# Patient Record
Sex: Male | Born: 1950
Health system: Southern US, Community
[De-identification: ages and names within clinical notes are randomized; demographics above are authoritative.]

## PROBLEM LIST (undated history)

## (undated) DIAGNOSIS — IMO0002 Reserved for concepts with insufficient information to code with codable children: Secondary | ICD-10-CM

## (undated) DIAGNOSIS — H9191 Unspecified hearing loss, right ear: Secondary | ICD-10-CM

## (undated) DIAGNOSIS — E119 Type 2 diabetes mellitus without complications: Secondary | ICD-10-CM

## (undated) DIAGNOSIS — I10 Essential (primary) hypertension: Secondary | ICD-10-CM

## (undated) DIAGNOSIS — J309 Allergic rhinitis, unspecified: Secondary | ICD-10-CM

## (undated) DIAGNOSIS — M25561 Pain in right knee: Secondary | ICD-10-CM

## (undated) DIAGNOSIS — E041 Nontoxic single thyroid nodule: Secondary | ICD-10-CM

## (undated) DIAGNOSIS — K219 Gastro-esophageal reflux disease without esophagitis: Secondary | ICD-10-CM

## (undated) DIAGNOSIS — Z9289 Personal history of other medical treatment: Secondary | ICD-10-CM

## (undated) DIAGNOSIS — M25562 Pain in left knee: Secondary | ICD-10-CM

## (undated) DIAGNOSIS — M545 Low back pain, unspecified: Secondary | ICD-10-CM

## (undated) DIAGNOSIS — I720 Aneurysm of carotid artery: Secondary | ICD-10-CM

## (undated) DIAGNOSIS — G4733 Obstructive sleep apnea (adult) (pediatric): Secondary | ICD-10-CM

## (undated) HISTORY — DX: Reserved for concepts with insufficient information to code with codable children: IMO0002

## (undated) HISTORY — PX: OTHER SURGICAL HISTORY: SHX169

## (undated) HISTORY — PX: KNEE ARTHROSCOPY: SUR90

## (undated) HISTORY — DX: Type 2 diabetes mellitus without complications: E11.9

## (undated) HISTORY — DX: Nontoxic single thyroid nodule: E04.1

## (undated) HISTORY — DX: Allergic rhinitis, unspecified: J30.9

## (undated) HISTORY — PX: CARDIAC CATHETERIZATION: SHX172

## (undated) HISTORY — DX: Morbid (severe) obesity due to excess calories: E66.01

## (undated) HISTORY — DX: Essential (primary) hypertension: I10

## (undated) HISTORY — DX: Low back pain: M54.5

## (undated) HISTORY — PX: APPENDECTOMY: SHX54

## (undated) HISTORY — PX: NASAL SEPTOPLASTY W/ TURBINOPLASTY: SHX2070

## (undated) HISTORY — DX: Personal history of other medical treatment: Z92.89

## (undated) HISTORY — DX: Pain in right knee: M25.562

## (undated) HISTORY — DX: Pain in right knee: M25.561

## (undated) HISTORY — DX: Low back pain, unspecified: M54.50

## (undated) HISTORY — DX: Obstructive sleep apnea (adult) (pediatric): G47.33

---

## 1998-04-29 ENCOUNTER — Ambulatory Visit (HOSPITAL_BASED_OUTPATIENT_CLINIC_OR_DEPARTMENT_OTHER): Admission: RE | Admit: 1998-04-29 | Discharge: 1998-04-29 | Payer: Self-pay | Admitting: Orthopedic Surgery

## 2000-05-13 ENCOUNTER — Encounter (INDEPENDENT_AMBULATORY_CARE_PROVIDER_SITE_OTHER): Payer: Self-pay | Admitting: Specialist

## 2000-05-13 ENCOUNTER — Other Ambulatory Visit: Admission: RE | Admit: 2000-05-13 | Discharge: 2000-05-13 | Payer: Self-pay | Admitting: Gastroenterology

## 2000-11-21 ENCOUNTER — Ambulatory Visit (HOSPITAL_BASED_OUTPATIENT_CLINIC_OR_DEPARTMENT_OTHER): Admission: RE | Admit: 2000-11-21 | Discharge: 2000-11-21 | Payer: Self-pay | Admitting: Internal Medicine

## 2001-04-06 ENCOUNTER — Emergency Department (HOSPITAL_COMMUNITY): Admission: EM | Admit: 2001-04-06 | Discharge: 2001-04-06 | Payer: Self-pay | Admitting: *Deleted

## 2002-08-21 ENCOUNTER — Ambulatory Visit (HOSPITAL_COMMUNITY): Admission: RE | Admit: 2002-08-21 | Discharge: 2002-08-21 | Payer: Self-pay | Admitting: Cardiology

## 2002-08-21 ENCOUNTER — Encounter: Payer: Self-pay | Admitting: Cardiology

## 2005-03-11 ENCOUNTER — Ambulatory Visit: Payer: Self-pay | Admitting: Internal Medicine

## 2005-08-06 ENCOUNTER — Ambulatory Visit: Payer: Self-pay | Admitting: Orthopedic Surgery

## 2005-12-09 ENCOUNTER — Ambulatory Visit: Payer: Self-pay | Admitting: Orthopedic Surgery

## 2005-12-18 ENCOUNTER — Ambulatory Visit: Payer: Self-pay | Admitting: Orthopedic Surgery

## 2005-12-18 ENCOUNTER — Ambulatory Visit (HOSPITAL_COMMUNITY): Admission: RE | Admit: 2005-12-18 | Discharge: 2005-12-18 | Payer: Self-pay | Admitting: Orthopedic Surgery

## 2005-12-21 ENCOUNTER — Ambulatory Visit: Payer: Self-pay | Admitting: Orthopedic Surgery

## 2005-12-22 ENCOUNTER — Encounter (HOSPITAL_COMMUNITY): Admission: RE | Admit: 2005-12-22 | Discharge: 2006-01-21 | Payer: Self-pay | Admitting: Orthopedic Surgery

## 2006-01-11 ENCOUNTER — Ambulatory Visit: Payer: Self-pay | Admitting: Orthopedic Surgery

## 2006-03-03 ENCOUNTER — Ambulatory Visit: Payer: Self-pay | Admitting: Internal Medicine

## 2006-04-07 ENCOUNTER — Ambulatory Visit: Payer: Self-pay | Admitting: Orthopedic Surgery

## 2006-06-28 ENCOUNTER — Ambulatory Visit: Payer: Self-pay | Admitting: Orthopedic Surgery

## 2007-06-28 ENCOUNTER — Ambulatory Visit: Payer: Self-pay | Admitting: Internal Medicine

## 2007-12-14 ENCOUNTER — Ambulatory Visit (HOSPITAL_COMMUNITY): Admission: RE | Admit: 2007-12-14 | Discharge: 2007-12-14 | Payer: Self-pay | Admitting: Family Medicine

## 2008-04-30 ENCOUNTER — Telehealth (INDEPENDENT_AMBULATORY_CARE_PROVIDER_SITE_OTHER): Payer: Self-pay | Admitting: *Deleted

## 2008-06-25 DIAGNOSIS — G4733 Obstructive sleep apnea (adult) (pediatric): Secondary | ICD-10-CM | POA: Insufficient documentation

## 2008-06-25 DIAGNOSIS — J309 Allergic rhinitis, unspecified: Secondary | ICD-10-CM

## 2008-06-25 DIAGNOSIS — I10 Essential (primary) hypertension: Secondary | ICD-10-CM | POA: Insufficient documentation

## 2008-06-25 DIAGNOSIS — M549 Dorsalgia, unspecified: Secondary | ICD-10-CM | POA: Insufficient documentation

## 2008-06-26 ENCOUNTER — Ambulatory Visit: Payer: Self-pay | Admitting: Internal Medicine

## 2008-06-26 DIAGNOSIS — E119 Type 2 diabetes mellitus without complications: Secondary | ICD-10-CM

## 2008-06-26 HISTORY — DX: Type 2 diabetes mellitus without complications: E11.9

## 2008-08-30 ENCOUNTER — Ambulatory Visit (HOSPITAL_COMMUNITY): Admission: RE | Admit: 2008-08-30 | Discharge: 2008-08-30 | Payer: Self-pay | Admitting: Family Medicine

## 2009-04-09 ENCOUNTER — Ambulatory Visit (HOSPITAL_COMMUNITY): Admission: RE | Admit: 2009-04-09 | Discharge: 2009-04-09 | Payer: Self-pay | Admitting: General Surgery

## 2009-06-25 ENCOUNTER — Ambulatory Visit: Payer: Self-pay | Admitting: Internal Medicine

## 2009-07-24 ENCOUNTER — Encounter: Payer: Self-pay | Admitting: Orthopedic Surgery

## 2009-09-18 ENCOUNTER — Ambulatory Visit (HOSPITAL_COMMUNITY): Admission: RE | Admit: 2009-09-18 | Discharge: 2009-09-18 | Payer: Self-pay | Admitting: Family Medicine

## 2009-10-25 ENCOUNTER — Ambulatory Visit (HOSPITAL_COMMUNITY): Admission: RE | Admit: 2009-10-25 | Discharge: 2009-10-25 | Payer: Self-pay | Admitting: Family Medicine

## 2010-07-15 ENCOUNTER — Telehealth: Payer: Self-pay | Admitting: Internal Medicine

## 2010-07-29 ENCOUNTER — Ambulatory Visit: Payer: Self-pay | Admitting: Internal Medicine

## 2010-10-08 ENCOUNTER — Encounter: Payer: Self-pay | Admitting: Internal Medicine

## 2010-12-07 ENCOUNTER — Encounter: Payer: Self-pay | Admitting: Family Medicine

## 2010-12-18 NOTE — Assessment & Plan Note (Signed)
Summary: yearly/apc   Primary Provider/Referring Provider:  Joeseph Amor / [place]  CC:  yearly follow up visit-OSA "doing good"..  History of Present Illness: History of Present Illness: 06/26/08- 60 year old, morbidly obese man followed for allergic rhinitis, and obstructive sleep apnea.  Physically, limited by chronic back pain.  Allergy problems have been well controlled through this spring and summer so far.  He uses Nasalcrom everyday and misses. it if he runs out. CPAP is comfortable at 15 CW PE, but he asks about increasing the pressure.  Feels tired and somewhat short of breath through the day, but is told that he is not snoring.  06/25/09- Allergic rhinitis, OSA,  CPAP at 16 works well, used all night every night. Denies daytime sleepiness as long as he stays active. Sometimes difficult getting to sleep at night. Wife died of pulmonary fibrosis. He's trying a glass of wine at bedtime. We discussed alcohlol for sleep- he says he sleeps through the night. He lost some weight while she was sick, but has regained it. Colonoscopy this year uncomplicated. Continues nasalcrom nasal spray most days. Denies hives or dry cough pertinent to ACEI use.  July 29, 2010- Allergic rhinitis, OSA, DM Continues CPAP all night every night at 16 and with any naps. Noticed increased tiredness and discovered hose leak- not holding pressure. With that repaired he is back to sleeping well with less daytime tiredness. Has not lost weight. Rhinitis controlled with regular use of Nasalcrom and it has been sufficient over the last 5-7 years.    While here he felt nervous. Nurse checked CBG 54>> given grape juice with relief.   Preventive Screening-Counseling & Management  Alcohol-Tobacco     Smoking Status: never     Passive Smoke Exposure: yes  Current Medications (verified): 1)  Bayer Aspirin 325 Mg  Tabs (Aspirin) .... Take 1 By Mouth Once Daily 2)  Cpap .... Set At 16 (Ahc) 3)  Benazepril Hcl  40 Mg  Tabs (Benazepril Hcl) .... Take 1 By Mouth Once Daily 4)  Hydrochlorothiazide 25 Mg  Tabs (Hydrochlorothiazide) .... Take 1 By Mouth Once Daily 5)  Catapres 0.2 Mg  Tabs (Clonidine Hcl) .... Take 1 By Mouth Three Times A Day 6)  Norvasc 10 Mg  Tabs (Amlodipine Besylate) .... Take 1 By Mouth Once Daily 7)  Toprol Xl 50 Mg Xr24h-Tab (Metoprolol Succinate) .... Take 1 By Mouth Once Daily 8)  Glucotrol 10 Mg  Tabs (Glipizide) .... Take 1 By Mouth Two Times A Day 9)  Metformin Hcl 500 Mg  Tabs (Metformin Hcl) .... Take 2 By Mouth Two Times A Day 10)  Lantus 100 Unit/ml  Soln (Insulin Glargine) .... Inject 62 Units At Bedtime 11)  Xalatan 0.005 %  Soln (Latanoprost) .... Use As Directed 12)  Nasalcrom 5.2 Mg/act  Aers (Cromolyn Sodium) .... Use As Directed 13)  Hydrocodone-Acetaminophen 5-500 Mg  Tabs (Hydrocodone-Acetaminophen) .... Take As Directed As Needed 14)  Flomax 0.4 Mg  Xr24h-Cap (Tamsulosin Hcl) .... Take 1 By Mouth Once Daily  Allergies (verified): 1)  ! Sulfa  Past History:  Past Medical History: Last updated: 06/26/2008 Allergic Rhinitis Diabetes Hypertension Sleep Apnea Degenerative disk disease Morbid obesity  Past Surgical History: Last updated: 06/25/2009 nasal septoplasty Arthroscopy both knees Appendectomy  Family History: Last updated: 06/26/2008 Mother- living age 91;DM Father- deceased age 48; lung disease;DM Sister- living age 7;DM  Social History: Last updated: 06/25/2009 Non-Smoker Positive history of passive tobacco smoke exposure.  No exercise. Caffeine-1 cup day Widower  with 2 children  Risk Factors: Smoking Status: never (07/29/2010) Passive Smoke Exposure: yes (07/29/2010)  Social History: Smoking Status:  never  Review of Systems      See HPI  The patient denies anorexia, fever, weight loss, weight gain, vision loss, decreased hearing, hoarseness, chest pain, syncope, dyspnea on exertion, peripheral edema, prolonged cough,  headaches, hemoptysis, abdominal pain, melena, severe indigestion/heartburn, muscle weakness, suspicious skin lesions, and difficulty walking.    Vital Signs:  Patient profile:   60 year old male Weight:      344.38 pounds O2 Sat:      91 % on Room air Pulse rate:   92 / minute BP sitting:   150 / 88  (right arm) Cuff size:   large  Vitals Entered By: Clayborne Dana CMA (July 29, 2010 4:46 PM)  O2 Flow:  Room air CC: yearly follow up visit-OSA "doing good".   Physical Exam  Additional Exam:  General: A/Ox3; pleasant and cooperative, NAD, obese, sprawling SKIN: no rash, lesions NODES: no lymphadenopathy HEENT: [place]/AT, EOM- WNL, Conjuctivae- clear, PERRLA, TM-WNL, Nose- clear, Throat- clear and wnl, chewing gum. Mallampati IV NECK: Supple w/ fair ROM, JVD- none, normal carotid impulses w/o bruits Thyroid- normal to palpation CHEST: Clear to P&A HEART: RRR, no m/g/r heard ABDOMEN: Morbid obesity  AUQ:JFHL, nl pulses, no edema  NEURO: Grossly intact to observation      Impression & Recommendations:  Problem # 1:  OBSTRUCTIVE SLEEP APNEA (ICD-327.23)  Good compliance and symptom control with CPAP.  Weight  loss would obviously help, but prospects for this aren't promising. We will counsel, and continue CPAP at 16.  Problem # 2:  ALLERGIC RHINITIS (ICD-477.9)  Nasalcrom has been successful and sufficient. No seasonal flare noted so far.  His updated medication list for this problem includes:    Nasalcrom 5.2 Mg/act Aers (Cromolyn sodium) ..... Use as directed  Problem # 3:  DIABETES MELLITUS (ICD-250.00) Acute problem: Incidental hypoglycemia today as noted, CBG 54 with subjective nervousness relieved  by grape juice. He will get something to eat on way home.  His updated medication list for this problem includes:    Bayer Aspirin 325 Mg Tabs (Aspirin) .Marland Kitchen... Take 1 by mouth once daily    Glucotrol 10 Mg Tabs (Glipizide) .Marland Kitchen... Take 1 by mouth two times a day     Metformin Hcl 500 Mg Tabs (Metformin hcl) .Marland Kitchen... Take 2 by mouth two times a day    Lantus 100 Unit/ml Soln (Insulin glargine) ..... Inject 62 units at bedtime  Medications Added to Medication List This Visit: 1)  Toprol Xl 50 Mg Xr24h-tab (Metoprolol succinate) .... Take 1 by mouth once daily 2)  Lantus 100 Unit/ml Soln (Insulin glargine) .... Inject 62 units at bedtime  Other Orders: Est. Patient Level IV (45625)  Patient Instructions: 1)  Please schedule a follow-up appointment in 1 year. 2)  Be sure to get something to eat on your way home and be careful driving.Marland Kitchen 3)  Continue CPAP at 16- you have done well with this. Any weight loss would help your back, your sleep apnea and your diabetes.  4)  Flu vax 5)  CC DR Armandina Gemma

## 2010-12-18 NOTE — Letter (Signed)
Summary: Statement of Medical Necessity / [facility]  Statement of Medical Necessity / [facility]   Imported By: Rise Patience 10/16/2010 15:41:59  _____________________________________________________________________  External Attachment:    Type:   Image     Comment:   External Document

## 2010-12-18 NOTE — Progress Notes (Signed)
Summary: cpap supplies-ATC NA X 1  Phone Note Call from Patient Call back at Home Phone [phone]   Caller: Patient Call For: young Reason for Call: Talk to Nurse Summary of Call: pt needs a new hose for his cpap machine.  [facility] Initial call taken by: Zigmund Gottron,  July 15, 2010 3:39 PM  Follow-up for Phone Call        ATC pt- NA and no option to leave a msg, WCB. Tilden Dome  July 15, 2010 3:43 PM  Spoke with pt, he states that his cpap hose is leaking.  Requests order for a new one sent to [facility].  Has pending ov with CDY on 07/29/10.  Pls advise thanks Follow-up by: Tilden Dome,  July 15, 2010 3:52 PM  Additional Follow-up for Phone Call Additional follow up Details #1::        Called [place] and [facility] can provide cpap hose today, however, pt stated that he wanted to go tomorrow morning. Order faxed to [address] Pt advised to bring cpap in so that RT can check cpap to ensure that the hose is the problem. Pt aware. Rhonda Cobb  July 15, 2010 4:49 PM      Appended Document: Orders Update Will order CPAP hose  Order faxed to [facility] for CPAP Hose. Pt is aware of order. Phillips Grout  July 16, 2010 11:58 AM  Clinical Lists Changes  Orders: Added new Referral order of DME Referral (DME) - Signed

## 2011-02-02 DIAGNOSIS — Z9289 Personal history of other medical treatment: Secondary | ICD-10-CM

## 2011-02-02 HISTORY — DX: Personal history of other medical treatment: Z92.89

## 2011-03-31 NOTE — Assessment & Plan Note (Signed)
Colton Jackson                             PULMONARY OFFICE NOTE   NAME:Colton Jackson, Colton Jackson                       MRN:          [id number]  DATE:06/28/2007                            DOB:          31-Jan-1951    PROBLEM LIST:  1. Obstructive sleep apnea.  2. Morbid obesity.  3. Allergic rhinitis.  4. Diabetes.  5. Chronic back pain.  6. Hypertension.   HISTORY:  Wife comes with him and says he occasionally snores through  his CPAP 15-CWP but overall does pretty well.  He prefers Nasalcrom over  the counter to the nasal steroid sprays.  He is not having any cough  problems using benazepril.  And, he feels stable.   MEDICATIONS:  1. CPAP 15-CWP.  2. Aspirin 325 mg.  3. Benazepril 40 mg.  4. HCTZ 25 mg.  5. Clonidine 0.2 mg t.i.d.  6. Norvasc 10 mg.  7. Toprol XL.  8. Avandia 8 mg.  9. Glipizide ER 10 mg b.i.d.  10.Metformin 500 mg x2 b.i.d.  11.Lantus.  12.Xalatan eye drops.  13.Nasalcrom.  14.Hydrocodone occasional.   DRUG INTOLERANT:  SULFA DRUGS.   OBJECTIVE:  VITAL SIGNS:  Weight 353 pounds, BP 138/72, pulse 72, room  air saturation 95%.  GENERAL:  He is morbidly obese.  CHEST:  Clear.  There is no stridor.  HEART:  Sounds are regular without murmur.   IMPRESSION:  1. Obstructive sleep apnea adequately controlled with CPAP at 15-CWP      but he would do enormously better if he could loose weight.  2. He does have some rhinitis and Nasalcrom seems adequate.   PLAN:  1. CPAP supplies were renewed.  He will continue pressure at 15.  2. Schedule return 1 year, earlier p.r.n.     Clinton D. Annamaria Boots, MD, [facility] Flood, FACP  Electronically Signed    CDY/MedQ  DD: 07/02/2007  DT: 07/03/2007  Job #: [id number]   cc:   Bonne Dolores, M.D.

## 2011-03-31 NOTE — H&P (Signed)
NAME:  Colton Jackson, Colton Jackson                ACCOUNT NO.:  [id number]   MEDICAL RECORD NO.:  [id number]           PATIENT TYPE:  AMB   LOCATION:  DAY                           FACILITY:  [facility]   PHYSICIAN:  Jamesetta So, M.D.  DATE OF BIRTH:  06-18-1951   DATE OF ADMISSION:  DATE OF DISCHARGE:  LH                              HISTORY & PHYSICAL   CHIEF COMPLAINT:  Need for screening colonoscopy.   HISTORY OF PRESENT ILLNESS:  The patient is a 60 year old black male who  is referred for endoscopic evaluation.  He needs colonoscopy for  screening purposes.  No abdominal pain, weight loss, nausea, vomiting,  diarrhea, constipation, melena, or hematochezia have been noted.  He has  never had a colonoscopy.  There is no family history of colon carcinoma.   PAST MEDICAL HISTORY:  Hypertension, insulin-dependent diabetes  mellitus, and sleep apnea.   PAST SURGICAL HISTORY:  Unremarkable.   CURRENT MEDICATIONS:  A blood pressure pill, insulin, and CPAP machine.   ALLERGIES:  No known drug allergies.   REVIEW OF SYSTEMS:  Noncontributory.   PHYSICAL EXAMINATION:  GENERAL:  The patient is a well-developed, well-  nourished black male in no acute distress.  LUNGS:  Clear to auscultation with equal breath sounds bilaterally.  HEART:  Regular rate and rhythm without S3, S4, murmurs.  ABDOMEN:  Soft, nontender and nondistended.  No hepatosplenomegaly or  masses are noted.  RECTAL:  Deferred to the procedure.   IMPRESSION:  Need for screening colonoscopy.   PLAN:  The patient is scheduled for a colonoscopy on Apr 09, 2009.  The  risks and benefits of the procedure including bleeding and perforation  were fully explained to the patient, I gave informed consent.      Jamesetta So, M.D.  Electronically Signed     MAJ/MEDQ  D:  04/04/2009  T:  04/05/2009  Job:  [id number]   cc:   Bonne Dolores, M.D.  Fax: [phone]   Short Stay, [facility]

## 2011-04-03 NOTE — Op Note (Signed)
NAME:  Colton Jackson, Colton Jackson                ACCOUNT NO.:  [id number]   MEDICAL RECORD NO.:  [id number]          PATIENT TYPE:  AMB   LOCATION:  DAY                           FACILITY:  [facility]   PHYSICIAN:  Carole Civil, M.D.DATE OF BIRTH:  1951-11-11   DATE OF PROCEDURE:  12/18/2005  DATE OF DISCHARGE:                                 OPERATIVE REPORT   INDICATIONS:  A 60 year old male with right knee pain, complains of giving  away of the knee with medial compartment pain.  Presents for arthroscopy,  medial meniscectomy.  Understands the risks and benefits of the procedure  including the option of not doing any surgery.   PREOPERATIVE DIAGNOSIS:  Torn medial meniscus, osteoarthritis of the right  knee.   POSTOPERATIVE DIAGNOSIS:  Torn medial meniscus, osteoarthritis of the right  knee.   PROCEDURES:  1.  Arthroscopy, right knee.  2.  Partial medial meniscectomy.   OPERATIVE FINDINGS:  Torn medial meniscus.  Mild degenerative changes  throughout the knee, grade 1.   SURGEON:  Carole Civil, M.D., no assistants.   ANESTHETIC:  Spinal.   No specimens.  No pathology.  No blood loss.  No complications.  Counts  reported as correct per nurse.  The patient went to PACU after the procedure  in good condition.   [name] was identified in preop holding area as Colton Jackson.  His  right knee was marked for surgery, countersigned by the surgeon.  His  physical was updated.  Antibiotics were started.  He is taken to the  operating room for spinal anesthetic.  After successful spinal anesthesia,  his right leg was prepped with sterile technique, after which a time-out was  taken and completed as required.   A two-incision arthroscopy technique was used.  A diagnostic arthroscopy was  performed.  The patellofemoral compartment looked reasonably good, as did  the lateral compartment.  There was some softening and fibrillation of the  cartilage in both compartments, but no loose  bodies were found noted.  No  denuded cartilage was noted.   On the medial compartment there was a tear of the posterior horn of the  medial meniscus.  Mild skin changes, grade 1, of the tibial plateau and  medial femoral condyle.   Using combination of duckbill forceps and motorized shaver and a 50-degree  ArthroCare wand, the meniscus tear was resected and balanced until a stable  rim was noted.   The knee was irrigated.  Its portals were closed with the 3-0 nylon and the  knee was injected with 15 mL of Marcaine.  A sterile dressing and Cryo/Cuff  were applied.  The patient was taken to the recovery room in stable  condition.   The patient will start therapy on Tuesday, follow with me on Monday.  He is  discharged on Lorcet Plus one q4.h., #60, two refills, and he is also put on  Coumadin.  I did not use Lovenox because he had a spinal.  He is on Coumadin  5 mg today, 2.5 milligrams one a day starting Saturday, December 19, 2005,  and continuing for 14 days.      Carole Civil, M.D.  Electronically Signed     SEH/MEDQ  D:  12/18/2005  T:  12/18/2005  Job:  [id number]

## 2011-04-03 NOTE — Cardiovascular Report (Signed)
NAME:  EVERSON, MOTT NO.:  [id number]   MEDICAL RECORD NO.:  [id number]                   PATIENT TYPE:  OIB   LOCATION:  2899                                 FACILITY:  [facility]   PHYSICIAN:  Bryson Dames, M.D.             DATE OF BIRTH:  July 01, 1951   DATE OF PROCEDURE:  08/21/2002  DATE OF DISCHARGE:  08/21/2002                              CARDIAC CATHETERIZATION   PROCEDURES PERFORMED:  1. Selective coronary angiography by Judkins technique.  2. Retrograde left heart catheterization.  3. Left ventricular angiography.  4. Abdominal aortography.  5. Right heart catheterization with thermodilution cardiac output     determinations.   COMPLICATIONS:  None.   ENTRY SITE:  Right femoral.   DYE USED:  Omnipaque.   PATIENT PROFILE:  The patient is a 60 year old African American, patient of  Drs. Bradsher and Cresenzo in [place], [place], who is obese has  diabetes, obstructive sleep apnea and recent symptoms suggestive of coronary  disease.  A Cardiolite stress test performed July 04, 2002, showed a left  ventricular ejection fraction of 51% with sole inferior and lateral wall  ischemia.  Cardiac catheterization was recommended and the patient presented  here today on an outpatient basis elective to [facility] short-stay  unit.   RESULTS:  PRESSURES:  The right atrial mean pressure was 19, right ventricle  pressure was 00/86, end-diastolic pressure 24, pulmonary artery pressure was  60/30, mean of 35, pulmonary capillary wedge mean pressure was 23.  Cardiac  output 9.3, cardiac index 3.6.  Central aorta arterial saturation 94%,  pulmonary artery arterial saturation 73%.  Central aortic pressure was  155/95, mean of 125, left ventricular pressure 761/95, end-diastolic  pressure 35.  No aortic valve gradient by pullback technique.   ANGIOGRAPHIC RESULTS:  The left ventricle showed normal contractility  without wall motion  abnormalities. No evidence of LV thrombosis or mitral  regurgitation.  LV ejection fraction was qualitatively assessed to 55%.   Coronary angiography showed that the coronaries were normal.   The left main was very short and large.   The LAD coursed to the cardiac apex and gave rise to one very large diagonal  branch that was as big as the LAD itself. It arose just after the first  septal perforator branch.  No lesions were seen.   The left circumflex was nondominant.  The circumflex proximally gave rise to  one bifurcating obtuse marginal branch.  Both branches of the OM and  circumflex were normal.   The right coronary artery was large and dominant. No lesions were seen.   ABDOMINAL AORTOGRAPHY:  Abdominal aortography showed normal abdominal aorta,  normal renal arteries and normal common iliacs.   FINAL DIAGNOSES:  1. Angiographically patent coronary arteries and a right dominant system.  2. Normal left ventricular systolic function.  3. No evidence of aortic stenosis or mitral stenosis by  hemodynamic     criteria.  4. Right heart pressures compatible with moderate pulmonary hypertension,     also left ventricular end-diastolic pressure is high.   RECOMMENDATIONS:  Because of the patient's pulmonary artery pressures high,  a wedge pressure and high LV end-diastolic pressure, would recommend that  the patient be maintained on diuretics and a continuation of his vasodilator  agent, Norvasc should be continued.                                                  Bryson Dames, M.D.    WHG/MEDQ  D:  08/21/2002  T:  08/23/2002  Job:  [id number]   cc:   Bonne Dolores, MD  [address] [place] [address]  Fax: [phone]   Roque Lias, M.D.   Cardiac Catheterization Lab

## 2011-04-03 NOTE — Group Therapy Note (Signed)
NAME:  Colton Jackson, Colton Jackson                ACCOUNT NO.:  [id number]   MEDICAL RECORD NO.:  [id number]          PATIENT TYPE:  AMB   LOCATION:  DAY                           FACILITY:  [facility]   PHYSICIAN:  Angus G. Everette Rank, MD   DATE OF BIRTH:  September 19, 1951   DATE OF PROCEDURE:  12/19/2005  DATE OF DISCHARGE:                                   PROGRESS NOTE   SUBJECTIVE:  This patient has history of CVA and left-sided weakness.  His  condition remains stable.  He has had no recent problems.  He does have a  prior history of urinary tract infections, esophageal reflux and CHF with  diabetes mellitus.   OBJECTIVE:  VITAL SIGNS:  Blood pressure 132/66, respirations 20, pulse 79,  temperature 99.  HEART:  Regular rhythm.  LUNGS:  Clear to P&A.  NEUROLOGIC:  Left-sided weakness.   ASSESSMENT:  The patient has history of cerebrovascular accident, non-  insulin dependent diabetes mellitus and congestive heart failure.   PLAN:  Continue current regimen and obtain lab studies with BNP, hemoglobin  A1c and BMET.  Medication list was reviewed.  The patient is to continue  medications as prescribed.      Angus G. Everette Rank, MD  Electronically Signed     AGM/MEDQ  D:  12/19/2005  T:  12/19/2005  Job:  [id number]

## 2011-04-03 NOTE — H&P (Signed)
NAME:  Colton Jackson, Colton Jackson                ACCOUNT NO.:  [id number]   MEDICAL RECORD NO.:  [id number]          PATIENT TYPE:  AMB   LOCATION:  DAY                           FACILITY:  [facility]   PHYSICIAN:  Carole Civil, M.D.DATE OF BIRTH:  1951/08/24   DATE OF ADMISSION:  DATE OF DISCHARGE:  LH                                HISTORY & PHYSICAL   CHIEF COMPLAINT:  Right knee pain.   HISTORY OF PRESENT ILLNESS:  This is a 60 year old male who presented with  right knee pain. He previously had a right knee arthroscopy several years  ago. He had a prolonged rehabilitation afterwards.  At this time he  complaints of severe constant, aching pain in his right knee with occasional  giving way.  He has a lot of medial pain.  It is interfering with his  activities of daily living.   REVIEW OF SYSTEMS:  He is very large.  He has had shortness of breath and  some constipation in the past. He is diabetic.  He has had sone tinnitus,  sinusitis.  He has had sleep apnea, diabetes, hypertension.  He has had an  appendectomy.  He had surgery as stated.   ALLERGIES:  SULFA DRUGS   CURRENT MEDICATIONS:  He takes Glipizide, metformin, Avandia, insulin,  Norvasc, Toprol, Clonidine, hydrochlorothiazide, benazepril, aspirin,  meclizine, Ultracet, Clarinex, Alphagan.   FAMILY HISTORY:  Asthma, arthritis and cancer.   SOCIAL HISTORY:  He is married. He drives a tow truck. He does not smoke or  drink.   PHYSICAL EXAMINATION:  VITAL SIGNS:  His weight is 350. Pulse is 88.  Respiratory rate 20.  HEENT:  Shows a shortened bull-type neck with clear throat, eyes, ears and  nose.  NECK:  Supple but again short and has supple range of motion with no  restriction.  CHEST: Clear, barrel shaped.  HEART:  Rate and rhythm normal.  ABDOMEN:  Soft, protuberant, no masses.  EXTREMITIES:  Right lower extremity has a lot of medial compartment  tenderness with a little bit of a varus knee. He has maintained his  motion.  There is mild swelling.  NEUROVASCULAR:  Examination is intact.   DIAGNOSIS:  Osteoarthritis and torn medial meniscus right knee.   PLAN:  Arthroscopy right knee.      Carole Civil, M.D.  Electronically Signed     SEH/MEDQ  D:  12/17/2005  T:  12/17/2005  Job:  [id number]   cc:   [facility]  Fax: [phone]

## 2011-07-28 ENCOUNTER — Ambulatory Visit: Payer: Self-pay | Admitting: Internal Medicine

## 2011-08-12 ENCOUNTER — Encounter: Payer: Self-pay | Admitting: Internal Medicine

## 2011-08-17 ENCOUNTER — Encounter: Payer: Self-pay | Admitting: Internal Medicine

## 2011-08-17 ENCOUNTER — Ambulatory Visit (INDEPENDENT_AMBULATORY_CARE_PROVIDER_SITE_OTHER): Payer: Medicare Other | Admitting: Internal Medicine

## 2011-08-17 VITALS — BP 132/90 | HR 74 | Ht 69.0 in | Wt 341.8 lb

## 2011-08-17 DIAGNOSIS — G4733 Obstructive sleep apnea (adult) (pediatric): Secondary | ICD-10-CM

## 2011-08-17 DIAGNOSIS — Z23 Encounter for immunization: Secondary | ICD-10-CM

## 2011-08-17 NOTE — Assessment & Plan Note (Signed)
Good compliance and control with CPAP 16 I am concerned about desat with exertion and would like to know more about obesity hypoventilation, O2 during sleep. He got tearful when we discussed O2, saying wife died of pulmonary fibrosis.

## 2011-08-17 NOTE — Progress Notes (Signed)
HPI- 08/17/11-60 year old never smoker followed for Allergic rhinitis, OSA, morbid obesity, obesity/ hypoventilation, complicated by back pain, DM, HBP. His wife died of lung disease(sounds like pulmonary fibrosis) after being on oxygen for 4 years. He felt threatened by my suggestion that we check home oximetry. He had cardiac workup it told him his heart was mildly enlarged. He has been able to be more active after surgery that has helped his back pain. Now he realizes how short of breath he is with exertion and is finding that knee pain is limiting activity. He asks about general anesthesia risk for knee surgery if he should have that. His back surgery and prior knee surgeries were done with local anesthetic blocks. Uses CPAP all night every night and with naps. Pressure is still at 16/[facility]. He gets Programmer, applications.  ROS Constitutional:   No-   weight loss, night sweats, fevers, chills, fatigue, lassitude. HEENT:   No-  headaches, difficulty swallowing, tooth/dental problems, sore throat,       No-  sneezing, itching, ear ache, nasal congestion, post nasal drip,  CV:  No-   chest pain, orthopnea, PND, swelling in lower extremities, anasarca,dizziness, palpitations Resp: + shortness of breath with exertion or at rest.              No-   productive cough,  No non-productive cough,  No-  coughing up of blood.              No-   change in color of mucus.  No- wheezing.   Skin: No-   rash or lesions. GI:  No-   heartburn, indigestion, abdominal pain, nausea, vomiting, diarrhea,                 change in bowel habits, loss of appetite GU: No-   dysuria, change in color of urine, no urgency or frequency.  No- flank pain. MS:  +   joint pain or swelling.  No- decreased range of motion.  + back pain. Neuro-  Psych:  No- change in mood or affect.  +depression or anxiety.  No memory loss.

## 2011-08-17 NOTE — Patient Instructions (Addendum)
[facility]  [facility]- rest, exertion and sleep (on CPAP) all to be done on room air.  Dx OSA     Order- PFT- dx OSA

## 2011-08-18 NOTE — Assessment & Plan Note (Signed)
Body habitus is consistent with obesity hypoventilation syndrome and this certainly may contribute to exertional oxygen desaturation. We're getting home oximetry to evaluate.

## 2011-08-20 ENCOUNTER — Telehealth: Payer: Self-pay | Admitting: Internal Medicine

## 2011-08-20 NOTE — Telephone Encounter (Signed)
This was the number I was given by crystal.Colton Jackson

## 2011-08-20 NOTE — Telephone Encounter (Signed)
Katie, have you seen fax on this?

## 2011-08-20 NOTE — Telephone Encounter (Signed)
[employer] # above.  Line has been disconnected/no longer in service.  Colton Jackson, please advise of a correct # to call this company back.  Thanks.

## 2011-08-20 NOTE — Telephone Encounter (Signed)
Fax given to CY to review and advise.

## 2011-08-26 NOTE — Telephone Encounter (Signed)
Colton Jackson has papers on his cart and is aware of this message.

## 2011-08-26 NOTE — Telephone Encounter (Signed)
Katie, do you know if this has been taken care of yet?  Pls advise.  Thanks!

## 2011-11-16 ENCOUNTER — Encounter: Payer: Self-pay | Admitting: Internal Medicine

## 2011-11-27 DIAGNOSIS — H4011X0 Primary open-angle glaucoma, stage unspecified: Secondary | ICD-10-CM | POA: Diagnosis not present

## 2011-12-18 ENCOUNTER — Encounter: Payer: Self-pay | Admitting: Internal Medicine

## 2011-12-18 ENCOUNTER — Ambulatory Visit (INDEPENDENT_AMBULATORY_CARE_PROVIDER_SITE_OTHER): Payer: Medicare Other | Admitting: Internal Medicine

## 2011-12-18 VITALS — BP 122/80 | HR 71 | Ht 68.0 in | Wt 349.0 lb

## 2011-12-18 DIAGNOSIS — E662 Morbid (severe) obesity with alveolar hypoventilation: Secondary | ICD-10-CM | POA: Diagnosis not present

## 2011-12-18 DIAGNOSIS — IMO0002 Reserved for concepts with insufficient information to code with codable children: Secondary | ICD-10-CM | POA: Diagnosis not present

## 2011-12-18 DIAGNOSIS — G4733 Obstructive sleep apnea (adult) (pediatric): Secondary | ICD-10-CM

## 2011-12-18 NOTE — Progress Notes (Signed)
HPI- 08/17/11-61 year old never smoker followed for Allergic rhinitis, OSA, morbid obesity, obesity/ hypoventilation, complicated by back pain, DM, HBP. His wife died of lung disease(sounds like pulmonary fibrosis) after being on oxygen for 4 years. He felt threatened by my suggestion that we check home oximetry. He had cardiac workup it told him his heart was mildly enlarged. He has been able to be more active after surgery that has helped his back pain. Now he realizes how short of breath he is with exertion and is finding that knee pain is limiting activity. He asks about general anesthesia risk for knee surgery if he should have that. His back surgery and prior knee surgeries were done with local anesthetic blocks. Uses CPAP all night every night and with naps. Pressure is still at 16/[facility]. He gets Programmer, applications.  10/16/12- -61 year old never smoker followed for Allergic rhinitis, OSA, morbid obesity, obesity/ hypoventilation, complicated by back pain, DM, HBP. Had a flulike illness which has resolved. We have not heard any oximetry was at its and I suspect he didn't complete that. She continues fully compliant with CPAP. Activity is limited by obesity and chronic back pain. PFT- 12/18/11-moderate restriction total lung capacity 68% of predicted, Tylenol reduction of flow volume FEV1/FVC 0.80 with slight response to bronchodilator in small airway flows. Diffusion moderately reduced at 62% of predicted.  ROS-see HPI Constitutional:   No-   weight loss, night sweats, fevers, chills, fatigue, lassitude. HEENT:   No-  headaches, difficulty swallowing, tooth/dental problems, sore throat,       No-  sneezing, itching, ear ache, nasal congestion, post nasal drip,  CV:  No-   chest pain, orthopnea, PND, swelling in lower extremities, anasarca,dizziness, palpitations Resp: + shortness of breath with exertion or at rest.              No-   productive cough,  No non-productive cough,  No-   coughing up of blood.              No-   change in color of mucus.  No- wheezing.   Skin: No-   rash or lesions. GI:  No-   heartburn, indigestion, abdominal pain, nausea, vomiting, diarrhea,                 change in bowel habits, loss of appetite GU: No-   dysuria, change in color of urine, no urgency or frequency.  No- flank pain. MS:  +   joint pain or swelling.  + decreased range of motion.  + back pain. Neuro-  Psych:  No- change in mood or affect.  +depression or anxiety.  No memory loss.  OBJ- Physical Exam General- Alert, Oriented, Affect-appropriate, Distress- none acute, morbidly obese Skin- rash-none, lesions- none, excoriation- none Lymphadenopathy- none Head- atraumatic            Eyes- Gross vision intact, PERRLA, conjunctivae and secretions clear            Ears- Hearing, canals-normal            Nose- Clear, no-Septal dev, mucus, polyps, erosion, perforation             Throat- Mallampati IV , mucosa clear , drainage- none, tonsils- atrophic Neck- flexible , trachea midline, no stridor , thyroid nl, carotid no bruit Chest - symmetrical excursion , unlabored           Heart/CV- RRR , no murmur , no gallop  , no rub, nl s1 s2                           -  JVD- none , edema- none, stasis changes- none, varices- none           Lung- clear to P&A, shallow, wheeze- none, cough- none , dullness-none, rub- none           Chest wall-  Abd- Br/ Gen/ Rectal- Not done, not indicated Extrem- cyanosis- none, clubbing, none, atrophy- none, strength- nl Neuro- grossly intact to observation

## 2011-12-18 NOTE — Progress Notes (Signed)
PFT done today. 

## 2011-12-18 NOTE — Patient Instructions (Signed)
Weight loss will make more difference for your health than anything   Order- [facility]- Home oximetry on room air    Sleep wearing CPAP, rest, exertion  Continue CPAP 16

## 2011-12-21 ENCOUNTER — Encounter: Payer: Self-pay | Admitting: Internal Medicine

## 2011-12-21 DIAGNOSIS — E662 Morbid (severe) obesity with alveolar hypoventilation: Secondary | ICD-10-CM | POA: Insufficient documentation

## 2011-12-21 DIAGNOSIS — IMO0002 Reserved for concepts with insufficient information to code with codable children: Secondary | ICD-10-CM | POA: Insufficient documentation

## 2011-12-21 NOTE — Assessment & Plan Note (Signed)
His pulmonary function pattern and body habitus are quite consistent with obesity hypoventilation. At that explain this and if she says weight loss, made difficult by his back pain which limits activity.

## 2011-12-21 NOTE — Assessment & Plan Note (Signed)
Good compliance and control. He prefers an Psychologist, sport and exercise which he gets on line.Marland Kitchen

## 2011-12-31 DIAGNOSIS — R0602 Shortness of breath: Secondary | ICD-10-CM | POA: Diagnosis not present

## 2012-01-11 DIAGNOSIS — I10 Essential (primary) hypertension: Secondary | ICD-10-CM | POA: Diagnosis not present

## 2012-01-11 DIAGNOSIS — E119 Type 2 diabetes mellitus without complications: Secondary | ICD-10-CM | POA: Diagnosis not present

## 2012-01-28 ENCOUNTER — Encounter: Payer: Self-pay | Admitting: Internal Medicine

## 2012-01-28 DIAGNOSIS — E782 Mixed hyperlipidemia: Secondary | ICD-10-CM | POA: Diagnosis not present

## 2012-01-28 DIAGNOSIS — E669 Obesity, unspecified: Secondary | ICD-10-CM | POA: Diagnosis not present

## 2012-01-28 DIAGNOSIS — G4733 Obstructive sleep apnea (adult) (pediatric): Secondary | ICD-10-CM | POA: Diagnosis not present

## 2012-01-28 DIAGNOSIS — I10 Essential (primary) hypertension: Secondary | ICD-10-CM | POA: Diagnosis not present

## 2012-02-02 DIAGNOSIS — I10 Essential (primary) hypertension: Secondary | ICD-10-CM | POA: Diagnosis not present

## 2012-02-02 DIAGNOSIS — E119 Type 2 diabetes mellitus without complications: Secondary | ICD-10-CM | POA: Diagnosis not present

## 2012-02-02 DIAGNOSIS — E785 Hyperlipidemia, unspecified: Secondary | ICD-10-CM | POA: Diagnosis not present

## 2012-02-02 DIAGNOSIS — Z6841 Body Mass Index (BMI) 40.0 and over, adult: Secondary | ICD-10-CM | POA: Diagnosis not present

## 2012-02-18 DIAGNOSIS — E11339 Type 2 diabetes mellitus with moderate nonproliferative diabetic retinopathy without macular edema: Secondary | ICD-10-CM | POA: Diagnosis not present

## 2012-02-18 DIAGNOSIS — H4011X0 Primary open-angle glaucoma, stage unspecified: Secondary | ICD-10-CM | POA: Diagnosis not present

## 2012-02-18 DIAGNOSIS — E109 Type 1 diabetes mellitus without complications: Secondary | ICD-10-CM | POA: Diagnosis not present

## 2012-05-18 DIAGNOSIS — E119 Type 2 diabetes mellitus without complications: Secondary | ICD-10-CM | POA: Diagnosis not present

## 2012-05-18 DIAGNOSIS — E785 Hyperlipidemia, unspecified: Secondary | ICD-10-CM | POA: Diagnosis not present

## 2012-05-18 DIAGNOSIS — E669 Obesity, unspecified: Secondary | ICD-10-CM | POA: Diagnosis not present

## 2012-05-18 DIAGNOSIS — Z6841 Body Mass Index (BMI) 40.0 and over, adult: Secondary | ICD-10-CM | POA: Diagnosis not present

## 2012-06-17 ENCOUNTER — Ambulatory Visit (INDEPENDENT_AMBULATORY_CARE_PROVIDER_SITE_OTHER): Payer: Medicare Other | Admitting: Internal Medicine

## 2012-06-17 ENCOUNTER — Encounter: Payer: Self-pay | Admitting: Internal Medicine

## 2012-06-17 VITALS — BP 138/90 | HR 74 | Ht 68.0 in | Wt 332.2 lb

## 2012-06-17 DIAGNOSIS — G4733 Obstructive sleep apnea (adult) (pediatric): Secondary | ICD-10-CM | POA: Diagnosis not present

## 2012-06-17 DIAGNOSIS — E662 Morbid (severe) obesity with alveolar hypoventilation: Secondary | ICD-10-CM | POA: Diagnosis not present

## 2012-06-17 NOTE — Progress Notes (Signed)
HPI- 08/17/11-61 year old never smoker followed for Allergic rhinitis, OSA, morbid obesity, obesity/ hypoventilation, complicated by back pain, DM, HBP. His wife died of lung disease(sounds like pulmonary fibrosis) after being on oxygen for 4 years. He felt threatened by my suggestion that we check home oximetry. He had cardiac workup it told him his heart was mildly enlarged. He has been able to be more active after surgery that has helped his back pain. Now he realizes how short of breath he is with exertion and is finding that knee pain is limiting activity. He asks about general anesthesia risk for knee surgery if he should have that. His back surgery and prior knee surgeries were done with local anesthetic blocks. Uses CPAP all night every night and with naps. Pressure is still at 16/[facility]. He gets Programmer, applications.  10/16/12- -61 year old never smoker followed for Allergic rhinitis, OSA, morbid obesity, obesity/ hypoventilation, complicated by back pain, DM, HBP. Had a flulike illness which has resolved. We have not heard any oximetry results and I suspect he didn't complete that. He continues fully compliant with CPAP. Activity is limited by obesity and chronic back pain. PFT- 12/18/11-moderate restriction total lung capacity 68% of predicted,  FEV1/FVC 0.80 with slight response to bronchodilator in small airway flows. Diffusion moderately reduced at 62% of predicted.  06/17/12- 58 year old never smoker followed for Allergic rhinitis, OSA, morbid obesity, obesity/ hypoventilation, complicated by back pain, DM, HBP. Wears CPAP 16/ [facility] every night for about 5-6 hours each night and during the day for naps about 2 hours. Pressure working well for patient as well. Gets supplies on line. Needs script for mask. Still limited by back pain and arthritis, but says doing some better. Never lost weight.  Reviewed home oximetry. 2/14/3- desat marginally with sleep on CPAP room air, and with  exertion on room air. He would rather work on weight / reluctant home O2. Little nasal congestion now.    ROS-see HPI Constitutional:   No-   weight loss, night sweats, fevers, chills, fatigue, lassitude. HEENT:   No-  headaches, difficulty swallowing, tooth/dental problems, sore throat,       No-  sneezing, itching, ear ache, nasal congestion, post nasal drip,  CV:  No-   chest pain, orthopnea, PND, swelling in lower extremities, anasarca, dizziness, palpitations Resp: + shortness of breath with exertion or at rest.              No-   productive cough,  Occasional non-productive cough,  No-  coughing up of blood.              No-   change in color of mucus.  Rare wheezing.   Skin: No-   rash or lesions. GI:  No-   heartburn, indigestion, abdominal pain, nausea, vomiting,  GU:  MS:  +   joint pain or swelling.   Neuro-  Psych:  No- change in mood or affect.  +depression or anxiety.  No memory loss.  OBJ- Physical Exam General- Alert, Oriented, Affect-appropriate, Distress- none acute, morbidly obese Skin- rash-none, lesions- none, excoriation- none Lymphadenopathy- none Head- atraumatic            Eyes- Gross vision intact, PERRLA, conjunctivae and secretions clear            Ears- Hearing, canals-normal            Nose- Clear, no-Septal dev, mucus, polyps, erosion, perforation             Throat- Mallampati  IV , mucosa clear , drainage- none, tonsils- atrophic Neck- flexible , trachea midline, no stridor , thyroid nl, carotid no bruit Chest - symmetrical excursion , unlabored           Heart/CV- RRR , no murmur , no gallop  , no rub, nl s1 s2                           - JVD- none , edema- none, stasis changes- none, varices- none           Lung- clear to P&A, shallow, wheeze- none, cough- none , dullness-none, rub- none           Chest wall-  Abd- Br/ Gen/ Rectal- Not done, not indicated Extrem- cyanosis- none, clubbing, none, atrophy- none, strength- nl Neuro- grossly intact to  observation

## 2012-06-17 NOTE — Assessment & Plan Note (Signed)
Borderline for needing supplemental oxygen. Just over 5 minutes desat on room air with CPAP. Some desat with exertion. He wants to wait.

## 2012-06-17 NOTE — Patient Instructions (Addendum)
Your oxygen scores are borderline. Weight loss will help this so we can avoid needing home oxygen.  Continue CPAP 16  Script for replacement CPAP mask of choice and supplies.   See if you can find CPAP.com as a source.

## 2012-06-17 NOTE — Assessment & Plan Note (Signed)
Good compliance and control.  Plan- script for use online to refill CPAP mask and supplies.

## 2012-06-17 NOTE — Assessment & Plan Note (Signed)
He has made no progress with this significant long-term problem.

## 2012-06-23 DIAGNOSIS — E785 Hyperlipidemia, unspecified: Secondary | ICD-10-CM | POA: Diagnosis not present

## 2012-06-30 DIAGNOSIS — H4011X0 Primary open-angle glaucoma, stage unspecified: Secondary | ICD-10-CM | POA: Diagnosis not present

## 2012-06-30 DIAGNOSIS — E1039 Type 1 diabetes mellitus with other diabetic ophthalmic complication: Secondary | ICD-10-CM | POA: Diagnosis not present

## 2012-06-30 DIAGNOSIS — E11339 Type 2 diabetes mellitus with moderate nonproliferative diabetic retinopathy without macular edema: Secondary | ICD-10-CM | POA: Diagnosis not present

## 2012-06-30 DIAGNOSIS — E11349 Type 2 diabetes mellitus with severe nonproliferative diabetic retinopathy without macular edema: Secondary | ICD-10-CM | POA: Diagnosis not present

## 2012-07-27 DIAGNOSIS — H25049 Posterior subcapsular polar age-related cataract, unspecified eye: Secondary | ICD-10-CM | POA: Diagnosis not present

## 2012-07-27 DIAGNOSIS — H2510 Age-related nuclear cataract, unspecified eye: Secondary | ICD-10-CM | POA: Diagnosis not present

## 2012-07-27 DIAGNOSIS — H4011X0 Primary open-angle glaucoma, stage unspecified: Secondary | ICD-10-CM | POA: Diagnosis not present

## 2012-07-27 DIAGNOSIS — H409 Unspecified glaucoma: Secondary | ICD-10-CM | POA: Diagnosis not present

## 2012-08-03 DIAGNOSIS — H2510 Age-related nuclear cataract, unspecified eye: Secondary | ICD-10-CM | POA: Diagnosis not present

## 2012-08-04 DIAGNOSIS — G4733 Obstructive sleep apnea (adult) (pediatric): Secondary | ICD-10-CM | POA: Diagnosis not present

## 2012-08-04 DIAGNOSIS — E669 Obesity, unspecified: Secondary | ICD-10-CM | POA: Diagnosis not present

## 2012-08-04 DIAGNOSIS — I10 Essential (primary) hypertension: Secondary | ICD-10-CM | POA: Diagnosis not present

## 2012-08-22 DIAGNOSIS — H5700 Unspecified anomaly of pupillary function: Secondary | ICD-10-CM | POA: Diagnosis not present

## 2012-08-22 DIAGNOSIS — H52209 Unspecified astigmatism, unspecified eye: Secondary | ICD-10-CM | POA: Diagnosis not present

## 2012-08-22 DIAGNOSIS — H269 Unspecified cataract: Secondary | ICD-10-CM | POA: Diagnosis not present

## 2012-08-22 DIAGNOSIS — H2510 Age-related nuclear cataract, unspecified eye: Secondary | ICD-10-CM | POA: Diagnosis not present

## 2012-08-29 DIAGNOSIS — H25049 Posterior subcapsular polar age-related cataract, unspecified eye: Secondary | ICD-10-CM | POA: Diagnosis not present

## 2012-08-29 DIAGNOSIS — H2510 Age-related nuclear cataract, unspecified eye: Secondary | ICD-10-CM | POA: Diagnosis not present

## 2012-10-10 DIAGNOSIS — Z125 Encounter for screening for malignant neoplasm of prostate: Secondary | ICD-10-CM | POA: Diagnosis not present

## 2012-10-10 DIAGNOSIS — E109 Type 1 diabetes mellitus without complications: Secondary | ICD-10-CM | POA: Diagnosis not present

## 2012-10-10 DIAGNOSIS — Z79899 Other long term (current) drug therapy: Secondary | ICD-10-CM | POA: Diagnosis not present

## 2012-10-10 DIAGNOSIS — Z23 Encounter for immunization: Secondary | ICD-10-CM | POA: Diagnosis not present

## 2012-10-10 DIAGNOSIS — Z6841 Body Mass Index (BMI) 40.0 and over, adult: Secondary | ICD-10-CM | POA: Diagnosis not present

## 2012-10-10 DIAGNOSIS — E785 Hyperlipidemia, unspecified: Secondary | ICD-10-CM | POA: Diagnosis not present

## 2012-10-10 DIAGNOSIS — E669 Obesity, unspecified: Secondary | ICD-10-CM | POA: Diagnosis not present

## 2012-10-10 DIAGNOSIS — I10 Essential (primary) hypertension: Secondary | ICD-10-CM | POA: Diagnosis not present

## 2012-10-24 DIAGNOSIS — E1139 Type 2 diabetes mellitus with other diabetic ophthalmic complication: Secondary | ICD-10-CM | POA: Diagnosis not present

## 2012-10-24 DIAGNOSIS — H4011X0 Primary open-angle glaucoma, stage unspecified: Secondary | ICD-10-CM | POA: Diagnosis not present

## 2012-10-24 DIAGNOSIS — E11339 Type 2 diabetes mellitus with moderate nonproliferative diabetic retinopathy without macular edema: Secondary | ICD-10-CM | POA: Diagnosis not present

## 2012-10-24 DIAGNOSIS — E11349 Type 2 diabetes mellitus with severe nonproliferative diabetic retinopathy without macular edema: Secondary | ICD-10-CM | POA: Diagnosis not present

## 2012-10-24 DIAGNOSIS — H43819 Vitreous degeneration, unspecified eye: Secondary | ICD-10-CM | POA: Diagnosis not present

## 2013-01-09 DIAGNOSIS — E785 Hyperlipidemia, unspecified: Secondary | ICD-10-CM | POA: Diagnosis not present

## 2013-01-16 DIAGNOSIS — E109 Type 1 diabetes mellitus without complications: Secondary | ICD-10-CM | POA: Diagnosis not present

## 2013-01-16 DIAGNOSIS — I10 Essential (primary) hypertension: Secondary | ICD-10-CM | POA: Diagnosis not present

## 2013-02-21 DIAGNOSIS — G4733 Obstructive sleep apnea (adult) (pediatric): Secondary | ICD-10-CM | POA: Diagnosis not present

## 2013-02-21 DIAGNOSIS — E782 Mixed hyperlipidemia: Secondary | ICD-10-CM | POA: Diagnosis not present

## 2013-02-21 DIAGNOSIS — E119 Type 2 diabetes mellitus without complications: Secondary | ICD-10-CM | POA: Diagnosis not present

## 2013-02-21 DIAGNOSIS — E669 Obesity, unspecified: Secondary | ICD-10-CM | POA: Diagnosis not present

## 2013-03-27 DIAGNOSIS — H4011X0 Primary open-angle glaucoma, stage unspecified: Secondary | ICD-10-CM | POA: Diagnosis not present

## 2013-03-27 DIAGNOSIS — E1039 Type 1 diabetes mellitus with other diabetic ophthalmic complication: Secondary | ICD-10-CM | POA: Diagnosis not present

## 2013-03-27 DIAGNOSIS — E11349 Type 2 diabetes mellitus with severe nonproliferative diabetic retinopathy without macular edema: Secondary | ICD-10-CM | POA: Diagnosis not present

## 2013-03-27 DIAGNOSIS — E11339 Type 2 diabetes mellitus with moderate nonproliferative diabetic retinopathy without macular edema: Secondary | ICD-10-CM | POA: Diagnosis not present

## 2013-03-27 DIAGNOSIS — H35049 Retinal micro-aneurysms, unspecified, unspecified eye: Secondary | ICD-10-CM | POA: Diagnosis not present

## 2013-05-08 DIAGNOSIS — E785 Hyperlipidemia, unspecified: Secondary | ICD-10-CM | POA: Diagnosis not present

## 2013-05-08 DIAGNOSIS — E109 Type 1 diabetes mellitus without complications: Secondary | ICD-10-CM | POA: Diagnosis not present

## 2013-05-08 DIAGNOSIS — Z6841 Body Mass Index (BMI) 40.0 and over, adult: Secondary | ICD-10-CM | POA: Diagnosis not present

## 2013-05-08 DIAGNOSIS — I10 Essential (primary) hypertension: Secondary | ICD-10-CM | POA: Diagnosis not present

## 2013-06-19 ENCOUNTER — Ambulatory Visit (INDEPENDENT_AMBULATORY_CARE_PROVIDER_SITE_OTHER): Payer: Medicare Other | Admitting: Internal Medicine

## 2013-06-19 ENCOUNTER — Encounter: Payer: Self-pay | Admitting: Internal Medicine

## 2013-06-19 VITALS — BP 132/60 | HR 89 | Ht 68.0 in | Wt 345.0 lb

## 2013-06-19 DIAGNOSIS — G4733 Obstructive sleep apnea (adult) (pediatric): Secondary | ICD-10-CM

## 2013-06-19 NOTE — Patient Instructions (Addendum)
Order- [facility]- CPAP increase pressure to 18, replacement mask of choice and supplies                          You can ask [facility] when you might be eligible for a new mask.  Keep trying to get your weight down. Take every opportunity to walk, to eat less and to eat healthy.

## 2013-06-19 NOTE — Progress Notes (Signed)
HPI- 08/17/11-62 year old never smoker followed for Allergic rhinitis, OSA, morbid obesity, obesity/ hypoventilation, complicated by back pain, DM, HBP. His wife died of lung disease(sounds like pulmonary fibrosis) after being on oxygen for 4 years. He felt threatened by my suggestion that we check home oximetry. He had cardiac workup it told him his heart was mildly enlarged. He has been able to be more active after surgery that has helped his back pain. Now he realizes how short of breath he is with exertion and is finding that knee pain is limiting activity. He asks about general anesthesia risk for knee surgery if he should have that. His back surgery and prior knee surgeries were done with local anesthetic blocks. Uses CPAP all night every night and with naps. Pressure is still at 16/[facility]. He gets Programmer, applications.  10/16/12- -62 year old never smoker followed for Allergic rhinitis, OSA, morbid obesity, obesity/ hypoventilation, complicated by back pain, DM, HBP. Had a flulike illness which has resolved. We have not heard any oximetry results and I suspect he didn't complete that. He continues fully compliant with CPAP. Activity is limited by obesity and chronic back pain. PFT- 12/18/11-moderate restriction total lung capacity 68% of predicted,  FEV1/FVC 0.80 with slight response to bronchodilator in small airway flows. Diffusion moderately reduced at 62% of predicted.  06/17/12- 72 year old never smoker followed for Allergic rhinitis, OSA, morbid obesity, obesity/ hypoventilation, complicated by back pain, DM, HBP. Wears CPAP 16/ [facility] every night for about 5-6 hours each night and during the day for naps about 2 hours. Pressure working well for patient as well. Gets supplies on line. Needs script for mask. Still limited by back pain and arthritis, but says doing some better. Never lost weight.  Reviewed home oximetry. 2/14/3- desat marginally with sleep on CPAP room air, and with  exertion on room air. He would rather work on weight / reluctant home O2. Little nasal congestion now.   06/19/13- 14 year old never smoker followed for Allergic rhinitis, OSA, morbid obesity, obesity/ hypoventilation, complicated by back pain, DM, HBP FOLLOWS FOR: wears CPAP 16/[facility] every night for about 8 hours; Going back to [facility] per patient. Not able to lose any weight. Activity remains limited by back pain. More daytime sleepiness and he asks about increasing the pressure. We discussed sleep hygiene. Putting his CPAP on sometimes in the daytime because of "no energy". He is reluctant to consider home oxygen because he thinks he hurt his wife died of pulmonary fibrosis  ROS-see HPI Constitutional:   No-   weight loss, night sweats, fevers, chills, +fatigue, lassitude. HEENT:   No-  headaches, difficulty swallowing, tooth/dental problems, sore throat,       No-  sneezing, itching, ear ache, nasal congestion, post nasal drip,  CV:  No-   chest pain, orthopnea, PND, swelling in lower extremities, anasarca, dizziness, palpitations Resp: + shortness of breath with exertion or at rest.              No-   productive cough,  Occasional non-productive cough,  No-  coughing up of blood.              No-   change in color of mucus.  Rare wheezing.   Skin: No-   rash or lesions. GI:  No-   heartburn, indigestion, abdominal pain, nausea, vomiting,  GU:  MS:  +   joint pain or swelling.   Neuro-  Psych:  No- change in mood or affect.  +depression or anxiety.  No  memory loss.  OBJ- Physical Exam General- Alert, Oriented, Affect-appropriate, Distress- none acute, +morbidly obese Skin- rash-none, lesions- none, excoriation- none Lymphadenopathy- none Head- atraumatic            Eyes- Gross vision intact, PERRLA, conjunctivae and secretions clear            Ears- Hearing, canals-normal            Nose- Clear, no-Septal dev, mucus, polyps, erosion, perforation             Throat- Mallampati IV ,  mucosa clear , drainage- none, tonsils- atrophic Neck- flexible , trachea midline, no stridor , thyroid nl, carotid no bruit Chest - symmetrical excursion , unlabored           Heart/CV- RRR , no murmur , no gallop  , no rub, nl s1 s2                           - JVD- none , edema- none, stasis changes- none, varices- none           Lung- clear to P&A, shallow, wheeze- none, cough- none , dullness-none, rub- none           Chest wall-  Abd- Br/ Gen/ Rectal- Not done, not indicated Extrem- cyanosis- none, clubbing, none, atrophy- none, strength- nl Neuro- grossly intact to observation

## 2013-06-27 NOTE — Assessment & Plan Note (Signed)
He has not made any meaningful effort to lose weight. Support services discussed.

## 2013-06-27 NOTE — Assessment & Plan Note (Signed)
He is motivated CPAP. He would like to try higher pressure which we discussed. Plan-increase from 16-18

## 2013-08-15 DIAGNOSIS — E1039 Type 1 diabetes mellitus with other diabetic ophthalmic complication: Secondary | ICD-10-CM | POA: Diagnosis not present

## 2013-08-15 DIAGNOSIS — H4011X0 Primary open-angle glaucoma, stage unspecified: Secondary | ICD-10-CM | POA: Diagnosis not present

## 2013-08-15 DIAGNOSIS — H35049 Retinal micro-aneurysms, unspecified, unspecified eye: Secondary | ICD-10-CM | POA: Diagnosis not present

## 2013-08-15 DIAGNOSIS — E11349 Type 2 diabetes mellitus with severe nonproliferative diabetic retinopathy without macular edema: Secondary | ICD-10-CM | POA: Diagnosis not present

## 2013-09-08 DIAGNOSIS — Z6841 Body Mass Index (BMI) 40.0 and over, adult: Secondary | ICD-10-CM | POA: Diagnosis not present

## 2013-09-08 DIAGNOSIS — E785 Hyperlipidemia, unspecified: Secondary | ICD-10-CM | POA: Diagnosis not present

## 2013-09-08 DIAGNOSIS — E119 Type 2 diabetes mellitus without complications: Secondary | ICD-10-CM | POA: Diagnosis not present

## 2013-09-08 DIAGNOSIS — I10 Essential (primary) hypertension: Secondary | ICD-10-CM | POA: Diagnosis not present

## 2013-10-11 DIAGNOSIS — Z6841 Body Mass Index (BMI) 40.0 and over, adult: Secondary | ICD-10-CM | POA: Diagnosis not present

## 2013-10-11 DIAGNOSIS — G8929 Other chronic pain: Secondary | ICD-10-CM | POA: Diagnosis not present

## 2013-10-11 DIAGNOSIS — Z23 Encounter for immunization: Secondary | ICD-10-CM | POA: Diagnosis not present

## 2013-10-30 DIAGNOSIS — G8929 Other chronic pain: Secondary | ICD-10-CM | POA: Diagnosis not present

## 2013-10-30 DIAGNOSIS — Z6841 Body Mass Index (BMI) 40.0 and over, adult: Secondary | ICD-10-CM | POA: Diagnosis not present

## 2013-10-30 DIAGNOSIS — R350 Frequency of micturition: Secondary | ICD-10-CM | POA: Diagnosis not present

## 2013-12-01 DIAGNOSIS — I10 Essential (primary) hypertension: Secondary | ICD-10-CM | POA: Diagnosis not present

## 2013-12-01 DIAGNOSIS — E785 Hyperlipidemia, unspecified: Secondary | ICD-10-CM | POA: Diagnosis not present

## 2013-12-01 DIAGNOSIS — E1069 Type 1 diabetes mellitus with other specified complication: Secondary | ICD-10-CM | POA: Diagnosis not present

## 2013-12-01 DIAGNOSIS — Z125 Encounter for screening for malignant neoplasm of prostate: Secondary | ICD-10-CM | POA: Diagnosis not present

## 2013-12-01 DIAGNOSIS — Z6841 Body Mass Index (BMI) 40.0 and over, adult: Secondary | ICD-10-CM | POA: Diagnosis not present

## 2013-12-01 DIAGNOSIS — E669 Obesity, unspecified: Secondary | ICD-10-CM | POA: Diagnosis not present

## 2013-12-29 DIAGNOSIS — Z6841 Body Mass Index (BMI) 40.0 and over, adult: Secondary | ICD-10-CM | POA: Diagnosis not present

## 2013-12-29 DIAGNOSIS — G8929 Other chronic pain: Secondary | ICD-10-CM | POA: Diagnosis not present

## 2014-01-31 DIAGNOSIS — Z6841 Body Mass Index (BMI) 40.0 and over, adult: Secondary | ICD-10-CM | POA: Diagnosis not present

## 2014-01-31 DIAGNOSIS — G8929 Other chronic pain: Secondary | ICD-10-CM | POA: Diagnosis not present

## 2014-02-13 DIAGNOSIS — E1039 Type 1 diabetes mellitus with other diabetic ophthalmic complication: Secondary | ICD-10-CM | POA: Diagnosis not present

## 2014-02-13 DIAGNOSIS — E11349 Type 2 diabetes mellitus with severe nonproliferative diabetic retinopathy without macular edema: Secondary | ICD-10-CM | POA: Diagnosis not present

## 2014-02-13 DIAGNOSIS — E11339 Type 2 diabetes mellitus with moderate nonproliferative diabetic retinopathy without macular edema: Secondary | ICD-10-CM | POA: Diagnosis not present

## 2014-05-14 DIAGNOSIS — Z6841 Body Mass Index (BMI) 40.0 and over, adult: Secondary | ICD-10-CM | POA: Diagnosis not present

## 2014-05-14 DIAGNOSIS — E785 Hyperlipidemia, unspecified: Secondary | ICD-10-CM | POA: Diagnosis not present

## 2014-05-14 DIAGNOSIS — E109 Type 1 diabetes mellitus without complications: Secondary | ICD-10-CM | POA: Diagnosis not present

## 2014-05-14 DIAGNOSIS — I10 Essential (primary) hypertension: Secondary | ICD-10-CM | POA: Diagnosis not present

## 2014-06-19 ENCOUNTER — Ambulatory Visit: Payer: Medicare Other | Admitting: Internal Medicine

## 2014-06-25 ENCOUNTER — Encounter: Payer: Self-pay | Admitting: Internal Medicine

## 2014-06-25 ENCOUNTER — Ambulatory Visit (INDEPENDENT_AMBULATORY_CARE_PROVIDER_SITE_OTHER): Payer: Medicare Other | Admitting: Internal Medicine

## 2014-06-25 VITALS — BP 128/66 | HR 77 | Ht 68.0 in | Wt 330.0 lb

## 2014-06-25 DIAGNOSIS — E662 Morbid (severe) obesity with alveolar hypoventilation: Secondary | ICD-10-CM

## 2014-06-25 DIAGNOSIS — G4733 Obstructive sleep apnea (adult) (pediatric): Secondary | ICD-10-CM

## 2014-06-25 NOTE — Progress Notes (Signed)
HPI- 08/17/11-63 year old never smoker followed for Allergic rhinitis, OSA, morbid obesity, obesity/ hypoventilation, complicated by back pain, DM, HBP. His wife died of lung disease(sounds like pulmonary fibrosis) after being on oxygen for 4 years. He felt threatened by my suggestion that we check home oximetry. He had cardiac workup it told him his heart was mildly enlarged. He has been able to be more active after surgery that has helped his back pain. Now he realizes how short of breath he is with exertion and is finding that knee pain is limiting activity. He asks about general anesthesia risk for knee surgery if he should have that. His back surgery and prior knee surgeries were done with local anesthetic blocks. Uses CPAP all night every night and with naps. Pressure is still at 16/[facility]. He gets Programmer, applications.  10/16/12- -63 year old never smoker followed for Allergic rhinitis, OSA, morbid obesity, obesity/ hypoventilation, complicated by back pain, DM, HBP. Had a flulike illness which has resolved. We have not heard any oximetry results and I suspect he didn't complete that. He continues fully compliant with CPAP. Activity is limited by obesity and chronic back pain. PFT- 12/18/11-moderate restriction total lung capacity 68% of predicted,  FEV1/FVC 0.80 with slight response to bronchodilator in small airway flows. Diffusion moderately reduced at 62% of predicted.  06/17/12- 63 year old never smoker followed for Allergic rhinitis, OSA, morbid obesity, obesity/ hypoventilation, complicated by back pain, DM, HBP. Wears CPAP 16/ [facility] every night for about 5-6 hours each night and during the day for naps about 2 hours. Pressure working well for patient as well. Gets supplies on line. Needs script for mask. Still limited by back pain and arthritis, but says doing some better. Never lost weight.  Reviewed home oximetry. 2/14/3- desat marginally with sleep on CPAP room air, and with  exertion on room air. He would rather work on weight / reluctant home O2. Little nasal congestion now.   06/19/13- 41 year old never smoker followed for Allergic rhinitis, OSA, morbid obesity, obesity/ hypoventilation, complicated by back pain, DM, HBP FOLLOWS FOR: wears CPAP 16/[facility] every night for about 8 hours; Going back to [facility] per patient. Not able to lose any weight. Activity remains limited by back pain. More daytime sleepiness and he asks about increasing the pressure. We discussed sleep hygiene. Putting his CPAP on sometimes in the daytime because of "no energy". He is reluctant to consider home oxygen because he thinks it helped his wife die of pulmonary fibrosis  06/25/14-57 year old never smoker followed for Allergic rhinitis, OSA, morbid obesity, obesity/ hypoventilation, complicated by back pain, DM, HBP FOLLOWS FOR: Wears cpap 16/ [facility] about 9-10 hours/day, broken up between day and night.  Pt denies pressure/mask supplies problems.   Pain meds for his back make him sleepy. Rhinitis controlled with interval use of Nasalcrom nasal spray  ROS-see HPI Constitutional:   No-   weight loss, night sweats, fevers, chills, +fatigue, lassitude. HEENT:   No-  headaches, difficulty swallowing, tooth/dental problems, sore throat,       No-  sneezing, itching, ear ache, nasal congestion, post nasal drip,  CV:  No-   chest pain, orthopnea, PND, swelling in lower extremities, anasarca, dizziness, palpitations Resp: + shortness of breath with exertion or at rest.              No-   productive cough,  Occasional non-productive cough,  No-  coughing up of blood.              No-  change in color of mucus.  Rare wheezing.   Skin: No-   rash or lesions. GI:  No-   heartburn, indigestion, abdominal pain, nausea, vomiting,  GU:  MS:  +   joint pain or swelling., + back pain Neuro-  Psych:  No- change in mood or affect.  +depression or anxiety.  No memory loss.  OBJ- Physical Exam General-  Alert, Oriented, Affect-appropriate, Distress- none acute, +morbidly obese Skin- rash-none, lesions- none, excoriation- none Lymphadenopathy- none Head- atraumatic            Eyes- Gross vision intact, PERRLA, conjunctivae and secretions clear            Ears- Hearing, canals-normal            Nose- Clear, no-Septal dev, mucus, polyps, erosion, perforation             Throat- Mallampati IV , mucosa clear , drainage- none, tonsils- atrophic Neck- flexible , trachea midline, no stridor , thyroid nl, carotid no bruit Chest - symmetrical excursion , unlabored           Heart/CV- RRR , no murmur , no gallop  , no rub, nl s1 s2                           - JVD- none , edema- none, stasis changes- none, varices- none           Lung- clear to P&A, shallow consistent with body habitus, wheeze- none, cough- none , dullness-none, rub- none           Chest wall-  Abd- Br/ Gen/ Rectal- Not done, not indicated Extrem- cyanosis- none, clubbing, none, atrophy- none, strength- nl Neuro- grossly intact to observation

## 2014-06-25 NOTE — Patient Instructions (Signed)
Order- [facility]- replacement for old CPAP machine, 16, replacement supplies, mask of choice and supplies   Dx OSA

## 2014-07-24 ENCOUNTER — Telehealth: Payer: Self-pay | Admitting: Internal Medicine

## 2014-07-24 DIAGNOSIS — G4733 Obstructive sleep apnea (adult) (pediatric): Secondary | ICD-10-CM

## 2014-07-24 NOTE — Telephone Encounter (Signed)
Colton Jersey do you have a copy of pt original sleep study too? If not chart will need to be ordered. thanks

## 2014-07-24 NOTE — Telephone Encounter (Signed)
Copy of sleep study not scanned in pt chart.  Colton Jackson had a copy and this has been faxed to [phone]. Nothing further needed

## 2014-07-27 NOTE — Telephone Encounter (Signed)
Spoke with Melissa with [facility].  Reports the results they received on Sep 8 are titration study; there was no diagnostic information attached.  They will need diagnostic information.  Melissa states pt advised [facility] he could not sleep without cpap the night of the study so only titration was done.  [facility] is needing a copy of the original sleep study pt had --  Believes this may have been around 1999.  If original sleep study results cannot be located, a home sleep study will be needed.    lmomtcb for pt - does he know where he had original sleep study done?

## 2014-07-27 NOTE — Telephone Encounter (Signed)
Per another phone msg from 07/24/14:  Inge Rise, CMA at 07/24/2014 12:51 PM     Status: Signed        Copy of sleep study not scanned in pt chart.  katiw had a copy and this has been faxed to [phone]. Nothing further needed   --------  lmomtcb for Melissa.  Was this the original sleep study that was faxed?

## 2014-07-30 NOTE — Telephone Encounter (Signed)
Split night ordered Left detailed message on Colton Jackson's voicemail informing her that [place] for a split night. Called spoke with patient and discussed the above with him.  Pt voiced his understanding and denied any further questions/concerns at this time.  Will sign off.

## 2014-07-30 NOTE — Telephone Encounter (Signed)
Ok to order spit protocol NPSG- dx OSA

## 2014-07-30 NOTE — Telephone Encounter (Signed)
Spoke with patient-states he original sleep study was done before 1999 at [facility]; pt is aware that his original sleep study will be considered to old and will need to have more recent one done for insurance. Pt would like to have sleep study done at lab due to the severity of his sleep apnea and is scared to have home sleep study.   CY, please advise if okay to order split night study for patient. Thanks.

## 2014-08-17 ENCOUNTER — Encounter: Payer: Self-pay | Admitting: Internal Medicine

## 2014-08-23 DIAGNOSIS — E119 Type 2 diabetes mellitus without complications: Secondary | ICD-10-CM | POA: Diagnosis not present

## 2014-08-23 DIAGNOSIS — Z23 Encounter for immunization: Secondary | ICD-10-CM | POA: Diagnosis not present

## 2014-08-23 DIAGNOSIS — Z6841 Body Mass Index (BMI) 40.0 and over, adult: Secondary | ICD-10-CM | POA: Diagnosis not present

## 2014-08-23 DIAGNOSIS — E782 Mixed hyperlipidemia: Secondary | ICD-10-CM | POA: Diagnosis not present

## 2014-08-23 DIAGNOSIS — I10 Essential (primary) hypertension: Secondary | ICD-10-CM | POA: Diagnosis not present

## 2014-08-23 DIAGNOSIS — H4010X0 Unspecified open-angle glaucoma, stage unspecified: Secondary | ICD-10-CM | POA: Diagnosis not present

## 2014-09-18 ENCOUNTER — Ambulatory Visit (HOSPITAL_BASED_OUTPATIENT_CLINIC_OR_DEPARTMENT_OTHER): Payer: Medicare Other | Attending: Internal Medicine | Admitting: Radiology

## 2014-09-18 VITALS — Ht 68.0 in | Wt 332.0 lb

## 2014-09-18 DIAGNOSIS — G4733 Obstructive sleep apnea (adult) (pediatric): Secondary | ICD-10-CM | POA: Diagnosis not present

## 2014-09-18 DIAGNOSIS — G4710 Hypersomnia, unspecified: Secondary | ICD-10-CM | POA: Diagnosis not present

## 2014-09-18 DIAGNOSIS — Z9989 Dependence on other enabling machines and devices: Secondary | ICD-10-CM

## 2014-09-22 DIAGNOSIS — G4733 Obstructive sleep apnea (adult) (pediatric): Secondary | ICD-10-CM | POA: Diagnosis not present

## 2014-09-22 NOTE — Sleep Study (Signed)
   NAME: Colton Jackson DATE OF BIRTH:  1951-08-30 MEDICAL RECORD NUMBER [id number]  LOCATION: [facility]  PHYSICIAN: YOUNG,CLINTON D  DATE OF STUDY: 09/18/2014  SLEEP STUDY TYPE: Nocturnal Polysomnogram               REFERRING PHYSICIAN: Baird Lyons D, MD  INDICATION FOR STUDY: Hypersomnia with sleep apnea  EPWORTH SLEEPINESS SCORE:  13/24 HEIGHT: _0  (172.7 cm)  WEIGHT: (!) 332 lb (150.594 kg)    Body mass index is 50.49 kg/(m^2).  NECK SIZE: 19 in.  MEDICATIONS: Charted for review  SLEEP ARCHITECTURE: Split study protocol. During the diagnostic phase, Total sleep time 123 min, sleep efficiency 80.4%. Stage 1 10.6%, Stage 2 48.8%, Stage 3 absent, REM 40.7% of total sleep time.Sleep latency 7.0 min, REM latency 30.5 min, awake after sleep onset 23 min, arousal index 27.3, Bedtime medication: Lorcet, meclizine, clonidine  RESPIRATORY DATA: Apnea/ Hypopnea Index (AHI) 72.7/ hr. 149 total events scored, including 78 obstructive apneas, 1 mixed apnea, 70 hypopneas. REM AHI 68.4/ hr. All events were while supine. CPAPtitration to 19 cwp, AHI 0/ hr wearing a large full-face mask.  OXYGEN DATA: Moderate snoring before CPAP with O2 desaturation to a nadir of 81% and mean saturation 88.4% on room air. After CPAP control, snoring was prevented and mean O2 saturation was 91.3% on room air.  CARDIAC DATA: NSR w/ PVCs  MOVEMENT/PARASOMNIA: No significant movement disturbance, bathroom x 1  IMPRESSION/ RECOMMENDATION:   1) Severe obstructive sleep apnea/ hypopnea syndrome, AHI 72.7/ hr, with supine events. REM AHI 68.4/ hr. Moderate snoring with O2 desaturation to a nadir of 81% on room air. 2) Successful CPAP titration to 19 cwp, AHI 0/ hr. He wore a large [employer] Simplus full face mask with heated humidifier. Snoring was prevented and mean oxygen saturation was 91.3% on room air.  Deneise Lever Diplomate, American Board of Sleep Medicine  ELECTRONICALLY  SIGNED ON:  09/22/2014, 9:42 AM [place] PH: [phone]   FX: [phone] [place]

## 2014-10-26 ENCOUNTER — Ambulatory Visit (INDEPENDENT_AMBULATORY_CARE_PROVIDER_SITE_OTHER): Payer: Medicare Other | Admitting: Urology

## 2014-10-26 DIAGNOSIS — N3941 Urge incontinence: Secondary | ICD-10-CM | POA: Diagnosis not present

## 2014-10-26 DIAGNOSIS — R32 Unspecified urinary incontinence: Secondary | ICD-10-CM | POA: Diagnosis not present

## 2014-10-26 DIAGNOSIS — N401 Enlarged prostate with lower urinary tract symptoms: Secondary | ICD-10-CM

## 2014-10-26 DIAGNOSIS — N5201 Erectile dysfunction due to arterial insufficiency: Secondary | ICD-10-CM | POA: Diagnosis not present

## 2014-11-25 NOTE — Assessment & Plan Note (Signed)
Presumptive diagnosis highly consistent with his body build and physical examination.

## 2014-11-25 NOTE — Assessment & Plan Note (Signed)
Good compliance and control at 16. We can continue as discussed. I again emphasized the importance of getting his weight under better control because of its impact on sleep apnea as well as his other problems.

## 2014-11-28 DIAGNOSIS — I10 Essential (primary) hypertension: Secondary | ICD-10-CM | POA: Diagnosis not present

## 2014-11-28 DIAGNOSIS — E119 Type 2 diabetes mellitus without complications: Secondary | ICD-10-CM | POA: Diagnosis not present

## 2014-11-28 DIAGNOSIS — Z6841 Body Mass Index (BMI) 40.0 and over, adult: Secondary | ICD-10-CM | POA: Diagnosis not present

## 2014-11-28 DIAGNOSIS — Z125 Encounter for screening for malignant neoplasm of prostate: Secondary | ICD-10-CM | POA: Diagnosis not present

## 2014-11-28 DIAGNOSIS — E782 Mixed hyperlipidemia: Secondary | ICD-10-CM | POA: Diagnosis not present

## 2014-12-04 DIAGNOSIS — E11349 Type 2 diabetes mellitus with severe nonproliferative diabetic retinopathy without macular edema: Secondary | ICD-10-CM | POA: Diagnosis not present

## 2014-12-04 DIAGNOSIS — H4011X3 Primary open-angle glaucoma, severe stage: Secondary | ICD-10-CM | POA: Diagnosis not present

## 2014-12-04 DIAGNOSIS — E1139 Type 2 diabetes mellitus with other diabetic ophthalmic complication: Secondary | ICD-10-CM | POA: Diagnosis not present

## 2014-12-04 DIAGNOSIS — E109 Type 1 diabetes mellitus without complications: Secondary | ICD-10-CM | POA: Diagnosis not present

## 2014-12-05 DIAGNOSIS — R339 Retention of urine, unspecified: Secondary | ICD-10-CM | POA: Diagnosis not present

## 2014-12-05 DIAGNOSIS — N3941 Urge incontinence: Secondary | ICD-10-CM | POA: Diagnosis not present

## 2014-12-14 ENCOUNTER — Ambulatory Visit (INDEPENDENT_AMBULATORY_CARE_PROVIDER_SITE_OTHER): Payer: Medicare Other | Admitting: Urology

## 2014-12-14 DIAGNOSIS — N5201 Erectile dysfunction due to arterial insufficiency: Secondary | ICD-10-CM | POA: Diagnosis not present

## 2014-12-14 DIAGNOSIS — N3941 Urge incontinence: Secondary | ICD-10-CM | POA: Diagnosis not present

## 2014-12-14 DIAGNOSIS — N401 Enlarged prostate with lower urinary tract symptoms: Secondary | ICD-10-CM | POA: Diagnosis not present

## 2015-02-01 ENCOUNTER — Ambulatory Visit (INDEPENDENT_AMBULATORY_CARE_PROVIDER_SITE_OTHER): Payer: Medicare Other | Admitting: Urology

## 2015-02-01 DIAGNOSIS — N3941 Urge incontinence: Secondary | ICD-10-CM | POA: Diagnosis not present

## 2015-02-01 DIAGNOSIS — N5201 Erectile dysfunction due to arterial insufficiency: Secondary | ICD-10-CM

## 2015-02-01 DIAGNOSIS — K5900 Constipation, unspecified: Secondary | ICD-10-CM

## 2015-02-01 DIAGNOSIS — N3281 Overactive bladder: Secondary | ICD-10-CM

## 2015-02-01 DIAGNOSIS — N401 Enlarged prostate with lower urinary tract symptoms: Secondary | ICD-10-CM | POA: Diagnosis not present

## 2015-03-13 DIAGNOSIS — G894 Chronic pain syndrome: Secondary | ICD-10-CM | POA: Diagnosis not present

## 2015-03-13 DIAGNOSIS — Z6841 Body Mass Index (BMI) 40.0 and over, adult: Secondary | ICD-10-CM | POA: Diagnosis not present

## 2015-04-01 DIAGNOSIS — Z6841 Body Mass Index (BMI) 40.0 and over, adult: Secondary | ICD-10-CM | POA: Diagnosis not present

## 2015-04-01 DIAGNOSIS — I10 Essential (primary) hypertension: Secondary | ICD-10-CM | POA: Diagnosis not present

## 2015-04-01 DIAGNOSIS — E119 Type 2 diabetes mellitus without complications: Secondary | ICD-10-CM | POA: Diagnosis not present

## 2015-04-01 DIAGNOSIS — E782 Mixed hyperlipidemia: Secondary | ICD-10-CM | POA: Diagnosis not present

## 2015-04-22 DIAGNOSIS — R312 Other microscopic hematuria: Secondary | ICD-10-CM | POA: Diagnosis not present

## 2015-04-22 DIAGNOSIS — N3941 Urge incontinence: Secondary | ICD-10-CM | POA: Diagnosis not present

## 2015-04-22 DIAGNOSIS — N3281 Overactive bladder: Secondary | ICD-10-CM | POA: Diagnosis not present

## 2015-04-22 DIAGNOSIS — N401 Enlarged prostate with lower urinary tract symptoms: Secondary | ICD-10-CM | POA: Diagnosis not present

## 2015-05-02 DIAGNOSIS — Z0001 Encounter for general adult medical examination with abnormal findings: Secondary | ICD-10-CM | POA: Diagnosis not present

## 2015-05-02 DIAGNOSIS — N200 Calculus of kidney: Secondary | ICD-10-CM | POA: Diagnosis not present

## 2015-05-02 DIAGNOSIS — R312 Other microscopic hematuria: Secondary | ICD-10-CM | POA: Diagnosis not present

## 2015-05-02 DIAGNOSIS — Z6841 Body Mass Index (BMI) 40.0 and over, adult: Secondary | ICD-10-CM | POA: Diagnosis not present

## 2015-05-09 DIAGNOSIS — N138 Other obstructive and reflux uropathy: Secondary | ICD-10-CM | POA: Diagnosis not present

## 2015-05-09 DIAGNOSIS — N3941 Urge incontinence: Secondary | ICD-10-CM | POA: Diagnosis not present

## 2015-05-09 DIAGNOSIS — N401 Enlarged prostate with lower urinary tract symptoms: Secondary | ICD-10-CM | POA: Diagnosis not present

## 2015-05-09 DIAGNOSIS — R312 Other microscopic hematuria: Secondary | ICD-10-CM | POA: Diagnosis not present

## 2015-05-09 DIAGNOSIS — N200 Calculus of kidney: Secondary | ICD-10-CM | POA: Diagnosis not present

## 2015-06-03 ENCOUNTER — Encounter: Payer: Self-pay | Admitting: Internal Medicine

## 2015-06-04 DIAGNOSIS — E11349 Type 2 diabetes mellitus with severe nonproliferative diabetic retinopathy without macular edema: Secondary | ICD-10-CM | POA: Diagnosis not present

## 2015-06-04 DIAGNOSIS — E119 Type 2 diabetes mellitus without complications: Secondary | ICD-10-CM | POA: Diagnosis not present

## 2015-06-04 DIAGNOSIS — E1039 Type 1 diabetes mellitus with other diabetic ophthalmic complication: Secondary | ICD-10-CM | POA: Diagnosis not present

## 2015-06-04 DIAGNOSIS — E11339 Type 2 diabetes mellitus with moderate nonproliferative diabetic retinopathy without macular edema: Secondary | ICD-10-CM | POA: Diagnosis not present

## 2015-06-11 DIAGNOSIS — N3941 Urge incontinence: Secondary | ICD-10-CM | POA: Diagnosis not present

## 2015-06-11 DIAGNOSIS — R3915 Urgency of urination: Secondary | ICD-10-CM | POA: Diagnosis not present

## 2015-06-11 DIAGNOSIS — R350 Frequency of micturition: Secondary | ICD-10-CM | POA: Diagnosis not present

## 2015-06-13 DIAGNOSIS — G894 Chronic pain syndrome: Secondary | ICD-10-CM | POA: Diagnosis not present

## 2015-06-13 DIAGNOSIS — Z6841 Body Mass Index (BMI) 40.0 and over, adult: Secondary | ICD-10-CM | POA: Diagnosis not present

## 2015-06-13 DIAGNOSIS — Z1389 Encounter for screening for other disorder: Secondary | ICD-10-CM | POA: Diagnosis not present

## 2015-06-18 DIAGNOSIS — R3915 Urgency of urination: Secondary | ICD-10-CM | POA: Diagnosis not present

## 2015-06-18 DIAGNOSIS — R350 Frequency of micturition: Secondary | ICD-10-CM | POA: Diagnosis not present

## 2015-06-18 DIAGNOSIS — N3941 Urge incontinence: Secondary | ICD-10-CM | POA: Diagnosis not present

## 2015-06-25 DIAGNOSIS — N3941 Urge incontinence: Secondary | ICD-10-CM | POA: Diagnosis not present

## 2015-06-27 ENCOUNTER — Ambulatory Visit (INDEPENDENT_AMBULATORY_CARE_PROVIDER_SITE_OTHER): Payer: Medicare Other | Admitting: Internal Medicine

## 2015-06-27 ENCOUNTER — Encounter: Payer: Self-pay | Admitting: Internal Medicine

## 2015-06-27 VITALS — BP 136/82 | HR 70 | Ht 68.0 in | Wt 317.4 lb

## 2015-06-27 DIAGNOSIS — G4733 Obstructive sleep apnea (adult) (pediatric): Secondary | ICD-10-CM

## 2015-06-27 NOTE — Progress Notes (Signed)
HPI- 08/17/11-64 year old never smoker followed for Allergic rhinitis, OSA, morbid obesity, obesity/ hypoventilation, complicated by back pain, DM, HBP. His wife died of lung disease(sounds like pulmonary fibrosis) after being on oxygen for 4 years. He felt threatened by my suggestion that we check home oximetry. He had cardiac workup it told him his heart was mildly enlarged. He has been able to be more active after surgery that has helped his back pain. Now he realizes how short of breath he is with exertion and is finding that knee pain is limiting activity. He asks about general anesthesia risk for knee surgery if he should have that. His back surgery and prior knee surgeries were done with local anesthetic blocks. Uses CPAP all night every night and with naps. Pressure is still at 16/Advanced. He gets Programmer, applications.  10/16/12- -64 year old never smoker followed for Allergic rhinitis, OSA, morbid obesity, obesity/ hypoventilation, complicated by back pain, DM, HBP. Had a flulike illness which has resolved. We have not heard any oximetry results and I suspect he didn't complete that. He continues fully compliant with CPAP. Activity is limited by obesity and chronic back pain. PFT- 12/18/11-moderate restriction total lung capacity 68% of predicted,  FEV1/FVC 0.80 with slight response to bronchodilator in small airway flows. Diffusion moderately reduced at 62% of predicted.  06/17/12- 64 year old never smoker followed for Allergic rhinitis, OSA, morbid obesity, obesity/ hypoventilation, complicated by back pain, DM, HBP. Wears CPAP 16/ Advanced every night for about 5-6 hours each night and during the day for naps about 2 hours. Pressure working well for patient as well. Gets supplies on line. Needs script for mask. Still limited by back pain and arthritis, but says doing some better. Never lost weight.  Reviewed home oximetry. 2/14/3- desat marginally with sleep on CPAP room air, and with  exertion on room air. He would rather work on weight / reluctant home O2. Little nasal congestion now.   06/19/13- 64 year old never smoker followed for Allergic rhinitis, OSA, morbid obesity, obesity/ hypoventilation, complicated by back pain, DM, HBP FOLLOWS FOR: wears CPAP 16/Advanced every night for about 8 hours; Going back to [facility] per patient. Not able to lose any weight. Activity remains limited by back pain. More daytime sleepiness and he asks about increasing the pressure. We discussed sleep hygiene. Putting his CPAP on sometimes in the daytime because of "no energy". He is reluctant to consider home oxygen because he thinks it helped his wife die of pulmonary fibrosis  06/25/14-64 year old never smoker followed for Allergic rhinitis, OSA, morbid obesity, obesity/ hypoventilation, complicated by back pain, DM, HBP FOLLOWS FOR: Wears cpap 16/ Advanced about 9-10 hours/day, broken up between day and night.  Pt denies pressure/mask supplies problems.   Pain meds for his back make him sleepy. Rhinitis controlled with interval use of Nasalcrom nasal spray  06/27/15-64 year old never smoker followed for Allergic rhinitis, OSA, morbid obesity, obesity/ hypoventilation, complicated by back pain, DM, HBP FOLLOWS FOR: Last seen 2015-had to get new sleep study in order to get new CPAP machine. DME: [facility] NPSG 09/22/14- AHI 72.7/ hr, CPAP to 19, weight 332 lbs  ROS-see HPI Constitutional:   No-   weight loss, night sweats, fevers, chills, +fatigue, lassitude. HEENT:   No-  headaches, difficulty swallowing, tooth/dental problems, sore throat,       No-  sneezing, itching, ear ache, nasal congestion, post nasal drip,  CV:  No-   chest pain, orthopnea, PND, swelling in lower extremities, anasarca, dizziness, palpitations Resp: + shortness of breath  with exertion or at rest.              No-   productive cough,  Occasional non-productive cough,  No-  coughing up of blood.              No-   change in color  of mucus.  Rare wheezing.   Skin: No-   rash or lesions. GI:  No-   heartburn, indigestion, abdominal pain, nausea, vomiting,  GU:  MS:  +   joint pain or swelling., + back pain Neuro-  Psych:  No- change in mood or affect.  +depression or anxiety.  No memory loss.  OBJ- Physical Exam General- Alert, Oriented, Affect-appropriate, Distress- none acute, +morbidly obese Skin- rash-none, lesions- none, excoriation- none Lymphadenopathy- none Head- atraumatic            Eyes- Gross vision intact, PERRLA, conjunctivae and secretions clear            Ears- Hearing, canals-normal            Nose- Clear, no-Septal dev, mucus, polyps, erosion, perforation             Throat- Mallampati IV , mucosa clear , drainage- none, tonsils- atrophic Neck- flexible , trachea midline, no stridor , thyroid nl, carotid no bruit Chest - symmetrical excursion , unlabored           Heart/CV- RRR , no murmur , no gallop  , no rub, nl s1 s2                           - JVD- none , edema- none, stasis changes- none, varices- none           Lung- clear to P&A, shallow consistent with body habitus, wheeze- none, cough- none , dullness-none, rub- none           Chest wall-  Abd- Br/ Gen/ Rectal- Not done, not indicated Extrem- cyanosis- none, clubbing, none, atrophy- none, strength- nl Neuro- grossly intact to observation

## 2015-06-27 NOTE — Patient Instructions (Signed)
Order- DME Advanced replacement for old CPAP machine, 19 cwp, mask of choice, humidifier, supplies, AirView                       Dx OSA   Updated NPSG was done 09/18/14  Please call as needed

## 2015-06-29 NOTE — Assessment & Plan Note (Signed)
He has not made an effective effort to lose weight despite years of counseling

## 2015-06-29 NOTE — Assessment & Plan Note (Signed)
He qualifies for replacement CPAP machine Plan- replacement CPAP machine, 19/ Advanced

## 2015-07-02 DIAGNOSIS — N3941 Urge incontinence: Secondary | ICD-10-CM | POA: Diagnosis not present

## 2015-07-02 DIAGNOSIS — R350 Frequency of micturition: Secondary | ICD-10-CM | POA: Diagnosis not present

## 2015-07-02 DIAGNOSIS — R3915 Urgency of urination: Secondary | ICD-10-CM | POA: Diagnosis not present

## 2015-07-09 DIAGNOSIS — R350 Frequency of micturition: Secondary | ICD-10-CM | POA: Diagnosis not present

## 2015-07-09 DIAGNOSIS — R3915 Urgency of urination: Secondary | ICD-10-CM | POA: Diagnosis not present

## 2015-07-09 DIAGNOSIS — N3941 Urge incontinence: Secondary | ICD-10-CM | POA: Diagnosis not present

## 2015-07-16 DIAGNOSIS — R350 Frequency of micturition: Secondary | ICD-10-CM | POA: Diagnosis not present

## 2015-07-16 DIAGNOSIS — N3941 Urge incontinence: Secondary | ICD-10-CM | POA: Diagnosis not present

## 2015-07-16 DIAGNOSIS — R3915 Urgency of urination: Secondary | ICD-10-CM | POA: Diagnosis not present

## 2015-07-23 DIAGNOSIS — N3941 Urge incontinence: Secondary | ICD-10-CM | POA: Diagnosis not present

## 2015-07-30 DIAGNOSIS — R350 Frequency of micturition: Secondary | ICD-10-CM | POA: Diagnosis not present

## 2015-07-30 DIAGNOSIS — R3915 Urgency of urination: Secondary | ICD-10-CM | POA: Diagnosis not present

## 2015-07-30 DIAGNOSIS — N3941 Urge incontinence: Secondary | ICD-10-CM | POA: Diagnosis not present

## 2015-08-06 DIAGNOSIS — N3941 Urge incontinence: Secondary | ICD-10-CM | POA: Diagnosis not present

## 2015-08-06 DIAGNOSIS — R3915 Urgency of urination: Secondary | ICD-10-CM | POA: Diagnosis not present

## 2015-08-06 DIAGNOSIS — R350 Frequency of micturition: Secondary | ICD-10-CM | POA: Diagnosis not present

## 2015-08-13 DIAGNOSIS — N3941 Urge incontinence: Secondary | ICD-10-CM | POA: Diagnosis not present

## 2015-08-20 DIAGNOSIS — R3915 Urgency of urination: Secondary | ICD-10-CM | POA: Diagnosis not present

## 2015-08-20 DIAGNOSIS — N3941 Urge incontinence: Secondary | ICD-10-CM | POA: Diagnosis not present

## 2015-08-20 DIAGNOSIS — R350 Frequency of micturition: Secondary | ICD-10-CM | POA: Diagnosis not present

## 2015-08-27 ENCOUNTER — Telehealth: Payer: Self-pay | Admitting: Internal Medicine

## 2015-08-27 NOTE — Telephone Encounter (Signed)
Left message for patient to call back.

## 2015-08-27 NOTE — Telephone Encounter (Signed)
Pt can be seen 09-10-15 at 11:15am slot. Thanks.

## 2015-08-27 NOTE — Telephone Encounter (Signed)
[phone] calling back

## 2015-08-27 NOTE — Telephone Encounter (Signed)
Pt scheduled for below date/time.  Nothing further needed.  Thanks!

## 2015-08-27 NOTE — Telephone Encounter (Signed)
Called and spoke to pt. Pt is requesting an appt with CY by 10/01/2015 due to insurance purposes. There are no current openings at this time on CY's schedule.   Katie please advise where pt can be worked in at as [place] is out for the week. Thanks.

## 2015-09-03 DIAGNOSIS — N3941 Urge incontinence: Secondary | ICD-10-CM | POA: Diagnosis not present

## 2015-09-03 DIAGNOSIS — R350 Frequency of micturition: Secondary | ICD-10-CM | POA: Diagnosis not present

## 2015-09-03 DIAGNOSIS — R3915 Urgency of urination: Secondary | ICD-10-CM | POA: Diagnosis not present

## 2015-09-10 ENCOUNTER — Encounter: Payer: Self-pay | Admitting: Internal Medicine

## 2015-09-10 ENCOUNTER — Ambulatory Visit (INDEPENDENT_AMBULATORY_CARE_PROVIDER_SITE_OTHER): Payer: Medicare Other | Admitting: Internal Medicine

## 2015-09-10 VITALS — BP 138/78 | HR 72 | Ht 68.0 in | Wt 318.8 lb

## 2015-09-10 DIAGNOSIS — G4733 Obstructive sleep apnea (adult) (pediatric): Secondary | ICD-10-CM | POA: Diagnosis not present

## 2015-09-10 DIAGNOSIS — Z23 Encounter for immunization: Secondary | ICD-10-CM

## 2015-09-10 DIAGNOSIS — J209 Acute bronchitis, unspecified: Secondary | ICD-10-CM

## 2015-09-10 NOTE — Progress Notes (Signed)
HPI- 08/17/11-64 year old never smoker followed for Allergic rhinitis, OSA, morbid obesity, obesity/ hypoventilation, complicated by back pain, DM, HBP. His wife died of lung disease(sounds like pulmonary fibrosis) after being on oxygen for 4 years. He felt threatened by my suggestion that we check home oximetry. He had cardiac workup it told him his heart was mildly enlarged. He has been able to be more active after surgery that has helped his back pain. Now he realizes how short of breath he is with exertion and is finding that knee pain is limiting activity. He asks about general anesthesia risk for knee surgery if he should have that. His back surgery and prior knee surgeries were done with local anesthetic blocks. Uses CPAP all night every night and with naps. Pressure is still at 16/Advanced. He gets Programmer, applications.  10/16/12- 64 year old never smoker followed for Allergic rhinitis, OSA, morbid obesity, obesity/ hypoventilation, complicated by back pain, DM, HBP. Had a flulike illness which has resolved. We have not heard any oximetry results and I suspect he didn't complete that. He continues fully compliant with CPAP. Activity is limited by obesity and chronic back pain. PFT- 12/18/11-moderate restriction total lung capacity 68% of predicted,  FEV1/FVC 0.80 with slight response to bronchodilator in small airway flows. Diffusion moderately reduced at 62% of predicted.  06/17/12- 64 year old never smoker followed for Allergic rhinitis, OSA, morbid obesity, obesity/ hypoventilation, complicated by back pain, DM, HBP. Wears CPAP 16/ Advanced every night for about 5-6 hours each night and during the day for naps about 2 hours. Pressure working well for patient as well. Gets supplies on line. Needs script for mask. Still limited by back pain and arthritis, but says doing some better. Never lost weight.  Reviewed home oximetry. 2/14/3- desat marginally with sleep on CPAP room air, and with  exertion on room air. He would rather work on weight / reluctant home O2. Little nasal congestion now.   06/19/13- 64 year old never smoker followed for Allergic rhinitis, OSA, morbid obesity, obesity/ hypoventilation, complicated by back pain, DM, HBP FOLLOWS FOR: wears CPAP 16/Advanced every night for about 8 hours; Going back to [facility] per patient. Not able to lose any weight. Activity remains limited by back pain. More daytime sleepiness and he asks about increasing the pressure. We discussed sleep hygiene. Putting his CPAP on sometimes in the daytime because of "no energy". He is reluctant to consider home oxygen because he thinks it helped his wife die of pulmonary fibrosis  06/25/14-64 year old never smoker followed for Allergic rhinitis, OSA, morbid obesity, obesity/ hypoventilation, complicated by back pain, DM, HBP FOLLOWS FOR: Wears cpap 16/ Advanced about 9-10 hours/day, broken up between day and night.  Pt denies pressure/mask supplies problems.   Pain meds for his back make him sleepy. Rhinitis controlled with interval use of Nasalcrom nasal spray  06/27/15-64 year old never smoker followed for Allergic rhinitis, OSA, morbid obesity, obesity/ hypoventilation, complicated by back pain, DM, HBP FOLLOWS FOR: Last seen 2015-had to get new sleep study in order to get new CPAP machine. DME: [facility] NPSG 09/22/14- AHI 72.7/ hr, CPAP to 19, weight 332 lbs  09/10/15- 64 year old never smoker followed for Allergic rhinitis, OSA, morbid obesity, obesity/ hypoventilation, complicated by back pain, DM, HBP CPAP 19/ Advanced Follows for: Pt states that he is using his CPAP nightly. Pt states that he feels better during the day. Pt c/o of congestion, cough with yellow mucus and hoarseness for several weeks. .  Had a recent cold with cough, throat discomfort, sinus drainage.  He had trouble wearing CPAP until this began to improve. He has been comfortable since we increased pressure to 19.  ROS-see  HPI Constitutional:   No-   weight loss, night sweats, fevers, chills, +fatigue, lassitude. HEENT:   No-  headaches, difficulty swallowing, tooth/dental problems, sore throat,       No-  sneezing, itching, ear ache, nasal congestion, post nasal drip,  CV:  No-   chest pain, orthopnea, PND, swelling in lower extremities, anasarca, dizziness, palpitations Resp: + shortness of breath with exertion or at rest.              No-   productive cough,  Occasional non-productive cough,  No-  coughing up of blood.              No-   change in color of mucus.  Rare wheezing.   Skin: No-   rash or lesions. GI:  No-   heartburn, indigestion, abdominal pain, nausea, vomiting,  GU:  MS:  +   joint pain or swelling., + back pain Neuro-  Psych:  No- change in mood or affect.  +depression or anxiety.  No memory loss.  OBJ- Physical Exam General- Alert, Oriented, Affect-appropriate, Distress- none acute, +morbidly obese Skin- rash-none, lesions- none, excoriation- none Lymphadenopathy- none Head- atraumatic            Eyes- Gross vision intact, PERRLA, conjunctivae and secretions clear            Ears- Hearing, canals-normal            Nose- Clear, no-Septal dev, mucus, polyps, erosion, perforation             Throat- Mallampati IV , mucosa clear , drainage- none, tonsils- atrophic, + mild hoarseness Neck- flexible , trachea midline, no stridor , thyroid nl, carotid no bruit Chest - symmetrical excursion , unlabored           Heart/CV- RRR , no murmur , no gallop  , no rub, nl s1 s2                           - JVD- none , edema- none, stasis changes- none, varices- none           Lung- clear to P&A, shallow consistent with body habitus, wheeze- none, cough- none , dullness-none, rub- none           Chest wall-  Abd- Br/ Gen/ Rectal- Not done, not indicated Extrem- cyanosis- none, clubbing, none, atrophy- none, strength- nl Neuro- grossly intact to observation

## 2015-09-10 NOTE — Patient Instructions (Signed)
Flu vax  Ok to continue CPAP 19/ Advanced   Ok to continue comfort measures with fluids and rest to finish getting over your cold

## 2015-09-16 ENCOUNTER — Encounter: Payer: Self-pay | Admitting: Internal Medicine

## 2015-09-16 DIAGNOSIS — E782 Mixed hyperlipidemia: Secondary | ICD-10-CM | POA: Diagnosis not present

## 2015-09-16 DIAGNOSIS — Z6841 Body Mass Index (BMI) 40.0 and over, adult: Secondary | ICD-10-CM | POA: Diagnosis not present

## 2015-09-16 DIAGNOSIS — Z1389 Encounter for screening for other disorder: Secondary | ICD-10-CM | POA: Diagnosis not present

## 2015-09-16 DIAGNOSIS — E119 Type 2 diabetes mellitus without complications: Secondary | ICD-10-CM | POA: Diagnosis not present

## 2015-09-16 DIAGNOSIS — I10 Essential (primary) hypertension: Secondary | ICD-10-CM | POA: Diagnosis not present

## 2015-10-01 DIAGNOSIS — N3941 Urge incontinence: Secondary | ICD-10-CM | POA: Diagnosis not present

## 2015-10-01 DIAGNOSIS — J209 Acute bronchitis, unspecified: Secondary | ICD-10-CM | POA: Insufficient documentation

## 2015-10-01 NOTE — Assessment & Plan Note (Signed)
Generally better control, confirmed by download, since we increased pressure to 19. Emphasized importance of weight loss.

## 2015-10-01 NOTE — Assessment & Plan Note (Signed)
Resolving acute bronchitis, probably viral. He is past the worst of it and I think while he is here it is appropriate to let him have flu vaccine and he requests.

## 2015-10-01 NOTE — Assessment & Plan Note (Signed)
Long-standing severe obesity complicated by chronic back pain Plan-offered bariatric referral

## 2015-10-16 ENCOUNTER — Encounter: Payer: Self-pay | Admitting: Internal Medicine

## 2015-10-22 DIAGNOSIS — N3941 Urge incontinence: Secondary | ICD-10-CM | POA: Diagnosis not present

## 2015-11-12 DIAGNOSIS — N3941 Urge incontinence: Secondary | ICD-10-CM | POA: Diagnosis not present

## 2015-12-17 DIAGNOSIS — E782 Mixed hyperlipidemia: Secondary | ICD-10-CM | POA: Diagnosis not present

## 2015-12-17 DIAGNOSIS — Z125 Encounter for screening for malignant neoplasm of prostate: Secondary | ICD-10-CM | POA: Diagnosis not present

## 2015-12-17 DIAGNOSIS — E109 Type 1 diabetes mellitus without complications: Secondary | ICD-10-CM | POA: Diagnosis not present

## 2015-12-17 DIAGNOSIS — I10 Essential (primary) hypertension: Secondary | ICD-10-CM | POA: Diagnosis not present

## 2015-12-17 DIAGNOSIS — R350 Frequency of micturition: Secondary | ICD-10-CM | POA: Diagnosis not present

## 2015-12-17 DIAGNOSIS — R3915 Urgency of urination: Secondary | ICD-10-CM | POA: Diagnosis not present

## 2015-12-17 DIAGNOSIS — Z1389 Encounter for screening for other disorder: Secondary | ICD-10-CM | POA: Diagnosis not present

## 2015-12-17 DIAGNOSIS — E118 Type 2 diabetes mellitus with unspecified complications: Secondary | ICD-10-CM | POA: Diagnosis not present

## 2015-12-17 DIAGNOSIS — Z6841 Body Mass Index (BMI) 40.0 and over, adult: Secondary | ICD-10-CM | POA: Diagnosis not present

## 2015-12-17 DIAGNOSIS — N3941 Urge incontinence: Secondary | ICD-10-CM | POA: Diagnosis not present

## 2015-12-18 DIAGNOSIS — H401130 Primary open-angle glaucoma, bilateral, stage unspecified: Secondary | ICD-10-CM | POA: Diagnosis not present

## 2015-12-18 DIAGNOSIS — E113391 Type 2 diabetes mellitus with moderate nonproliferative diabetic retinopathy without macular edema, right eye: Secondary | ICD-10-CM | POA: Diagnosis not present

## 2015-12-18 DIAGNOSIS — E113492 Type 2 diabetes mellitus with severe nonproliferative diabetic retinopathy without macular edema, left eye: Secondary | ICD-10-CM | POA: Diagnosis not present

## 2015-12-18 DIAGNOSIS — H31009 Unspecified chorioretinal scars, unspecified eye: Secondary | ICD-10-CM | POA: Diagnosis not present

## 2016-01-14 DIAGNOSIS — N3941 Urge incontinence: Secondary | ICD-10-CM | POA: Diagnosis not present

## 2016-01-14 DIAGNOSIS — R3915 Urgency of urination: Secondary | ICD-10-CM | POA: Diagnosis not present

## 2016-01-14 DIAGNOSIS — R350 Frequency of micturition: Secondary | ICD-10-CM | POA: Diagnosis not present

## 2016-02-05 ENCOUNTER — Telehealth: Payer: Self-pay | Admitting: Internal Medicine

## 2016-02-05 NOTE — Telephone Encounter (Signed)
Called and spoke with pt. Pt states he received a call from [facility] stating that our office needs to send information to them stating that he has been seen since starting his new CPAP. I explained to him that I would call [facility] and discuss this situation. He voiced understanding and had no further questions.    Attempted to call [facility] but was placed on hold for 6 mins. WCB

## 2016-02-06 NOTE — Telephone Encounter (Signed)
Spoke with Colton Jackson, states that their system is up to date and they do not need anything further from our office. Colton Jackson states that she is not sure why Colton Jackson got this call bc they have all notes needed. [facility] will follow up with this patient today for supplies request.   Spoke with patient, aware that [facility] will be contacting him today. Will call back if anything further needed.  Nothing further needed at this time.

## 2016-02-11 DIAGNOSIS — R3915 Urgency of urination: Secondary | ICD-10-CM | POA: Diagnosis not present

## 2016-02-11 DIAGNOSIS — N3941 Urge incontinence: Secondary | ICD-10-CM | POA: Diagnosis not present

## 2016-02-11 DIAGNOSIS — R350 Frequency of micturition: Secondary | ICD-10-CM | POA: Diagnosis not present

## 2016-03-10 DIAGNOSIS — N3941 Urge incontinence: Secondary | ICD-10-CM | POA: Diagnosis not present

## 2016-03-20 ENCOUNTER — Ambulatory Visit (INDEPENDENT_AMBULATORY_CARE_PROVIDER_SITE_OTHER): Payer: Medicare Other | Admitting: Urology

## 2016-03-20 DIAGNOSIS — N3941 Urge incontinence: Secondary | ICD-10-CM | POA: Diagnosis not present

## 2016-03-20 DIAGNOSIS — N3281 Overactive bladder: Secondary | ICD-10-CM | POA: Diagnosis not present

## 2016-03-23 DIAGNOSIS — Z6841 Body Mass Index (BMI) 40.0 and over, adult: Secondary | ICD-10-CM | POA: Diagnosis not present

## 2016-03-23 DIAGNOSIS — E109 Type 1 diabetes mellitus without complications: Secondary | ICD-10-CM | POA: Diagnosis not present

## 2016-03-23 DIAGNOSIS — Z1389 Encounter for screening for other disorder: Secondary | ICD-10-CM | POA: Diagnosis not present

## 2016-03-23 DIAGNOSIS — I10 Essential (primary) hypertension: Secondary | ICD-10-CM | POA: Diagnosis not present

## 2016-03-23 DIAGNOSIS — G894 Chronic pain syndrome: Secondary | ICD-10-CM | POA: Diagnosis not present

## 2016-04-07 DIAGNOSIS — R350 Frequency of micturition: Secondary | ICD-10-CM | POA: Diagnosis not present

## 2016-04-07 DIAGNOSIS — R3915 Urgency of urination: Secondary | ICD-10-CM | POA: Diagnosis not present

## 2016-04-07 DIAGNOSIS — N3941 Urge incontinence: Secondary | ICD-10-CM | POA: Diagnosis not present

## 2016-05-05 DIAGNOSIS — N3941 Urge incontinence: Secondary | ICD-10-CM | POA: Diagnosis not present

## 2016-05-26 DIAGNOSIS — N3941 Urge incontinence: Secondary | ICD-10-CM | POA: Diagnosis not present

## 2016-06-02 DIAGNOSIS — M545 Low back pain: Secondary | ICD-10-CM | POA: Diagnosis not present

## 2016-06-02 DIAGNOSIS — G894 Chronic pain syndrome: Secondary | ICD-10-CM | POA: Diagnosis not present

## 2016-06-02 DIAGNOSIS — E782 Mixed hyperlipidemia: Secondary | ICD-10-CM | POA: Diagnosis not present

## 2016-06-02 DIAGNOSIS — Z1389 Encounter for screening for other disorder: Secondary | ICD-10-CM | POA: Diagnosis not present

## 2016-06-02 DIAGNOSIS — E1165 Type 2 diabetes mellitus with hyperglycemia: Secondary | ICD-10-CM | POA: Diagnosis not present

## 2016-06-02 DIAGNOSIS — Z6841 Body Mass Index (BMI) 40.0 and over, adult: Secondary | ICD-10-CM | POA: Diagnosis not present

## 2016-06-02 DIAGNOSIS — Z0001 Encounter for general adult medical examination with abnormal findings: Secondary | ICD-10-CM | POA: Diagnosis not present

## 2016-06-02 DIAGNOSIS — I10 Essential (primary) hypertension: Secondary | ICD-10-CM | POA: Diagnosis not present

## 2016-06-16 DIAGNOSIS — N3941 Urge incontinence: Secondary | ICD-10-CM | POA: Diagnosis not present

## 2016-06-17 DIAGNOSIS — H401131 Primary open-angle glaucoma, bilateral, mild stage: Secondary | ICD-10-CM | POA: Diagnosis not present

## 2016-06-17 DIAGNOSIS — E113391 Type 2 diabetes mellitus with moderate nonproliferative diabetic retinopathy without macular edema, right eye: Secondary | ICD-10-CM | POA: Diagnosis not present

## 2016-06-17 DIAGNOSIS — E113392 Type 2 diabetes mellitus with moderate nonproliferative diabetic retinopathy without macular edema, left eye: Secondary | ICD-10-CM | POA: Diagnosis not present

## 2016-06-17 DIAGNOSIS — E119 Type 2 diabetes mellitus without complications: Secondary | ICD-10-CM | POA: Diagnosis not present

## 2016-06-17 DIAGNOSIS — H43811 Vitreous degeneration, right eye: Secondary | ICD-10-CM | POA: Diagnosis not present

## 2016-06-22 DIAGNOSIS — E1165 Type 2 diabetes mellitus with hyperglycemia: Secondary | ICD-10-CM | POA: Diagnosis not present

## 2016-06-22 DIAGNOSIS — Z0001 Encounter for general adult medical examination with abnormal findings: Secondary | ICD-10-CM | POA: Diagnosis not present

## 2016-06-22 DIAGNOSIS — Z1389 Encounter for screening for other disorder: Secondary | ICD-10-CM | POA: Diagnosis not present

## 2016-06-22 DIAGNOSIS — E782 Mixed hyperlipidemia: Secondary | ICD-10-CM | POA: Diagnosis not present

## 2016-06-23 DIAGNOSIS — Z1389 Encounter for screening for other disorder: Secondary | ICD-10-CM | POA: Diagnosis not present

## 2016-06-23 DIAGNOSIS — E109 Type 1 diabetes mellitus without complications: Secondary | ICD-10-CM | POA: Diagnosis not present

## 2016-06-23 DIAGNOSIS — I10 Essential (primary) hypertension: Secondary | ICD-10-CM | POA: Diagnosis not present

## 2016-06-23 DIAGNOSIS — Z6841 Body Mass Index (BMI) 40.0 and over, adult: Secondary | ICD-10-CM | POA: Diagnosis not present

## 2016-06-23 DIAGNOSIS — E119 Type 2 diabetes mellitus without complications: Secondary | ICD-10-CM | POA: Diagnosis not present

## 2016-06-23 DIAGNOSIS — E782 Mixed hyperlipidemia: Secondary | ICD-10-CM | POA: Diagnosis not present

## 2016-06-23 DIAGNOSIS — G894 Chronic pain syndrome: Secondary | ICD-10-CM | POA: Diagnosis not present

## 2016-06-23 DIAGNOSIS — E538 Deficiency of other specified B group vitamins: Secondary | ICD-10-CM | POA: Diagnosis not present

## 2016-06-26 ENCOUNTER — Ambulatory Visit: Payer: Medicare Other | Admitting: Internal Medicine

## 2016-07-07 DIAGNOSIS — N3941 Urge incontinence: Secondary | ICD-10-CM | POA: Diagnosis not present

## 2016-08-04 DIAGNOSIS — N3941 Urge incontinence: Secondary | ICD-10-CM | POA: Diagnosis not present

## 2016-09-01 DIAGNOSIS — N3941 Urge incontinence: Secondary | ICD-10-CM | POA: Diagnosis not present

## 2016-09-08 ENCOUNTER — Encounter: Payer: Self-pay | Admitting: Internal Medicine

## 2016-09-09 ENCOUNTER — Ambulatory Visit (INDEPENDENT_AMBULATORY_CARE_PROVIDER_SITE_OTHER): Payer: Medicare Other | Admitting: Internal Medicine

## 2016-09-09 ENCOUNTER — Encounter: Payer: Self-pay | Admitting: Internal Medicine

## 2016-09-09 ENCOUNTER — Ambulatory Visit: Payer: Medicare Other | Admitting: Internal Medicine

## 2016-09-09 ENCOUNTER — Ambulatory Visit (INDEPENDENT_AMBULATORY_CARE_PROVIDER_SITE_OTHER)
Admission: RE | Admit: 2016-09-09 | Discharge: 2016-09-09 | Disposition: A | Discharge status: Home / Self Care | Payer: Medicare Other | Source: Ambulatory Visit | Attending: Internal Medicine | Admitting: Internal Medicine

## 2016-09-09 VITALS — BP 140/72 | HR 94 | Ht 68.0 in | Wt 318.4 lb

## 2016-09-09 DIAGNOSIS — E662 Morbid (severe) obesity with alveolar hypoventilation: Secondary | ICD-10-CM

## 2016-09-09 DIAGNOSIS — G4733 Obstructive sleep apnea (adult) (pediatric): Secondary | ICD-10-CM | POA: Diagnosis not present

## 2016-09-09 DIAGNOSIS — R0609 Other forms of dyspnea: Secondary | ICD-10-CM

## 2016-09-09 DIAGNOSIS — R0602 Shortness of breath: Secondary | ICD-10-CM | POA: Diagnosis not present

## 2016-09-09 DIAGNOSIS — Z23 Encounter for immunization: Secondary | ICD-10-CM

## 2016-09-09 NOTE — Assessment & Plan Note (Signed)
Known severe obstructive sleep apnea aggravated by body habitus. He is doing well with CPAP at current setting. Plan-continue CPAP 19. Encourage weight loss.

## 2016-09-09 NOTE — Assessment & Plan Note (Signed)
Recommend bariatric  consultation

## 2016-09-09 NOTE — Patient Instructions (Addendum)
Order DME Advanced- needs replacement CPAP heated hose. Continue CPAP 19, mask of choice, humidifier, supplies, AirView   Dx OSA  Flu vax  Order- schedule PFT     Dx dyspnea on exertion  Order CXR- former welder, dyspnea on exertion

## 2016-09-09 NOTE — Assessment & Plan Note (Signed)
Body habitus remains very suggestive of OHS. No successful weight loss and all the years I have known him.

## 2016-09-09 NOTE — Assessment & Plan Note (Signed)
Obesity hypoventilation and deconditioning remain significant. I do not hear wheezing or rales and he does not have neck vein distention. Plan-schedule PFT, CXR

## 2016-09-09 NOTE — Progress Notes (Signed)
HPI- M never smoker followed for Allergic rhinitis, OSA, morbid obesity, obesity/ hypoventilation, complicated by back pain, DM, HBP.   06/27/15-65 year old never smoker followed for Allergic rhinitis, OSA, morbid obesity, obesity/ hypoventilation, complicated by back pain, DM, HBP FOLLOWS FOR: Last seen 2015-had to get new sleep study in order to get new CPAP machine. DME: [facility] NPSG 09/22/14- AHI 72.7/ hr, CPAP to 19, weight 332 lbs  09/10/15- 89 year old never smoker followed for Allergic rhinitis, OSA, morbid obesity, obesity/ hypoventilation, complicated by back pain, DM, HBP CPAP 19/ Advanced Follows for: Pt states that he is using his CPAP nightly. Pt states that he feels better during the day. Pt c/o of congestion, cough with yellow mucus and hoarseness for several weeks. .  Had a recent cold with cough, throat discomfort, sinus drainage. He had trouble wearing CPAP until this began to improve. He has been comfortable since we increased pressure to 19.  09/09/2016-65 year old male never smoker followed for Allergic rhinitis, OSA, obesity/hypoventilation, complicated by morbid obesity, back pain, DM, HBP CPAP 19/Advanced FOLLOWS FOR: DME: [facility]. Pt wears CPAP nightly for at least 8-10 hours; pressure working well for patient. Pt will need order for supplies today. DL attached. Notices gradually increasing dyspnea with exertion over the summer. Worked as a [name] for 20 years. Denies cough or wheeze. Had CT chest by urology one year ago. Report is not in our system. CPAP download shows excellent compliance and good control. He sleeps better with CPAP but is been having some moisture problems and his mask which we discussed.  ROS-see HPI Constitutional:   No-   weight loss, night sweats, fevers, chills, +fatigue, lassitude. HEENT:   No-  headaches, difficulty swallowing, tooth/dental problems, sore throat,       No-  sneezing, itching, ear ache, nasal congestion, post nasal drip,  CV:  No-    chest pain, orthopnea, PND, swelling in lower extremities, anasarca, dizziness, palpitations Resp: + shortness of breath with exertion or at rest.              No-   productive cough,  Occasional non-productive cough,  No-  coughing up of blood.              No-   change in color of mucus.  Rare wheezing.   Skin: No-   rash or lesions. GI:  No-   heartburn, indigestion, abdominal pain, nausea, vomiting,  GU:  MS:  +   joint pain or swelling., + back pain Neuro-  Psych:  No- change in mood or affect.  +depression or anxiety.  No memory loss.  OBJ- Physical Exam General- Alert, Oriented, Affect-appropriate, Distress- none acute, +morbidly obese Skin- rash-none, lesions- none, excoriation- none Lymphadenopathy- none Head- atraumatic            Eyes- Gross vision intact, PERRLA, conjunctivae and secretions clear            Ears- Hearing, canals-normal            Nose- Clear, no-Septal dev, mucus, polyps, erosion, perforation             Throat- Mallampati IV , mucosa clear , drainage- none, tonsils- atrophic, + mild hoarseness Neck- flexible , trachea midline, no stridor , thyroid nl, carotid no bruit Chest - symmetrical excursion , unlabored           Heart/CV- RRR , no murmur , no gallop  , no rub, nl s1 s2                           -  JVD- none , edema- none, stasis changes- none, varices- none           Lung- clear to P&A, + shallow consistent with body habitus, wheeze- none, cough- none , dullness-none, rub- none           Chest wall-  Abd- Br/ Gen/ Rectal- Not done, not indicated Extrem- cyanosis- none, clubbing, none, atrophy- none, strength- nl Neuro- grossly intact to observation

## 2016-09-11 ENCOUNTER — Ambulatory Visit (INDEPENDENT_AMBULATORY_CARE_PROVIDER_SITE_OTHER): Payer: Medicare Other | Admitting: Urology

## 2016-09-11 DIAGNOSIS — N3281 Overactive bladder: Secondary | ICD-10-CM

## 2016-09-11 DIAGNOSIS — N5201 Erectile dysfunction due to arterial insufficiency: Secondary | ICD-10-CM

## 2016-09-11 DIAGNOSIS — Z87442 Personal history of urinary calculi: Secondary | ICD-10-CM

## 2016-09-11 DIAGNOSIS — N3941 Urge incontinence: Secondary | ICD-10-CM

## 2016-09-28 DIAGNOSIS — Z6841 Body Mass Index (BMI) 40.0 and over, adult: Secondary | ICD-10-CM | POA: Diagnosis not present

## 2016-09-28 DIAGNOSIS — E782 Mixed hyperlipidemia: Secondary | ICD-10-CM | POA: Diagnosis not present

## 2016-09-28 DIAGNOSIS — G4733 Obstructive sleep apnea (adult) (pediatric): Secondary | ICD-10-CM | POA: Diagnosis not present

## 2016-09-28 DIAGNOSIS — Z1389 Encounter for screening for other disorder: Secondary | ICD-10-CM | POA: Diagnosis not present

## 2016-09-28 DIAGNOSIS — G894 Chronic pain syndrome: Secondary | ICD-10-CM | POA: Diagnosis not present

## 2016-09-28 DIAGNOSIS — E109 Type 1 diabetes mellitus without complications: Secondary | ICD-10-CM | POA: Diagnosis not present

## 2016-09-29 DIAGNOSIS — N3941 Urge incontinence: Secondary | ICD-10-CM | POA: Diagnosis not present

## 2016-10-02 ENCOUNTER — Ambulatory Visit (INDEPENDENT_AMBULATORY_CARE_PROVIDER_SITE_OTHER): Payer: Medicare Other | Admitting: Cardiology

## 2016-10-02 ENCOUNTER — Encounter: Payer: Self-pay | Admitting: Cardiology

## 2016-10-02 VITALS — BP 150/82 | HR 83 | Ht 68.0 in | Wt 314.0 lb

## 2016-10-02 DIAGNOSIS — I10 Essential (primary) hypertension: Secondary | ICD-10-CM | POA: Diagnosis not present

## 2016-10-02 DIAGNOSIS — R0609 Other forms of dyspnea: Secondary | ICD-10-CM

## 2016-10-02 DIAGNOSIS — R0602 Shortness of breath: Secondary | ICD-10-CM | POA: Diagnosis not present

## 2016-10-02 DIAGNOSIS — G4733 Obstructive sleep apnea (adult) (pediatric): Secondary | ICD-10-CM

## 2016-10-02 NOTE — Patient Instructions (Signed)
Your physician recommends that you schedule a follow-up appointment in:  To be determined    Your physician recommends that you continue on your current medications as directed. Please refer to the Current Medication list given to you today.     Your physician has requested that you have an echocardiogram. Echocardiography is a painless test that uses sound waves to create images of your heart. It provides your doctor with information about the size and shape of your heart and how well your heart's chambers and valves are working. This procedure takes approximately one hour. There are no restrictions for this procedure.      Thank you for choosing [facility] !

## 2016-10-02 NOTE — Progress Notes (Signed)
Cardiology Office Note  Date: 10/02/2016   ID: Victorino Dike, DOB 1951-11-15, MRN [id number]  PCP: Purvis Kilts, MD  Consulting Cardiologist: Rozann Lesches, MD   Chief Complaint  Patient presents with  . Shortness of Breath    History of Present Illness: Colton Jackson is a 65 y.o. male referred for cardiology consultation by Dr. Hilma Favors. He presents with a chronic history of shortness of breath. Does not report any decline recently but did have a chest x-ray done indicating interstitial edema, read as possible congestive heart failure. He does not have any history of cardiomyopathy. Does have sleep apnea and uses CPAP, follows with [name]. He reports compliance with treatments. He gets fatigued later in the afternoon, sometimes takes a nap around 3 or 4 clock. He uses compression stockings and states that this controls his leg edema. Other than hydrochlorothiazide, he is not on any diuretics.  He has been evaluated in the past by [facility], saw Dr. Melvern Banker greater than 10 years ago reportedly with a normal cardiac catheterization and also Dr. Debara Pickett back in 2014. I do not have complete records. Previous cardiac testing including Myoview and echocardiogram from 2012 are reviewed below.  I reviewed his ECG today which shows normal sinus rhythm.  We went over his medications. Recent lipids actually look fairly good as outlined below.   Past Medical History:  Diagnosis Date  . Allergic rhinitis   . Degenerative disk disease   . Essential hypertension   . H/O cardiovascular stress test 02/02/11   EF 59% - Normal stress test  . Knee pain, bilateral   . Lower back pain   . Morbid obesity ([place])   . OSA (obstructive sleep apnea)    On CPAP  . Type 2 diabetes mellitus ([place])     Past Surgical History:  Procedure Laterality Date  . APPENDECTOMY    . Cataract surgery Bilateral   . KNEE ARTHROSCOPY Bilateral   . NASAL SEPTOPLASTY W/ TURBINOPLASTY      Current Outpatient  Prescriptions  Medication Sig Dispense Refill  . amLODipine (NORVASC) 5 MG tablet Take 5 mg by mouth daily.    Marland Kitchen aspirin 325 MG tablet Take 325 mg by mouth daily.      . cloNIDine (CATAPRES) 0.2 MG tablet Take 0.2 mg by mouth 3 (three) times daily.      . cromolyn (NASALCROM) 5.2 MG/ACT nasal spray Place 1 spray into the nose 4 (four) times daily as needed.     Marland Kitchen glipiZIDE (GLUCOTROL) 10 MG tablet Take 10 mg by mouth 2 (two) times daily before a meal.      . hydrochlorothiazide (HYDRODIURIL) 25 MG tablet Take 25 mg by mouth daily.      Marland Kitchen HYDROcodone-acetaminophen (LORCET) 10-650 MG per tablet Take 1 tablet by mouth 2 (two) times daily as needed.    . insulin detemir (LEVEMIR) 100 UNIT/ML injection Inject 58 Units into the skin at bedtime.    Marland Kitchen KRILL OIL PO Take 1 tablet by mouth 2 (two) times daily.    Marland Kitchen latanoprost (XALATAN) 0.005 % ophthalmic solution Place 1 drop into both eyes at bedtime.      Marland Kitchen losartan (COZAAR) 100 MG tablet Take 100 mg by mouth daily.    . meclizine (ANTIVERT) 25 MG tablet Take 25 mg by mouth 3 (three) times daily as needed.    . meloxicam (MOBIC) 7.5 MG tablet Take 7.5 mg by mouth 2 (two) times daily as needed for pain.    Marland Kitchen  metFORMIN (GLUMETZA) 500 MG (MOD) 24 hr tablet Take 1,000 mg by mouth 2 (two) times daily.     . metoprolol (TOPROL-XL) 50 MG 24 hr tablet Take 50 mg by mouth daily.      No current facility-administered medications for this visit.    Allergies:  Sulfa antibiotics and Sulfonamide derivatives   Social History: The patient  reports that he has never smoked. He has never used smokeless tobacco. He reports that he does not drink alcohol or use drugs.   Family History: The patient's family history includes Cancer in his mother; Diabetes in his father, maternal grandfather, mother, paternal grandfather, and sister; Diabetes (age of onset: 87) in his brother; Heart disease in his paternal grandmother; Lung disease in his father; Stroke (age of onset: 71)  in his father; Stroke (age of onset: 72) in his paternal grandfather; Stroke (age of onset: 81) in his mother.   ROS:  Please see the history of present illness. Otherwise, complete review of systems is positive for chronic back pain.  All other systems are reviewed and negative.   Physical Exam: VS:  BP (!) 150/82 (BP Location: Right Arm)   Pulse 83   Ht _0  (1.727 m)   Wt (!) 314 lb (142.4 kg)   SpO2 97%   BMI 47.74 kg/m , BMI Body mass index is 47.74 kg/m.  Wt Readings from Last 3 Encounters:  10/02/16 (!) 314 lb (142.4 kg)  09/09/16 (!) 318 lb 6.4 oz (144.4 kg)  09/10/15 (!) 318 lb 12.8 oz (144.6 kg)    General: Morbidly obese male, appears comfortable at rest. HEENT: Conjunctiva and lids normal, oropharynx clear. Neck: Supple, increased girth without obvious elevated JVP, no thyromegaly. Lungs: Clear to auscultation, nonlabored breathing at rest. Cardiac: Regular rate and rhythm, no S3 or significant systolic murmur, no pericardial rub. Abdomen: Obese, nontender, bowel sounds present, no guarding or rebound. Extremities: Mild leg edema with compression stockings in place, distal pulses 2+. Skin: Warm and dry. Musculoskeletal: No kyphosis. Neuropsychiatric: Alert and oriented x3, affect grossly appropriate.  ECG: I personally reviewed the tracing from 02/21/2013 which showed normal sinus rhythm.  Recent Labwork:  August 2017: Cholesterol 134, HDL 40, LDL 70, triglycerides 119, TSH 2.4, creatinine 0.8, BUN 27  Other Studies Reviewed Today:  Chest x-ray 09/09/2016: FINDINGS: Cardiac silhouette is top-normal in size. No mediastinal or hilar masses. No convincing adenopathy.  There is vascular congestion with mild lower lung zone interstitial prominence. No lung consolidation. No pleural effusion or pneumothorax.  Skeletal structures are intact.  IMPRESSION: 1. Vascular congestion with mild interstitial prominence. Consider a mild degree of congestive heart  failure. 2. No evidence of pneumonia.  Persantine Myoview 02/02/2011 [facility]): Normal perfusion with LVEF 59%.  Echocardiogram 02/02/2011 [facility]): Normal wall thickness with LVEF greater than 55%, mildly dilated right ventricle with normal contraction, mildly dilated right atrium, trace mitral regurgitation, trace tricuspid regurgitation, RVSP 30 mmHg.  Assessment and Plan:  1. Chronic shortness of breath and recent chest x-ray indicating mild interstitial edema. Patient has no history of cardiomyopathy but does likely have some degree of diastolic dysfunction. He has morbid obesity with associated obstructive sleep apnea as well as hypertension and type 2 diabetes mellitus. We will obtain an echocardiogram for further assessment. May need a somewhat more potent diuretic than HCTZ.  2. Morbid obesity. Patient has not been able to lose any significant amount of weight over the years based on chart review.  3. Obstructive sleep apnea, reportedly compliant  with CPAP and follows with [name].  4. Essential hypertension. Current regimen includes Norvasc, clonidine, Toprol-XL, HCTZ, and Cozaar.  Current medicines were reviewed with the patient today.   Orders Placed This Encounter  Procedures  . EKG 12-Lead  . ECHOCARDIOGRAM COMPLETE    Disposition: Call with results.  Signed, Satira Sark, MD, [facility] 10/02/2016 1:19 PM    [place] [facility] [address] [place], [place] [address] Phone: [phone]; Fax: [phone]

## 2016-10-12 ENCOUNTER — Ambulatory Visit (HOSPITAL_COMMUNITY)
Admission: RE | Admit: 2016-10-12 | Discharge: 2016-10-12 | Disposition: A | Discharge status: Home / Self Care | Payer: Medicare Other | Source: Ambulatory Visit | Attending: Cardiology | Admitting: Cardiology

## 2016-10-12 DIAGNOSIS — I340 Nonrheumatic mitral (valve) insufficiency: Secondary | ICD-10-CM | POA: Insufficient documentation

## 2016-10-12 DIAGNOSIS — I358 Other nonrheumatic aortic valve disorders: Secondary | ICD-10-CM | POA: Diagnosis not present

## 2016-10-12 DIAGNOSIS — I119 Hypertensive heart disease without heart failure: Secondary | ICD-10-CM | POA: Diagnosis not present

## 2016-10-12 DIAGNOSIS — R0602 Shortness of breath: Secondary | ICD-10-CM | POA: Diagnosis not present

## 2016-10-12 DIAGNOSIS — I071 Rheumatic tricuspid insufficiency: Secondary | ICD-10-CM | POA: Insufficient documentation

## 2016-10-12 DIAGNOSIS — Z6841 Body Mass Index (BMI) 40.0 and over, adult: Secondary | ICD-10-CM | POA: Insufficient documentation

## 2016-10-12 DIAGNOSIS — E119 Type 2 diabetes mellitus without complications: Secondary | ICD-10-CM | POA: Diagnosis not present

## 2016-10-12 DIAGNOSIS — I77810 Thoracic aortic ectasia: Secondary | ICD-10-CM | POA: Diagnosis not present

## 2016-10-12 DIAGNOSIS — G4733 Obstructive sleep apnea (adult) (pediatric): Secondary | ICD-10-CM | POA: Diagnosis not present

## 2016-10-12 LAB — ECHOCARDIOGRAM COMPLETE
CHL CUP DOP CALC LVOT VTI: 28.2 cm
CHL CUP STROKE VOLUME: 58 mL
E decel time: 261 msec
E/e' ratio: 6.63
FS: 36 % (ref 28–44)
IV/PV OW: 1.16
LA diam end sys: 35 mm
LA vol A4C: 45.2 ml
LADIAMINDEX: 1.3 cm/m2
LASIZE: 35 mm
LAVOL: 58.4 mL
LAVOLIN: 21.7 mL/m2
LV E/e' medial: 6.63
LV SIMPSON'S DISK: 69
LV dias vol: 84 mL (ref 62–150)
LV sys vol: 26 mL (ref 21–61)
LVDIAVOLIN: 31 mL/m2
LVEEAVG: 6.63
LVELAT: 12.2 cm/s
LVOT area: 3.14 cm2
LVOT peak grad rest: 8 mmHg
LVOTD: 20 mm
LVOTPV: 138 cm/s
LVOTSV: 89 mL
LVSYSVOLIN: 10 mL/m2
Lateral S' vel: 16.5 cm/s
MV Dec: 261
MV Peak grad: 3 mmHg
MV pk A vel: 83.6 m/s
MVPKEVEL: 80.9 m/s
PW: 13.1 mm — AB (ref 0.6–1.1)
RV TAPSE: 32.5 mm
RV sys press: 34 mmHg
Reg peak vel: 279 cm/s
TDI e' lateral: 12.2
TDI e' medial: 6.42
TR max vel: 279 cm/s

## 2016-10-12 NOTE — Progress Notes (Signed)
*  PRELIMINARY RESULTS* Echocardiogram 2D Echocardiogram has been performed.  Colton Jackson 10/12/2016, 11:45 AM

## 2016-10-21 ENCOUNTER — Ambulatory Visit (INDEPENDENT_AMBULATORY_CARE_PROVIDER_SITE_OTHER): Payer: Medicare Other | Admitting: Internal Medicine

## 2016-10-21 DIAGNOSIS — R0609 Other forms of dyspnea: Secondary | ICD-10-CM

## 2016-10-21 LAB — PULMONARY FUNCTION TEST
DL/VA % PRED: 134 %
DL/VA: 6.02 ml/min/mmHg/L
DLCO UNC % PRED: 66 %
DLCO cor % pred: 68 %
DLCO cor: 20.3 ml/min/mmHg
DLCO unc: 19.58 ml/min/mmHg
FEF 25-75 PRE: 1.36 L/s
FEF 25-75 Post: 1.21 L/sec
FEF2575-%CHANGE-POST: -11 %
FEF2575-%Pred-Post: 47 %
FEF2575-%Pred-Pre: 53 %
FEV1-%Change-Post: -2 %
FEV1-%PRED-PRE: 57 %
FEV1-%Pred-Post: 55 %
FEV1-PRE: 1.6 L
FEV1-Post: 1.55 L
FEV1FVC-%CHANGE-POST: 11 %
FEV1FVC-%Pred-Pre: 101 %
FEV6-%CHANGE-POST: -10 %
FEV6-%PRED-POST: 51 %
FEV6-%PRED-PRE: 57 %
FEV6-POST: 1.79 L
FEV6-PRE: 2 L
FEV6FVC-%CHANGE-POST: 2 %
FEV6FVC-%PRED-PRE: 101 %
FEV6FVC-%Pred-Post: 104 %
FVC-%Change-Post: -12 %
FVC-%PRED-POST: 49 %
FVC-%Pred-Pre: 56 %
FVC-Post: 1.79 L
FVC-Pre: 2.04 L
POST FEV6/FVC RATIO: 100 %
Post FEV1/FVC ratio: 87 %
Pre FEV1/FVC ratio: 78 %
Pre FEV6/FVC Ratio: 98 %
RV % PRED: -35 %
RV: -0.79 L
TLC % pred: 44 %
TLC: 2.91 L

## 2016-10-27 DIAGNOSIS — N3941 Urge incontinence: Secondary | ICD-10-CM | POA: Diagnosis not present

## 2016-11-24 DIAGNOSIS — N3941 Urge incontinence: Secondary | ICD-10-CM | POA: Diagnosis not present

## 2016-12-22 DIAGNOSIS — N3941 Urge incontinence: Secondary | ICD-10-CM | POA: Diagnosis not present

## 2016-12-30 DIAGNOSIS — I10 Essential (primary) hypertension: Secondary | ICD-10-CM | POA: Diagnosis not present

## 2016-12-30 DIAGNOSIS — E109 Type 1 diabetes mellitus without complications: Secondary | ICD-10-CM | POA: Diagnosis not present

## 2016-12-30 DIAGNOSIS — E782 Mixed hyperlipidemia: Secondary | ICD-10-CM | POA: Diagnosis not present

## 2016-12-30 DIAGNOSIS — G8929 Other chronic pain: Secondary | ICD-10-CM | POA: Diagnosis not present

## 2016-12-30 DIAGNOSIS — Z6841 Body Mass Index (BMI) 40.0 and over, adult: Secondary | ICD-10-CM | POA: Diagnosis not present

## 2016-12-30 DIAGNOSIS — E1165 Type 2 diabetes mellitus with hyperglycemia: Secondary | ICD-10-CM | POA: Diagnosis not present

## 2017-01-12 DIAGNOSIS — N3941 Urge incontinence: Secondary | ICD-10-CM | POA: Diagnosis not present

## 2017-02-09 DIAGNOSIS — N3941 Urge incontinence: Secondary | ICD-10-CM | POA: Diagnosis not present

## 2017-03-18 DIAGNOSIS — N3941 Urge incontinence: Secondary | ICD-10-CM | POA: Diagnosis not present

## 2017-04-01 DIAGNOSIS — J019 Acute sinusitis, unspecified: Secondary | ICD-10-CM | POA: Diagnosis not present

## 2017-04-01 DIAGNOSIS — Z1389 Encounter for screening for other disorder: Secondary | ICD-10-CM | POA: Diagnosis not present

## 2017-04-01 DIAGNOSIS — N3941 Urge incontinence: Secondary | ICD-10-CM | POA: Diagnosis not present

## 2017-04-01 DIAGNOSIS — Z6841 Body Mass Index (BMI) 40.0 and over, adult: Secondary | ICD-10-CM | POA: Diagnosis not present

## 2017-04-01 DIAGNOSIS — J343 Hypertrophy of nasal turbinates: Secondary | ICD-10-CM | POA: Diagnosis not present

## 2017-04-01 DIAGNOSIS — E1165 Type 2 diabetes mellitus with hyperglycemia: Secondary | ICD-10-CM | POA: Diagnosis not present

## 2017-04-01 DIAGNOSIS — R070 Pain in throat: Secondary | ICD-10-CM | POA: Diagnosis not present

## 2017-04-05 DIAGNOSIS — I10 Essential (primary) hypertension: Secondary | ICD-10-CM | POA: Diagnosis not present

## 2017-04-05 DIAGNOSIS — E782 Mixed hyperlipidemia: Secondary | ICD-10-CM | POA: Diagnosis not present

## 2017-04-05 DIAGNOSIS — Z6841 Body Mass Index (BMI) 40.0 and over, adult: Secondary | ICD-10-CM | POA: Diagnosis not present

## 2017-04-05 DIAGNOSIS — G8929 Other chronic pain: Secondary | ICD-10-CM | POA: Diagnosis not present

## 2017-04-05 DIAGNOSIS — E119 Type 2 diabetes mellitus without complications: Secondary | ICD-10-CM | POA: Diagnosis not present

## 2017-04-06 DIAGNOSIS — N3941 Urge incontinence: Secondary | ICD-10-CM | POA: Diagnosis not present

## 2017-04-27 DIAGNOSIS — N3941 Urge incontinence: Secondary | ICD-10-CM | POA: Diagnosis not present

## 2017-05-12 DIAGNOSIS — Z0000 Encounter for general adult medical examination without abnormal findings: Secondary | ICD-10-CM | POA: Diagnosis not present

## 2017-05-12 DIAGNOSIS — Z1389 Encounter for screening for other disorder: Secondary | ICD-10-CM | POA: Diagnosis not present

## 2017-05-12 DIAGNOSIS — Z23 Encounter for immunization: Secondary | ICD-10-CM | POA: Diagnosis not present

## 2017-05-12 DIAGNOSIS — Z6841 Body Mass Index (BMI) 40.0 and over, adult: Secondary | ICD-10-CM | POA: Diagnosis not present

## 2017-05-18 DIAGNOSIS — N3941 Urge incontinence: Secondary | ICD-10-CM | POA: Diagnosis not present

## 2017-06-08 DIAGNOSIS — N3941 Urge incontinence: Secondary | ICD-10-CM | POA: Diagnosis not present

## 2017-06-17 DIAGNOSIS — E113392 Type 2 diabetes mellitus with moderate nonproliferative diabetic retinopathy without macular edema, left eye: Secondary | ICD-10-CM | POA: Diagnosis not present

## 2017-06-17 DIAGNOSIS — H43811 Vitreous degeneration, right eye: Secondary | ICD-10-CM | POA: Diagnosis not present

## 2017-06-17 DIAGNOSIS — E1165 Type 2 diabetes mellitus with hyperglycemia: Secondary | ICD-10-CM | POA: Diagnosis not present

## 2017-06-17 DIAGNOSIS — H401131 Primary open-angle glaucoma, bilateral, mild stage: Secondary | ICD-10-CM | POA: Diagnosis not present

## 2017-06-17 DIAGNOSIS — E113391 Type 2 diabetes mellitus with moderate nonproliferative diabetic retinopathy without macular edema, right eye: Secondary | ICD-10-CM | POA: Diagnosis not present

## 2017-06-29 DIAGNOSIS — N3941 Urge incontinence: Secondary | ICD-10-CM | POA: Diagnosis not present

## 2017-07-08 DIAGNOSIS — I10 Essential (primary) hypertension: Secondary | ICD-10-CM | POA: Diagnosis not present

## 2017-07-08 DIAGNOSIS — Z6841 Body Mass Index (BMI) 40.0 and over, adult: Secondary | ICD-10-CM | POA: Diagnosis not present

## 2017-07-08 DIAGNOSIS — E109 Type 1 diabetes mellitus without complications: Secondary | ICD-10-CM | POA: Diagnosis not present

## 2017-07-08 DIAGNOSIS — E782 Mixed hyperlipidemia: Secondary | ICD-10-CM | POA: Diagnosis not present

## 2017-07-08 DIAGNOSIS — L259 Unspecified contact dermatitis, unspecified cause: Secondary | ICD-10-CM | POA: Diagnosis not present

## 2017-07-08 DIAGNOSIS — G894 Chronic pain syndrome: Secondary | ICD-10-CM | POA: Diagnosis not present

## 2017-07-13 DIAGNOSIS — Z23 Encounter for immunization: Secondary | ICD-10-CM | POA: Diagnosis not present

## 2017-09-09 ENCOUNTER — Encounter: Payer: Self-pay | Admitting: Internal Medicine

## 2017-09-09 ENCOUNTER — Ambulatory Visit (HOSPITAL_BASED_OUTPATIENT_CLINIC_OR_DEPARTMENT_OTHER): Payer: Medicare Other | Attending: Internal Medicine | Admitting: Internal Medicine

## 2017-09-09 ENCOUNTER — Ambulatory Visit (INDEPENDENT_AMBULATORY_CARE_PROVIDER_SITE_OTHER): Payer: Medicare Other | Admitting: Internal Medicine

## 2017-09-09 ENCOUNTER — Ambulatory Visit (INDEPENDENT_AMBULATORY_CARE_PROVIDER_SITE_OTHER)
Admission: RE | Admit: 2017-09-09 | Discharge: 2017-09-09 | Disposition: A | Discharge status: Home / Self Care | Payer: Medicare Other | Source: Ambulatory Visit | Attending: Internal Medicine | Admitting: Internal Medicine

## 2017-09-09 VITALS — BP 158/82 | HR 71 | Ht 68.0 in | Wt 316.6 lb

## 2017-09-09 VITALS — Ht 68.0 in | Wt 316.0 lb

## 2017-09-09 DIAGNOSIS — J9611 Chronic respiratory failure with hypoxia: Secondary | ICD-10-CM | POA: Diagnosis not present

## 2017-09-09 DIAGNOSIS — G4733 Obstructive sleep apnea (adult) (pediatric): Secondary | ICD-10-CM | POA: Diagnosis not present

## 2017-09-09 DIAGNOSIS — Z23 Encounter for immunization: Secondary | ICD-10-CM

## 2017-09-09 DIAGNOSIS — R0609 Other forms of dyspnea: Secondary | ICD-10-CM

## 2017-09-09 DIAGNOSIS — E662 Morbid (severe) obesity with alveolar hypoventilation: Secondary | ICD-10-CM | POA: Diagnosis not present

## 2017-09-09 DIAGNOSIS — R0602 Shortness of breath: Secondary | ICD-10-CM | POA: Diagnosis not present

## 2017-09-09 NOTE — Patient Instructions (Addendum)
Order- CXR   Dx  Dyspnea on exertion  Walk test on room air- O2 qualifying  Order- schedule CPAP titration sleep study     Dx OSA  Flu shot - senior

## 2017-09-09 NOTE — Assessment & Plan Note (Signed)
He is desaturating with trivial exertion, corrected by supplemental oxygen. The primary problem is obesity hypoventilation syndrome although mild left-sided congestive heart failure is not excluded. This is not an acute event. Plan-start home oxygen 24/72 L with portable.

## 2017-09-09 NOTE — Assessment & Plan Note (Signed)
He has never accomplished significant lifestyle change for weight loss.

## 2017-09-09 NOTE — Assessment & Plan Note (Signed)
Download 100% compliance averaging 12 hours per day with AHI 10.7 reflecting a few central apneas as well as his obstructive events. He may eventually need BiPAP. Weight loss is emphasized. He sleeps much better with CPAP and considers it a stress fiesta sleep without it. Plan-schedule CPAP/BiPAP titration study

## 2017-09-09 NOTE — Progress Notes (Signed)
HPI- M never smoker followed for Allergic rhinitis, OSA, morbid obesity, obesity/ hypoventilation, complicated by back pain, DM, HBP. NPSG 09/22/14- AHI 72.7/ hr, CPAP to 19, weight 332 lbs PFT 10/21/16- Echocardiogram 10/12/16- --------------------------------------------------------------- 09/09/2016-66 year old male never smoker followed for Allergic rhinitis, OSA, obesity/hypoventilation, complicated by morbid obesity, back pain, DM, HBP CPAP 19/Advanced FOLLOWS FOR: DME: [facility]. Pt wears CPAP nightly for at least 8-10 hours; pressure working well for patient. Pt will need order for supplies today. DL attached. Notices gradually increasing dyspnea with exertion over the summer. Worked as a [name] for 20 years. Denies cough or wheeze. Had CT chest by urology one year ago. Report is not in our system. CPAP download shows excellent compliance and good control. He sleeps better with CPAP but is been having some moisture problems and his mask which we discussed.  09/09/17- 66 year old male never smoker followed for Allergic rhinitis, OSA, obesity/hypoventilation, complicated by morbid obesity, back pain, DM2, HBP CPAP 19/Advanced PFT in 2017 had shown severe obstruction and restriction with diffusion relatively high for alveolar volume. ---1-year follow up on CPAP.  Pt states that he has been doing okay, breathing has gotten a little worse. States that SOB has gotten worse with minimal  exertion. Denies any cough or CP. No complaints with CPAP machine or mask. DME: [facility]. Oxygen qualification 09/09/17-he desaturated to 82% on room air walking from bathroom. Corrected to 93% on 2 L while ambulating. CXR 09/09/16 IMPRESSION: 1. Vascular congestion with mild interstitial prominence. Consider a mild degree of congestive heart failure. 2. No evidence of pneumonia.  ROS-see HPI + = positive Constitutional:   No-   weight loss, night sweats, fevers, chills, +fatigue, lassitude. HEENT:   No-  headaches,  difficulty swallowing, tooth/dental problems, sore throat,       No-  sneezing, itching, ear ache, nasal congestion, post nasal drip,  CV:  No-   chest pain, orthopnea, PND, swelling in lower extremities, anasarca, dizziness, palpitations Resp: + shortness of breath with exertion or at rest.              No-   productive cough,  Occasional non-productive cough,  No-  coughing up of blood.              No-   change in color of mucus.  Rare wheezing.   Skin: No-   rash or lesions. GI:  No-   heartburn, indigestion, abdominal pain, nausea, vomiting,  GU:  MS:  +   joint pain or swelling., + back pain Neuro-  Psych:  No- change in mood or affect.  +depression or anxiety.  No memory loss.  OBJ- Physical Exam General- Alert, Oriented, Affect-appropriate, Distress- none acute, +morbidly obese Skin- rash-none, lesions- none, excoriation- none Lymphadenopathy- none Head- atraumatic            Eyes- Gross vision intact, PERRLA, conjunctivae and secretions clear            Ears- Hearing, canals-normal            Nose- Clear, no-Septal dev, mucus, polyps, erosion, perforation             Throat- Mallampati IV , mucosa clear , drainage- none, tonsils- atrophic, + mild hoarseness Neck- flexible , trachea midline, no stridor , thyroid nl, carotid no bruit Chest - symmetrical excursion , unlabored           Heart/CV- RRR , no murmur , no gallop  , no rub, nl s1 s2                           -  JVD- none , edema- none, stasis changes- none, varices- none           Lung- clear to P&A, + shallow consistent with body habitus, wheeze- none, cough- none , dullness-none, rub- none           Chest wall-  Abd- Br/ Gen/ Rectal- Not done, not indicated Extrem- cyanosis- none, clubbing, none, atrophy- none, strength- nl Neuro- grossly intact to observation

## 2017-09-09 NOTE — Assessment & Plan Note (Signed)
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 Uncertain component of low-grade chronic CHF. Plan-home oxygen is needed now. CXR. We will look at oxygen status when he has CPAP titration study.

## 2017-09-19 DIAGNOSIS — G4733 Obstructive sleep apnea (adult) (pediatric): Secondary | ICD-10-CM | POA: Diagnosis not present

## 2017-09-19 NOTE — Procedures (Signed)
Patient Name: Colton Jackson, Colton Jackson Date: 09/09/2017 Gender: Male D.O.B: 26-Apr-1951 Age (years): 66 Referring Provider: Baird Lyons MD, ABSM Height (inches): 68 Interpreting Physician: Baird Lyons MD, ABSM Weight (lbs): 316 RPSGT: Baxter Flattery BMI: 48 MRN: [id number] Neck Size: 20.00 CLINICAL INFORMATION The patient is referred for a CPAP titration to treat sleep apnea.  Date of NPSG, Split Night or HST:  09/18/14  AHI 72.7/ hr, desaturation to 81%, body weight 332 lbs  SLEEP STUDY TECHNIQUE As per the AASM Manual for the Scoring of Sleep and Associated Events v2.3 (April 2016) with a hypopnea requiring 4% desaturations.  The channels recorded and monitored were frontal, central and occipital EEG, electrooculogram (EOG), submentalis EMG (chin), nasal and oral airflow, thoracic and abdominal wall motion, anterior tibialis EMG, snore microphone, electrocardiogram, and pulse oximetry. Continuous positive airway pressure (CPAP) was initiated at the beginning of the study and titrated to treat sleep-disordered breathing.  MEDICATIONS Medications self-administered by patient taken the night of the study : CATAPRES, HYDROCODONE, KRILL OIL, MECLIZINE, MELOXICAM  TECHNICIAN COMMENTS Comments added by technician: Patient had difficulty initiating sleep.  Comments added by scorer: N/A  RESPIRATORY PARAMETERS Optimal PAP Pressure (cm): 15 AHI at Optimal Pressure (/hr): 2.7 Overall Minimal O2 (%): 83.00 Supine % at Optimal Pressure (%): 100 Minimal O2 at Optimal Pressure (%): 86.0 on O2 2L through study    SLEEP ARCHITECTURE The study was initiated at 11:11:46 PM and ended at 5:10:06 AM.  Sleep onset time was 2.5 minutes and the sleep efficiency was 73.5%. The total sleep time was 263.5 minutes.  The patient spent 14.61% of the night in stage N1 sleep, 69.83% in stage N2 sleep, 0.00% in stage N3 and 15.56% in REM.Stage REM latency was 215.0 minutes  Wake after sleep onset was 92.4. Alpha  intrusion was absent. Supine sleep was 100.00%.  CARDIAC DATA The 2 lead EKG demonstrated sinus rhythm. The mean heart rate was 68.69 beats per minute. Other EKG findings include: PVCs.  LEG MOVEMENT DATA The total Periodic Limb Movements of Sleep (PLMS) were 0. The PLMS index was 0.00. A PLMS index of <15 is considered normal in adults.  IMPRESSIONS - The optimal PAP pressure was 15 cm of water. - Central sleep apnea was not noted during this titration (CAI = 0.0/h). - Oxygen desaturations were observed during this titration (min O2 = 83.00%, Mean 89.6%), on O2 2L. 13.2 minutes below 88% at final pressure despite O2. - No snoring was audible during this study. - 2-lead EKG demonstrated: PVCs - Clinically significant periodic limb movements were not noted during this study. Arousals associated with PLMs were rare.  DIAGNOSIS - Obstructive Sleep Apnea (327.23 [G47.33 ICD-10]) - Nocturnal Hypoxemia (327.26 [G47.36 ICD-10])  RECOMMENDATIONS - Trial of CPAP therapy on 13 cm H2O with a Large size Fisher&Paykel Full Face Mask Simplus mask and heated humidification. - Supplemental oxygen necessary and should be increased to 3L during sleep. - Avoid alcohol, sedatives and other CNS depressants that may worsen sleep apnea and disrupt normal sleep architecture. - Sleep hygiene should be reviewed to assess factors that may improve sleep quality. - Weight management and regular exercise should be initiated or continued.  [Electronically signed] 09/19/2017 09:08 AM  Baird Lyons MD, [place], American Board of Sleep Medicine   NPI: [id number]

## 2017-09-20 ENCOUNTER — Telehealth: Payer: Self-pay | Admitting: Internal Medicine

## 2017-09-20 NOTE — Telephone Encounter (Signed)
Spoke with Melissa at [facility]. Pt was set up with filling system at home-he is able to fill his smaller E-tanks (which should last up to 4 hours at 2lpm).   [facility] has set up appt for patient to do a  best eval to see if pt is able to tolerate conserving device. Nothing from our office is needed at this time as patient contacted [facility] as well.

## 2017-09-21 ENCOUNTER — Telehealth: Payer: Self-pay | Admitting: Internal Medicine

## 2017-09-21 NOTE — Telephone Encounter (Signed)
Spoke with patient. He wanted to know if the order that was sent over to [facility] stated if he should be on pulsated oxygen or continuous oxygen. Advised patient that the order states for him to be on continuous oxygen. He verbalized understanding. Nothing else needed at time of call.

## 2017-10-09 ENCOUNTER — Encounter (HOSPITAL_COMMUNITY): Payer: Self-pay

## 2017-10-09 ENCOUNTER — Other Ambulatory Visit: Payer: Self-pay

## 2017-10-09 ENCOUNTER — Emergency Department (HOSPITAL_COMMUNITY)
Admission: EM | Admit: 2017-10-09 | Discharge: 2017-10-09 | Disposition: A | Discharge status: Home / Self Care | Payer: Medicare Other | Attending: Emergency Medicine | Admitting: Emergency Medicine

## 2017-10-09 ENCOUNTER — Emergency Department (HOSPITAL_COMMUNITY): Payer: Medicare Other

## 2017-10-09 ENCOUNTER — Telehealth: Payer: Self-pay | Admitting: Critical Care Medicine

## 2017-10-09 DIAGNOSIS — Z79811 Long term (current) use of aromatase inhibitors: Secondary | ICD-10-CM | POA: Diagnosis not present

## 2017-10-09 DIAGNOSIS — Z7982 Long term (current) use of aspirin: Secondary | ICD-10-CM | POA: Insufficient documentation

## 2017-10-09 DIAGNOSIS — E119 Type 2 diabetes mellitus without complications: Secondary | ICD-10-CM | POA: Diagnosis not present

## 2017-10-09 DIAGNOSIS — Z882 Allergy status to sulfonamides status: Secondary | ICD-10-CM | POA: Insufficient documentation

## 2017-10-09 DIAGNOSIS — I10 Essential (primary) hypertension: Secondary | ICD-10-CM | POA: Insufficient documentation

## 2017-10-09 DIAGNOSIS — Z794 Long term (current) use of insulin: Secondary | ICD-10-CM | POA: Diagnosis not present

## 2017-10-09 DIAGNOSIS — Z7984 Long term (current) use of oral hypoglycemic drugs: Secondary | ICD-10-CM | POA: Diagnosis not present

## 2017-10-09 DIAGNOSIS — B370 Candidal stomatitis: Secondary | ICD-10-CM

## 2017-10-09 DIAGNOSIS — R0600 Dyspnea, unspecified: Secondary | ICD-10-CM | POA: Insufficient documentation

## 2017-10-09 DIAGNOSIS — Z79899 Other long term (current) drug therapy: Secondary | ICD-10-CM | POA: Diagnosis not present

## 2017-10-09 DIAGNOSIS — R0602 Shortness of breath: Secondary | ICD-10-CM | POA: Diagnosis not present

## 2017-10-09 LAB — CBC
HCT: 42.6 % (ref 39.0–52.0)
Hemoglobin: 14.3 g/dL (ref 13.0–17.0)
MCH: 30.4 pg (ref 26.0–34.0)
MCHC: 33.6 g/dL (ref 30.0–36.0)
MCV: 90.6 fL (ref 78.0–100.0)
Platelets: 265 10*3/uL (ref 150–400)
RBC: 4.7 MIL/uL (ref 4.22–5.81)
RDW: 13.4 % (ref 11.5–15.5)
WBC: 7.6 10*3/uL (ref 4.0–10.5)

## 2017-10-09 LAB — BASIC METABOLIC PANEL
Anion gap: 9 (ref 5–15)
BUN: 14 mg/dL (ref 6–20)
CO2: 30 mmol/L (ref 22–32)
Calcium: 9.4 mg/dL (ref 8.9–10.3)
Chloride: 98 mmol/L — ABNORMAL LOW (ref 101–111)
Creatinine, Ser: 0.76 mg/dL (ref 0.61–1.24)
GFR calc Af Amer: >60 mL/min (ref >60)
GFR calc non Af Amer: >60 mL/min (ref >60)
Glucose, Bld: 178 mg/dL — ABNORMAL HIGH (ref 65–99)
Potassium: 3.6 mmol/L (ref 3.5–5.1)
Sodium: 137 mmol/L (ref 135–145)

## 2017-10-09 LAB — BRAIN NATRIURETIC PEPTIDE: B Natriuretic Peptide: 20.5 pg/mL (ref 0.0–100.0)

## 2017-10-09 LAB — I-STAT TROPONIN, ED: Troponin i, poc: 0 ng/mL (ref 0.00–0.08)

## 2017-10-09 MED ORDER — NYSTATIN 100000 UNIT/ML MT SUSP: 500000.0000 [IU] | Freq: Four times a day (QID) | OROMUCOSAL | 0 refills | Status: AC

## 2017-10-09 MED ORDER — NYSTATIN 100000 UNIT/ML MT SUSP
5.0000 mL | Freq: Once | OROMUCOSAL | Status: AC
  Administered 2017-10-09: 500000 [IU] via OROMUCOSAL
  Filled 2017-10-09: qty 5

## 2017-10-09 MED ORDER — FLUCONAZOLE 100 MG PO TABS
200.0000 mg | ORAL_TABLET | Freq: Once | ORAL | Status: AC
  Administered 2017-10-09: 200 mg via ORAL
  Filled 2017-10-09: qty 2

## 2017-10-09 MED ORDER — NYSTATIN 100000 UNIT/ML MT SUSP: 500000.0000 [IU] | Freq: Four times a day (QID) | OROMUCOSAL | 0 refills | Status: DC

## 2017-10-09 NOTE — ED Provider Notes (Addendum)
[facility] EMERGENCY DEPARTMENT Provider Note   CSN: [id number] Arrival date & time: 10/09/17  3299     History   Chief Complaint Chief Complaint  Patient presents with  . Shortness of Breath    HPI Colton Chandley. is a 66 y.o. male.  HPI   65 year old male with dyspnea.  He woken from sleep earlier this morning with a sensation that he cannot breathe.  He felt like he had something stuck in his upper chest/lower neck.  He sleeps on CPAP.  He feels like she and was functioning appropriately.  Sensation has since improved.  He currently has no complaints.  He went to sleep in his usual state of health.  He has mild lower extremity edema, but states that this is stable.  No fevers or chills.  No cough.  He has been complained of a sore throat recently.  Past Medical History:  Diagnosis Date  . Allergic rhinitis   . Degenerative disk disease   . Essential hypertension   . H/O cardiovascular stress test 02/02/11   EF 59% - Normal stress test  . Knee pain, bilateral   . Lower back pain   . Morbid obesity ([place])   . OSA (obstructive sleep apnea)    On CPAP  . Type 2 diabetes mellitus [facility])     Patient Active Problem List   Diagnosis Date Noted  . Dyspnea on exertion 09/09/2016  . Acute bronchitis 10/01/2015  . Obesity hypoventilation syndrome ([place]) 12/21/2011  . Degenerative disc disease 12/21/2011  . DIABETES MELLITUS 06/26/2008  . MORBID OBESITY 06/25/2008  . Obstructive sleep apnea 06/25/2008  . HYPERTENSION 06/25/2008  . ALLERGIC RHINITIS 06/25/2008  . BACK PAIN, CHRONIC 06/25/2008    Past Surgical History:  Procedure Laterality Date  . APPENDECTOMY    . Cataract surgery Bilateral   . KNEE ARTHROSCOPY Bilateral   . NASAL SEPTOPLASTY W/ TURBINOPLASTY         Home Medications    Prior to Admission medications   Medication Sig Start Date End Date Taking? Authorizing Provider  amLODipine (NORVASC) 5 MG tablet Take 5 mg by mouth daily.     [provider]  aspirin 325 MG tablet Take 325 mg by mouth daily.      [provider]  cloNIDine (CATAPRES) 0.2 MG tablet Take 0.2 mg by mouth 3 (three) times daily.      [provider]  cromolyn (NASALCROM) 5.2 MG/ACT nasal spray Place 1 spray into the nose 4 (four) times daily as needed.     [provider]  glipiZIDE (GLUCOTROL) 10 MG tablet Take 10 mg by mouth 2 (two) times daily before a meal.      [provider]  hydrochlorothiazide (HYDRODIURIL) 25 MG tablet Take 25 mg by mouth daily.      [provider]  HYDROcodone-acetaminophen (LORCET) 10-650 MG per tablet Take 1 tablet by mouth 2 (two) times daily as needed.    [provider]  insulin detemir (LEVEMIR) 100 UNIT/ML injection Inject 58 Units into the skin at bedtime.    [provider]  KRILL OIL PO Take 1 tablet by mouth 2 (two) times daily.    [provider]  latanoprost (XALATAN) 0.005 % ophthalmic solution Place 1 drop into both eyes at bedtime.      [provider]  losartan (COZAAR) 100 MG tablet Take 100 mg by mouth daily.    [provider]  meclizine (ANTIVERT) 25 MG  tablet Take 25 mg by mouth 3 (three) times daily as needed.    [provider]  meloxicam (MOBIC) 7.5 MG tablet Take 7.5 mg by mouth 2 (two) times daily as needed for pain.    [provider]  metFORMIN (GLUMETZA) 500 MG (MOD) 24 hr tablet Take 1,000 mg by mouth 2 (two) times daily.     [provider]  metoprolol (TOPROL-XL) 50 MG 24 hr tablet Take 50 mg by mouth daily.     [provider]  solifenacin (VESICARE) 5 MG tablet Take 5 mg by mouth daily.    [provider]    Family History Family History  Problem Relation Age of Onset  . Lung disease Father   . Stroke Father 66  . Diabetes Father   . Diabetes Maternal Grandfather   . Heart disease Paternal Grandmother   . Stroke Paternal Grandfather 78  .  Diabetes Paternal Grandfather   . Diabetes Mother   . Cancer Mother   . Stroke Mother 12  . Diabetes Sister   . Diabetes Brother 47    Social History Social History   Tobacco Use  . Smoking status: Never Smoker  . Smokeless tobacco: Never Used  . Tobacco comment: Passive tobacco exposure  Substance Use Topics  . Alcohol use: No  . Drug use: No     Allergies   Sulfa antibiotics and Sulfonamide derivatives   Review of Systems Review of Systems  All systems reviewed and negative, other than as noted in HPI.  Physical Exam Updated Vital Signs BP (!) 159/71   Pulse 64   Temp 98.9 F (37.2 C)   Resp 20   Wt (!) 140.7 kg (310 lb 4 oz)   SpO2 97%   BMI 47.17 kg/m   Physical Exam  Constitutional: He appears well-developed and well-nourished. No distress.  Sitting in bed.  No acute distress.  Obese.  HENT:  Head: Normocephalic and atraumatic.  Thrush noted to base of the tongue and posterior pharynx.  Eyes: Conjunctivae are normal. Right eye exhibits no discharge. Left eye exhibits no discharge.  Neck: Neck supple.  Cardiovascular: Normal rate, regular rhythm and normal heart sounds. Exam reveals no gallop and no friction rub.  No murmur heard. Pulmonary/Chest: Effort normal and breath sounds normal. No respiratory distress.  Abdominal: Soft. He exhibits no distension. There is no tenderness.  Musculoskeletal: He exhibits no edema or tenderness.  Lower extremities symmetric as compared to each other. Mild edema. No calf tenderness. Negative Homan's. No palpable cords.   Neurological: He is alert.  Skin: Skin is warm and dry.  Psychiatric: He has a normal mood and affect. His behavior is normal. Thought content normal.  Nursing note and vitals reviewed.    ED Treatments / Results  Labs (all labs ordered are listed, but only abnormal results are displayed) Labs Reviewed  BASIC METABOLIC PANEL - Abnormal; Notable for the following components:      Result Value     Chloride 98 (*)    Glucose, Bld 178 (*)    All other components within normal limits  CBC  BRAIN NATRIURETIC PEPTIDE  I-STAT TROPONIN, ED    EKG  EKG Interpretation  Date/Time:  Saturday October 09 2017 07:25:31 EST Ventricular Rate:  67 PR Interval:  176 QRS Duration: 86 QT Interval:  386 QTC Calculation: 407 R Axis:   72 Text Interpretation:  Normal sinus rhythm Baseline wander Confirmed by Virgel Manifold [phone]) on 10/09/2017 7:43:21 AM  Radiology Dg Chest 2 View  Result Date: 10/09/2017 CLINICAL DATA:  Shortness of breath. EXAM: CHEST  2 VIEW COMPARISON:  Radiographs of September 09, 2017. FINDINGS: Stable cardiomegaly and central pulmonary vascular congestion is noted. No pneumothorax or pleural effusion is noted. No consolidative process is noted. Bony thorax is unremarkable. IMPRESSION: Stable cardiomegaly and central pulmonary vascular congestion. No other significant abnormality seen. Electronically Signed   By: Marijo Conception, M.D.   On: 10/09/2017 08:35    Procedures Procedures (including critical care time)  Medications Ordered in ED Medications  fluconazole (DIFLUCAN) tablet 200 mg (not administered)  nystatin (MYCOSTATIN) 100000 UNIT/ML suspension 500,000 Units (not administered)     Initial Impression / Assessment and Plan / ED Course  I have reviewed the triage vital signs and the nursing notes.  Pertinent labs & imaging results that were available during my care of the patient were reviewed by me and considered in my medical decision making (see chart for details).     66 year old male with dyspnea, now resolved.  Unsure as to exact etiology.  He describes a sensation as if he had something stuck in his throat.  He does have thrush on exam.  Probably does have some esophageal involvement given his description of sore throat/raw feeling. This is likely contributing.   EKG without acute changes.  Chest x-ray is clear.  He is afebrile.  Oxygen  saturations are normal on 2 L.  I do not appreciate any adventitious breath sounds. BNP is normal.   Final Clinical Impressions(s) / ED Diagnoses   Final diagnoses:  Thrush  Dyspnea, unspecified type    ED Discharge Orders    None       Virgel Manifold, MD 10/09/17 [id number]    Virgel Manifold, MD 10/09/17 [phone]

## 2017-10-09 NOTE — Telephone Encounter (Signed)
Call received through answering service from patient.  Reports increasing SOB since Thursday.  He feels like he has fluid in his lungs and cannot breath.  He normally uses CPAP at night and states he feels he cannot breath through it.  He has been using O2 nasal cannula instead.  He admits to SOB at rest and exertion, some wheezing, but no chest pain other than tightness.  Patient can talk but in short sentences.  He has no overt wheezing.  I have recommended that that patient go to a local ER for evaluation.  He prefers to come to [facility].  Disposition:  To go to ED for evaluation and treatment

## 2017-10-09 NOTE — ED Triage Notes (Signed)
Patient reports that he was awakened from sleep and felt as if he couldn't get a good breath, uses C-pap at night and machine was functioning fine. Reports that he thinks some congestion moved and states breathing now better, experienced some chest tightness with same. Reports has been on home continuous oxygen x 1 week

## 2017-10-11 DIAGNOSIS — Z125 Encounter for screening for malignant neoplasm of prostate: Secondary | ICD-10-CM | POA: Diagnosis not present

## 2017-10-11 DIAGNOSIS — G4733 Obstructive sleep apnea (adult) (pediatric): Secondary | ICD-10-CM | POA: Diagnosis not present

## 2017-10-11 DIAGNOSIS — G894 Chronic pain syndrome: Secondary | ICD-10-CM | POA: Diagnosis not present

## 2017-10-11 DIAGNOSIS — Z6841 Body Mass Index (BMI) 40.0 and over, adult: Secondary | ICD-10-CM | POA: Diagnosis not present

## 2017-10-11 DIAGNOSIS — J449 Chronic obstructive pulmonary disease, unspecified: Secondary | ICD-10-CM | POA: Diagnosis not present

## 2017-10-11 DIAGNOSIS — E119 Type 2 diabetes mellitus without complications: Secondary | ICD-10-CM | POA: Diagnosis not present

## 2017-10-11 DIAGNOSIS — I5030 Unspecified diastolic (congestive) heart failure: Secondary | ICD-10-CM | POA: Diagnosis not present

## 2017-10-11 DIAGNOSIS — Z1389 Encounter for screening for other disorder: Secondary | ICD-10-CM | POA: Diagnosis not present

## 2017-10-15 ENCOUNTER — Ambulatory Visit (INDEPENDENT_AMBULATORY_CARE_PROVIDER_SITE_OTHER): Payer: Medicare Other | Admitting: Urology

## 2017-10-15 DIAGNOSIS — N5201 Erectile dysfunction due to arterial insufficiency: Secondary | ICD-10-CM

## 2017-10-15 DIAGNOSIS — R32 Unspecified urinary incontinence: Secondary | ICD-10-CM | POA: Diagnosis not present

## 2017-10-22 ENCOUNTER — Telehealth: Payer: Self-pay | Admitting: Internal Medicine

## 2017-10-22 MED ORDER — FLUCONAZOLE 150 MG PO TABS: 150.0000 mg | ORAL_TABLET | Freq: Every day | ORAL | 0 refills | Status: DC

## 2017-10-22 NOTE — Telephone Encounter (Signed)
CPAP won't cause thrush.  Ok to offer Diflucan 150 mg, # 7, 1 daily

## 2017-10-22 NOTE — Telephone Encounter (Signed)
Called and spoke with pt and he is aware of CY recs.  Diflucan has been sent to his pharmacy and pt is aware.

## 2017-10-22 NOTE — Telephone Encounter (Signed)
Called and spoke with pt and he stated that he has a bad case of thrush.  He was seen in the ER on 11/24 and DX with thrush and was given nystatin oral solution.  He has been using this but stated that when he gets up in the morning after using the cpap this is worse.  He stated that some nights he has to take the cpap off and use the oxygen since he feels that his throat is closing.  CY please advise. Thanks  Allergies  Allergen Reactions  . Sulfa Antibiotics   . Sulfonamide Derivatives

## 2017-12-09 ENCOUNTER — Telehealth: Payer: Self-pay | Admitting: Internal Medicine

## 2017-12-09 MED ORDER — AMOXICILLIN-POT CLAVULANATE 875-125 MG PO TABS: 1.0000 | ORAL_TABLET | Freq: Two times a day (BID) | ORAL | 0 refills | Status: DC

## 2017-12-09 NOTE — Telephone Encounter (Signed)
Pt c/o increased sob, nasal and sinus congestion, PND Xseveral days.  Denies fever, chest pain, body aches.    Pt has been taking saline nasal spray which isn't helping s/s.  Requesting additional recs.  Pt expressed an interest in a "sinus lavage" and would like CY's recs on this as well.  Pt uses [facility] in [place] please advise.  Thanks.

## 2017-12-09 NOTE — Telephone Encounter (Signed)
Offer augmentin 875 mg, # 14, 1 twice daily  Suggest a nasal rinse squeeze bottle, like [facility].    Ok to try one of the electric nasal lavage devices if he wants- I don't have much experience with those.

## 2017-12-09 NOTE — Telephone Encounter (Signed)
Pt aware of recs.  rx sent to preferred pharmacy.  Nothing further needed.  

## 2017-12-13 ENCOUNTER — Telehealth: Payer: Self-pay | Admitting: Internal Medicine

## 2017-12-13 MED ORDER — AMOXICILLIN-POT CLAVULANATE 875-125 MG PO TABS: 1.0000 | ORAL_TABLET | Freq: Two times a day (BID) | ORAL | 0 refills | Status: DC

## 2017-12-13 NOTE — Telephone Encounter (Signed)
Called pt who states his abx was sent to the wrong pharmacy.  Verified pharmacy with pt that the abx needed to go to.  Resent abx to pt's correct pharmacy.  Nothing further needed at this current time.

## 2018-01-06 ENCOUNTER — Encounter: Payer: Self-pay | Admitting: Internal Medicine

## 2018-01-10 ENCOUNTER — Ambulatory Visit (INDEPENDENT_AMBULATORY_CARE_PROVIDER_SITE_OTHER): Payer: Medicare Other | Admitting: Internal Medicine

## 2018-01-10 ENCOUNTER — Encounter: Payer: Self-pay | Admitting: Internal Medicine

## 2018-01-10 VITALS — BP 138/78 | HR 70 | Ht 68.0 in | Wt 308.8 lb

## 2018-01-10 DIAGNOSIS — R040 Epistaxis: Secondary | ICD-10-CM | POA: Diagnosis not present

## 2018-01-10 DIAGNOSIS — J4540 Moderate persistent asthma, uncomplicated: Secondary | ICD-10-CM | POA: Diagnosis not present

## 2018-01-10 DIAGNOSIS — E662 Morbid (severe) obesity with alveolar hypoventilation: Secondary | ICD-10-CM | POA: Diagnosis not present

## 2018-01-10 DIAGNOSIS — R042 Hemoptysis: Secondary | ICD-10-CM

## 2018-01-10 DIAGNOSIS — G4733 Obstructive sleep apnea (adult) (pediatric): Secondary | ICD-10-CM | POA: Diagnosis not present

## 2018-01-10 MED ORDER — UMECLIDINIUM-VILANTEROL 62.5-25 MCG/INH IN AEPB: 1.0000 | INHALATION_SPRAY | Freq: Every day | RESPIRATORY_TRACT | 0 refills | Status: DC

## 2018-01-10 NOTE — Patient Instructions (Signed)
Sample Anoro   Inhale 1 puff, once daily     See if this helps your breathing  Order- CT chest no contrast      Dx hemoptysis  Order- limited CT max/ fac   Dx epistaxis

## 2018-01-10 NOTE — Progress Notes (Signed)
Patient seen in the office today and instructed on use of Anoro.  Patient expressed understanding and demonstrated technique.-Lauren Reynolds RN    HPI- M never smoker followed for Allergic rhinitis, OSA, morbid obesity, obesity/ hypoventilation, complicated by back pain, DM, HBP, glaucoma NPSG 09/22/14- AHI 72.7/ hr, CPAP to 19, weight 332 lbs PFT 10/21/16- Echocardiogram 10/12/16- -------------------------------------------------------------- 09/09/17- 67 year old male never smoker followed for Allergic rhinitis, OSA, obesity/hypoventilation, complicated by morbid obesity, back pain, DM2, HBP CPAP 19/Advanced PFT in 2017 had shown severe obstruction and restriction with diffusion relatively high for alveolar volume. ---1-year follow up on CPAP.  Pt states that he has been doing okay, breathing has gotten a little worse. States that SOB has gotten worse with minimal  exertion. Denies any cough or CP. No complaints with CPAP machine or mask. DME: [facility]. Oxygen qualification 09/09/17-he desaturated to 82% on room air walking from bathroom. Corrected to 93% on 2 L while ambulating. CXR 09/09/16 IMPRESSION: 1. Vascular congestion with mild interstitial prominence. Consider a mild degree of congestive heart failure. 2. No evidence of pneumonia.  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  Possibly fixed obstructive airways disease from remote asthma??. ----DOE, home O2, OSA, cough slight sputum blood tinged, stuffy nose, been blowing nose has some small clots, no wheezing CPAP download 97% compliance, AHI 14.3/hour-not specified central/obstructive.  High leak probably explains the lack of control. Occasional cough but produces small blood  clot.  Hard to get clear description but it seems mostly this is from his nose.  He is not on warfarin.  Denies chest pain.  Does not want prednisone. BNP 10/09/17- 20.5   WNL CXR 10/09/17 IMPRESSION: Stable cardiomegaly and central pulmonary vascular congestion. No other significant abnormality seen.  ROS-see HPI + = positive Constitutional:   No-   weight loss, night sweats, fevers, chills, +fatigue, lassitude. HEENT:   No-  headaches, difficulty swallowing, tooth/dental problems, sore throat,       No-  sneezing, itching, ear ache, nasal congestion, post nasal drip,  CV:  No-   chest pain, orthopnea, PND, swelling in lower extremities, anasarca, dizziness, palpitations Resp: + shortness of breath with exertion or at rest.              No-   productive cough,  Occasional non-productive cough,  No-  coughing up of blood.              No-   change in color of mucus.  Rare wheezing.   Skin: No-   rash or lesions. GI:  No-   heartburn, indigestion, abdominal pain, nausea, vomiting,  GU:  MS:  +   joint pain or swelling., + back pain Neuro-  Psych:  No- change in mood or affect.  +depression or anxiety.  No memory loss.  OBJ- Physical Exam    O2 2 L General- Alert, Oriented, Affect-appropriate, Distress- none acute, +morbidly obese Skin- rash-none, lesions- none, excoriation- none Lymphadenopathy- none Head- atraumatic            Eyes- Gross vision intact, PERRLA, conjunctivae and secretions clear            Ears- Hearing, canals-normal            Nose-  no-Septal dev, mucus+, No-polyps, erosion, perforation  Throat- Mallampati IV , mucosa clear , drainage- none, tonsils- atrophic, + mild hoarseness Neck- flexible , trachea midline, no stridor , thyroid nl, carotid no bruit Chest - symmetrical excursion , unlabored           Heart/CV- RRR , no murmur , no gallop  , no rub, nl s1 s2                           - JVD- none , edema- none, stasis changes- none, varices- none            Lung- clear to P&A, + shallow consistent with body habitus, wheeze- none, cough- none , dullness-none, rub- none           Chest wall-  Abd- Br/ Gen/ Rectal- Not done, not indicated Extrem- cyanosis- none, clubbing, none, atrophy- none, strength- nl Neuro- grossly intact to observation

## 2018-01-11 DIAGNOSIS — G4733 Obstructive sleep apnea (adult) (pediatric): Secondary | ICD-10-CM | POA: Diagnosis not present

## 2018-01-11 DIAGNOSIS — E109 Type 1 diabetes mellitus without complications: Secondary | ICD-10-CM | POA: Diagnosis not present

## 2018-01-11 DIAGNOSIS — E119 Type 2 diabetes mellitus without complications: Secondary | ICD-10-CM | POA: Diagnosis not present

## 2018-01-11 DIAGNOSIS — G8929 Other chronic pain: Secondary | ICD-10-CM | POA: Diagnosis not present

## 2018-01-11 DIAGNOSIS — Z6841 Body Mass Index (BMI) 40.0 and over, adult: Secondary | ICD-10-CM | POA: Diagnosis not present

## 2018-01-11 DIAGNOSIS — G894 Chronic pain syndrome: Secondary | ICD-10-CM | POA: Diagnosis not present

## 2018-01-11 DIAGNOSIS — I10 Essential (primary) hypertension: Secondary | ICD-10-CM | POA: Diagnosis not present

## 2018-01-11 DIAGNOSIS — Z1389 Encounter for screening for other disorder: Secondary | ICD-10-CM | POA: Diagnosis not present

## 2018-01-11 DIAGNOSIS — E782 Mixed hyperlipidemia: Secondary | ICD-10-CM | POA: Diagnosis not present

## 2018-01-12 DIAGNOSIS — J4540 Moderate persistent asthma, uncomplicated: Secondary | ICD-10-CM | POA: Insufficient documentation

## 2018-01-12 NOTE — Assessment & Plan Note (Signed)
I do not think he would be a candidate for bariatric surgery at this point but counseling would help.

## 2018-01-12 NOTE — Assessment & Plan Note (Signed)
Machine is not old enough for replacement.  Compliance is good.  High pressures associated with leak.  Hopefully we can refit his mask for better control.

## 2018-01-12 NOTE — Assessment & Plan Note (Addendum)
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  This is complicated by obesity hypoventilation.  Recent hemoptysis versus epistaxis. Plan-CT chest and sinuses looking for source of bleeding.  Minimize aspirin.  Sample Anoro

## 2018-01-12 NOTE — Assessment & Plan Note (Signed)
Importance of regaining normal body weight was emphasized.  He has been very heavy as long as I have known him.

## 2018-01-18 ENCOUNTER — Ambulatory Visit (INDEPENDENT_AMBULATORY_CARE_PROVIDER_SITE_OTHER)
Admission: RE | Admit: 2018-01-18 | Discharge: 2018-01-18 | Disposition: A | Discharge status: Home / Self Care | Payer: Medicare Other | Source: Ambulatory Visit | Attending: Internal Medicine | Admitting: Internal Medicine

## 2018-01-18 ENCOUNTER — Other Ambulatory Visit: Payer: Self-pay | Admitting: Internal Medicine

## 2018-01-18 DIAGNOSIS — J439 Emphysema, unspecified: Secondary | ICD-10-CM | POA: Diagnosis not present

## 2018-01-18 DIAGNOSIS — R040 Epistaxis: Secondary | ICD-10-CM | POA: Diagnosis not present

## 2018-01-18 DIAGNOSIS — R042 Hemoptysis: Secondary | ICD-10-CM

## 2018-01-18 DIAGNOSIS — R918 Other nonspecific abnormal finding of lung field: Secondary | ICD-10-CM

## 2018-01-18 DIAGNOSIS — R05 Cough: Secondary | ICD-10-CM | POA: Diagnosis not present

## 2018-01-18 DIAGNOSIS — R0609 Other forms of dyspnea: Principal | ICD-10-CM

## 2018-01-18 NOTE — Progress Notes (Signed)
Orders placed per CY result note.

## 2018-01-19 ENCOUNTER — Telehealth: Payer: Self-pay | Admitting: Internal Medicine

## 2018-01-19 NOTE — Telephone Encounter (Signed)
I had already scheduled pt's echo & had left him a message to call me for appt info.  I called pt back & gave him details - [facility] [place] at [facility] for 3/11 at 11:30.  Nothing further needed.

## 2018-01-24 ENCOUNTER — Ambulatory Visit (HOSPITAL_COMMUNITY)
Admission: RE | Admit: 2018-01-24 | Discharge: 2018-01-24 | Disposition: A | Discharge status: Home / Self Care | Payer: Medicare Other | Source: Ambulatory Visit | Attending: Internal Medicine | Admitting: Internal Medicine

## 2018-01-24 DIAGNOSIS — R0609 Other forms of dyspnea: Secondary | ICD-10-CM | POA: Diagnosis not present

## 2018-01-24 DIAGNOSIS — I082 Rheumatic disorders of both aortic and tricuspid valves: Secondary | ICD-10-CM | POA: Diagnosis not present

## 2018-01-24 DIAGNOSIS — I10 Essential (primary) hypertension: Secondary | ICD-10-CM | POA: Insufficient documentation

## 2018-01-24 DIAGNOSIS — E119 Type 2 diabetes mellitus without complications: Secondary | ICD-10-CM | POA: Diagnosis not present

## 2018-01-24 DIAGNOSIS — G4733 Obstructive sleep apnea (adult) (pediatric): Secondary | ICD-10-CM | POA: Insufficient documentation

## 2018-01-24 NOTE — Progress Notes (Signed)
*  PRELIMINARY RESULTS* Echocardiogram 2D Echocardiogram has been performed.  Colton Jackson 01/24/2018, 1:00 PM

## 2018-01-27 ENCOUNTER — Other Ambulatory Visit: Payer: Self-pay

## 2018-01-27 DIAGNOSIS — I272 Pulmonary hypertension, unspecified: Secondary | ICD-10-CM

## 2018-03-08 NOTE — Progress Notes (Signed)
Cardiology Office Note  Date: 03/10/2018   ID: Colton Jackson., DOB 27-Jan-1951, MRN [id number]  PCP: Sharilyn Sites, MD  Primary Cardiologist: Rozann Lesches, MD   Chief Complaint  Patient presents with  . Pulmonary hypertension    History of Present Illness: Colton Jackson. is a 67 y.o. male I saw in consultation back in November 2017.  He is referred back to the office by [name] with recent echocardiogram showing severe pulmonary hypertension.  He has a known history of OSA on CPAP, previous PFTs in 2017 demonstrating severe obstruction and restriction.  He tells me that over the last 6 months he has been increasingly more short of breath with activity, NYHA class III at this point, despite wearing supplemental oxygen at 2 L nasal cannula.  He also reports compliance with CPAP at nighttime.  He has not had any obvious angina symptoms, no palpitations or syncope.  Occasional cough, no progressive hemoptysis, no fevers or chills.  I reviewed his recent echocardiogram from March which showed LVEF 65-70% with normal diastolic parameters, mild tricuspid regurgitation, estimated PASP 72 mmHg.  Previous PASP was 46 mmHg in 2017.  Recent chest CT demonstrated mild emphysematous changes with enlarged pulmonary arteries, granulomatous changes in the mediastinal and hilar lymph nodes, and a potentially new perifissural 7 mm nodule.  He also reports chronic leg edema, uses compression stockings.  He is not on diuretic therapy other than HCTZ at this point.  Past Medical History:  Diagnosis Date  . Allergic rhinitis   . Degenerative disk disease   . Essential hypertension   . H/O cardiovascular stress test 02/02/11   EF 59% - Normal stress test  . Knee pain, bilateral   . Lower back pain   . Morbid obesity ([place])   . OSA (obstructive sleep apnea)    On CPAP  . Type 2 diabetes mellitus ([place])     Past Surgical History:  Procedure Laterality Date  . APPENDECTOMY    . Cataract surgery  Bilateral   . KNEE ARTHROSCOPY Bilateral   . NASAL SEPTOPLASTY W/ TURBINOPLASTY      Current Outpatient Medications  Medication Sig Dispense Refill  . amLODipine (NORVASC) 5 MG tablet Take 5 mg by mouth daily.    Marland Kitchen aspirin 325 MG tablet Take 325 mg by mouth daily.      . cloNIDine (CATAPRES) 0.2 MG tablet Take 0.2 mg by mouth 3 (three) times daily.      . cromolyn (NASALCROM) 5.2 MG/ACT nasal spray Place 1 spray into the nose 4 (four) times daily as needed.     Marland Kitchen glipiZIDE (GLUCOTROL) 10 MG tablet Take 10 mg by mouth 2 (two) times daily before a meal.      . hydrochlorothiazide (HYDRODIURIL) 25 MG tablet Take 25 mg by mouth daily.      Marland Kitchen HYDROcodone-acetaminophen (LORCET) 10-650 MG per tablet Take 1 tablet by mouth 2 (two) times daily as needed.    . insulin detemir (LEVEMIR) 100 UNIT/ML injection Inject 58 Units into the skin at bedtime.    Marland Kitchen KRILL OIL PO Take 1 tablet by mouth daily.     Marland Kitchen latanoprost (XALATAN) 0.005 % ophthalmic solution Place 1 drop into both eyes at bedtime.      Marland Kitchen losartan (COZAAR) 100 MG tablet Take 100 mg by mouth daily.    . meclizine (ANTIVERT) 25 MG tablet Take 25 mg by mouth 3 (three) times daily as needed.    . meloxicam (MOBIC) 7.5  MG tablet Take 7.5 mg by mouth 2 (two) times daily as needed for pain.    . metFORMIN (GLUMETZA) 500 MG (MOD) 24 hr tablet Take 1,000 mg by mouth 2 (two) times daily.     . metoprolol (TOPROL-XL) 50 MG 24 hr tablet Take 50 mg by mouth daily.     . solifenacin (VESICARE) 5 MG tablet Take 5 mg by mouth daily.    Marland Kitchen umeclidinium-vilanterol (ANORO ELLIPTA) 62.5-25 MCG/INH AEPB Inhale 1 puff into the lungs daily. 1 each 0   No current facility-administered medications for this visit.    Allergies:  Sulfa antibiotics and Sulfonamide derivatives   Social History: The patient  reports that he has quit smoking. He has never used smokeless tobacco. He reports that he does not drink alcohol or use drugs.   Family History: The patient's  family history includes Cancer in his mother; Diabetes in his father, maternal grandfather, mother, paternal grandfather, and sister; Diabetes (age of onset: 60) in his brother; Heart disease in his paternal grandmother; Lung disease in his father; Stroke (age of onset: 47) in his father; Stroke (age of onset: 15) in his paternal grandfather; Stroke (age of onset: 29) in his mother.   ROS:  Please see the history of present illness. Otherwise, complete review of systems is positive for none.  All other systems are reviewed and negative.   Physical Exam: VS:  BP 134/84   Pulse 74   Ht _0  (1.727 m)   Wt (!) 310 lb (140.6 kg)   SpO2 94%   BMI 47.14 kg/m , BMI Body mass index is 47.14 kg/m.  Wt Readings from Last 3 Encounters:  03/10/18 (!) 310 lb (140.6 kg)  01/10/18 (!) 308 lb 12.8 oz (140.1 kg)  10/09/17 (!) 310 lb 4 oz (140.7 kg)    General: Morbidly obese male.  No distress.  Wearing oxygen at 2 L nasal cannula. HEENT: Conjunctiva and lids normal, oropharynx clear. Neck: Supple, no elevated JVP or carotid bruits, no thyromegaly. Lungs: Diminished breath sounds without wheezing, nonlabored breathing at rest. Cardiac: Regular rate and rhythm, no S3, soft apical systolic murmur, no pericardial rub. Abdomen: Protuberant, nontender, bowel sounds present. Extremities: 2+ bilateral leg edema with compression stockings in place, distal pulses 1-2+. Skin: Warm and dry. Musculoskeletal: No kyphosis. Neuropsychiatric: Alert and oriented x3, affect grossly appropriate.  ECG: I personally reviewed the tracing from 10/09/2017 which showed normal sinus rhythm.  Recent Labwork: 10/09/2017: B Natriuretic Peptide 20.5; BUN 14; Creatinine, Ser 0.76; Hemoglobin 14.3; Platelets 265; Potassium 3.6; Sodium 137   Other Studies Reviewed Today:  Echocardiogram 01/24/2018: Study Conclusions  - Left ventricle: The cavity size was normal. Wall thickness was   increased in a pattern of moderate LVH.  Systolic function was   vigorous. The estimated ejection fraction was in the range of 65%   to 70%. Wall motion was normal; there were no regional wall   motion abnormalities. Left ventricular diastolic function   parameters were normal for the patient&'s age. - Aortic valve: Mildly to moderately calcified annulus. Trileaflet.   Mean gradient (S): 5 mm Hg. Valve area (VTI): 3.67 cm^2. - Mitral valve: Mildly calcified annulus. There was trivial   regurgitation. - Right atrium: Central venous pressure (est): 8 mm Hg. - Atrial septum: The septum bowed from right to left, consistent   with increased right atrial pressure. - Tricuspid valve: There was mild regurgitation. - Pulmonary arteries: Systolic pressure was severely increased. PA   peak  pressure: 72 mm Hg (S). - Pericardium, extracardiac: There was no pericardial effusion.  Chest CT 01/18/2018: IMPRESSION: 1. No acute findings or explanation for the patient's symptoms on noncontrast imaging. 2. Mild emphysema with central enlargement of the pulmonary arteries consistent with pulmonary arterial hypertension. 3. Calcified mediastinal and hilar lymph nodes, consistent with prior granulomatous infection. 4. Potentially new perifissural 7 mm mean diameter nodule along the left major fissure. Non-contrast chest CT at 6-12 months is recommended. If the nodule is stable at time of repeat CT, then future CT at 18-24 months (from today's scan) is considered optional for low-risk patients, but is recommended for high-risk patients. This recommendation follows the consensus statement: Guidelines for Management of Incidental Pulmonary Nodules Detected on CT Images: From the Fleischner Society 2017; Radiology 2017; 284:228-243. 5. Mild asymmetric enlargement of the right thyroid lobe, likely asymmetric goiter. 6.  Aortic Atherosclerosis (ICD10-I70.0).  Assessment and Plan:  1.  Severe pulmonary hypertension by recent echocardiogram,  estimated PASP 72 mmHg.  LVEF normal range with reportedly normal diastolic parameters.  He is morbidly obese with known OSA on CPAP, previous PFTs in 2017 demonstrating severe obstruction and restriction.  Despite supplemental oxygen and consistent use of CPAP he has had worsening shortness of breath to NYHA class III over the last 6 months.  I reviewed his recent imaging studies.  Plan is to refer him to see Dr. Haroldine Laws for consideration of right heart catheterization, sort out pulmonary venous versus pulmonary arterial hypertension, and other potential treatment options.  He may simply require a more potent diuretic.  2.  Morbid obesity.  Weight loss would be beneficial in terms of OSA and also restrictive lung disease.  3.  History of normal coronary arteries at remote cardiac catheterization with negative stress testing in 2012.  He does not report any angina symptoms at this time.  4.  Type 2 diabetes mellitus, on insulin with follow-up per Dr. Hilma Favors.  Current medicines were reviewed with the patient today.   Orders Placed This Encounter  Procedures  . AMB referral to CHF clinic    Disposition: Referral to Dr. Haroldine Laws.  Signed, Satira Sark, MD, [facility] 03/10/2018 2:21 PM    [facility] [address], [place], [place] [address] Phone: [phone]; Fax: [phone]

## 2018-03-10 ENCOUNTER — Ambulatory Visit (INDEPENDENT_AMBULATORY_CARE_PROVIDER_SITE_OTHER): Payer: Medicare Other | Admitting: Cardiology

## 2018-03-10 ENCOUNTER — Encounter: Payer: Self-pay | Admitting: Cardiology

## 2018-03-10 VITALS — BP 134/84 | HR 74 | Ht 68.0 in | Wt 310.0 lb

## 2018-03-10 DIAGNOSIS — G4733 Obstructive sleep apnea (adult) (pediatric): Secondary | ICD-10-CM | POA: Diagnosis not present

## 2018-03-10 DIAGNOSIS — I272 Pulmonary hypertension, unspecified: Secondary | ICD-10-CM | POA: Diagnosis not present

## 2018-03-10 DIAGNOSIS — E118 Type 2 diabetes mellitus with unspecified complications: Secondary | ICD-10-CM | POA: Diagnosis not present

## 2018-03-10 DIAGNOSIS — Z794 Long term (current) use of insulin: Secondary | ICD-10-CM | POA: Diagnosis not present

## 2018-03-10 NOTE — Patient Instructions (Addendum)
Your physician wants you to follow-up with  [facility], Dr.Daniel Bensimhon, in [place]   I will place referral for you   Your physician recommends that you continue on your current medications as directed. Please refer to the Current Medication list given to you today.    If you need a refill on your cardiac medications before your next appointment, please call your pharmacy.    No lab work or tests ordered today.       Thank you for choosing [facility] !

## 2018-04-18 DIAGNOSIS — I10 Essential (primary) hypertension: Secondary | ICD-10-CM | POA: Diagnosis not present

## 2018-04-18 DIAGNOSIS — E119 Type 2 diabetes mellitus without complications: Secondary | ICD-10-CM | POA: Diagnosis not present

## 2018-04-18 DIAGNOSIS — E782 Mixed hyperlipidemia: Secondary | ICD-10-CM | POA: Diagnosis not present

## 2018-04-18 DIAGNOSIS — Z1389 Encounter for screening for other disorder: Secondary | ICD-10-CM | POA: Diagnosis not present

## 2018-04-18 DIAGNOSIS — Z6841 Body Mass Index (BMI) 40.0 and over, adult: Secondary | ICD-10-CM | POA: Diagnosis not present

## 2018-04-18 DIAGNOSIS — G4733 Obstructive sleep apnea (adult) (pediatric): Secondary | ICD-10-CM | POA: Diagnosis not present

## 2018-04-18 DIAGNOSIS — R946 Abnormal results of thyroid function studies: Secondary | ICD-10-CM | POA: Diagnosis not present

## 2018-04-18 DIAGNOSIS — Z125 Encounter for screening for malignant neoplasm of prostate: Secondary | ICD-10-CM | POA: Diagnosis not present

## 2018-05-10 ENCOUNTER — Ambulatory Visit: Payer: Medicare Other | Admitting: Internal Medicine

## 2018-05-13 DIAGNOSIS — Z1389 Encounter for screening for other disorder: Secondary | ICD-10-CM | POA: Diagnosis not present

## 2018-05-13 DIAGNOSIS — Z0001 Encounter for general adult medical examination with abnormal findings: Secondary | ICD-10-CM | POA: Diagnosis not present

## 2018-05-13 DIAGNOSIS — G4733 Obstructive sleep apnea (adult) (pediatric): Secondary | ICD-10-CM | POA: Diagnosis not present

## 2018-05-13 DIAGNOSIS — Z6841 Body Mass Index (BMI) 40.0 and over, adult: Secondary | ICD-10-CM | POA: Diagnosis not present

## 2018-05-13 DIAGNOSIS — I10 Essential (primary) hypertension: Secondary | ICD-10-CM | POA: Diagnosis not present

## 2018-05-13 DIAGNOSIS — E119 Type 2 diabetes mellitus without complications: Secondary | ICD-10-CM | POA: Diagnosis not present

## 2018-06-14 ENCOUNTER — Ambulatory Visit (HOSPITAL_COMMUNITY)
Admission: RE | Admit: 2018-06-14 | Discharge: 2018-06-14 | Disposition: A | Discharge status: Home / Self Care | Payer: Medicare Other | Source: Ambulatory Visit | Attending: Cardiology | Admitting: Cardiology

## 2018-06-14 ENCOUNTER — Encounter (HOSPITAL_COMMUNITY): Payer: Self-pay | Admitting: Cardiology

## 2018-06-14 ENCOUNTER — Encounter (HOSPITAL_COMMUNITY): Payer: Self-pay

## 2018-06-14 VITALS — BP 146/78 | HR 69 | Wt 308.8 lb

## 2018-06-14 DIAGNOSIS — R0609 Other forms of dyspnea: Secondary | ICD-10-CM | POA: Diagnosis not present

## 2018-06-14 DIAGNOSIS — I272 Pulmonary hypertension, unspecified: Secondary | ICD-10-CM | POA: Diagnosis not present

## 2018-06-14 DIAGNOSIS — E662 Morbid (severe) obesity with alveolar hypoventilation: Secondary | ICD-10-CM | POA: Diagnosis not present

## 2018-06-14 DIAGNOSIS — Z7982 Long term (current) use of aspirin: Secondary | ICD-10-CM | POA: Insufficient documentation

## 2018-06-14 DIAGNOSIS — R0789 Other chest pain: Secondary | ICD-10-CM | POA: Insufficient documentation

## 2018-06-14 DIAGNOSIS — I209 Angina pectoris, unspecified: Secondary | ICD-10-CM

## 2018-06-14 DIAGNOSIS — Z823 Family history of stroke: Secondary | ICD-10-CM | POA: Insufficient documentation

## 2018-06-14 DIAGNOSIS — I2510 Atherosclerotic heart disease of native coronary artery without angina pectoris: Secondary | ICD-10-CM | POA: Insufficient documentation

## 2018-06-14 DIAGNOSIS — Z79899 Other long term (current) drug therapy: Secondary | ICD-10-CM | POA: Insufficient documentation

## 2018-06-14 DIAGNOSIS — Z833 Family history of diabetes mellitus: Secondary | ICD-10-CM | POA: Diagnosis not present

## 2018-06-14 DIAGNOSIS — Z836 Family history of other diseases of the respiratory system: Secondary | ICD-10-CM | POA: Insufficient documentation

## 2018-06-14 DIAGNOSIS — Z9981 Dependence on supplemental oxygen: Secondary | ICD-10-CM | POA: Insufficient documentation

## 2018-06-14 DIAGNOSIS — Z6841 Body Mass Index (BMI) 40.0 and over, adult: Secondary | ICD-10-CM | POA: Insufficient documentation

## 2018-06-14 DIAGNOSIS — Z794 Long term (current) use of insulin: Secondary | ICD-10-CM | POA: Insufficient documentation

## 2018-06-14 DIAGNOSIS — E119 Type 2 diabetes mellitus without complications: Secondary | ICD-10-CM | POA: Diagnosis not present

## 2018-06-14 DIAGNOSIS — I10 Essential (primary) hypertension: Secondary | ICD-10-CM | POA: Insufficient documentation

## 2018-06-14 DIAGNOSIS — J439 Emphysema, unspecified: Secondary | ICD-10-CM | POA: Insufficient documentation

## 2018-06-14 LAB — BASIC METABOLIC PANEL
Anion gap: 9 (ref 5–15)
BUN: 19 mg/dL (ref 8–23)
CO2: 31 mmol/L (ref 22–32)
Calcium: 9.5 mg/dL (ref 8.9–10.3)
Chloride: 99 mmol/L (ref 98–111)
Creatinine, Ser: 0.96 mg/dL (ref 0.61–1.24)
GFR calc non Af Amer: >60 mL/min (ref >60)
Glucose, Bld: 145 mg/dL — ABNORMAL HIGH (ref 70–99)
POTASSIUM: 4 mmol/L (ref 3.5–5.1)
SODIUM: 139 mmol/L (ref 135–145)

## 2018-06-14 LAB — PROTIME-INR
INR: 1.05
PROTHROMBIN TIME: 13.6 s (ref 11.4–15.2)

## 2018-06-14 LAB — CBC
HCT: 42.8 % (ref 39.0–52.0)
Hemoglobin: 14.2 g/dL (ref 13.0–17.0)
MCH: 30.9 pg (ref 26.0–34.0)
MCHC: 33.2 g/dL (ref 30.0–36.0)
MCV: 93.2 fL (ref 78.0–100.0)
PLATELETS: 264 10*3/uL (ref 150–400)
RBC: 4.59 MIL/uL (ref 4.22–5.81)
RDW: 13.2 % (ref 11.5–15.5)
WBC: 6 10*3/uL (ref 4.0–10.5)

## 2018-06-14 LAB — LIPID PANEL
CHOL/HDL RATIO: 3.9 ratio
Cholesterol: 126 mg/dL (ref 0–200)
HDL: 32 mg/dL — AB (ref >40)
LDL Cholesterol: 79 mg/dL (ref 0–99)
Triglycerides: 77 mg/dL (ref <150)
VLDL: 15 mg/dL (ref 0–40)

## 2018-06-14 MED ORDER — FUROSEMIDE 20 MG PO TABS: 20.0000 mg | ORAL_TABLET | Freq: Every day | ORAL | 3 refills | Status: DC

## 2018-06-14 MED ORDER — AMLODIPINE BESYLATE 10 MG PO TABS: 10.0000 mg | ORAL_TABLET | Freq: Every day | ORAL | 1 refills | Status: DC

## 2018-06-14 MED ORDER — ASPIRIN 81 MG PO TABS: 162.0000 mg | ORAL_TABLET | Freq: Every day | ORAL | 11 refills | Status: DC

## 2018-06-14 NOTE — Progress Notes (Signed)
PCP: Dr. Hilma Favors Cardiology: Dr. Domenic Polite HF Cardiology: Dr. Aundra Dubin  67 yo with history of OHS/OSA, chronic bronchitis, and suspected pulmonary hypertension was referred by Dr. Domenic Polite for evaluation of pulmonary hypertension. Patient is on 2L home oxygen and CPAP. He has had gradually worsening exertional dyspnea over the last year.  He was able to mow his grass last fall but cannot now.  He is short of breath after walking about 50-60 feet with his oxygen on.  He is short of breath with any incline.  He will get chest tightness if he walks more than 50 feet or so.  This will resolve with rest.  No orthopnea or PND.  No lightheadedness/syncope.  BP high in the office today but he checks at home, says SBP generally 120s.  Echo in 3/19 showed normal EF with PASP 72 mmHg.   He is followed by pulmonary.  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   Labs (11/18): BNP 21  ECG (11/18, personally reviewed): NSR, normal  PMH: 1. OHS/OSA: Uses 2L home oxygen and CPAP.  2. Obesity 3. HTN 4. Type II diabetes 5. Chronic bronchitis: ?from welding.  Never smoked.    - CT chest (3/19): coronary calcification, mild emphysema, calcified mediastinal and hilar nodes consistent with prior granulomatous infection.  - PFTs (12/17): FVC 56%, FEV1 57%, ratio 101%, SVC 109%, DLCO 66%.  Moderate obstruction/severe restriction.  6. CAD: Coronary calcification on CT chest 3/19.   - Cardiolite in 2012 showed no ischemia.  7. Pulmonary hypertension: Echo (3/19) with EF 65-70%, moderate LVH, PASP 72 mmHg.   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   Family History  Problem Relation Age of Onset  . Lung disease Father   . Stroke Father 38  . Diabetes Father   . Diabetes Maternal Grandfather   . Heart disease Paternal Grandmother   . Stroke Paternal Grandfather 48  . Diabetes Paternal  Grandfather   . Diabetes Mother   . Cancer Mother   . Stroke Mother 54  . Diabetes Sister   . Diabetes Brother 36   ROS: All systems reviewed and negative except as per HPI.   Current Outpatient Medications  Medication Sig Dispense Refill  . amLODipine (NORVASC) 10 MG tablet Take 1 tablet (10 mg total) by mouth daily. 90 tablet 1  . aspirin 81 MG tablet Take 2 tablets (162 mg total) by mouth daily. 60 tablet 11  . cloNIDine (CATAPRES) 0.2 MG tablet Take 0.2 mg by mouth 3 (three) times daily.      . cromolyn (NASALCROM) 5.2 MG/ACT nasal spray Place 1 spray into the nose 4 (four) times daily as needed.     Marland Kitchen glipiZIDE (GLUCOTROL) 10 MG tablet Take 10 mg by mouth 2 (two) times daily before a meal.      . HYDROcodone-acetaminophen (LORCET) 10-650 MG per tablet Take 1 tablet by mouth 2 (two) times daily as needed.    . insulin detemir (LEVEMIR) 100 UNIT/ML injection Inject 44 Units into the skin at bedtime.    Marland Kitchen KRILL OIL PO Take 1 tablet by mouth daily.     Marland Kitchen latanoprost (XALATAN) 0.005 % ophthalmic solution Place 1 drop into both eyes at bedtime.      Marland Kitchen losartan (COZAAR) 100 MG tablet Take 100 mg by mouth daily.    . meclizine (ANTIVERT) 25 MG tablet Take 25  mg by mouth 3 (three) times daily as needed.    . meloxicam (MOBIC) 7.5 MG tablet Take 7.5 mg by mouth 2 (two) times daily as needed for pain.    . metFORMIN (GLUMETZA) 500 MG (MOD) 24 hr tablet Take 1,000 mg by mouth 2 (two) times daily.     . metoprolol (TOPROL-XL) 50 MG 24 hr tablet Take 50 mg by mouth daily.     . solifenacin (VESICARE) 10 MG tablet Take 10 mg by mouth daily.    Marland Kitchen umeclidinium-vilanterol (ANORO ELLIPTA) 62.5-25 MCG/INH AEPB Inhale 1 puff into the lungs daily. 1 each 0  . furosemide (LASIX) 20 MG tablet Take 1 tablet (20 mg total) by mouth daily. 90 tablet 3   No current facility-administered medications for this encounter.    BP (!) 146/78   Pulse 69   Wt (!) 308 lb 12.8 oz (140.1 kg)   SpO2 93% Comment: 2L   BMI 46.95 kg/m  General: NAD Neck: JVP 8 cm with HJR, no thyromegaly or thyroid nodule.  Lungs: Clear to auscultation bilaterally with normal respiratory effort. CV: Nondisplaced PMI.  Heart regular S1/S2, no S3/S4, no murmur.  1+ edema 1/2 to knees bilaterally.  No carotid bruit.  Normal pedal pulses.  Abdomen: Soft, nontender, no hepatosplenomegaly, no distention.  Skin: Intact without lesions or rashes.  Neurologic: Alert and oriented x 3.  Psych: Normal affect. Extremities: No clubbing or cyanosis.  HEENT: Normal.   Assessment/Plan: 1. Pulmonary hypertension: Patient has NYHA class IIIb dyspnea.  Echo in 3/19 showed estimated PA systolic pressure 72.  He has chronic bronchitis as well as OHS/OSA.  Interestingly, chest CT showed calcified mediastinal and hilar nodes suggestive of prior granulomatous infection => could this actually represent sarcoidosis? Suspect there is a group 3 PH component from OHS/OSA and parenchymal lung disease, but ?group 1 PH component.  Mild volume overload on exam.  - Will plan RHC to assess pulmonary hypertension and filling pressures.  We discussed risks/benefits and he agrees to proceed.  - With mild volume overload, stop HCTZ and start Lasix 20 mg daily.  BMET today and in 10 days.  2. CAD: Coronary calcification on 3/19 chest CT.  Patient has exertional chest tightness resolving with rest.   - I will do coronary angiography along with RHC.  We discussed risks/benefits and he agrees to proceed.  - Check lipids.  - Can decrease ASA to 81 mg daily.  3. HTN: Stopping HCTZ, so will increase amlodipine to 10 mg daily to keep BP controlled.  4. OHS/OSA: Continue oxygen 2L during day and CPAP at night.   Loralie Champagne 06/14/2018

## 2018-06-14 NOTE — Patient Instructions (Signed)
STOP Hydrochlorothiazide.  DECREASE Aspirin to 162 mg (Take 2 81 mg tablets once daily).  START Lasix 20 mg tablet once daily at bedtime.  INCREASE Amlodipine (Norvasc) to 10 mg once daily. Can double up on 5 mg tablets you have at home (Take 2 tabs once daily). New Rx has been sent to your pharmacy for 10 mg tablets (Take 1 tab once daily).  Routine lab work today. Will notify you of abnormal results, otherwise no news is good news!  You have been scheduled for a heart catheterization. Please see instruction sheet for additional details.  Follow up 3-4 weeks with Dr. Aundra Dubin.  ___________________________________________________________________ Marin Roberts Code: 1500  Take all medication as prescribed the day of your appointment. Bring all medications with you to your appointment.  Do the following things EVERYDAY: 1) Weigh yourself in the morning before breakfast. Write it down and keep it in a log. 2) Take your medicines as prescribed 3) Eat low salt foods-Limit salt (sodium) to 2000 mg per day.  4) Stay as active as you can everyday 5) Limit all fluids for the day to less than 2 liters

## 2018-06-14 NOTE — H&P (View-Only) (Signed)
PCP: Dr. Hilma Favors Cardiology: Dr. Domenic Polite HF Cardiology: Dr. Aundra Dubin  67 yo with history of OHS/OSA, chronic bronchitis, and suspected pulmonary hypertension was referred by Dr. Domenic Polite for evaluation of pulmonary hypertension. Patient is on 2L home oxygen and CPAP. He has had gradually worsening exertional dyspnea over the last year.  He was able to mow his grass last fall but cannot now.  He is short of breath after walking about 50-60 feet with his oxygen on.  He is short of breath with any incline.  He will get chest tightness if he walks more than 50 feet or so.  This will resolve with rest.  No orthopnea or PND.  No lightheadedness/syncope.  BP high in the office today but he checks at home, says SBP generally 120s.  Echo in 3/19 showed normal EF with PASP 72 mmHg.   He is followed by pulmonary.  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   Labs (11/18): BNP 21  ECG (11/18, personally reviewed): NSR, normal  PMH: 1. OHS/OSA: Uses 2L home oxygen and CPAP.  2. Obesity 3. HTN 4. Type II diabetes 5. Chronic bronchitis: ?from welding.  Never smoked.    - CT chest (3/19): coronary calcification, mild emphysema, calcified mediastinal and hilar nodes consistent with prior granulomatous infection.  - PFTs (12/17): FVC 56%, FEV1 57%, ratio 101%, SVC 109%, DLCO 66%.  Moderate obstruction/severe restriction.  6. CAD: Coronary calcification on CT chest 3/19.   - Cardiolite in 2012 showed no ischemia.  7. Pulmonary hypertension: Echo (3/19) with EF 65-70%, moderate LVH, PASP 72 mmHg.   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   Family History  Problem Relation Age of Onset  . Lung disease Father   . Stroke Father 38  . Diabetes Father   . Diabetes Maternal Grandfather   . Heart disease Paternal Grandmother   . Stroke Paternal Grandfather 48  . Diabetes Paternal  Grandfather   . Diabetes Mother   . Cancer Mother   . Stroke Mother 54  . Diabetes Sister   . Diabetes Brother 36   ROS: All systems reviewed and negative except as per HPI.   Current Outpatient Medications  Medication Sig Dispense Refill  . amLODipine (NORVASC) 10 MG tablet Take 1 tablet (10 mg total) by mouth daily. 90 tablet 1  . aspirin 81 MG tablet Take 2 tablets (162 mg total) by mouth daily. 60 tablet 11  . cloNIDine (CATAPRES) 0.2 MG tablet Take 0.2 mg by mouth 3 (three) times daily.      . cromolyn (NASALCROM) 5.2 MG/ACT nasal spray Place 1 spray into the nose 4 (four) times daily as needed.     Marland Kitchen glipiZIDE (GLUCOTROL) 10 MG tablet Take 10 mg by mouth 2 (two) times daily before a meal.      . HYDROcodone-acetaminophen (LORCET) 10-650 MG per tablet Take 1 tablet by mouth 2 (two) times daily as needed.    . insulin detemir (LEVEMIR) 100 UNIT/ML injection Inject 44 Units into the skin at bedtime.    Marland Kitchen KRILL OIL PO Take 1 tablet by mouth daily.     Marland Kitchen latanoprost (XALATAN) 0.005 % ophthalmic solution Place 1 drop into both eyes at bedtime.      Marland Kitchen losartan (COZAAR) 100 MG tablet Take 100 mg by mouth daily.    . meclizine (ANTIVERT) 25 MG tablet Take 25  mg by mouth 3 (three) times daily as needed.    . meloxicam (MOBIC) 7.5 MG tablet Take 7.5 mg by mouth 2 (two) times daily as needed for pain.    . metFORMIN (GLUMETZA) 500 MG (MOD) 24 hr tablet Take 1,000 mg by mouth 2 (two) times daily.     . metoprolol (TOPROL-XL) 50 MG 24 hr tablet Take 50 mg by mouth daily.     . solifenacin (VESICARE) 10 MG tablet Take 10 mg by mouth daily.    Marland Kitchen umeclidinium-vilanterol (ANORO ELLIPTA) 62.5-25 MCG/INH AEPB Inhale 1 puff into the lungs daily. 1 each 0  . furosemide (LASIX) 20 MG tablet Take 1 tablet (20 mg total) by mouth daily. 90 tablet 3   No current facility-administered medications for this encounter.    BP (!) 146/78   Pulse 69   Wt (!) 308 lb 12.8 oz (140.1 kg)   SpO2 93% Comment: 2L   BMI 46.95 kg/m  General: NAD Neck: JVP 8 cm with HJR, no thyromegaly or thyroid nodule.  Lungs: Clear to auscultation bilaterally with normal respiratory effort. CV: Nondisplaced PMI.  Heart regular S1/S2, no S3/S4, no murmur.  1+ edema 1/2 to knees bilaterally.  No carotid bruit.  Normal pedal pulses.  Abdomen: Soft, nontender, no hepatosplenomegaly, no distention.  Skin: Intact without lesions or rashes.  Neurologic: Alert and oriented x 3.  Psych: Normal affect. Extremities: No clubbing or cyanosis.  HEENT: Normal.   Assessment/Plan: 1. Pulmonary hypertension: Patient has NYHA class IIIb dyspnea.  Echo in 3/19 showed estimated PA systolic pressure 72.  He has chronic bronchitis as well as OHS/OSA.  Interestingly, chest CT showed calcified mediastinal and hilar nodes suggestive of prior granulomatous infection => could this actually represent sarcoidosis? Suspect there is a group 3 PH component from OHS/OSA and parenchymal lung disease, but ?group 1 PH component.  Mild volume overload on exam.  - Will plan RHC to assess pulmonary hypertension and filling pressures.  We discussed risks/benefits and he agrees to proceed.  - With mild volume overload, stop HCTZ and start Lasix 20 mg daily.  BMET today and in 10 days.  2. CAD: Coronary calcification on 3/19 chest CT.  Patient has exertional chest tightness resolving with rest.   - I will do coronary angiography along with RHC.  We discussed risks/benefits and he agrees to proceed.  - Check lipids.  - Can decrease ASA to 81 mg daily.  3. HTN: Stopping HCTZ, so will increase amlodipine to 10 mg daily to keep BP controlled.  4. OHS/OSA: Continue oxygen 2L during day and CPAP at night.   Loralie Champagne 06/14/2018

## 2018-06-15 ENCOUNTER — Other Ambulatory Visit (HOSPITAL_COMMUNITY): Payer: Self-pay | Admitting: *Deleted

## 2018-06-15 ENCOUNTER — Other Ambulatory Visit (HOSPITAL_COMMUNITY): Payer: Self-pay

## 2018-06-15 DIAGNOSIS — I272 Pulmonary hypertension, unspecified: Secondary | ICD-10-CM

## 2018-06-15 MED ORDER — AMLODIPINE BESYLATE 10 MG PO TABS: 10.0000 mg | ORAL_TABLET | Freq: Every day | ORAL | 1 refills | Status: DC

## 2018-06-15 MED ORDER — FUROSEMIDE 20 MG PO TABS: 20.0000 mg | ORAL_TABLET | Freq: Every day | ORAL | 3 refills | Status: DC

## 2018-06-22 ENCOUNTER — Telehealth: Payer: Self-pay | Admitting: Internal Medicine

## 2018-06-22 NOTE — Telephone Encounter (Signed)
Ok- thanks for notification

## 2018-06-22 NOTE — Telephone Encounter (Signed)
Called and spoke with Patient.  Patient is wanting to make sure that [name] is aware that he is having heart cath and coronary angiograph 06/23/18.  Will route to [name]

## 2018-06-23 ENCOUNTER — Encounter (HOSPITAL_COMMUNITY)
Admission: RE | Disposition: A | Discharge status: Home / Self Care | Payer: Self-pay | Source: Ambulatory Visit | Attending: Cardiology

## 2018-06-23 ENCOUNTER — Encounter (HOSPITAL_COMMUNITY): Payer: Self-pay | Admitting: Cardiology

## 2018-06-23 ENCOUNTER — Other Ambulatory Visit: Payer: Self-pay

## 2018-06-23 ENCOUNTER — Ambulatory Visit (HOSPITAL_COMMUNITY)
Admission: RE | Admit: 2018-06-23 | Discharge: 2018-06-23 | Disposition: A | Discharge status: Home / Self Care | Payer: Medicare Other | Source: Ambulatory Visit | Attending: Cardiology | Admitting: Cardiology

## 2018-06-23 DIAGNOSIS — I10 Essential (primary) hypertension: Secondary | ICD-10-CM | POA: Insufficient documentation

## 2018-06-23 DIAGNOSIS — Z7982 Long term (current) use of aspirin: Secondary | ICD-10-CM | POA: Diagnosis not present

## 2018-06-23 DIAGNOSIS — Z9981 Dependence on supplemental oxygen: Secondary | ICD-10-CM | POA: Diagnosis not present

## 2018-06-23 DIAGNOSIS — I2723 Pulmonary hypertension due to lung diseases and hypoxia: Secondary | ICD-10-CM | POA: Diagnosis not present

## 2018-06-23 DIAGNOSIS — R0789 Other chest pain: Secondary | ICD-10-CM | POA: Insufficient documentation

## 2018-06-23 DIAGNOSIS — G4733 Obstructive sleep apnea (adult) (pediatric): Secondary | ICD-10-CM | POA: Insufficient documentation

## 2018-06-23 DIAGNOSIS — E119 Type 2 diabetes mellitus without complications: Secondary | ICD-10-CM | POA: Diagnosis not present

## 2018-06-23 DIAGNOSIS — Z794 Long term (current) use of insulin: Secondary | ICD-10-CM | POA: Insufficient documentation

## 2018-06-23 DIAGNOSIS — J42 Unspecified chronic bronchitis: Secondary | ICD-10-CM | POA: Insufficient documentation

## 2018-06-23 DIAGNOSIS — I272 Pulmonary hypertension, unspecified: Secondary | ICD-10-CM

## 2018-06-23 DIAGNOSIS — Z6841 Body Mass Index (BMI) 40.0 and over, adult: Secondary | ICD-10-CM | POA: Diagnosis not present

## 2018-06-23 DIAGNOSIS — E669 Obesity, unspecified: Secondary | ICD-10-CM | POA: Diagnosis not present

## 2018-06-23 DIAGNOSIS — I2721 Secondary pulmonary arterial hypertension: Secondary | ICD-10-CM | POA: Diagnosis not present

## 2018-06-23 HISTORY — PX: RIGHT/LEFT HEART CATH AND CORONARY ANGIOGRAPHY: CATH118266

## 2018-06-23 LAB — GLUCOSE, CAPILLARY: GLUCOSE-CAPILLARY: 169 mg/dL — AB (ref 70–99)

## 2018-06-23 SURGERY — RIGHT/LEFT HEART CATH AND CORONARY ANGIOGRAPHY
Anesthesia: LOCAL

## 2018-06-23 MED ORDER — SODIUM CHLORIDE 0.9 % IV SOLN
INTRAVENOUS | Status: DC
  Administered 2018-06-23: 07:00:00 via INTRAVENOUS

## 2018-06-23 MED ORDER — ASPIRIN 81 MG PO CHEW
81.0000 mg | CHEWABLE_TABLET | Freq: Once | ORAL | Status: AC
  Administered 2018-06-23: 81 mg via ORAL

## 2018-06-23 MED ORDER — HEPARIN SODIUM (PORCINE) 1000 UNIT/ML IJ SOLN
INTRAMUSCULAR | Status: AC
  Filled 2018-06-23: qty 1

## 2018-06-23 MED ORDER — SODIUM CHLORIDE 0.9 % IV SOLN: 250.0000 mL | INTRAVENOUS | Status: DC | PRN

## 2018-06-23 MED ORDER — MIDAZOLAM HCL 2 MG/2ML IJ SOLN
INTRAMUSCULAR | Status: AC
  Filled 2018-06-23: qty 2

## 2018-06-23 MED ORDER — FENTANYL CITRATE (PF) 100 MCG/2ML IJ SOLN
INTRAMUSCULAR | Status: AC
  Filled 2018-06-23: qty 2

## 2018-06-23 MED ORDER — HEPARIN (PORCINE) IN NACL 1000-0.9 UT/500ML-% IV SOLN
INTRAVENOUS | Status: DC | PRN
  Administered 2018-06-23 (×2): 500 mL

## 2018-06-23 MED ORDER — SODIUM CHLORIDE 0.9% FLUSH: 3.0000 mL | Freq: Two times a day (BID) | INTRAVENOUS | Status: DC

## 2018-06-23 MED ORDER — MIDAZOLAM HCL 2 MG/2ML IJ SOLN
INTRAMUSCULAR | Status: DC | PRN
  Administered 2018-06-23: 1 mg via INTRAVENOUS

## 2018-06-23 MED ORDER — ASPIRIN 81 MG PO CHEW
CHEWABLE_TABLET | ORAL | Status: AC
  Administered 2018-06-23: 81 mg via ORAL
  Filled 2018-06-23: qty 1

## 2018-06-23 MED ORDER — VERAPAMIL HCL 2.5 MG/ML IV SOLN
INTRAVENOUS | Status: AC
  Filled 2018-06-23: qty 2

## 2018-06-23 MED ORDER — SODIUM CHLORIDE 0.9% FLUSH: 3.0000 mL | INTRAVENOUS | Status: DC | PRN

## 2018-06-23 MED ORDER — ACETAMINOPHEN 325 MG PO TABS: 650.0000 mg | ORAL_TABLET | ORAL | Status: DC | PRN

## 2018-06-23 MED ORDER — HEPARIN SODIUM (PORCINE) 1000 UNIT/ML IJ SOLN
INTRAMUSCULAR | Status: DC | PRN
  Administered 2018-06-23: 6500 [IU] via INTRAVENOUS

## 2018-06-23 MED ORDER — ONDANSETRON HCL 4 MG/2ML IJ SOLN: 4.0000 mg | Freq: Four times a day (QID) | INTRAMUSCULAR | Status: DC | PRN

## 2018-06-23 MED ORDER — SODIUM CHLORIDE 0.9 % IV SOLN: INTRAVENOUS | Status: DC

## 2018-06-23 MED ORDER — VERAPAMIL HCL 2.5 MG/ML IV SOLN
INTRAVENOUS | Status: DC | PRN
  Administered 2018-06-23: 10 mL via INTRA_ARTERIAL

## 2018-06-23 MED ORDER — IOHEXOL 350 MG/ML SOLN
INTRAVENOUS | Status: DC | PRN
  Administered 2018-06-23: 50 mL via INTRA_ARTERIAL

## 2018-06-23 MED ORDER — HEPARIN (PORCINE) IN NACL 1000-0.9 UT/500ML-% IV SOLN
INTRAVENOUS | Status: AC
  Filled 2018-06-23: qty 500

## 2018-06-23 MED ORDER — FENTANYL CITRATE (PF) 100 MCG/2ML IJ SOLN
INTRAMUSCULAR | Status: DC | PRN
  Administered 2018-06-23: 25 ug via INTRAVENOUS

## 2018-06-23 MED ORDER — LIDOCAINE HCL (PF) 1 % IJ SOLN
INTRAMUSCULAR | Status: DC | PRN
  Administered 2018-06-23: 5 mL

## 2018-06-23 SURGICAL SUPPLY — 11 items
CATH 5FR JL3.5 JR4 ANG PIG MP (CATHETERS) ×2 IMPLANT
CATH BALLN WEDGE 5F 110CM (CATHETERS) ×1 IMPLANT
DEVICE RAD COMP TR BAND LRG (VASCULAR PRODUCTS) ×1 IMPLANT
GLIDESHEATH SLEND SS 6F .021 (SHEATH) ×1 IMPLANT
GUIDEWIRE INQWIRE 1.5J.035X260 (WIRE) IMPLANT
INQWIRE 1.5J .035X260CM (WIRE) ×2
KIT HEART LEFT (KITS) ×2 IMPLANT
PACK CARDIAC CATHETERIZATION (CUSTOM PROCEDURE TRAY) ×2 IMPLANT
SHEATH GLIDE SLENDER 4/5FR (SHEATH) ×1 IMPLANT
TRANSDUCER W/STOPCOCK (MISCELLANEOUS) ×2 IMPLANT
TUBING CIL FLEX 10 FLL-RA (TUBING) ×2 IMPLANT

## 2018-06-23 NOTE — Discharge Instructions (Signed)
**Note Avi Kerschner-Identified via Obfuscation** Radial Site Care Refer to this sheet in the next few weeks. These instructions provide you with information about caring for yourself after your procedure. Your health care provider may also give you more specific instructions. Your treatment has been planned according to current medical practices, but problems sometimes occur. Call your health care provider if you have any problems or questions after your procedure. What can I expect after the procedure? After your procedure, it is typical to have the following:  Bruising at the radial site that usually fades within 1-2 weeks.  Blood collecting in the tissue (hematoma) that may be painful to the touch. It should usually decrease in size and tenderness within 1-2 weeks.  Follow these instructions at home:  Take medicines only as directed by your health care provider.  You may shower 24-48 hours after the procedure or as directed by your health care provider. Remove the bandage (dressing) and gently wash the site with plain soap and water. Pat the area dry with a clean towel. Do not rub the site, because this may cause bleeding.  Do not take baths, swim, or use a hot tub until your health care provider approves.  Check your insertion site every day for redness, swelling, or drainage.  Do not apply powder or lotion to the site.  Do not flex or bend the affected arm for 24 hours or as directed by your health care provider.  Do not push or pull heavy objects with the affected arm for 24 hours or as directed by your health care provider.  Do not lift over 10 lb (4.5 kg) for 5 days after your procedure or as directed by your health care provider.  Ask your health care provider when it is okay to: ? Return to work or school. ? Resume usual physical activities or sports. ? Resume sexual activity.  Do not drive home if you are discharged the same day as the procedure. Have someone else drive you.  You may drive 24 hours after the procedure  unless otherwise instructed by your health care provider.  Do not operate machinery or power tools for 24 hours after the procedure.  If your procedure was done as an outpatient procedure, which means that you went home the same day as your procedure, a responsible adult should be with you for the first 24 hours after you arrive home.  Keep all follow-up visits as directed by your health care provider. This is important. Contact a health care provider if:  You have a fever.  You have chills.  You have increased bleeding from the radial site. Hold pressure on the site. Get help right away if:  You have unusual pain at the radial site.  You have redness, warmth, or swelling at the radial site.  You have drainage (other than a small amount of blood on the dressing) from the radial site.  The radial site is bleeding, and the bleeding does not stop after 30 minutes of holding steady pressure on the site.  Your arm or hand becomes pale, cool, tingly, or numb. This information is not intended to replace advice given to you by your health care provider. Make sure you discuss any questions you have with your health care provider. Document Released: 12/05/2010 Document Revised: 04/09/2016 Document Reviewed: 05/21/2014 Elsevier Interactive Patient Education  2018 [employer].

## 2018-06-23 NOTE — Interval H&P Note (Signed)
History and Physical Interval Note:  06/23/2018 7:54 AM  Colton Jackson.  has presented today for surgery, with the diagnosis of Pulmonary HTN  The various methods of treatment have been discussed with the patient and family. After consideration of risks, benefits and other options for treatment, the patient has consented to  Procedure(s): RIGHT/LEFT HEART CATH AND CORONARY ANGIOGRAPHY (N/A) as a surgical intervention .  The patient's history has been reviewed, patient examined, no change in status, stable for surgery.  I have reviewed the patient's chart and labs.  Questions were answered to the patient's satisfaction.     Dalton [employer]

## 2018-06-24 LAB — POCT I-STAT 3, VENOUS BLOOD GAS (G3P V)
ACID-BASE EXCESS: 1 mmol/L (ref 0.0–2.0)
Acid-Base Excess: 2 mmol/L (ref 0.0–2.0)
BICARBONATE: 27.3 mmol/L (ref 20.0–28.0)
Bicarbonate: 28.5 mmol/L — ABNORMAL HIGH (ref 20.0–28.0)
O2 SAT: 74 %
O2 Saturation: 74 %
PCO2 VEN: 49.2 mmHg (ref 44.0–60.0)
PCO2 VEN: 50.9 mmHg (ref 44.0–60.0)
PH VEN: 7.352 (ref 7.250–7.430)
PH VEN: 7.356 (ref 7.250–7.430)
PO2 VEN: 41 mmHg (ref 32.0–45.0)
TCO2: 30 mmol/L (ref 22–32)
TCO2: 29 mmol/L (ref 22–32)
pO2, Ven: 42 mmHg (ref 32.0–45.0)

## 2018-06-30 ENCOUNTER — Telehealth (HOSPITAL_COMMUNITY): Payer: Self-pay | Admitting: *Deleted

## 2018-06-30 DIAGNOSIS — I270 Primary pulmonary hypertension: Secondary | ICD-10-CM

## 2018-06-30 NOTE — Telephone Encounter (Signed)
Per Dr Aundra Dubin, pt's [facility] showed pulm htn and he would like pt to get a VQ scan to r/u chronic PE.  Attempted to call pt and Left message to call back

## 2018-07-07 DIAGNOSIS — H401131 Primary open-angle glaucoma, bilateral, mild stage: Secondary | ICD-10-CM | POA: Diagnosis not present

## 2018-07-07 DIAGNOSIS — H35352 Cystoid macular degeneration, left eye: Secondary | ICD-10-CM | POA: Diagnosis not present

## 2018-07-07 DIAGNOSIS — E113391 Type 2 diabetes mellitus with moderate nonproliferative diabetic retinopathy without macular edema, right eye: Secondary | ICD-10-CM | POA: Diagnosis not present

## 2018-07-07 DIAGNOSIS — E113392 Type 2 diabetes mellitus with moderate nonproliferative diabetic retinopathy without macular edema, left eye: Secondary | ICD-10-CM | POA: Diagnosis not present

## 2018-07-07 DIAGNOSIS — E119 Type 2 diabetes mellitus without complications: Secondary | ICD-10-CM | POA: Diagnosis not present

## 2018-07-07 DIAGNOSIS — H26492 Other secondary cataract, left eye: Secondary | ICD-10-CM | POA: Diagnosis not present

## 2018-07-14 DIAGNOSIS — H26492 Other secondary cataract, left eye: Secondary | ICD-10-CM | POA: Diagnosis not present

## 2018-07-15 ENCOUNTER — Emergency Department (HOSPITAL_COMMUNITY)
Admission: EM | Admit: 2018-07-15 | Discharge: 2018-07-15 | Disposition: A | Discharge status: Home / Self Care | Payer: Medicare Other | Attending: Emergency Medicine | Admitting: Emergency Medicine

## 2018-07-15 ENCOUNTER — Emergency Department (HOSPITAL_COMMUNITY): Payer: Medicare Other

## 2018-07-15 ENCOUNTER — Encounter (HOSPITAL_COMMUNITY): Payer: Self-pay | Admitting: *Deleted

## 2018-07-15 ENCOUNTER — Other Ambulatory Visit: Payer: Self-pay

## 2018-07-15 ENCOUNTER — Ambulatory Visit: Payer: Medicare Other | Admitting: Internal Medicine

## 2018-07-15 DIAGNOSIS — R079 Chest pain, unspecified: Secondary | ICD-10-CM | POA: Insufficient documentation

## 2018-07-15 DIAGNOSIS — E119 Type 2 diabetes mellitus without complications: Secondary | ICD-10-CM | POA: Insufficient documentation

## 2018-07-15 DIAGNOSIS — R0789 Other chest pain: Secondary | ICD-10-CM | POA: Diagnosis not present

## 2018-07-15 DIAGNOSIS — R0902 Hypoxemia: Secondary | ICD-10-CM | POA: Diagnosis not present

## 2018-07-15 DIAGNOSIS — Z7982 Long term (current) use of aspirin: Secondary | ICD-10-CM | POA: Diagnosis not present

## 2018-07-15 DIAGNOSIS — Z794 Long term (current) use of insulin: Secondary | ICD-10-CM | POA: Insufficient documentation

## 2018-07-15 DIAGNOSIS — I10 Essential (primary) hypertension: Secondary | ICD-10-CM | POA: Insufficient documentation

## 2018-07-15 DIAGNOSIS — Z87891 Personal history of nicotine dependence: Secondary | ICD-10-CM | POA: Insufficient documentation

## 2018-07-15 DIAGNOSIS — Z79899 Other long term (current) drug therapy: Secondary | ICD-10-CM | POA: Diagnosis not present

## 2018-07-15 LAB — BASIC METABOLIC PANEL
Anion gap: 8 (ref 5–15)
BUN: 18 mg/dL (ref 8–23)
CHLORIDE: 97 mmol/L — AB (ref 98–111)
CO2: 32 mmol/L (ref 22–32)
Calcium: 9.4 mg/dL (ref 8.9–10.3)
Creatinine, Ser: 0.73 mg/dL (ref 0.61–1.24)
GFR calc Af Amer: >60 mL/min (ref >60)
GFR calc non Af Amer: >60 mL/min (ref >60)
GLUCOSE: 166 mg/dL — AB (ref 70–99)
POTASSIUM: 3.9 mmol/L (ref 3.5–5.1)
Sodium: 137 mmol/L (ref 135–145)

## 2018-07-15 LAB — CBC
HEMATOCRIT: 41.7 % (ref 39.0–52.0)
Hemoglobin: 14.4 g/dL (ref 13.0–17.0)
MCH: 32.1 pg (ref 26.0–34.0)
MCHC: 34.5 g/dL (ref 30.0–36.0)
MCV: 93.1 fL (ref 78.0–100.0)
Platelets: 262 10*3/uL (ref 150–400)
RBC: 4.48 MIL/uL (ref 4.22–5.81)
RDW: 13 % (ref 11.5–15.5)
WBC: 10.5 10*3/uL (ref 4.0–10.5)

## 2018-07-15 LAB — I-STAT TROPONIN, ED
Troponin i, poc: 0.01 ng/mL (ref 0.00–0.08)
Troponin i, poc: 0 ng/mL (ref 0.00–0.08)

## 2018-07-15 MED ORDER — MORPHINE SULFATE (PF) 4 MG/ML IV SOLN
4.0000 mg | Freq: Once | INTRAVENOUS | Status: AC
  Administered 2018-07-15: 4 mg via INTRAVENOUS
  Filled 2018-07-15: qty 1

## 2018-07-15 MED ORDER — ONDANSETRON HCL 4 MG/2ML IJ SOLN
4.0000 mg | Freq: Once | INTRAMUSCULAR | Status: AC
  Administered 2018-07-15: 4 mg via INTRAVENOUS
  Filled 2018-07-15: qty 2

## 2018-07-15 NOTE — ED Provider Notes (Addendum)
[facility] EMERGENCY DEPARTMENT Provider Note   CSN: [id number] Arrival date & time: 07/15/18  0256     History   Chief Complaint Chief Complaint  Patient presents with  . Chest Pain    HPI Colton Jackson. is a 67 y.o. male.  Patient presents to the emergency department for evaluation of chest pain.  Patient has been experiencing intermittent episodes of chest pain for 4 or 5 days.  He reports that he generally goes away if he lies down and rests.  He has not, however, noticed if it is caused by exertion.  He does not have radiation of the pain, pain is generally in the center of his chest.  No shortness of breath associated, no nausea or diaphoresis.  He denies previous cardiac history.  Patient is brought to the ER by ambulance.  He reports that he had pain earlier tonight, was able to fall asleep.  He woke up and the pain was resolved, but then he woke up again in the middle of the night with pain once again.  He was given aspirin and nitroglycerin by EMS.  Nitroglycerin improved the pain but it has not completely gone away.     Past Medical History:  Diagnosis Date  . Allergic rhinitis   . Degenerative disk disease   . Essential hypertension   . H/O cardiovascular stress test 02/02/11   EF 59% - Normal stress test  . Knee pain, bilateral   . Lower back pain   . Morbid obesity ([place])   . OSA (obstructive sleep apnea)    On CPAP  . Type 2 diabetes mellitus [facility])     Patient Active Problem List   Diagnosis Date Noted  . AB (asthmatic bronchitis), moderate persistent, uncomplicated 50/53/9767  . Dyspnea on exertion 09/09/2016  . Acute bronchitis 10/01/2015  . Obesity hypoventilation syndrome ([place]) 12/21/2011  . Degenerative disc disease 12/21/2011  . DIABETES MELLITUS 06/26/2008  . MORBID OBESITY 06/25/2008  . Obstructive sleep apnea 06/25/2008  . HYPERTENSION 06/25/2008  . ALLERGIC RHINITIS 06/25/2008  . BACK PAIN, CHRONIC 06/25/2008    Past Surgical History:    Procedure Laterality Date  . APPENDECTOMY    . Cataract surgery Bilateral   . KNEE ARTHROSCOPY Bilateral   . NASAL SEPTOPLASTY W/ TURBINOPLASTY    . RIGHT/LEFT HEART CATH AND CORONARY ANGIOGRAPHY N/A 06/23/2018   Procedure: RIGHT/LEFT HEART CATH AND CORONARY ANGIOGRAPHY;  Surgeon: Larey Dresser, MD;  Location: [place] [facility];  Service: Cardiovascular;  Laterality: N/A;        Home Medications    Prior to Admission medications   Medication Sig Start Date End Date Taking? Authorizing Provider  amLODipine (NORVASC) 10 MG tablet Take 1 tablet (10 mg total) by mouth daily. 06/15/18   Larey Dresser, MD  aspirin 81 MG tablet Take 2 tablets (162 mg total) by mouth daily. 06/14/18   Larey Dresser, MD  cloNIDine (CATAPRES) 0.2 MG tablet Take 0.2 mg by mouth 3 (three) times daily.      [provider]  furosemide (LASIX) 20 MG tablet Take 1 tablet (20 mg total) by mouth daily. 06/15/18 09/13/18  Larey Dresser, MD  glipiZIDE (GLUCOTROL) 10 MG tablet Take 10 mg by mouth 2 (two) times daily before a meal.      [provider]  HYDROcodone-acetaminophen (NORCO) 10-325 MG tablet Take 1 tablet by mouth 2 (two) times daily as needed.     [provider]  insulin detemir (  LEVEMIR) 100 UNIT/ML injection Inject 44 Units into the skin at bedtime.    [provider]  KRILL OIL PO Take 1 tablet by mouth daily.     [provider]  latanoprost (XALATAN) 0.005 % ophthalmic solution Place 1 drop into both eyes at bedtime.      [provider]  losartan (COZAAR) 100 MG tablet Take 100 mg by mouth daily.    [provider]  meclizine (ANTIVERT) 25 MG tablet Take 25 mg by mouth 3 (three) times daily as needed.    [provider]  meloxicam (MOBIC) 7.5 MG tablet Take 7.5 mg by mouth 2 (two) times daily as needed for pain.    [provider]  metFORMIN (GLUMETZA) 500 MG (MOD) 24 hr tablet Take 1,000 mg by mouth 2 (two) times  daily.     [provider]  metoprolol (TOPROL-XL) 50 MG 24 hr tablet Take 50 mg by mouth daily.     [provider]  solifenacin (VESICARE) 10 MG tablet Take 10 mg by mouth daily.    [provider]  umeclidinium-vilanterol (ANORO ELLIPTA) 62.5-25 MCG/INH AEPB Inhale 1 puff into the lungs daily. Patient not taking: Reported on 06/16/2018 01/10/18   Deneise Lever, MD    Family History Family History  Problem Relation Age of Onset  . Lung disease Father   . Stroke Father 54  . Diabetes Father   . Diabetes Maternal Grandfather   . Heart disease Paternal Grandmother   . Stroke Paternal Grandfather 83  . Diabetes Paternal Grandfather   . Diabetes Mother   . Cancer Mother   . Stroke Mother 19  . Diabetes Sister   . Diabetes Brother 9    Social History Social History   Tobacco Use  . Smoking status: Former Research scientist (life sciences)  . Smokeless tobacco: Never Used  . Tobacco comment: smoked when he was a teenager  Substance Use Topics  . Alcohol use: No  . Drug use: No     Allergies   Sulfa antibiotics   Review of Systems Review of Systems  Cardiovascular: Positive for chest pain.  All other systems reviewed and are negative.    Physical Exam Updated Vital Signs BP (!) 142/76 (BP Location: Right Arm)   Pulse 73   Temp 98.2 F (36.8 C) (Oral)   Resp 15   Ht _0  (1.727 m)   Wt (!) 145.2 kg   SpO2 91%   BMI 48.66 kg/m   Physical Exam  Constitutional: He is oriented to person, place, and time. He appears well-developed and well-nourished. No distress.  HENT:  Head: Normocephalic and atraumatic.  Right Ear: Hearing normal.  Left Ear: Hearing normal.  Nose: Nose normal.  Mouth/Throat: Oropharynx is clear and moist and mucous membranes are normal.  Eyes: Pupils are equal, round, and reactive to light. Conjunctivae and EOM are normal.  Neck: Normal range of motion. Neck supple.  Cardiovascular: Regular rhythm, S1 normal and S2 normal. Exam reveals  no gallop and no friction rub.  No murmur heard. Pulmonary/Chest: Effort normal and breath sounds normal. No respiratory distress. He exhibits no tenderness.  Abdominal: Soft. Normal appearance and bowel sounds are normal. There is no hepatosplenomegaly. There is no tenderness. There is no rebound, no guarding, no tenderness at McBurney's point and negative Murphy's sign. No hernia.  Musculoskeletal: Normal range of motion.  Neurological: He is alert and oriented to person, place, and time. He has normal strength. No cranial nerve deficit  or sensory deficit. Coordination normal. GCS eye subscore is 4. GCS verbal subscore is 5. GCS motor subscore is 6.  Skin: Skin is warm, dry and intact. No rash noted. No cyanosis.  Psychiatric: He has a normal mood and affect. His speech is normal and behavior is normal. Thought content normal.  Nursing note and vitals reviewed.    ED Treatments / Results  Labs (all labs ordered are listed, but only abnormal results are displayed) Labs Reviewed  BASIC METABOLIC PANEL  CBC  I-STAT TROPONIN, ED    EKG EKG Interpretation  Date/Time:  Friday July 15 2018 02:59:52 EDT Ventricular Rate:  74 PR Interval:    QRS Duration: 95 QT Interval:  402 QTC Calculation: 446 R Axis:   57 Text Interpretation:  Sinus rhythm Low voltage, precordial leads Otherwise within normal limits No significant change since last tracing Confirmed by Orpah Greek [phone]) on 07/15/2018 3:06:27 AM   Radiology No results found.  Procedures Procedures (including critical care time)  Medications Ordered in ED Medications  morphine 4 MG/ML injection 4 mg (has no administration in time range)  ondansetron (ZOFRAN) injection 4 mg (has no administration in time range)     Initial Impression / Assessment and Plan / ED Course  I have reviewed the triage vital signs and the nursing notes.  Pertinent labs & imaging results that were available during my care of the  patient were reviewed by me and considered in my medical decision making (see chart for details).     Patient presents to the ER for evaluation of chest pain.  Patient reports that he has been experiencing intermittent pains in the chest this week.  He does notice that when he lies down, pain improves, but it is not specifically caused by exertion.  He received nitroglycerin with only some improvement during transport to the ER.  His pain resolved when he was given morphine.  EKG is unremarkable.  Troponin negative x2.  Patient does have multiple cardiac risk factors.  He just had right and left heart cath 1 month ago, however.  He had no coronary obstructions noted on left heart cath, therefore his chest pain is likely not related to coronary artery disease.  For this reason it was felt that he was low risk and could have a delta troponin here in the ER which was negative.  He will follow-up with his cardiologist as an outpatient.  Reviewing his records reveals that he has had exertional dyspnea for more than a year.  This prompted the most recent work-up.  He has a history of pulmonary hypertension and is also currently being worked up for pulmonary sarcoidosis.  He has been ruled out for PE in the past.  He is not currently having any change in his respiratory status, no shortness of breath.  It is not felt that his current chest pain is related to PE and does not require work-up at this time.  Final Clinical Impressions(s) / ED Diagnoses   Final diagnoses:  Chest pain, unspecified type    ED Discharge Orders    None       Orpah Greek, MD 07/15/18 [id number]    Orpah Greek, MD 07/15/18 [phone]

## 2018-07-15 NOTE — ED Triage Notes (Signed)
Pt brought in by [facility] for c/o chest pain that started yesterday and went away; pt states he went to bed tonight and he woke up tonight with chest pain; cbg 190; pt took 325 aspirin at home prior to ems; pt was given nitro by ems with some relief; pt c/o mid chest pain

## 2018-07-19 DIAGNOSIS — Z6841 Body Mass Index (BMI) 40.0 and over, adult: Secondary | ICD-10-CM | POA: Diagnosis not present

## 2018-07-19 DIAGNOSIS — I10 Essential (primary) hypertension: Secondary | ICD-10-CM | POA: Diagnosis not present

## 2018-07-19 DIAGNOSIS — E119 Type 2 diabetes mellitus without complications: Secondary | ICD-10-CM | POA: Diagnosis not present

## 2018-07-19 DIAGNOSIS — Z23 Encounter for immunization: Secondary | ICD-10-CM | POA: Diagnosis not present

## 2018-07-19 DIAGNOSIS — E782 Mixed hyperlipidemia: Secondary | ICD-10-CM | POA: Diagnosis not present

## 2018-07-19 DIAGNOSIS — G894 Chronic pain syndrome: Secondary | ICD-10-CM | POA: Diagnosis not present

## 2018-07-21 NOTE — Telephone Encounter (Signed)
Pt is aware he needs VQ Scan, he is sch for non-contrast CT on 9/12, advised that does not show chronic PE.  Will try to arrange it to be done on the same day.  Pt is aware we will call him back to sch, order placed

## 2018-07-28 ENCOUNTER — Ambulatory Visit (HOSPITAL_COMMUNITY)
Admission: RE | Admit: 2018-07-28 | Discharge: 2018-07-28 | Disposition: A | Discharge status: Home / Self Care | Payer: Medicare Other | Source: Ambulatory Visit | Attending: Cardiology | Admitting: Cardiology

## 2018-07-28 ENCOUNTER — Ambulatory Visit (INDEPENDENT_AMBULATORY_CARE_PROVIDER_SITE_OTHER)
Admission: RE | Admit: 2018-07-28 | Discharge: 2018-07-28 | Disposition: A | Discharge status: Home / Self Care | Payer: Medicare Other | Source: Ambulatory Visit | Attending: Internal Medicine | Admitting: Internal Medicine

## 2018-07-28 VITALS — BP 152/80 | HR 79 | Wt 299.0 lb

## 2018-07-28 DIAGNOSIS — I2510 Atherosclerotic heart disease of native coronary artery without angina pectoris: Secondary | ICD-10-CM | POA: Insufficient documentation

## 2018-07-28 DIAGNOSIS — I270 Primary pulmonary hypertension: Secondary | ICD-10-CM | POA: Insufficient documentation

## 2018-07-28 DIAGNOSIS — Z7982 Long term (current) use of aspirin: Secondary | ICD-10-CM | POA: Diagnosis not present

## 2018-07-28 DIAGNOSIS — R918 Other nonspecific abnormal finding of lung field: Secondary | ICD-10-CM

## 2018-07-28 DIAGNOSIS — G4733 Obstructive sleep apnea (adult) (pediatric): Secondary | ICD-10-CM | POA: Insufficient documentation

## 2018-07-28 DIAGNOSIS — I10 Essential (primary) hypertension: Secondary | ICD-10-CM | POA: Insufficient documentation

## 2018-07-28 DIAGNOSIS — Z6841 Body Mass Index (BMI) 40.0 and over, adult: Secondary | ICD-10-CM | POA: Insufficient documentation

## 2018-07-28 DIAGNOSIS — Z9981 Dependence on supplemental oxygen: Secondary | ICD-10-CM | POA: Insufficient documentation

## 2018-07-28 DIAGNOSIS — Z794 Long term (current) use of insulin: Secondary | ICD-10-CM | POA: Diagnosis not present

## 2018-07-28 DIAGNOSIS — E119 Type 2 diabetes mellitus without complications: Secondary | ICD-10-CM | POA: Diagnosis not present

## 2018-07-28 DIAGNOSIS — R0609 Other forms of dyspnea: Secondary | ICD-10-CM | POA: Diagnosis not present

## 2018-07-28 DIAGNOSIS — Z79899 Other long term (current) drug therapy: Secondary | ICD-10-CM | POA: Diagnosis not present

## 2018-07-28 DIAGNOSIS — I272 Pulmonary hypertension, unspecified: Secondary | ICD-10-CM | POA: Diagnosis not present

## 2018-07-28 DIAGNOSIS — R0602 Shortness of breath: Secondary | ICD-10-CM | POA: Diagnosis not present

## 2018-07-28 DIAGNOSIS — E669 Obesity, unspecified: Secondary | ICD-10-CM | POA: Diagnosis not present

## 2018-07-28 MED ORDER — TECHNETIUM TC 99M DIETHYLENETRIAME-PENTAACETIC ACID
31.0000 | Freq: Once | INTRAVENOUS | Status: AC | PRN
  Administered 2018-07-28: 31 via RESPIRATORY_TRACT

## 2018-07-28 MED ORDER — TECHNETIUM TO 99M ALBUMIN AGGREGATED
4.2800 | Freq: Once | INTRAVENOUS | Status: AC | PRN
  Administered 2018-07-28: 4.28 via INTRAVENOUS

## 2018-07-28 MED ORDER — FUROSEMIDE 40 MG PO TABS: 40.0000 mg | ORAL_TABLET | Freq: Every day | ORAL | 3 refills | Status: DC

## 2018-07-28 NOTE — Patient Instructions (Signed)
Increase Furosemide to 40 mg daily  Lab today  Labs in 2 weeks  Your physician recommends that you schedule a follow-up appointment in: 3 months

## 2018-07-29 ENCOUNTER — Telehealth (HOSPITAL_COMMUNITY): Payer: Self-pay | Admitting: *Deleted

## 2018-07-29 LAB — ANGIOTENSIN CONVERTING ENZYME: ANGIOTENSIN-CONVERTING ENZYME: 24 U/L (ref 14–82)

## 2018-07-29 NOTE — Progress Notes (Signed)
PCP: Dr. Hilma Favors Cardiology: Dr. Domenic Polite HF Cardiology: Dr. Aundra Dubin  67 y.o. with history of OHS/OSA, chronic bronchitis, and suspected pulmonary hypertension was referred by Dr. Domenic Polite for evaluation of pulmonary hypertension. Patient is on 2L home oxygen and CPAP. He has had gradually worsening exertional dyspnea over the last year.  He was able to mow his grass last fall but cannot now.  He is short of breath after walking about 50-60 feet with his oxygen on.  He is short of breath with any incline.   Echo in 3/19 showed normal EF with PASP 72 mmHg.  I had him do left/right heart cath in 8/19.  This showed no CAD and moderate mixed pulmonary venous/pulmonary arterial hypertension.  Very mildly elevated filling pressures.    He is followed by pulmonary.  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 V/Q scan in 9/19 did not show evidence for chronic PE.   He returns today for followup of pulmonary hypertension, diastolic CHF.  His breathing is the same, still gets short of breath walking short distances.  He continues to have episodes of chest pain.  It has been on and off, no particular trigger.  He went to the ER with CP on 8/30, workup was negative and he was sent home.  The only thing that helps is hydrocodone.  BP high in the office today, but SBP 110s-120s at home.   Labs (11/18): BNP 21 Labs (7/19): LDL 79 Labs (8/19): K 3.9, creatinine 0.73 Labs (9/19): ACE normal  PMH: 1. OHS/OSA: Uses 2L home oxygen and CPAP.  2. Obesity 3. HTN 4. Type II diabetes 5. Chronic bronchitis: ?from welding.  Never smoked.    - CT chest (3/19): coronary calcification, mild emphysema, calcified mediastinal and hilar nodes consistent with prior granulomatous infection.  - PFTs (12/17): FVC 56%, FEV1 57%, ratio 101%, SVC 109%, DLCO 66%.  Moderate obstruction/severe restriction.  6. CAD: Coronary calcification on CT chest 3/19.    - Cardiolite in 2012 showed no ischemia.  - LHC (8/19): No significant CAD.  7. Pulmonary hypertension: Echo (3/19) with EF 65-70%, moderate LVH, PASP 72 mmHg.  - RHC (8/19): mean RA 8, PA 67/24 mean 43, mean PCWP 17, CI 2.99, PVR 3.5 WU.  - V/Q scan (9/19): No evidence for chronic PE.   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 Lives in [place].   Family History  Problem Relation Age of Onset  . Lung disease Father   . Stroke Father 44  . Diabetes Father   . Diabetes Maternal Grandfather   . Heart disease Paternal Grandmother   . Stroke Paternal Grandfather 51  . Diabetes Paternal Grandfather   . Diabetes Mother   . Cancer Mother   . Stroke Mother 43  . Diabetes Sister   . Diabetes Brother 36   ROS: All systems reviewed and negative except as per HPI.   Current Outpatient Medications  Medication Sig Dispense Refill  . amLODipine (NORVASC) 10 MG tablet Take 1 tablet (10 mg total) by mouth daily. 90 tablet 1  . aspirin 81 MG tablet Take 2 tablets (162 mg total) by mouth daily. 60 tablet 11  . cloNIDine (CATAPRES) 0.2 MG tablet Take 0.2 mg by mouth 3 (three) times daily.      . furosemide (LASIX) 40 MG tablet Take 1 tablet (40 mg total) by mouth daily. 90 tablet 3  .  glipiZIDE (GLUCOTROL) 10 MG tablet Take 10 mg by mouth 2 (two) times daily before a meal.      . HYDROcodone-acetaminophen (NORCO) 10-325 MG tablet Take 1 tablet by mouth 2 (two) times daily as needed.     . insulin detemir (LEVEMIR) 100 UNIT/ML injection Inject 44 Units into the skin at bedtime.    Marland Kitchen KRILL OIL PO Take 1 tablet by mouth daily.     Marland Kitchen latanoprost (XALATAN) 0.005 % ophthalmic solution Place 1 drop into both eyes at bedtime.      Marland Kitchen losartan (COZAAR) 100 MG tablet Take 100 mg by mouth daily.    . meclizine (ANTIVERT) 25 MG tablet Take 25 mg by mouth 3 (three) times daily as needed.    . meloxicam (MOBIC) 7.5 MG tablet Take 7.5 mg by mouth 2 (two) times daily as needed for pain.     . metFORMIN (GLUMETZA) 500 MG (MOD) 24 hr tablet Take 1,000 mg by mouth 2 (two) times daily.     . metoprolol (TOPROL-XL) 50 MG 24 hr tablet Take 50 mg by mouth daily.     . solifenacin (VESICARE) 10 MG tablet Take 10 mg by mouth daily.    Marland Kitchen umeclidinium-vilanterol (ANORO ELLIPTA) 62.5-25 MCG/INH AEPB Inhale 1 puff into the lungs daily. (Patient not taking: Reported on 06/16/2018) 1 each 0   No current facility-administered medications for this encounter.    BP (!) 152/80   Pulse 79   Wt 135.6 kg (299 lb)   SpO2 (!) 84%   BMI 45.46 kg/m  General: NAD Neck: Thick, JVP 8-9 cm, no thyromegaly or thyroid nodule.  Lungs: Distant breath sounds.  CV: Nondisplaced PMI.  Heart regular S1/S2, no S3/S4, no murmur. Trace ankle edema.  No carotid bruit.  Difficult to palpate pedal pulses.  Abdomen: Soft, nontender, no hepatosplenomegaly, no distention.  Skin: Intact without lesions or rashes.  Neurologic: Alert and oriented x 3.  Psych: Normal affect. Extremities: No clubbing or cyanosis.  HEENT: Normal.   Assessment/Plan: 1. Pulmonary hypertension: Patient has NYHA class IIIb dyspnea.  Echo in 3/19 showed estimated PA systolic pressure 72.  He has chronic bronchitis as well as OHS/OSA.  Interestingly, chest CT showed calcified mediastinal and hilar nodes suggestive of prior granulomatous infection => could this actually represent sarcoidosis?  ACE level is normal, and per his pulmonologist [name], sarcoidosis is unlikely. RHC showed mild pulmonary hypertension, mildly elevated PCWP.  Suspect there group 2 PH from mildly elevated PCWP but also group 3 PH from OHS/OSA and parenchymal lung disease.  Unlikely group 1.  PVR only 3.5 WU.  Suspect mild volume overload on exam.  - I will increase Lasix to 40 mg daily , BMET 10 days.  2. CAD: Coronary calcification on 3/19 chest CT.  Patient has a history of chest pain.  LHC (8/19) showed no significant coronary disease.  Suspect non-ischemic chest pain.    - Continue ASA 81.  - LDL was 79, will hold off on starting statin.  3. HTN: BP controlled.  4. OHS/OSA: Continue oxygen 2L during day and CPAP at night.   Loralie Champagne 07/29/2018

## 2018-07-29 NOTE — Telephone Encounter (Signed)
Result Notes for [facility] Pulmonary Perf and Vent   Notes recorded by Darron Doom, RN on 07/29/2018 at 11:47 AM EDT Patient aware, no further questions. ------  Notes recorded by Larey Dresser, MD on 07/28/2018 at 9:16 PM EDT No evidence for chronic PE

## 2018-08-10 ENCOUNTER — Telehealth: Payer: Self-pay | Admitting: Internal Medicine

## 2018-08-10 DIAGNOSIS — G4733 Obstructive sleep apnea (adult) (pediatric): Secondary | ICD-10-CM

## 2018-08-10 NOTE — Telephone Encounter (Signed)
Called patient he is requesting new CPAP supplies. Order sent. Nothing further needed.

## 2018-08-11 ENCOUNTER — Ambulatory Visit (HOSPITAL_COMMUNITY)
Admission: RE | Admit: 2018-08-11 | Discharge: 2018-08-11 | Disposition: A | Discharge status: Home / Self Care | Payer: Medicare Other | Source: Ambulatory Visit | Attending: Cardiology | Admitting: Cardiology

## 2018-08-11 DIAGNOSIS — I272 Pulmonary hypertension, unspecified: Secondary | ICD-10-CM

## 2018-08-11 LAB — BASIC METABOLIC PANEL
Anion gap: 12 (ref 5–15)
BUN: 20 mg/dL (ref 8–23)
CHLORIDE: 97 mmol/L — AB (ref 98–111)
CO2: 29 mmol/L (ref 22–32)
CREATININE: 1.13 mg/dL (ref 0.61–1.24)
Calcium: 10.5 mg/dL — ABNORMAL HIGH (ref 8.9–10.3)
GFR calc non Af Amer: >60 mL/min (ref >60)
Glucose, Bld: 115 mg/dL — ABNORMAL HIGH (ref 70–99)
POTASSIUM: 3.8 mmol/L (ref 3.5–5.1)
Sodium: 138 mmol/L (ref 135–145)

## 2018-09-15 ENCOUNTER — Encounter: Payer: Self-pay | Admitting: Internal Medicine

## 2018-09-15 ENCOUNTER — Ambulatory Visit (INDEPENDENT_AMBULATORY_CARE_PROVIDER_SITE_OTHER): Payer: Medicare Other | Admitting: Internal Medicine

## 2018-09-15 VITALS — BP 138/76 | HR 84 | Ht 68.0 in | Wt 289.4 lb

## 2018-09-15 DIAGNOSIS — G4733 Obstructive sleep apnea (adult) (pediatric): Secondary | ICD-10-CM | POA: Diagnosis not present

## 2018-09-15 DIAGNOSIS — J9611 Chronic respiratory failure with hypoxia: Secondary | ICD-10-CM | POA: Diagnosis not present

## 2018-09-15 DIAGNOSIS — I209 Angina pectoris, unspecified: Secondary | ICD-10-CM

## 2018-09-15 NOTE — Progress Notes (Signed)
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  Possibly fixed obstructive airways disease from remote asthma??. ----DOE, home O2, OSA, cough slight sputum blood tinged, stuffy nose, been blowing nose has some small clots, no wheezing CPAP download 97% compliance, AHI 14.3/hour-not specified central/obstructive.  High leak probably explains the lack of control. Occasional cough but produces small blood clot.  Hard to get clear description but it seems mostly this is from his nose.  He is not on warfarin.  Denies chest pain.  Does not want prednisone. Requalified today for his O2. BNP 10/09/17- 20.5   WNL CXR 10/09/17 IMPRESSION: Stable cardiomegaly and central pulmonary vascular congestion. No other significant abnormality seen.  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 Pt wears CPAP with O2 nightly. Also, continues to wear O2 during the daytime as well. DL attached.  Download 100% compliance AHI  0.7/hour Anoro Ellipta, V/Q scan wnl 07/28/18 Cardiology treated pulmonary hypertension by increasing amlodipine and Lasix. He asks about portable oxygen concentrator-discussed.Has not found Anoro helpful. CT chest 07/28/2018- IMPRESSION: 1. Nodule of concern on the prior examination in the left lower lobe has resolved indicative of a benign etiology. No suspicious pulmonary nodules or masses are noted on today's examination. 2. Aortic atherosclerosis, in addition to two vessel coronary artery disease. Please note that although the presence of coronary artery calcium documents the presence of coronary artery disease, the severity of this disease and any potential stenosis cannot be assessed on this non-gated CT examination. Assessment for potential risk factor modification, dietary therapy or pharmacologic therapy may be warranted, if clinically indicated. Aortic Atherosclerosis (ICD10-I70.0).  ROS-see HPI + = positive Constitutional:   No-   weight loss, night sweats, fevers, chills, +fatigue, lassitude. HEENT:   No-  headaches, difficulty swallowing, tooth/dental problems, sore throat,       No-  sneezing, itching, ear ache, nasal congestion, post nasal drip,  CV:  No-   chest pain, orthopnea, PND, swelling in lower extremities, anasarca, dizziness, palpitations Resp: + shortness of breath with exertion or at rest.              No-   productive cough,  Occasional non-productive cough,  No-  coughing up of blood.              No-   change in color of mucus.  Rare wheezing.   Skin: No-   rash or lesions. GI:  No-   heartburn, indigestion, abdominal pain, nausea, vomiting,  GU:  MS:  +   joint pain or swelling., + back pain Neuro-  Psych:  No- change in mood or affect.  +depression or anxiety.  No memory loss.  OBJ- Physical Exam    O2 2 L General- Alert, Oriented, Affect-appropriate, Distress- none  acute, +morbidly obese Skin- rash-none, lesions- none, excoriation-  none Lymphadenopathy- none Head- atraumatic            Eyes- Gross vision intact, PERRLA, conjunctivae and secretions clear            Ears- Hearing, canals-normal            Nose-  no-Septal dev, mucus+, No-polyps, erosion, perforation             Throat- Mallampati IV , mucosa clear , drainage- none, tonsils- atrophic, + mild hoarseness Neck- flexible , trachea midline, no stridor , thyroid nl, carotid no bruit Chest - symmetrical excursion , unlabored           Heart/CV- RRR , no murmur , no gallop  , no rub, nl s1 s2                           - JVD+ 1 cm, edema + elastic hose, stasis changes- , varices- none           Lung- clear to P&A, + shallow consistent with body habitus, wheeze- none, cough- none , dullness-none, rub- none           Chest wall-  Abd- Br/ Gen/ Rectal- Not done, not indicated Extrem- cyanosis- none, clubbing, none, atrophy- none, strength- nl, + elastic hose Neuro- grossly intact to observation

## 2018-09-15 NOTE — Patient Instructions (Signed)
We can continue CPAP 19 and O2 2L sleep and portable  Order- DME [facility] - evaluate for POC 2-3L demand for portable use   Dx chronic respiratory failure with hypoxia  Please call if we can help

## 2018-10-21 ENCOUNTER — Ambulatory Visit (INDEPENDENT_AMBULATORY_CARE_PROVIDER_SITE_OTHER): Payer: Medicare Other | Admitting: Urology

## 2018-10-21 DIAGNOSIS — N3941 Urge incontinence: Secondary | ICD-10-CM | POA: Diagnosis not present

## 2018-10-21 DIAGNOSIS — Z87442 Personal history of urinary calculi: Secondary | ICD-10-CM | POA: Diagnosis not present

## 2018-10-21 DIAGNOSIS — N5201 Erectile dysfunction due to arterial insufficiency: Secondary | ICD-10-CM

## 2018-10-24 DIAGNOSIS — Z6841 Body Mass Index (BMI) 40.0 and over, adult: Secondary | ICD-10-CM | POA: Diagnosis not present

## 2018-10-24 DIAGNOSIS — I10 Essential (primary) hypertension: Secondary | ICD-10-CM | POA: Diagnosis not present

## 2018-10-24 DIAGNOSIS — E782 Mixed hyperlipidemia: Secondary | ICD-10-CM | POA: Diagnosis not present

## 2018-10-24 DIAGNOSIS — Z125 Encounter for screening for malignant neoplasm of prostate: Secondary | ICD-10-CM | POA: Diagnosis not present

## 2018-10-24 DIAGNOSIS — R946 Abnormal results of thyroid function studies: Secondary | ICD-10-CM | POA: Diagnosis not present

## 2018-10-24 DIAGNOSIS — G4733 Obstructive sleep apnea (adult) (pediatric): Secondary | ICD-10-CM | POA: Diagnosis not present

## 2018-10-24 DIAGNOSIS — E119 Type 2 diabetes mellitus without complications: Secondary | ICD-10-CM | POA: Diagnosis not present

## 2018-10-24 DIAGNOSIS — G894 Chronic pain syndrome: Secondary | ICD-10-CM | POA: Diagnosis not present

## 2018-10-24 DIAGNOSIS — Z1389 Encounter for screening for other disorder: Secondary | ICD-10-CM | POA: Diagnosis not present

## 2018-10-28 ENCOUNTER — Encounter (HOSPITAL_COMMUNITY): Payer: Medicare Other | Admitting: Cardiology

## 2018-11-12 DIAGNOSIS — J9611 Chronic respiratory failure with hypoxia: Secondary | ICD-10-CM | POA: Insufficient documentation

## 2018-11-12 NOTE — Assessment & Plan Note (Signed)
He has not been able to move the needle on his significant chronic obesity problem.

## 2018-11-12 NOTE — Assessment & Plan Note (Signed)
Post important contributor is obesity hypoventilation. Plan-DME [facility] will be asked to provide portable oxygen concentrator, pulse regulator 2-3 L/min if they have it available.

## 2018-11-12 NOTE — Assessment & Plan Note (Signed)
Continues to benefit from CPAP and download confirms excellent compliance and control. Plan-continue CPAP 19 with supplemental oxygen.  Replace mask and supplies.

## 2018-11-25 DIAGNOSIS — Z23 Encounter for immunization: Secondary | ICD-10-CM | POA: Diagnosis not present

## 2018-11-25 DIAGNOSIS — Z6841 Body Mass Index (BMI) 40.0 and over, adult: Secondary | ICD-10-CM | POA: Diagnosis not present

## 2018-11-25 DIAGNOSIS — G894 Chronic pain syndrome: Secondary | ICD-10-CM | POA: Diagnosis not present

## 2018-12-27 DIAGNOSIS — M1991 Primary osteoarthritis, unspecified site: Secondary | ICD-10-CM | POA: Diagnosis not present

## 2018-12-27 DIAGNOSIS — Z6841 Body Mass Index (BMI) 40.0 and over, adult: Secondary | ICD-10-CM | POA: Diagnosis not present

## 2018-12-27 DIAGNOSIS — G894 Chronic pain syndrome: Secondary | ICD-10-CM | POA: Diagnosis not present

## 2018-12-27 DIAGNOSIS — G5702 Lesion of sciatic nerve, left lower limb: Secondary | ICD-10-CM | POA: Diagnosis not present

## 2019-01-13 ENCOUNTER — Ambulatory Visit (HOSPITAL_COMMUNITY)
Admission: RE | Admit: 2019-01-13 | Discharge: 2019-01-13 | Disposition: A | Discharge status: Home / Self Care | Payer: Medicare Other | Source: Ambulatory Visit | Attending: Cardiology | Admitting: Cardiology

## 2019-01-13 VITALS — BP 130/63 | HR 91 | Wt 282.6 lb

## 2019-01-13 DIAGNOSIS — Z7982 Long term (current) use of aspirin: Secondary | ICD-10-CM | POA: Insufficient documentation

## 2019-01-13 DIAGNOSIS — Z79899 Other long term (current) drug therapy: Secondary | ICD-10-CM | POA: Insufficient documentation

## 2019-01-13 DIAGNOSIS — Z8249 Family history of ischemic heart disease and other diseases of the circulatory system: Secondary | ICD-10-CM

## 2019-01-13 DIAGNOSIS — Z6841 Body Mass Index (BMI) 40.0 and over, adult: Secondary | ICD-10-CM | POA: Diagnosis not present

## 2019-01-13 DIAGNOSIS — K56609 Unspecified intestinal obstruction, unspecified as to partial versus complete obstruction: Secondary | ICD-10-CM | POA: Diagnosis not present

## 2019-01-13 DIAGNOSIS — E669 Obesity, unspecified: Secondary | ICD-10-CM | POA: Insufficient documentation

## 2019-01-13 DIAGNOSIS — K56600 Partial intestinal obstruction, unspecified as to cause: Secondary | ICD-10-CM | POA: Diagnosis not present

## 2019-01-13 DIAGNOSIS — Z794 Long term (current) use of insulin: Secondary | ICD-10-CM

## 2019-01-13 DIAGNOSIS — R0602 Shortness of breath: Secondary | ICD-10-CM | POA: Diagnosis not present

## 2019-01-13 DIAGNOSIS — G4733 Obstructive sleep apnea (adult) (pediatric): Secondary | ICD-10-CM

## 2019-01-13 DIAGNOSIS — E119 Type 2 diabetes mellitus without complications: Secondary | ICD-10-CM | POA: Insufficient documentation

## 2019-01-13 DIAGNOSIS — I2510 Atherosclerotic heart disease of native coronary artery without angina pectoris: Secondary | ICD-10-CM | POA: Insufficient documentation

## 2019-01-13 DIAGNOSIS — R531 Weakness: Secondary | ICD-10-CM | POA: Diagnosis not present

## 2019-01-13 DIAGNOSIS — R079 Chest pain, unspecified: Secondary | ICD-10-CM

## 2019-01-13 DIAGNOSIS — Z9981 Dependence on supplemental oxygen: Secondary | ICD-10-CM | POA: Diagnosis not present

## 2019-01-13 DIAGNOSIS — I272 Pulmonary hypertension, unspecified: Secondary | ICD-10-CM

## 2019-01-13 DIAGNOSIS — G8929 Other chronic pain: Secondary | ICD-10-CM | POA: Diagnosis not present

## 2019-01-13 DIAGNOSIS — R0600 Dyspnea, unspecified: Secondary | ICD-10-CM

## 2019-01-13 DIAGNOSIS — I5032 Chronic diastolic (congestive) heart failure: Secondary | ICD-10-CM | POA: Insufficient documentation

## 2019-01-13 DIAGNOSIS — I10 Essential (primary) hypertension: Secondary | ICD-10-CM | POA: Diagnosis not present

## 2019-01-13 DIAGNOSIS — R1011 Right upper quadrant pain: Secondary | ICD-10-CM | POA: Diagnosis not present

## 2019-01-13 DIAGNOSIS — R5381 Other malaise: Secondary | ICD-10-CM | POA: Diagnosis not present

## 2019-01-13 DIAGNOSIS — J9611 Chronic respiratory failure with hypoxia: Secondary | ICD-10-CM | POA: Diagnosis not present

## 2019-01-13 DIAGNOSIS — M545 Low back pain: Secondary | ICD-10-CM | POA: Diagnosis not present

## 2019-01-13 DIAGNOSIS — Z87891 Personal history of nicotine dependence: Secondary | ICD-10-CM | POA: Diagnosis not present

## 2019-01-13 DIAGNOSIS — R110 Nausea: Secondary | ICD-10-CM | POA: Diagnosis not present

## 2019-01-13 DIAGNOSIS — E662 Morbid (severe) obesity with alveolar hypoventilation: Secondary | ICD-10-CM | POA: Diagnosis not present

## 2019-01-13 LAB — BASIC METABOLIC PANEL
ANION GAP: 12 (ref 5–15)
BUN: 23 mg/dL (ref 8–23)
CHLORIDE: 102 mmol/L (ref 98–111)
CO2: 25 mmol/L (ref 22–32)
Calcium: 9.4 mg/dL (ref 8.9–10.3)
Creatinine, Ser: 0.95 mg/dL (ref 0.61–1.24)
GFR calc non Af Amer: >60 mL/min (ref >60)
Glucose, Bld: 141 mg/dL — ABNORMAL HIGH (ref 70–99)
POTASSIUM: 3.8 mmol/L (ref 3.5–5.1)
Sodium: 139 mmol/L (ref 135–145)

## 2019-01-13 NOTE — Patient Instructions (Signed)
Labs were done today. We will call you with any ABNORMAL results. No news is good news!  EKG was completed.  Your physician recommends that you schedule a follow-up appointment in: 3 MONTHS

## 2019-01-14 ENCOUNTER — Emergency Department (HOSPITAL_COMMUNITY): Payer: Medicare Other

## 2019-01-14 ENCOUNTER — Encounter (HOSPITAL_COMMUNITY): Payer: Self-pay | Admitting: Emergency Medicine

## 2019-01-14 ENCOUNTER — Other Ambulatory Visit: Payer: Self-pay

## 2019-01-14 ENCOUNTER — Inpatient Hospital Stay (HOSPITAL_COMMUNITY)
Admission: EM | Admit: 2019-01-14 | Discharge: 2019-01-19 | DRG: 389 | Disposition: A | Discharge status: Home Health | Payer: Medicare Other | Attending: Family Medicine | Admitting: Family Medicine

## 2019-01-14 DIAGNOSIS — Z79899 Other long term (current) drug therapy: Secondary | ICD-10-CM | POA: Diagnosis not present

## 2019-01-14 DIAGNOSIS — Z8249 Family history of ischemic heart disease and other diseases of the circulatory system: Secondary | ICD-10-CM | POA: Diagnosis not present

## 2019-01-14 DIAGNOSIS — R0602 Shortness of breath: Secondary | ICD-10-CM | POA: Diagnosis not present

## 2019-01-14 DIAGNOSIS — E662 Morbid (severe) obesity with alveolar hypoventilation: Secondary | ICD-10-CM | POA: Diagnosis not present

## 2019-01-14 DIAGNOSIS — Z87891 Personal history of nicotine dependence: Secondary | ICD-10-CM

## 2019-01-14 DIAGNOSIS — K56600 Partial intestinal obstruction, unspecified as to cause: Secondary | ICD-10-CM | POA: Diagnosis not present

## 2019-01-14 DIAGNOSIS — Z833 Family history of diabetes mellitus: Secondary | ICD-10-CM | POA: Diagnosis not present

## 2019-01-14 DIAGNOSIS — R1011 Right upper quadrant pain: Secondary | ICD-10-CM | POA: Diagnosis not present

## 2019-01-14 DIAGNOSIS — R5381 Other malaise: Secondary | ICD-10-CM | POA: Diagnosis present

## 2019-01-14 DIAGNOSIS — M545 Low back pain: Secondary | ICD-10-CM | POA: Diagnosis present

## 2019-01-14 DIAGNOSIS — J9611 Chronic respiratory failure with hypoxia: Secondary | ICD-10-CM | POA: Diagnosis not present

## 2019-01-14 DIAGNOSIS — E119 Type 2 diabetes mellitus without complications: Secondary | ICD-10-CM

## 2019-01-14 DIAGNOSIS — Z791 Long term (current) use of non-steroidal anti-inflammatories (NSAID): Secondary | ICD-10-CM | POA: Diagnosis not present

## 2019-01-14 DIAGNOSIS — Z9981 Dependence on supplemental oxygen: Secondary | ICD-10-CM | POA: Diagnosis not present

## 2019-01-14 DIAGNOSIS — Z882 Allergy status to sulfonamides status: Secondary | ICD-10-CM

## 2019-01-14 DIAGNOSIS — I10 Essential (primary) hypertension: Secondary | ICD-10-CM | POA: Diagnosis not present

## 2019-01-14 DIAGNOSIS — Z6841 Body Mass Index (BMI) 40.0 and over, adult: Secondary | ICD-10-CM | POA: Diagnosis not present

## 2019-01-14 DIAGNOSIS — R110 Nausea: Secondary | ICD-10-CM | POA: Diagnosis not present

## 2019-01-14 DIAGNOSIS — R531 Weakness: Secondary | ICD-10-CM | POA: Diagnosis not present

## 2019-01-14 DIAGNOSIS — G8929 Other chronic pain: Secondary | ICD-10-CM | POA: Diagnosis present

## 2019-01-14 DIAGNOSIS — K56609 Unspecified intestinal obstruction, unspecified as to partial versus complete obstruction: Secondary | ICD-10-CM

## 2019-01-14 DIAGNOSIS — Z7982 Long term (current) use of aspirin: Secondary | ICD-10-CM | POA: Diagnosis not present

## 2019-01-14 DIAGNOSIS — Z794 Long term (current) use of insulin: Secondary | ICD-10-CM | POA: Diagnosis not present

## 2019-01-14 LAB — URINALYSIS, ROUTINE W REFLEX MICROSCOPIC
BACTERIA UA: NONE SEEN
BILIRUBIN URINE: NEGATIVE
GLUCOSE, UA: NEGATIVE mg/dL
Hgb urine dipstick: NEGATIVE
KETONES UR: 20 mg/dL — AB
Leukocytes,Ua: NEGATIVE
Nitrite: NEGATIVE
PH: 7 (ref 5.0–8.0)
Protein, ur: 30 mg/dL — AB
SPECIFIC GRAVITY, URINE: 1.018 (ref 1.005–1.030)

## 2019-01-14 LAB — COMPREHENSIVE METABOLIC PANEL
ALT: 15 U/L (ref 0–44)
AST: 24 U/L (ref 15–41)
Albumin: 4.8 g/dL (ref 3.5–5.0)
Alkaline Phosphatase: 59 U/L (ref 38–126)
Anion gap: 12 (ref 5–15)
BUN: 23 mg/dL (ref 8–23)
CHLORIDE: 99 mmol/L (ref 98–111)
CO2: 30 mmol/L (ref 22–32)
CREATININE: 0.83 mg/dL (ref 0.61–1.24)
Calcium: 9.3 mg/dL (ref 8.9–10.3)
GFR calc non Af Amer: >60 mL/min (ref >60)
Glucose, Bld: 174 mg/dL — ABNORMAL HIGH (ref 70–99)
Potassium: 3.8 mmol/L (ref 3.5–5.1)
SODIUM: 141 mmol/L (ref 135–145)
Total Bilirubin: 0.6 mg/dL (ref 0.3–1.2)
Total Protein: 8.9 g/dL — ABNORMAL HIGH (ref 6.5–8.1)

## 2019-01-14 LAB — CBC
HEMATOCRIT: 43.8 % (ref 39.0–52.0)
Hemoglobin: 14.2 g/dL (ref 13.0–17.0)
MCH: 31.1 pg (ref 26.0–34.0)
MCHC: 32.4 g/dL (ref 30.0–36.0)
MCV: 96.1 fL (ref 80.0–100.0)
NRBC: 0 % (ref 0.0–0.2)
PLATELETS: 320 10*3/uL (ref 150–400)
RBC: 4.56 MIL/uL (ref 4.22–5.81)
RDW: 13.3 % (ref 11.5–15.5)
WBC: 12.9 10*3/uL — ABNORMAL HIGH (ref 4.0–10.5)

## 2019-01-14 LAB — CBG MONITORING, ED: Glucose-Capillary: 143 mg/dL — ABNORMAL HIGH (ref 70–99)

## 2019-01-14 LAB — LIPASE, BLOOD: LIPASE: 17 U/L (ref 11–51)

## 2019-01-14 MED ORDER — IOHEXOL 300 MG/ML  SOLN
100.0000 mL | Freq: Once | INTRAMUSCULAR | Status: AC | PRN
  Administered 2019-01-14: 100 mL via INTRAVENOUS

## 2019-01-14 MED ORDER — INSULIN DETEMIR 100 UNIT/ML ~~LOC~~ SOLN
20.0000 [IU] | Freq: Every day | SUBCUTANEOUS | Status: DC | PRN
  Filled 2019-01-14: qty 0.2

## 2019-01-14 MED ORDER — ENOXAPARIN SODIUM 40 MG/0.4ML ~~LOC~~ SOLN
40.0000 mg | SUBCUTANEOUS | Status: DC
  Administered 2019-01-14: 40 mg via SUBCUTANEOUS
  Filled 2019-01-14: qty 0.4

## 2019-01-14 MED ORDER — ASPIRIN EC 81 MG PO TBEC: 162.5000 mg | DELAYED_RELEASE_TABLET | Freq: Every day | ORAL | Status: DC

## 2019-01-14 MED ORDER — AMLODIPINE BESYLATE 5 MG PO TABS
10.0000 mg | ORAL_TABLET | Freq: Every day | ORAL | Status: DC
  Administered 2019-01-15 – 2019-01-19 (×4): 10 mg via ORAL
  Filled 2019-01-14 (×6): qty 2

## 2019-01-14 MED ORDER — ONDANSETRON HCL 4 MG/2ML IJ SOLN
4.0000 mg | Freq: Once | INTRAMUSCULAR | Status: AC
  Administered 2019-01-14: 4 mg via INTRAVENOUS
  Filled 2019-01-14: qty 2

## 2019-01-14 MED ORDER — CLONIDINE HCL 0.2 MG PO TABS
0.2000 mg | ORAL_TABLET | Freq: Three times a day (TID) | ORAL | Status: DC
  Administered 2019-01-14 – 2019-01-15 (×4): 0.2 mg via ORAL
  Filled 2019-01-14 (×5): qty 1

## 2019-01-14 MED ORDER — LOSARTAN POTASSIUM 50 MG PO TABS
100.0000 mg | ORAL_TABLET | Freq: Every day | ORAL | Status: DC
  Administered 2019-01-15 – 2019-01-19 (×4): 100 mg via ORAL
  Filled 2019-01-14: qty 2
  Filled 2019-01-14: qty 4
  Filled 2019-01-14 (×4): qty 2

## 2019-01-14 MED ORDER — INSULIN ASPART 100 UNIT/ML ~~LOC~~ SOLN
0.0000 [IU] | Freq: Four times a day (QID) | SUBCUTANEOUS | Status: DC
  Administered 2019-01-14: 2 [IU] via SUBCUTANEOUS
  Administered 2019-01-15: 3 [IU] via SUBCUTANEOUS
  Administered 2019-01-15: 2 [IU] via SUBCUTANEOUS
  Administered 2019-01-15 – 2019-01-16 (×2): 3 [IU] via SUBCUTANEOUS
  Administered 2019-01-16: 2 [IU] via SUBCUTANEOUS
  Administered 2019-01-16: 3 [IU] via SUBCUTANEOUS
  Administered 2019-01-17 (×3): 2 [IU] via SUBCUTANEOUS
  Administered 2019-01-17: 3 [IU] via SUBCUTANEOUS
  Administered 2019-01-18 (×2): 2 [IU] via SUBCUTANEOUS
  Administered 2019-01-19: 3 [IU] via SUBCUTANEOUS
  Filled 2019-01-14: qty 1

## 2019-01-14 MED ORDER — LATANOPROST 0.005 % OP SOLN
1.0000 [drp] | Freq: Every day | OPHTHALMIC | Status: DC
  Administered 2019-01-14 – 2019-01-18 (×4): 1 [drp] via OPHTHALMIC
  Filled 2019-01-14 (×3): qty 2.5

## 2019-01-14 MED ORDER — METOPROLOL SUCCINATE ER 50 MG PO TB24
50.0000 mg | ORAL_TABLET | Freq: Every day | ORAL | Status: DC
  Administered 2019-01-14 – 2019-01-19 (×5): 50 mg via ORAL
  Filled 2019-01-14 (×4): qty 1
  Filled 2019-01-14: qty 2
  Filled 2019-01-14: qty 1

## 2019-01-14 MED ORDER — MORPHINE SULFATE (PF) 2 MG/ML IV SOLN
2.0000 mg | INTRAVENOUS | Status: DC | PRN
  Administered 2019-01-14 – 2019-01-17 (×5): 2 mg via INTRAVENOUS
  Filled 2019-01-14 (×6): qty 1

## 2019-01-14 MED ORDER — SODIUM CHLORIDE 0.9 % IV SOLN
INTRAVENOUS | Status: DC
  Administered 2019-01-14 (×2): via INTRAVENOUS

## 2019-01-14 MED ORDER — ENOXAPARIN SODIUM 80 MG/0.8ML ~~LOC~~ SOLN
0.5000 mg/kg | SUBCUTANEOUS | Status: DC
  Administered 2019-01-15 – 2019-01-18 (×3): 65 mg via SUBCUTANEOUS
  Filled 2019-01-14 (×4): qty 0.8

## 2019-01-14 MED ORDER — SODIUM CHLORIDE 0.9% FLUSH
3.0000 mL | Freq: Once | INTRAVENOUS | Status: AC
  Administered 2019-01-14: 3 mL via INTRAVENOUS

## 2019-01-14 MED ORDER — PROMETHAZINE HCL 12.5 MG PO TABS: 12.5000 mg | ORAL_TABLET | Freq: Four times a day (QID) | ORAL | Status: DC | PRN

## 2019-01-14 MED ORDER — FAMOTIDINE IN NACL 20-0.9 MG/50ML-% IV SOLN
20.0000 mg | Freq: Two times a day (BID) | INTRAVENOUS | Status: DC
  Administered 2019-01-14 – 2019-01-16 (×4): 20 mg via INTRAVENOUS
  Filled 2019-01-14 (×4): qty 50

## 2019-01-14 NOTE — H&P (Signed)
History and Physical  Colton Jackson. IHK:[id number] DOB: July 03, 1951 DOA: 01/14/2019  Referring physician: Dr Rogene Houston, ED physician PCP: Sharilyn Sites, MD  Outpatient Specialists:   Patient Coming From: home  Chief Complaint: abdominal pain  HPI: Colton Jackson. is a 68 y.o. male with a history of hypertension, diabetes, chronic back pain, chronic respiratory failure on 2 L nasal cannula at home, obesity hypoventilatory syndrome, obstructive sleep apnea with CPAP use at night.  Patient seen for abdominal pain that started earlier this morning, along with nausea pain located in the right upper quadrant.  No fluctuation to the patient's pain: No improvement or worsening.  Pain is fairly constant rated 5 out of 10.  No other palliating or provoking factors.  He has had longstanding history of constipation over 8 to 9 years.  His last bowel movement was approximately a week ago.  Emergency Department Course: CT shows small bowel obstruction with inflection point of right upper quadrant.  White count 12  Review of Systems:   Pt complains of unintentional 50 pound weight loss in 6 months.  Pt denies any fevers, chills, night sweats, nausea, vomiting, diarrhea, constipation, abdominal pain, shortness of breath, dyspnea on exertion, orthopnea, cough, wheezing, palpitations, headache, vision changes, lightheadedness, dizziness, melena, rectal bleeding.  Review of systems are otherwise negative  Past Medical History:  Diagnosis Date  . Allergic rhinitis   . Degenerative disk disease   . Essential hypertension   . H/O cardiovascular stress test 02/02/11   EF 59% - Normal stress test  . Knee pain, bilateral   . Lower back pain   . Morbid obesity ([place])   . OSA (obstructive sleep apnea)    On CPAP  . Type 2 diabetes mellitus ([place])    Past Surgical History:  Procedure Laterality Date  . APPENDECTOMY    . Cataract surgery Bilateral   . KNEE ARTHROSCOPY Bilateral   . NASAL SEPTOPLASTY W/  TURBINOPLASTY    . RIGHT/LEFT HEART CATH AND CORONARY ANGIOGRAPHY N/A 06/23/2018   Procedure: RIGHT/LEFT HEART CATH AND CORONARY ANGIOGRAPHY;  Surgeon: Larey Dresser, MD;  Location: [facility];  Service: Cardiovascular;  Laterality: N/A;   Social History:  reports that he has quit smoking. He has never used smokeless tobacco. He reports that he does not drink alcohol or use drugs. Patient lives at home  Allergies  Allergen Reactions  . Sulfa Antibiotics Swelling    eyelids    Family History  Problem Relation Age of Onset  . Lung disease Father   . Stroke Father 27  . Diabetes Father   . Diabetes Maternal Grandfather   . Heart disease Paternal Grandmother   . Stroke Paternal Grandfather 5  . Diabetes Paternal Grandfather   . Diabetes Mother   . Cancer Mother   . Stroke Mother 54  . Diabetes Sister   . Diabetes Brother 74      Prior to Admission medications   Medication Sig Start Date End Date Taking? Authorizing Provider  acetaminophen (TYLENOL) 500 MG tablet Take 500 mg by mouth every 6 (six) hours as needed for mild pain or moderate pain (In addition to Hydrocodone/Apap as needed for pain).   Yes [provider]  amLODipine (NORVASC) 10 MG tablet Take 1 tablet (10 mg total) by mouth daily. 06/15/18  Yes Larey Dresser, MD  aspirin 325 MG EC tablet Take 162.5 mg by mouth daily.   Yes [provider]  cloNIDine (CATAPRES) 0.2 MG tablet Take 0.2  mg by mouth 3 (three) times daily.     Yes [provider]  furosemide (LASIX) 40 MG tablet Take 1 tablet (40 mg total) by mouth daily. 07/28/18 01/14/19 Yes Larey Dresser, MD  glipiZIDE (GLUCOTROL XL) 10 MG 24 hr tablet Take 10 mg by mouth 2 (two) times daily. 12/20/18  Yes [provider]  HYDROcodone-acetaminophen (NORCO) 10-325 MG tablet Take 1 tablet by mouth 3 (three) times daily as needed for moderate pain.    Yes [provider]  insulin detemir (LEVEMIR) 100 UNIT/ML injection  Inject 30-40 Units into the skin daily as needed (for high blood sugar levels).    Yes [provider]  KRILL OIL PO Take 1 tablet by mouth daily.    Yes [provider]  latanoprost (XALATAN) 0.005 % ophthalmic solution Place 1 drop into both eyes at bedtime.     Yes [provider]  losartan (COZAAR) 100 MG tablet Take 100 mg by mouth daily.   Yes [provider]  meclizine (ANTIVERT) 25 MG tablet Take 25 mg by mouth 3 (three) times daily as needed for dizziness or nausea (when taking hydrocodone/apap).    Yes [provider]  meloxicam (MOBIC) 7.5 MG tablet Take 7.5 mg by mouth 2 (two) times daily.    Yes [provider]  metFORMIN (GLUCOPHAGE-XR) 500 MG 24 hr tablet Take 1,000 mg by mouth 2 (two) times daily. 12/19/18  Yes [provider]  metoprolol (TOPROL-XL) 50 MG 24 hr tablet Take 50 mg by mouth daily.    Yes [provider]  trospium (SANCTURA) 20 MG tablet Take 20 mg by mouth 2 (two) times daily. 11/24/18  Yes [provider]    Physical Exam: BP (!) 158/91   Pulse 87   Temp 98.1 F (36.7 C) (Oral)   Resp 18   Ht _0  (1.702 m)   Wt 127.9 kg   SpO2 100%   BMI 44.17 kg/m   . General: Elderly black male. Awake and alert and oriented x3. No acute cardiopulmonary distress.  Marland Kitchen HEENT: Normocephalic atraumatic.  Right and left ears normal in appearance.  Pupils equal, round, reactive to light. Extraocular muscles are intact. Sclerae anicteric and noninjected.  Moist mucosal membranes. No mucosal lesions.  . Neck: Neck supple without lymphadenopathy. No carotid bruits. No masses palpated.  . Cardiovascular: Regular rate with normal S1-S2 sounds. No murmurs, rubs, gallops auscultated. No JVD.  Marland Kitchen Respiratory: Good respiratory effort with no wheezes, rales, rhonchi. Lungs clear to auscultation bilaterally.  No accessory muscle use. . Abdomen: Soft, mild tenderness in the right upper quadrant without rebound or  guarding, nondistended. Active bowel sounds. No masses or hepatosplenomegaly  . Skin: No rashes, lesions, or ulcerations.  Dry, warm to touch. 2+ dorsalis pedis and radial pulses. . Musculoskeletal: No calf or leg pain. All major joints not erythematous nontender.  No upper or lower joint deformation.  Good ROM.  No contractures  . Psychiatric: Intact judgment and insight. Pleasant and cooperative. . Neurologic: No focal neurological deficits. Strength is 5/5 and symmetric in upper and lower extremities.  Cranial nerves II through XII are grossly intact.           Labs on Admission: I have personally reviewed following labs and imaging studies  CBC: Recent Labs  Lab 01/14/19 1322  WBC 12.9*  HGB 14.2  HCT 43.8  MCV 96.1  PLT 973   Basic Metabolic Panel: Recent Labs  Lab 01/13/19 1523 01/14/19  1322  NA 139 141  K 3.8 3.8  CL 102 99  CO2 25 30  GLUCOSE 141* 174*  BUN 23 23  CREATININE 0.95 0.83  CALCIUM 9.4 9.3   GFR: Estimated Creatinine Clearance: 112.4 mL/min (by C-G formula based on SCr of 0.83 mg/dL). Liver Function Tests: Recent Labs  Lab 01/14/19 1322  AST 24  ALT 15  ALKPHOS 59  BILITOT 0.6  PROT 8.9*  ALBUMIN 4.8   Recent Labs  Lab 01/14/19 1322  LIPASE 17   No results for input(s): AMMONIA in the last 168 hours. Coagulation Profile: No results for input(s): INR, PROTIME in the last 168 hours. Cardiac Enzymes: No results for input(s): CKTOTAL, CKMB, CKMBINDEX, TROPONINI in the last 168 hours. BNP (last 3 results) No results for input(s): PROBNP in the last 8760 hours. HbA1C: No results for input(s): HGBA1C in the last 72 hours. CBG: No results for input(s): GLUCAP in the last 168 hours. Lipid Profile: No results for input(s): CHOL, HDL, LDLCALC, TRIG, CHOLHDL, LDLDIRECT in the last 72 hours. Thyroid Function Tests: No results for input(s): TSH, T4TOTAL, FREET4, T3FREE, THYROIDAB in the last 72 hours. Anemia Panel: No results for input(s):  VITAMINB12, FOLATE, FERRITIN, TIBC, IRON, RETICCTPCT in the last 72 hours. Urine analysis:    Component Value Date/Time   COLORURINE YELLOW 01/14/2019 [place] 01/14/2019 1255   LABSPEC 1.018 01/14/2019 1255   PHURINE 7.0 01/14/2019 1255   [place] 01/14/2019 1255   [place] 01/14/2019 [place] 01/14/2019 1255   KETONESUR 20 (A) 01/14/2019 1255   PROTEINUR 30 (A) 01/14/2019 1255   NITRITE NEGATIVE 01/14/2019 1255   LEUKOCYTESUR NEGATIVE 01/14/2019 1255   Sepsis Labs: _0 (procalcitonin:4,lacticidven:4) )No results found for this or any previous visit (from the past 240 hour(s)).   Radiological Exams on Admission: Dg Chest 2 View  Result Date: 01/14/2019 CLINICAL DATA:  Acute shortness of breath. EXAM: CHEST - 2 VIEW COMPARISON:  07/28/2018 and prior radiographs FINDINGS: Cardiomegaly and increased vascularity again noted. Mild peribronchial thickening is unchanged. There is no evidence of focal airspace disease, pulmonary edema, suspicious pulmonary nodule/mass, pleural effusion, or pneumothorax. No acute bony abnormalities are identified. IMPRESSION: Cardiomegaly without evidence of acute cardiopulmonary disease. Increased pulmonary vascularity and mild chronic peribronchial thickening again noted. Electronically Signed   By: Margarette Canada M.D.   On: 01/14/2019 15:04   Ct Abdomen Pelvis W Contrast  Result Date: 01/14/2019 CLINICAL DATA:  Abdominal pain. EXAM: CT ABDOMEN AND PELVIS WITH CONTRAST TECHNIQUE: Multidetector CT imaging of the abdomen and pelvis was performed using the standard protocol following bolus administration of intravenous contrast. CONTRAST:  113m OMNIPAQUE IOHEXOL 300 MG/ML  SOLN COMPARISON:  05/02/2015 FINDINGS: Lower chest: No acute abnormality. Similar appearance of multiple hyperdense lymph nodes within the mediastinum and hilar regions which may be the sequelae of prior granulomatous disease. Hepatobiliary:  The caudate lobe and lateral segment of left lobe of liver appear enlarged. No focal liver abnormality identified. The gallbladder is normal. No biliary dilatation. Pancreas: Unremarkable. No pancreatic ductal dilatation or surrounding inflammatory changes. Spleen: Normal in size without focal abnormality. Adrenals/Urinary Tract: Normal appearance of the adrenal glands. No kidney mass or hydronephrosis identified. The urinary bladder appears within normal limits. Stomach/Bowel: The stomach appears distended. The proximal small bowel loops are dilated measuring up to 4 cm in diameter. Fecalization of the dilated mid small bowel loops noted. Within the right upper quadrant of the abdomen there is a transition point  to decreased caliber distal small bowel, image 34/5, and image 43/3. A large stool burden is identified throughout the colon which may reflect constipation. Vascular/Lymphatic: Aortic atherosclerosis without aneurysm. The portal vein appears patent. No adenopathy. Reproductive: Prostate is unremarkable. Other: No free fluid or fluid collection within the abdomen or pelvis. Musculoskeletal: Spondylosis within the thoracolumbar spine. No aggressive lytic or sclerotic bone lesions identified. IMPRESSION: 1. Small bowel obstruction. A transition point is identified within the right upper quadrant of the abdomen. Findings may reflect sequelae of adhesions. No mass identified. 2. Large stool burden within the colon which may reflect constipation. 3.  Aortic Atherosclerosis (ICD10-I70.0). 4. Morphologic features of the liver which may reflect early cirrhosis. Electronically Signed   By: Kerby Moors M.D.   On: 01/14/2019 15:05    EKG: Independently reviewed.  EKG performed yesterday showing normal sinus rhythm  Assessment/Plan: Principal Problem:   SBO (small bowel obstruction) (HCC) Active Problems:   Type 2 diabetes mellitus without complication (HCC)   Essential hypertension   Obesity  hypoventilation syndrome ([place])   Chronic respiratory failure with hypoxia ([place])    This patient was discussed with the ED physician, including pertinent vitals, physical exam findings, labs, and imaging.  We also discussed care given by the ED provider.  1. Small bowel obstruction a. Admit b. General surgery consulted c. N.p.o. except for sips with meds d. We will hold anticholinergic and urine narcotic e. Pepcid for gastric protection 2. Type 2 diabetes a. We will hold oral hypoglycemics b. Reduce Lantus insulin to 20 units with sliding scale insulin every 6 hours 3. Hypertension a. Continue with antihypertensives 4. Obesity hypoventilatory syndrome a. Continue nasal cannula with CPAP at night 5. Chronic respiratory failure with hypoxia  DVT prophylaxis: Lovenox Consultants: General surgery Code Status: Full code Family Communication: Wife present during interview Disposition Plan: Patient to go home following admission   Truett Mainland, DO

## 2019-01-14 NOTE — ED Notes (Signed)
Sat patient up in bed and fixed BP cuff.

## 2019-01-14 NOTE — ED Notes (Signed)
ED TO INPATIENT HANDOFF REPORT  ED Nurse Name and Phone #:   S Name/Age/Gender Colton Jackson. 68 y.o. male Room/Bed: APA09/APA09  Code Status   Code Status: Full Code  Home/SNF/Other home coa X4 Is this baseline?   Triage Complete: Triage complete  Chief Complaint Abdominal Pain  Triage Note Patient c/o lower abd pain and bilateral flank pain that started this morning. Patient reports nausea without vomiting. Denies an diarrhea, fevers, or urinary. Per patient recently bought a space heater and plugged it in last night, per patient fumes filled the air and that "is all he can smell." Patient thinks pain and nausea is related.   Allergies Allergies  Allergen Reactions  . Sulfa Antibiotics Swelling    eyelids    Level of Care/Admitting Diagnosis ED Disposition    ED Disposition Condition [facility] Area: [facility] [[id number]]  Level of Care: Med-Surg [16]  Diagnosis: SBO (small bowel obstruction) [facility]) [[id number]]  Admitting Physician: Truett Mainland [[id number]]  Attending Physician: Truett Mainland [[id number]]  Estimated length of stay: 3 - 4 days  Certification:: I certify this patient will need inpatient services for at least 2 midnights  PT Class (Do Not Modify): Inpatient [101]  PT Acc Code (Do Not Modify): Private [1]       B Medical/Surgery History Past Medical History:  Diagnosis Date  . Allergic rhinitis   . Degenerative disk disease   . Essential hypertension   . H/O cardiovascular stress test 02/02/11   EF 59% - Normal stress test  . Knee pain, bilateral   . Lower back pain   . Morbid obesity ([place])   . OSA (obstructive sleep apnea)    On CPAP  . Type 2 diabetes mellitus ([place])    Past Surgical History:  Procedure Laterality Date  . APPENDECTOMY    . Cataract surgery Bilateral   . KNEE ARTHROSCOPY Bilateral   . NASAL SEPTOPLASTY W/ TURBINOPLASTY    . RIGHT/LEFT HEART CATH AND CORONARY ANGIOGRAPHY N/A 06/23/2018   Procedure:  RIGHT/LEFT HEART CATH AND CORONARY ANGIOGRAPHY;  Surgeon: Larey Dresser, MD;  Location: [place] [facility];  Service: Cardiovascular;  Laterality: N/A;     A IV Location/Drains/Wounds Patient Lines/Drains/Airways Status   Active Line/Drains/Airways    Name:   Placement date:   Placement time:   Site:   Days:   Peripheral IV 01/14/19 Left Antecubital   01/14/19    1326    Antecubital   less than 1          Intake/Output Last 24 hours No intake or output data in the 24 hours ending 01/14/19 2133  Labs/Imaging Results for orders placed or performed during the hospital encounter of 01/14/19 (from the past 48 hour(s))  Urinalysis, Routine w reflex microscopic     Status: Abnormal   Collection Time: 01/14/19 12:55 PM  Result Value Ref Range   Color, Urine YELLOW YELLOW   APPearance CLEAR CLEAR   Specific Gravity, Urine 1.018 1.005 - 1.030   pH 7.0 5.0 - 8.0   Glucose, UA NEGATIVE NEGATIVE mg/dL   Hgb urine dipstick NEGATIVE NEGATIVE   Bilirubin Urine NEGATIVE NEGATIVE   Ketones, ur 20 (A) NEGATIVE mg/dL   Protein, ur 30 (A) NEGATIVE mg/dL   Nitrite NEGATIVE NEGATIVE   Leukocytes,Ua NEGATIVE NEGATIVE   RBC / HPF 0-5 0 - 5 RBC/hpf   WBC, UA 0-5 0 - 5 WBC/hpf   Bacteria, UA NONE SEEN NONE  SEEN    Comment: Performed at [facility], [address], [place], [place] [address]  Lipase, blood     Status: None   Collection Time: 01/14/19  1:22 PM  Result Value Ref Range   Lipase 17 11 - 51 U/L    Comment: Performed at [facility], [address], [place], [place] [address]  Comprehensive metabolic panel     Status: Abnormal   Collection Time: 01/14/19  1:22 PM  Result Value Ref Range   Sodium 141 135 - 145 mmol/L   Potassium 3.8 3.5 - 5.1 mmol/L   Chloride 99 98 - 111 mmol/L   CO2 30 22 - 32 mmol/L   Glucose, Bld 174 (H) 70 - 99 mg/dL   BUN 23 8 - 23 mg/dL   Creatinine, Ser 0.83 0.61 - 1.24 mg/dL   Calcium 9.3 8.9 - 10.3 mg/dL   Total Protein 8.9 (H) 6.5 - 8.1 g/dL   Albumin  4.8 3.5 - 5.0 g/dL   AST 24 15 - 41 U/L   ALT 15 0 - 44 U/L   Alkaline Phosphatase 59 38 - 126 U/L   Total Bilirubin 0.6 0.3 - 1.2 mg/dL   GFR calc non Af Amer >60 >60 mL/min   GFR calc Af Amer >60 >60 mL/min   Anion gap 12 5 - 15    Comment: Performed at [facility], [address], [place], [place] [address]  CBC     Status: Abnormal   Collection Time: 01/14/19  1:22 PM  Result Value Ref Range   WBC 12.9 (H) 4.0 - 10.5 K/uL   RBC 4.56 4.22 - 5.81 MIL/uL   Hemoglobin 14.2 13.0 - 17.0 g/dL   HCT 43.8 39.0 - 52.0 %   MCV 96.1 80.0 - 100.0 fL   MCH 31.1 26.0 - 34.0 pg   MCHC 32.4 30.0 - 36.0 g/dL   RDW 13.3 11.5 - 15.5 %   Platelets 320 150 - 400 K/uL   nRBC 0.0 0.0 - 0.2 %    Comment: Performed at [facility], [address] [place], [place] [address]  CBG monitoring, ED     Status: Abnormal   Collection Time: 01/14/19  6:25 PM  Result Value Ref Range   Glucose-Capillary 143 (H) 70 - 99 mg/dL   Dg Chest 2 View  Result Date: 01/14/2019 CLINICAL DATA:  Acute shortness of breath. EXAM: CHEST - 2 VIEW COMPARISON:  07/28/2018 and prior radiographs FINDINGS: Cardiomegaly and increased vascularity again noted. Mild peribronchial thickening is unchanged. There is no evidence of focal airspace disease, pulmonary edema, suspicious pulmonary nodule/mass, pleural effusion, or pneumothorax. No acute bony abnormalities are identified. IMPRESSION: Cardiomegaly without evidence of acute cardiopulmonary disease. Increased pulmonary vascularity and mild chronic peribronchial thickening again noted. Electronically Signed   By: Margarette Canada M.D.   On: 01/14/2019 15:04   Ct Abdomen Pelvis W Contrast  Result Date: 01/14/2019 CLINICAL DATA:  Abdominal pain. EXAM: CT ABDOMEN AND PELVIS WITH CONTRAST TECHNIQUE: Multidetector CT imaging of the abdomen and pelvis was performed using the standard protocol following bolus administration of intravenous contrast. CONTRAST:  185m OMNIPAQUE IOHEXOL 300 MG/ML  SOLN  COMPARISON:  05/02/2015 FINDINGS: Lower chest: No acute abnormality. Similar appearance of multiple hyperdense lymph nodes within the mediastinum and hilar regions which may be the sequelae of prior granulomatous disease. Hepatobiliary: The caudate lobe and lateral segment of left lobe of liver appear enlarged. No focal liver abnormality identified. The gallbladder is normal. No biliary  dilatation. Pancreas: Unremarkable. No pancreatic ductal dilatation or surrounding inflammatory changes. Spleen: Normal in size without focal abnormality. Adrenals/Urinary Tract: Normal appearance of the adrenal glands. No kidney mass or hydronephrosis identified. The urinary bladder appears within normal limits. Stomach/Bowel: The stomach appears distended. The proximal small bowel loops are dilated measuring up to 4 cm in diameter. Fecalization of the dilated mid small bowel loops noted. Within the right upper quadrant of the abdomen there is a transition point to decreased caliber distal small bowel, image 34/5, and image 43/3. A large stool burden is identified throughout the colon which may reflect constipation. Vascular/Lymphatic: Aortic atherosclerosis without aneurysm. The portal vein appears patent. No adenopathy. Reproductive: Prostate is unremarkable. Other: No free fluid or fluid collection within the abdomen or pelvis. Musculoskeletal: Spondylosis within the thoracolumbar spine. No aggressive lytic or sclerotic bone lesions identified. IMPRESSION: 1. Small bowel obstruction. A transition point is identified within the right upper quadrant of the abdomen. Findings may reflect sequelae of adhesions. No mass identified. 2. Large stool burden within the colon which may reflect constipation. 3.  Aortic Atherosclerosis (ICD10-I70.0). 4. Morphologic features of the liver which may reflect early cirrhosis. Electronically Signed   By: Kerby Moors M.D.   On: 01/14/2019 15:05    Pending Labs Unresulted Labs (From admission,  onward)    Start     Ordered   01/21/19 0500  Creatinine, serum  (enoxaparin (LOVENOX)    CrCl >/= 30 ml/min)  Weekly,   R    Comments:  while on enoxaparin therapy    01/14/19 1819   01/15/19 0349  Basic metabolic panel  Tomorrow morning,   R     01/14/19 1819   01/15/19 0500  CBC  Tomorrow morning,   R     01/14/19 1819   01/14/19 1317  Carbon monoxide, blood (performed at ref lab)  Once,   R     01/14/19 1316          Vitals/Pain Today's Vitals   01/14/19 2030 01/14/19 2034 01/14/19 2125 01/14/19 2132  BP: 130/82  (!) 143/86   Pulse: 83  82   Resp:   16   Temp:   97.7 F (36.5 C)   TempSrc:      SpO2: 99%  98%   Weight:      Height:      PainSc:  9   Asleep    Isolation Precautions No active isolations  Medications Medications  aspirin EC tablet 162.5 mg (162.5 mg Oral Not Given 01/14/19 1840)  amLODipine (NORVASC) tablet 10 mg (10 mg Oral Not Given 01/14/19 1838)  cloNIDine (CATAPRES) tablet 0.2 mg (has no administration in time range)  losartan (COZAAR) tablet 100 mg ( Oral Not Given 01/14/19 1838)  metoprolol succinate (TOPROL-XL) 24 hr tablet 50 mg (50 mg Oral Given 01/14/19 1840)  insulin detemir (LEVEMIR) injection 20 Units (has no administration in time range)  latanoprost (XALATAN) 0.005 % ophthalmic solution 1 drop (has no administration in time range)  insulin aspart (novoLOG) injection 0-15 Units (2 Units Subcutaneous Given 01/14/19 1836)  enoxaparin (LOVENOX) injection 40 mg (40 mg Subcutaneous Given 01/14/19 1837)  promethazine (PHENERGAN) tablet 12.5 mg (has no administration in time range)  0.9 %  sodium chloride infusion ( Intravenous New Bag/Given 01/14/19 1834)  famotidine (PEPCID) IVPB 20 mg premix (has no administration in time range)  morphine 2 MG/ML injection 2 mg (2 mg Intravenous Given 01/14/19 2034)  sodium chloride flush (NS) 0.9 % injection  3 mL (3 mLs Intravenous Given 01/14/19 1324)  ondansetron (ZOFRAN) injection 4 mg (4 mg Intravenous Given  01/14/19 1324)  iohexol (OMNIPAQUE) 300 MG/ML solution 100 mL (100 mLs Intravenous Contrast Given 01/14/19 1422)    Mobility walks Low fall risk   Focused Assessments    R Recommendations: See Admitting Provider Note  Report given to:   Additional Notes:

## 2019-01-14 NOTE — ED Provider Notes (Signed)
[facility] EMERGENCY DEPARTMENT Provider Note   CSN: [id number] Arrival date & time: 01/14/19  1214    History   Chief Complaint Chief Complaint  Patient presents with  . Abdominal Pain    HPI Emmert Roethler. is a 68 y.o. male.     Patient came in with concern for some poisoning from a space heater.  He ran a new space heater Thursday evening into Friday morning.  Had a strong smell.  The patient particularly noticed it last night.  Patient started with right-sided abdominal pain more right upper quadrant.  Had some nausea but no vomiting no diarrhea.  No fevers.  Patient was concerned that he may have gotten poisoning because there was a very strong smell he thinks it is coming from the paint on the space heater.  He did today put the space heater outside but it had remained in the house.  Patient does not have any natural gas heating in the house.  His other heating is electric as well.  Patient normally on 2 L of oxygen at all times but he does use a concentrator.  Patient had prior abdominal surgery when he was a kid for appendicitis but none since then.     Past Medical History:  Diagnosis Date  . Allergic rhinitis   . Degenerative disk disease   . Essential hypertension   . H/O cardiovascular stress test 02/02/11   EF 59% - Normal stress test  . Knee pain, bilateral   . Lower back pain   . Morbid obesity ([place])   . OSA (obstructive sleep apnea)    On CPAP  . Type 2 diabetes mellitus [facility])     Patient Active Problem List   Diagnosis Date Noted  . Chronic respiratory failure with hypoxia ([place]) 11/12/2018  . AB (asthmatic bronchitis), moderate persistent, uncomplicated 57/32/2025  . Dyspnea on exertion 09/09/2016  . Acute bronchitis 10/01/2015  . Obesity hypoventilation syndrome ([place]) 12/21/2011  . Degenerative disc disease 12/21/2011  . DIABETES MELLITUS 06/26/2008  . MORBID OBESITY 06/25/2008  . Obstructive sleep apnea 06/25/2008  . HYPERTENSION 06/25/2008  .  ALLERGIC RHINITIS 06/25/2008  . BACK PAIN, CHRONIC 06/25/2008    Past Surgical History:  Procedure Laterality Date  . APPENDECTOMY    . Cataract surgery Bilateral   . KNEE ARTHROSCOPY Bilateral   . NASAL SEPTOPLASTY W/ TURBINOPLASTY    . RIGHT/LEFT HEART CATH AND CORONARY ANGIOGRAPHY N/A 06/23/2018   Procedure: RIGHT/LEFT HEART CATH AND CORONARY ANGIOGRAPHY;  Surgeon: Larey Dresser, MD;  Location: [place] [facility];  Service: Cardiovascular;  Laterality: N/A;        Home Medications    Prior to Admission medications   Medication Sig Start Date End Date Taking? Authorizing Provider  amLODipine (NORVASC) 10 MG tablet Take 1 tablet (10 mg total) by mouth daily. 06/15/18   Larey Dresser, MD  aspirin 81 MG tablet Take 2 tablets (162 mg total) by mouth daily. 06/14/18   Larey Dresser, MD  cloNIDine (CATAPRES) 0.2 MG tablet Take 0.2 mg by mouth 3 (three) times daily.      [provider]  furosemide (LASIX) 40 MG tablet Take 1 tablet (40 mg total) by mouth daily. 07/28/18 01/13/19  Larey Dresser, MD  glipiZIDE (GLUCOTROL) 10 MG tablet Take 10 mg by mouth 2 (two) times daily before a meal.      [provider]  HYDROcodone-acetaminophen (NORCO) 10-325 MG tablet Take 1 tablet by mouth 2 (two)  times daily as needed.     [provider]  insulin detemir (LEVEMIR) 100 UNIT/ML injection Inject 44 Units into the skin at bedtime.    [provider]  KRILL OIL PO Take 1 tablet by mouth daily.     [provider]  latanoprost (XALATAN) 0.005 % ophthalmic solution Place 1 drop into both eyes at bedtime.      [provider]  losartan (COZAAR) 100 MG tablet Take 100 mg by mouth daily.    [provider]  meclizine (ANTIVERT) 25 MG tablet Take 25 mg by mouth 3 (three) times daily as needed.    [provider]  meloxicam (MOBIC) 7.5 MG tablet Take 7.5 mg by mouth 2 (two) times daily as needed for pain.    [provider]  metFORMIN (GLUMETZA) 500 MG (MOD) 24 hr tablet Take 1,000 mg by mouth 2 (two) times daily.     [provider]  metoprolol (TOPROL-XL) 50 MG 24 hr tablet Take 50 mg by mouth daily.     [provider]  solifenacin (VESICARE) 10 MG tablet Take 10 mg by mouth daily.    [provider]  umeclidinium-vilanterol (ANORO ELLIPTA) 62.5-25 MCG/INH AEPB Inhale 1 puff into the lungs daily. 01/10/18   Deneise Lever, MD    Family History Family History  Problem Relation Age of Onset  . Lung disease Father   . Stroke Father 39  . Diabetes Father   . Diabetes Maternal Grandfather   . Heart disease Paternal Grandmother   . Stroke Paternal Grandfather 54  . Diabetes Paternal Grandfather   . Diabetes Mother   . Cancer Mother   . Stroke Mother 11  . Diabetes Sister   . Diabetes Brother 22    Social History Social History   Tobacco Use  . Smoking status: Former Research scientist (life sciences)  . Smokeless tobacco: Never Used  . Tobacco comment: smoked when he was a teenager  Substance Use Topics  . Alcohol use: No  . Drug use: No     Allergies   Sulfa antibiotics   Review of Systems Review of Systems  Constitutional: Positive for fatigue. Negative for chills and fever.  HENT: Negative for congestion, rhinorrhea and sore throat.   Eyes: Negative for visual disturbance.  Respiratory: Negative for cough and shortness of breath.   Cardiovascular: Negative for chest pain and leg swelling.  Gastrointestinal: Positive for abdominal pain and nausea. Negative for diarrhea and vomiting.  Genitourinary: Negative for dysuria.  Musculoskeletal: Negative for back pain and neck pain.  Skin: Negative for rash.  Neurological: Positive for weakness. Negative for dizziness, light-headedness and headaches.  Hematological: Does not bruise/bleed easily.  Psychiatric/Behavioral: Negative for confusion.     Physical Exam Updated Vital Signs BP (!) 158/91   Pulse 94   Temp 98.1 F (36.7 C)  (Oral)   Resp 18   Ht 1.702 m (_0 )   Wt 127.9 kg   SpO2 95%   BMI 44.17 kg/m   Physical Exam Vitals signs and nursing note reviewed.  Constitutional:      Appearance: He is well-developed.  HENT:     Head: Normocephalic and atraumatic.     Mouth/Throat:     Mouth: Mucous membranes are moist.  Eyes:     Extraocular Movements: Extraocular movements intact.     Conjunctiva/sclera: Conjunctivae normal.     Pupils: Pupils are equal, round, and reactive to light.  Neck:     Musculoskeletal: Neck  supple.  Cardiovascular:     Rate and Rhythm: Normal rate and regular rhythm.     Heart sounds: Normal heart sounds. No murmur.  Pulmonary:     Effort: Pulmonary effort is normal. No respiratory distress.     Breath sounds: Normal breath sounds. No wheezing.  Abdominal:     Palpations: Abdomen is soft.     Tenderness: There is abdominal tenderness.     Comments: Mild tenderness right upper quadrant no guarding  Musculoskeletal: Normal range of motion.  Skin:    General: Skin is warm and dry.  Neurological:     General: No focal deficit present.     Mental Status: He is alert and oriented to person, place, and time.      ED Treatments / Results  Labs (all labs ordered are listed, but only abnormal results are displayed) Labs Reviewed  COMPREHENSIVE METABOLIC PANEL - Abnormal; Notable for the following components:      Result Value   Glucose, Bld 174 (*)    Total Protein 8.9 (*)    All other components within normal limits  CBC - Abnormal; Notable for the following components:   WBC 12.9 (*)    All other components within normal limits  URINALYSIS, ROUTINE W REFLEX MICROSCOPIC - Abnormal; Notable for the following components:   Ketones, ur 20 (*)    Protein, ur 30 (*)    All other components within normal limits  LIPASE, BLOOD  CARBON MONOXIDE, BLOOD (PERFORMED AT REF LAB)    EKG None  Radiology Dg Chest 2 View  Result Date: 01/14/2019 CLINICAL DATA:  Acute  shortness of breath. EXAM: CHEST - 2 VIEW COMPARISON:  07/28/2018 and prior radiographs FINDINGS: Cardiomegaly and increased vascularity again noted. Mild peribronchial thickening is unchanged. There is no evidence of focal airspace disease, pulmonary edema, suspicious pulmonary nodule/mass, pleural effusion, or pneumothorax. No acute bony abnormalities are identified. IMPRESSION: Cardiomegaly without evidence of acute cardiopulmonary disease. Increased pulmonary vascularity and mild chronic peribronchial thickening again noted. Electronically Signed   By: Margarette Canada M.D.   On: 01/14/2019 15:04   Ct Abdomen Pelvis W Contrast  Result Date: 01/14/2019 CLINICAL DATA:  Abdominal pain. EXAM: CT ABDOMEN AND PELVIS WITH CONTRAST TECHNIQUE: Multidetector CT imaging of the abdomen and pelvis was performed using the standard protocol following bolus administration of intravenous contrast. CONTRAST:  172m OMNIPAQUE IOHEXOL 300 MG/ML  SOLN COMPARISON:  05/02/2015 FINDINGS: Lower chest: No acute abnormality. Similar appearance of multiple hyperdense lymph nodes within the mediastinum and hilar regions which may be the sequelae of prior granulomatous disease. Hepatobiliary: The caudate lobe and lateral segment of left lobe of liver appear enlarged. No focal liver abnormality identified. The gallbladder is normal. No biliary dilatation. Pancreas: Unremarkable. No pancreatic ductal dilatation or surrounding inflammatory changes. Spleen: Normal in size without focal abnormality. Adrenals/Urinary Tract: Normal appearance of the adrenal glands. No kidney mass or hydronephrosis identified. The urinary bladder appears within normal limits. Stomach/Bowel: The stomach appears distended. The proximal small bowel loops are dilated measuring up to 4 cm in diameter. Fecalization of the dilated mid small bowel loops noted. Within the right upper quadrant of the abdomen there is a transition point to decreased caliber distal small bowel,  image 34/5, and image 43/3. A large stool burden is identified throughout the colon which may reflect constipation. Vascular/Lymphatic: Aortic atherosclerosis without aneurysm. The portal vein appears patent. No adenopathy. Reproductive: Prostate is unremarkable. Other: No free fluid or fluid collection within the abdomen  or pelvis. Musculoskeletal: Spondylosis within the thoracolumbar spine. No aggressive lytic or sclerotic bone lesions identified. IMPRESSION: 1. Small bowel obstruction. A transition point is identified within the right upper quadrant of the abdomen. Findings may reflect sequelae of adhesions. No mass identified. 2. Large stool burden within the colon which may reflect constipation. 3.  Aortic Atherosclerosis (ICD10-I70.0). 4. Morphologic features of the liver which may reflect early cirrhosis. Electronically Signed   By: Kerby Moors M.D.   On: 01/14/2019 15:05    Procedures Procedures (including critical care time)  Medications Ordered in ED Medications  sodium chloride flush (NS) 0.9 % injection 3 mL (3 mLs Intravenous Given 01/14/19 1324)  ondansetron (ZOFRAN) injection 4 mg (4 mg Intravenous Given 01/14/19 1324)  iohexol (OMNIPAQUE) 300 MG/ML solution 100 mL (100 mLs Intravenous Contrast Given 01/14/19 1422)     Initial Impression / Assessment and Plan / ED Course  I have reviewed the triage vital signs and the nursing notes.  Pertinent labs & imaging results that were available during my care of the patient were reviewed by me and considered in my medical decision making (see chart for details).       Patient oxygen saturations on his 2 L are in the upper 90s.  Lungs are clear bilaterally.  Chest x-ray without acute findings.  Patient heater that put off the bed smell was electric.  Carbon monoxide unlikely based on the fact that there is no gas furnace or any gas worse in the house.  However carbon monoxide level sent because the patient has concerns for something being  given off by the pain.  With abdominal pain CT scan shows evidence of constipation as well as small bowel obstruction.  With an upper quadrant transition.  Discussed with Dr. Arnoldo Morale from general surgery he will see the patient in consultation he did not recommend NG tube because patient is only had nausea no vomiting.  Patient nontoxic no acute distress.  Discussed with hospitalist who will admit patient.  Patient's daughter instructed to sort air the house out and and make sure that the space heater is definitely outside.   Final Clinical Impressions(s) / ED Diagnoses   Final diagnoses:  SBO (small bowel obstruction) [facility])    ED Discharge Orders    None       Fredia Sorrow, MD 01/14/19 1654

## 2019-01-14 NOTE — ED Triage Notes (Signed)
Patient c/o lower abd pain and bilateral flank pain that started this morning. Patient reports nausea without vomiting. Denies an diarrhea, fevers, or urinary. Per patient recently bought a space heater and plugged it in last night, per patient fumes filled the air and that "is all he can smell." Patient thinks pain and nausea is related.

## 2019-01-15 LAB — GLUCOSE, CAPILLARY
GLUCOSE-CAPILLARY: 118 mg/dL — AB (ref 70–99)
GLUCOSE-CAPILLARY: 137 mg/dL — AB (ref 70–99)
Glucose-Capillary: 161 mg/dL — ABNORMAL HIGH (ref 70–99)
Glucose-Capillary: 157 mg/dL — ABNORMAL HIGH (ref 70–99)
Glucose-Capillary: 174 mg/dL — ABNORMAL HIGH (ref 70–99)

## 2019-01-15 LAB — CBC
HCT: 42.2 % (ref 39.0–52.0)
HEMOGLOBIN: 13.7 g/dL (ref 13.0–17.0)
MCH: 31.3 pg (ref 26.0–34.0)
MCHC: 32.5 g/dL (ref 30.0–36.0)
MCV: 96.3 fL (ref 80.0–100.0)
Platelets: 319 10*3/uL (ref 150–400)
RBC: 4.38 MIL/uL (ref 4.22–5.81)
RDW: 13.2 % (ref 11.5–15.5)
WBC: 9.2 10*3/uL (ref 4.0–10.5)
nRBC: 0 % (ref 0.0–0.2)

## 2019-01-15 LAB — BASIC METABOLIC PANEL
Anion gap: 11 (ref 5–15)
BUN: 18 mg/dL (ref 8–23)
CO2: 26 mmol/L (ref 22–32)
Calcium: 9.2 mg/dL (ref 8.9–10.3)
Chloride: 103 mmol/L (ref 98–111)
Creatinine, Ser: 0.75 mg/dL (ref 0.61–1.24)
GFR calc Af Amer: >60 mL/min (ref >60)
GFR calc non Af Amer: >60 mL/min (ref >60)
Glucose, Bld: 150 mg/dL — ABNORMAL HIGH (ref 70–99)
Potassium: 3.8 mmol/L (ref 3.5–5.1)
Sodium: 140 mmol/L (ref 135–145)

## 2019-01-15 MED ORDER — MILK AND MOLASSES ENEMA
1.0000 | Freq: Once | RECTAL | Status: AC
  Administered 2019-01-15: 250 mL via RECTAL

## 2019-01-15 MED ORDER — ASPIRIN EC 81 MG PO TBEC
162.0000 mg | DELAYED_RELEASE_TABLET | Freq: Every day | ORAL | Status: DC
  Administered 2019-01-15 – 2019-01-19 (×4): 162 mg via ORAL
  Filled 2019-01-15 (×5): qty 2

## 2019-01-15 NOTE — Progress Notes (Signed)
PCP: Dr. Hilma Favors Cardiology: Dr. Domenic Polite HF Cardiology: Dr. Aundra Dubin  68 y.o. with history of OHS/OSA, chronic bronchitis, and suspected pulmonary hypertension was referred by Dr. Domenic Polite for evaluation of pulmonary hypertension. Patient is on 2L home oxygen and CPAP. He has had gradually worsening exertional dyspnea over the last year.  He was able to mow his grass last fall but cannot now.  He is short of breath after walking about 50-60 feet with his oxygen on.  He is short of breath with any incline.   Echo in 3/19 showed normal EF with PASP 72 mmHg.  I had him do left/right heart cath in 8/19.  This showed no CAD and moderate mixed pulmonary venous/pulmonary arterial hypertension.  Very mildly elevated filling pressures.    He is followed by pulmonary.  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 V/Q scan in 9/19 did not show evidence for chronic PE.   He returns today for followup of pulmonary hypertension, diastolic CHF.  He has lost 17 lbs since last appointment with dietary changes.  He feels like his breathing is better.  No dyspnea for short distances on flat ground.  Dyspnea with stairs and inclines.  No chest pain.  No orthopnea/PND. He does not left calf pain with walking.   ECG: NSR  Labs (11/18): BNP 21 Labs (7/19): LDL 79 Labs (8/19): K 3.9, creatinine 0.73 Labs (9/19): ACE normal, K 3.8, creatinine 1.13  PMH: 1. OHS/OSA: Uses 2L home oxygen and CPAP.  2. Obesity 3. HTN 4. Type II diabetes 5. Chronic bronchitis: ?from welding.  Never smoked.    - CT chest (3/19): coronary calcification, mild emphysema, calcified mediastinal and hilar nodes consistent with prior granulomatous infection.  - PFTs (12/17): FVC 56%, FEV1 57%, ratio 101%, SVC 109%, DLCO 66%.  Moderate obstruction/severe restriction.  6. CAD: Coronary calcification on CT chest 3/19.   - Cardiolite in 2012 showed no ischemia.  - LHC  (8/19): No significant CAD.  7. Pulmonary hypertension: Echo (3/19) with EF 65-70%, moderate LVH, PASP 72 mmHg.  - RHC (8/19): mean RA 8, PA 67/24 mean 43, mean PCWP 17, CI 2.99, PVR 3.5 WU.  - V/Q scan (9/19): No evidence for chronic PE.   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 Lives in [place].   Family History  Problem Relation Age of Onset  . Lung disease Father   . Stroke Father 94  . Diabetes Father   . Diabetes Maternal Grandfather   . Heart disease Paternal Grandmother   . Stroke Paternal Grandfather 107  . Diabetes Paternal Grandfather   . Diabetes Mother   . Cancer Mother   . Stroke Mother 64  . Diabetes Sister   . Diabetes Brother 36   ROS: All systems reviewed and negative except as per HPI.   No current facility-administered medications for this encounter.    No current outpatient medications on file.   Facility-Administered Medications Ordered in Other Encounters  Medication Dose Route Frequency Provider Last Rate Last Dose  . amLODipine (NORVASC) tablet 10 mg  10 mg Oral Daily Truett Mainland, DO   10 mg at 01/15/19 0827  . aspirin EC tablet 162 mg  162 mg Oral Daily Manuella Ghazi, Pratik D, DO   162 mg at 01/15/19 1515  . cloNIDine (CATAPRES) tablet 0.2 mg  0.2 mg Oral TID Truett Mainland, DO   0.2 mg  at 01/15/19 2100  . enoxaparin (LOVENOX) injection 65 mg  0.5 mg/kg Subcutaneous Q24H Truett Mainland, DO   65 mg at 01/15/19 1133  . famotidine (PEPCID) IVPB 20 mg premix  20 mg Intravenous Q12H Truett Mainland, DO   Stopped at 01/15/19 1707  . insulin aspart (novoLOG) injection 0-15 Units  0-15 Units Subcutaneous Q6H Truett Mainland, DO   3 Units at 01/15/19 1711  . insulin detemir (LEVEMIR) injection 20 Units  20 Units Subcutaneous Daily PRN Truett Mainland, DO      . latanoprost (XALATAN) 0.005 % ophthalmic solution 1 drop  1 drop Both Eyes QHS Truett Mainland, DO   1 drop at 01/14/19 2345  . losartan (COZAAR) tablet 100 mg  100 mg Oral  Daily Truett Mainland, DO   100 mg at 01/15/19 0827  . metoprolol succinate (TOPROL-XL) 24 hr tablet 50 mg  50 mg Oral Daily Truett Mainland, DO   50 mg at 01/15/19 1610  . morphine 2 MG/ML injection 2 mg  2 mg Intravenous Q4H PRN Truett Mainland, DO   2 mg at 01/15/19 2012  . promethazine (PHENERGAN) tablet 12.5 mg  12.5 mg Oral Q6H PRN Truett Mainland, DO       BP 130/63   Pulse 91   Wt 128.2 kg (282 lb 9.6 oz)   SpO2 94%   BMI 42.97 kg/m  General: NAD Neck: JVP 8 cm, no thyromegaly or thyroid nodule.  Lungs: Clear to auscultation bilaterally with normal respiratory effort. CV: Nondisplaced PMI.  Heart regular S1/S2, no S3/S4, no murmur.  Wearing compression stockings.  No carotid bruit.  Unable to palpate pedal pulses.  Abdomen: Soft, nontender, no hepatosplenomegaly, no distention.  Skin: Intact without lesions or rashes.  Neurologic: Alert and oriented x 3.  Psych: Normal affect. Extremities: No clubbing or cyanosis.  HEENT: Normal.   Assessment/Plan: 1. Pulmonary hypertension: Patient has NYHA class IIIb dyspnea.  Echo in 3/19 showed estimated PA systolic pressure 72.  He has chronic bronchitis as well as OHS/OSA.  Interestingly, chest CT showed calcified mediastinal and hilar nodes suggestive of prior granulomatous infection => could this actually represent sarcoidosis?  ACE level is normal, and per his pulmonologist [name], sarcoidosis is unlikely. RHC showed mild pulmonary hypertension, mildly elevated PCWP.  Suspect there group 2 PH from mildly elevated PCWP but also group 3 PH from OHS/OSA and parenchymal lung disease.  Unlikely group 1.  PVR only 3.5 WU.  He does not looks volume overloaded today and weight is down.  - Continue Lasix 40 mg daily , BMET today.  2. CAD: Coronary calcification on 3/19 chest CT.  Patient has a history of chest pain.  LHC (8/19) showed no significant coronary disease.  Suspect non-ischemic chest pain.   - Continue ASA 81.  - LDL was 79 when  last checked, will hold off on starting statin.  3. HTN: BP controlled.  4. OHS/OSA: Continue oxygen 2L during day and CPAP at night.  5. ?PAD: Possible left calf claudication. I will get peripheral arterial doppler evaluation.   Followup with APP in 3 months.   Loralie Champagne 01/15/2019

## 2019-01-15 NOTE — Consult Note (Signed)
Reason for Consult: Small bowel obstruction Referring Physician: Dr. Rolene Course Aristides Luckey. is an 68 y.o. male.  HPI: Patient is a 68 year old black male with a history of diabetes mellitus, morbid obesity, and sleep apnea with CPAP who states he started using an electric heater and inhaled some noxious fumes.  He did become nauseated and he tried to make himself throw up, but he could not.  He states he chronically has constipation.  He denies any fever or chills.  In the emergency room, CT scan of the abdomen revealed a bowel obstruction possibly secondary to adhesive disease in the right portion of his abdomen, colon full of stool.  He was admitted overnight for further evaluation treatment.  This morning, he says that his abdominal pain has resolved.  He has the sensation that he needs to move his bowels.  He is passing some flatus.  He currently denies any significant abdominal pain.  Past Medical History:  Diagnosis Date  . Allergic rhinitis   . Degenerative disk disease   . Essential hypertension   . H/O cardiovascular stress test 02/02/11   EF 59% - Normal stress test  . Knee pain, bilateral   . Lower back pain   . Morbid obesity ([place])   . OSA (obstructive sleep apnea)    On CPAP  . Type 2 diabetes mellitus ([place])     Past Surgical History:  Procedure Laterality Date  . APPENDECTOMY    . Cataract surgery Bilateral   . KNEE ARTHROSCOPY Bilateral   . NASAL SEPTOPLASTY W/ TURBINOPLASTY    . RIGHT/LEFT HEART CATH AND CORONARY ANGIOGRAPHY N/A 06/23/2018   Procedure: RIGHT/LEFT HEART CATH AND CORONARY ANGIOGRAPHY;  Surgeon: Larey Dresser, MD;  Location: [facility];  Service: Cardiovascular;  Laterality: N/A;    Family History  Problem Relation Age of Onset  . Lung disease Father   . Stroke Father 44  . Diabetes Father   . Diabetes Maternal Grandfather   . Heart disease Paternal Grandmother   . Stroke Paternal Grandfather 22  . Diabetes Paternal Grandfather   .  Diabetes Mother   . Cancer Mother   . Stroke Mother 17  . Diabetes Sister   . Diabetes Brother 86    Social History:  reports that he has quit smoking. He has never used smokeless tobacco. He reports that he does not drink alcohol or use drugs.  Allergies:  Allergies  Allergen Reactions  . Sulfa Antibiotics Swelling    eyelids    Medications: I have reviewed the patient's current medications.  Results for orders placed or performed during the hospital encounter of 01/14/19 (from the past 48 hour(s))  Urinalysis, Routine w reflex microscopic     Status: Abnormal   Collection Time: 01/14/19 12:55 PM  Result Value Ref Range   Color, Urine YELLOW YELLOW   APPearance CLEAR CLEAR   Specific Gravity, Urine 1.018 1.005 - 1.030   pH 7.0 5.0 - 8.0   Glucose, UA NEGATIVE NEGATIVE mg/dL   Hgb urine dipstick NEGATIVE NEGATIVE   Bilirubin Urine NEGATIVE NEGATIVE   Ketones, ur 20 (A) NEGATIVE mg/dL   Protein, ur 30 (A) NEGATIVE mg/dL   Nitrite NEGATIVE NEGATIVE   Leukocytes,Ua NEGATIVE NEGATIVE   RBC / HPF 0-5 0 - 5 RBC/hpf   WBC, UA 0-5 0 - 5 WBC/hpf   Bacteria, UA NONE SEEN NONE SEEN    Comment: Performed at [facility], [address], [place], [place] [address]  Lipase,  blood     Status: None   Collection Time: 01/14/19  1:22 PM  Result Value Ref Range   Lipase 17 11 - 51 U/L    Comment: Performed at [facility], [address] [place], [place] [address]  Comprehensive metabolic panel     Status: Abnormal   Collection Time: 01/14/19  1:22 PM  Result Value Ref Range   Sodium 141 135 - 145 mmol/L   Potassium 3.8 3.5 - 5.1 mmol/L   Chloride 99 98 - 111 mmol/L   CO2 30 22 - 32 mmol/L   Glucose, Bld 174 (H) 70 - 99 mg/dL   BUN 23 8 - 23 mg/dL   Creatinine, Ser 0.83 0.61 - 1.24 mg/dL   Calcium 9.3 8.9 - 10.3 mg/dL   Total Protein 8.9 (H) 6.5 - 8.1 g/dL   Albumin 4.8 3.5 - 5.0 g/dL   AST 24 15 - 41 U/L   ALT 15 0 - 44 U/L   Alkaline Phosphatase 59 38 - 126 U/L   Total  Bilirubin 0.6 0.3 - 1.2 mg/dL   GFR calc non Af Amer >60 >60 mL/min   GFR calc Af Amer >60 >60 mL/min   Anion gap 12 5 - 15    Comment: Performed at [facility], [address] [place], [place] [address]  CBC     Status: Abnormal   Collection Time: 01/14/19  1:22 PM  Result Value Ref Range   WBC 12.9 (H) 4.0 - 10.5 K/uL   RBC 4.56 4.22 - 5.81 MIL/uL   Hemoglobin 14.2 13.0 - 17.0 g/dL   HCT 43.8 39.0 - 52.0 %   MCV 96.1 80.0 - 100.0 fL   MCH 31.1 26.0 - 34.0 pg   MCHC 32.4 30.0 - 36.0 g/dL   RDW 13.3 11.5 - 15.5 %   Platelets 320 150 - 400 K/uL   nRBC 0.0 0.0 - 0.2 %    Comment: Performed at [facility], [address], [place], [place] [address]  CBG monitoring, ED     Status: Abnormal   Collection Time: 01/14/19  6:25 PM  Result Value Ref Range   Glucose-Capillary 143 (H) 70 - 99 mg/dL  Glucose, capillary     Status: Abnormal   Collection Time: 01/15/19 12:31 AM  Result Value Ref Range   Glucose-Capillary 118 (H) 70 - 99 mg/dL   Comment 1 Notify RN    Comment 2 Document in Chart   Glucose, capillary     Status: Abnormal   Collection Time: 01/15/19  6:39 AM  Result Value Ref Range   Glucose-Capillary 137 (H) 70 - 99 mg/dL   Comment 1 Notify RN    Comment 2 Document in Chart   Basic metabolic panel     Status: Abnormal   Collection Time: 01/15/19  7:22 AM  Result Value Ref Range   Sodium 140 135 - 145 mmol/L   Potassium 3.8 3.5 - 5.1 mmol/L   Chloride 103 98 - 111 mmol/L   CO2 26 22 - 32 mmol/L   Glucose, Bld 150 (H) 70 - 99 mg/dL   BUN 18 8 - 23 mg/dL   Creatinine, Ser 0.75 0.61 - 1.24 mg/dL   Calcium 9.2 8.9 - 10.3 mg/dL   GFR calc non Af Amer >60 >60 mL/min   GFR calc Af Amer >60 >60 mL/min   Anion gap 11 5 - 15    Comment: Performed at [facility], [address], [place], [place] [address]  CBC     Status: None   Collection Time: 01/15/19  7:22 AM  Result Value Ref Range   WBC 9.2 4.0 - 10.5 K/uL   RBC 4.38 4.22 - 5.81 MIL/uL   Hemoglobin 13.7 13.0 - 17.0  g/dL   HCT 42.2 39.0 - 52.0 %   MCV 96.3 80.0 - 100.0 fL   MCH 31.3 26.0 - 34.0 pg   MCHC 32.5 30.0 - 36.0 g/dL   RDW 13.2 11.5 - 15.5 %   Platelets 319 150 - 400 K/uL   nRBC 0.0 0.0 - 0.2 %    Comment: Performed at [facility], [address] [place], [place] [address]    Dg Chest 2 View  Result Date: 01/14/2019 CLINICAL DATA:  Acute shortness of breath. EXAM: CHEST - 2 VIEW COMPARISON:  07/28/2018 and prior radiographs FINDINGS: Cardiomegaly and increased vascularity again noted. Mild peribronchial thickening is unchanged. There is no evidence of focal airspace disease, pulmonary edema, suspicious pulmonary nodule/mass, pleural effusion, or pneumothorax. No acute bony abnormalities are identified. IMPRESSION: Cardiomegaly without evidence of acute cardiopulmonary disease. Increased pulmonary vascularity and mild chronic peribronchial thickening again noted. Electronically Signed   By: Margarette Canada M.D.   On: 01/14/2019 15:04   Ct Abdomen Pelvis W Contrast  Result Date: 01/14/2019 CLINICAL DATA:  Abdominal pain. EXAM: CT ABDOMEN AND PELVIS WITH CONTRAST TECHNIQUE: Multidetector CT imaging of the abdomen and pelvis was performed using the standard protocol following bolus administration of intravenous contrast. CONTRAST:  134m OMNIPAQUE IOHEXOL 300 MG/ML  SOLN COMPARISON:  05/02/2015 FINDINGS: Lower chest: No acute abnormality. Similar appearance of multiple hyperdense lymph nodes within the mediastinum and hilar regions which may be the sequelae of prior granulomatous disease. Hepatobiliary: The caudate lobe and lateral segment of left lobe of liver appear enlarged. No focal liver abnormality identified. The gallbladder is normal. No biliary dilatation. Pancreas: Unremarkable. No pancreatic ductal dilatation or surrounding inflammatory changes. Spleen: Normal in size without focal abnormality. Adrenals/Urinary Tract: Normal appearance of the adrenal glands. No kidney mass or hydronephrosis  identified. The urinary bladder appears within normal limits. Stomach/Bowel: The stomach appears distended. The proximal small bowel loops are dilated measuring up to 4 cm in diameter. Fecalization of the dilated mid small bowel loops noted. Within the right upper quadrant of the abdomen there is a transition point to decreased caliber distal small bowel, image 34/5, and image 43/3. A large stool burden is identified throughout the colon which may reflect constipation. Vascular/Lymphatic: Aortic atherosclerosis without aneurysm. The portal vein appears patent. No adenopathy. Reproductive: Prostate is unremarkable. Other: No free fluid or fluid collection within the abdomen or pelvis. Musculoskeletal: Spondylosis within the thoracolumbar spine. No aggressive lytic or sclerotic bone lesions identified. IMPRESSION: 1. Small bowel obstruction. A transition point is identified within the right upper quadrant of the abdomen. Findings may reflect sequelae of adhesions. No mass identified. 2. Large stool burden within the colon which may reflect constipation. 3.  Aortic Atherosclerosis (ICD10-I70.0). 4. Morphologic features of the liver which may reflect early cirrhosis. Electronically Signed   By: TKerby MoorsM.D.   On: 01/14/2019 15:05    ROS:  Pertinent items are noted in HPI.  Blood pressure (!) 156/91, pulse 85, temperature 98.3 F (36.8 C), temperature source Oral, resp. rate 20, height _0  (1.702 m), weight 125.2 kg, SpO2 100 %. Physical Exam: Morbidly obese black male no acute distress Head is normocephalic, atraumatic Lungs clear to auscultation with good breath sounds bilaterally Heart examination  reveals a regular rate and rhythm without S3, S4, murmurs Abdomen is soft and nontender.  No rigidity is noted.  Examination limited due to body habitus.  CT scan images personally reviewed  Assessment/Plan: Impression: Partial bowel obstruction with significant constipation.  Clinically, the  patient does not exhibit evidence of a complete bowel obstruction.  He is constipated. Plan: Okay for clear liquid diet.  Will give molasses enema.  Further management is pending those results.  No need for acute surgical intervention at this time.  Aviva Signs 01/15/2019, 10:31 AM

## 2019-01-15 NOTE — Progress Notes (Signed)
PROGRESS NOTE    Colton Jackson.  HRC:[id number] DOB: [birth year] DOA: 01/14/2019 PCP: Sharilyn Sites, MD   Brief Narrative:  Per HPI: Colton Jackson. is a 68 y.o. male with a history of hypertension, diabetes, chronic back pain, chronic respiratory failure on 2 L nasal cannula at home, obesity hypoventilatory syndrome, obstructive sleep apnea with CPAP use at night.  Patient seen for abdominal pain that started earlier this morning, along with nausea pain located in the right upper quadrant.  No fluctuation to the patient's pain: No improvement or worsening.  Pain is fairly constant rated 5 out of 10.  No other palliating or provoking factors.  He has had longstanding history of constipation over 8 to 9 years.  His last bowel movement was approximately a week ago.  Patient was admitted with concerns for small bowel obstruction noted on imaging, but appears to be passing flatus.  There is also some concern for possible carbon monoxide poisoning, but patient is not exhibiting any acute symptomatology.  Assessment & Plan:   Principal Problem:   SBO (small bowel obstruction) (HCC) Active Problems:   Type 2 diabetes mellitus without complication (HCC)   Essential hypertension   Obesity hypoventilation syndrome (HCC)   Chronic respiratory failure with hypoxia ([place])   1. Partial small bowel obstruction.  Appreciate general surgery recommendations with molasses enema today as well as clear liquid diet and advance as tolerated.  Does not appear to need any aggressive intervention at this time. 2. Type 2 diabetes.  Blood glucose is well controlled.  Will advance oral home agents when tolerating diet. 3. Hypertension.  Continue antihypertensives and monitor blood pressure. 4. Obesity hypoventilation syndrome.  Maintain on CPAP at night. 5. Chronic respiratory failure with hypoxemia.  Will maintain on home oxygen.   DVT prophylaxis: Lovenox Code Status: Full Family Communication: None at  bedside Disposition Plan: Advance diet as tolerated and proceed with enema   Consultants:   General surgery  Procedures:   None  Antimicrobials:   None   Subjective: Patient seen and evaluated today with no new acute complaints or concerns. No acute concerns or events noted overnight.  He has been passing flatus and denies any significant abdominal pain, nausea, or vomiting.  He is ready to try clear liquid diet.  Objective: Vitals:   01/14/19 2130 01/14/19 2247 01/14/19 2249 01/15/19 0520  BP: (!) 141/80  139/86 (!) 156/91  Pulse: 84 85 85 85  Resp:   16 20  Temp:   (!) 97.5 F (36.4 C) 98.3 F (36.8 C)  TempSrc:   Oral Oral  SpO2: 98% 98% 98% 100%  Weight:   125.2 kg   Height:   _0  (1.702 m)     Intake/Output Summary (Last 24 hours) at 01/15/2019 1136 Last data filed at 01/15/2019 0900 Gross per 24 hour  Intake 724.76 ml  Output 250 ml  Net 474.76 ml   Filed Weights   01/14/19 1253 01/14/19 2249  Weight: 127.9 kg 125.2 kg    Examination:  General exam: Appears calm and comfortable  Respiratory system: Clear to auscultation. Respiratory effort normal.  Currently wearing CPAP mask. Cardiovascular system: S1 & S2 heard, RRR. No JVD, murmurs, rubs, gallops or clicks. No pedal edema. Gastrointestinal system: Abdomen is nondistended, soft and nontender. No organomegaly or masses felt. Normal bowel sounds heard. Central nervous system: Alert and oriented. No focal neurological deficits. Extremities: Symmetric 5 x 5 power. Skin: No rashes, lesions or ulcers Psychiatry: Judgement and insight  appear normal. Mood & affect appropriate.     Data Reviewed: I have personally reviewed following labs and imaging studies  CBC: Recent Labs  Lab 01/14/19 1322 01/15/19 0722  WBC 12.9* 9.2  HGB 14.2 13.7  HCT 43.8 42.2  MCV 96.1 96.3  PLT 320 883   Basic Metabolic Panel: Recent Labs  Lab 01/13/19 1523 01/14/19 1322 01/15/19 0722  NA 139 141 140  K 3.8 3.8  3.8  CL 102 99 103  CO2 _0 GLUCOSE 141* 174* 150*  BUN _1 CREATININE 0.95 0.83 0.75  CALCIUM 9.4 9.3 9.2   GFR: Estimated Creatinine Clearance: 115.2 mL/min (by C-G formula based on SCr of 0.75 mg/dL). Liver Function Tests: Recent Labs  Lab 01/14/19 1322  AST 24  ALT 15  ALKPHOS 59  BILITOT 0.6  PROT 8.9*  ALBUMIN 4.8   Recent Labs  Lab 01/14/19 1322  LIPASE 17   No results for input(s): AMMONIA in the last 168 hours. Coagulation Profile: No results for input(s): INR, PROTIME in the last 168 hours. Cardiac Enzymes: No results for input(s): CKTOTAL, CKMB, CKMBINDEX, TROPONINI in the last 168 hours. BNP (last 3 results) No results for input(s): PROBNP in the last 8760 hours. HbA1C: No results for input(s): HGBA1C in the last 72 hours. CBG: Recent Labs  Lab 01/14/19 1825 01/15/19 0031 01/15/19 0639  GLUCAP 143* 118* 137*   Lipid Profile: No results for input(s): CHOL, HDL, LDLCALC, TRIG, CHOLHDL, LDLDIRECT in the last 72 hours. Thyroid Function Tests: No results for input(s): TSH, T4TOTAL, FREET4, T3FREE, THYROIDAB in the last 72 hours. Anemia Panel: No results for input(s): VITAMINB12, FOLATE, FERRITIN, TIBC, IRON, RETICCTPCT in the last 72 hours. Sepsis Labs: No results for input(s): PROCALCITON, LATICACIDVEN in the last 168 hours.  No results found for this or any previous visit (from the past 240 hour(s)).       Radiology Studies: Dg Chest 2 View  Result Date: 01/14/2019 CLINICAL DATA:  Acute shortness of breath. EXAM: CHEST - 2 VIEW COMPARISON:  07/28/2018 and prior radiographs FINDINGS: Cardiomegaly and increased vascularity again noted. Mild peribronchial thickening is unchanged. There is no evidence of focal airspace disease, pulmonary edema, suspicious pulmonary nodule/mass, pleural effusion, or pneumothorax. No acute bony abnormalities are identified. IMPRESSION: Cardiomegaly without evidence of acute cardiopulmonary disease. Increased  pulmonary vascularity and mild chronic peribronchial thickening again noted. Electronically Signed   By: Margarette Canada M.D.   On: 01/14/2019 15:04   Ct Abdomen Pelvis W Contrast  Result Date: 01/14/2019 CLINICAL DATA:  Abdominal pain. EXAM: CT ABDOMEN AND PELVIS WITH CONTRAST TECHNIQUE: Multidetector CT imaging of the abdomen and pelvis was performed using the standard protocol following bolus administration of intravenous contrast. CONTRAST:  167m OMNIPAQUE IOHEXOL 300 MG/ML  SOLN COMPARISON:  05/02/2015 FINDINGS: Lower chest: No acute abnormality. Similar appearance of multiple hyperdense lymph nodes within the mediastinum and hilar regions which may be the sequelae of prior granulomatous disease. Hepatobiliary: The caudate lobe and lateral segment of left lobe of liver appear enlarged. No focal liver abnormality identified. The gallbladder is normal. No biliary dilatation. Pancreas: Unremarkable. No pancreatic ductal dilatation or surrounding inflammatory changes. Spleen: Normal in size without focal abnormality. Adrenals/Urinary Tract: Normal appearance of the adrenal glands. No kidney mass or hydronephrosis identified. The urinary bladder appears within normal limits. Stomach/Bowel: The stomach appears distended. The proximal small bowel loops are dilated measuring up to 4 cm in diameter. Fecalization of the dilated mid small bowel  loops noted. Within the right upper quadrant of the abdomen there is a transition point to decreased caliber distal small bowel, image 34/5, and image 43/3. A large stool burden is identified throughout the colon which may reflect constipation. Vascular/Lymphatic: Aortic atherosclerosis without aneurysm. The portal vein appears patent. No adenopathy. Reproductive: Prostate is unremarkable. Other: No free fluid or fluid collection within the abdomen or pelvis. Musculoskeletal: Spondylosis within the thoracolumbar spine. No aggressive lytic or sclerotic bone lesions identified.  IMPRESSION: 1. Small bowel obstruction. A transition point is identified within the right upper quadrant of the abdomen. Findings may reflect sequelae of adhesions. No mass identified. 2. Large stool burden within the colon which may reflect constipation. 3.  Aortic Atherosclerosis (ICD10-I70.0). 4. Morphologic features of the liver which may reflect early cirrhosis. Electronically Signed   By: Kerby Moors M.D.   On: 01/14/2019 15:05        Scheduled Meds: . amLODipine  10 mg Oral Daily  . aspirin  162.5 mg Oral Daily  . cloNIDine  0.2 mg Oral TID  . enoxaparin (LOVENOX) injection  0.5 mg/kg Subcutaneous Q24H  . insulin aspart  0-15 Units Subcutaneous Q6H  . latanoprost  1 drop Both Eyes QHS  . losartan  100 mg Oral Daily  . metoprolol succinate  50 mg Oral Daily   Continuous Infusions: . sodium chloride 125 mL/hr at 01/14/19 2338  . famotidine (PEPCID) IV 20 mg (01/15/19 0829)     LOS: 1 day    Time spent: 30 minutes    Mckale Haffey Darleen Crocker, DO [facility] Pager [phone]  If 7PM-7AM, please contact night-coverage [email or web address] Password [email or web address] 01/15/2019, 11:36 AM

## 2019-01-16 LAB — GLUCOSE, CAPILLARY
GLUCOSE-CAPILLARY: 146 mg/dL — AB (ref 70–99)
Glucose-Capillary: 141 mg/dL — ABNORMAL HIGH (ref 70–99)
Glucose-Capillary: 142 mg/dL — ABNORMAL HIGH (ref 70–99)
Glucose-Capillary: 151 mg/dL — ABNORMAL HIGH (ref 70–99)
Glucose-Capillary: 157 mg/dL — ABNORMAL HIGH (ref 70–99)

## 2019-01-16 LAB — CARBON MONOXIDE, BLOOD (PERFORMED AT REF LAB): Carbon Monoxide, Blood: 3.3 % (ref 0.0–3.6)

## 2019-01-16 MED ORDER — HYDRALAZINE HCL 20 MG/ML IJ SOLN
2.0000 mg | Freq: Four times a day (QID) | INTRAMUSCULAR | Status: DC
  Administered 2019-01-16 – 2019-01-17 (×4): 2 mg via INTRAVENOUS
  Filled 2019-01-16 (×4): qty 1

## 2019-01-16 MED ORDER — METOPROLOL TARTRATE 5 MG/5ML IV SOLN
5.0000 mg | Freq: Two times a day (BID) | INTRAVENOUS | Status: DC
  Administered 2019-01-16 – 2019-01-17 (×3): 5 mg via INTRAVENOUS
  Filled 2019-01-16 (×3): qty 5

## 2019-01-16 MED ORDER — MILK AND MOLASSES ENEMA
1.0000 | Freq: Once | RECTAL | Status: AC
  Administered 2019-01-16: 250 mL via RECTAL

## 2019-01-16 MED ORDER — ONDANSETRON HCL 4 MG/2ML IJ SOLN: 4.0000 mg | Freq: Four times a day (QID) | INTRAMUSCULAR | Status: DC | PRN

## 2019-01-16 MED ORDER — HYDRALAZINE HCL 20 MG/ML IJ SOLN: 10.0000 mg | INTRAMUSCULAR | Status: DC | PRN

## 2019-01-16 MED ORDER — HYDRALAZINE HCL 20 MG/ML IJ SOLN: 5.0000 mg | Freq: Four times a day (QID) | INTRAMUSCULAR | Status: DC

## 2019-01-16 MED ORDER — POLYETHYLENE GLYCOL 3350 17 G PO PACK
17.0000 g | PACK | Freq: Two times a day (BID) | ORAL | Status: DC
  Administered 2019-01-16 – 2019-01-18 (×4): 17 g via ORAL
  Filled 2019-01-16 (×6): qty 1

## 2019-01-16 MED ORDER — FLEET ENEMA 7-19 GM/118ML RE ENEM
1.0000 | ENEMA | Freq: Once | RECTAL | Status: AC
  Administered 2019-01-16: 1 via RECTAL

## 2019-01-16 MED ORDER — BISACODYL 10 MG RE SUPP
10.0000 mg | Freq: Two times a day (BID) | RECTAL | Status: DC
  Administered 2019-01-16 (×2): 10 mg via RECTAL
  Filled 2019-01-16: qty 1

## 2019-01-16 MED ORDER — CLONIDINE HCL 0.2 MG/24HR TD PTWK
0.2000 mg | MEDICATED_PATCH | TRANSDERMAL | Status: DC
  Administered 2019-01-16: 0.2 mg via TRANSDERMAL
  Filled 2019-01-16: qty 1

## 2019-01-16 NOTE — Progress Notes (Addendum)
Initial Nutrition Assessment  DOCUMENTATION CODES:   Morbid obesity  INTERVENTION:  NPO currently   NUTRITION DIAGNOSIS:   Inadequate oral intake related to inability to eat, constipation(-acute SBO-) as evidenced by NPO status, CT findings.  GOAL:   Patient will meet greater than or equal to 90% of their needs MONITOR:   Diet advancement, Weight trends, Labs  REASON FOR ASSESSMENT:   Malnutrition Screening Tool    ASSESSMENT: patient is a 68 yo male with hx of DM-2, HTN and chronic constipation. He presents with complaint of abdominal pain.   2/29-CT of the abdomen/pelvis findings-SBO, large stool burden.  Surgery has assessed patient is not recommending surgical intervention at this time.  Patient in the bathroom on RD arrival - has received and enema. Last BM approximately a week ago. Will return tomorrow to obtain nutrition history and assess weight change.     Medications reviewed and include: dulcolax, insulin lopressor, fleet enema.  Labs: BMP Latest Ref Rng & Units 01/15/2019 01/14/2019 01/13/2019  Glucose 70 - 99 mg/dL 150(H) 174(H) 141(H)  BUN 8 - 23 mg/dL _0 Creatinine 0.61 - 1.24 mg/dL 0.75 0.83 0.95  Sodium 135 - 145 mmol/L 140 141 139  Potassium 3.5 - 5.1 mmol/L 3.8 3.8 3.8  Chloride 98 - 111 mmol/L 103 99 102  CO2 22 - 32 mmol/L _1 Calcium 8.9 - 10.3 mg/dL 9.2 9.3 9.4     NUTRITION - FOCUSED PHYSICAL EXAM:   Diet Order:   Diet Order            Diet NPO time specified Except for: Ice Chips  Diet effective now              EDUCATION NEEDS:  Not appropriate for education at this time   Skin:  Skin Assessment: Reviewed RN Assessment  Last BM:  3/1 large  Height:   Ht Readings from Last 1 Encounters:  01/14/19 5' 7" (1.702 m)    Weight:   Wt Readings from Last 1 Encounters:  01/14/19 125.2 kg    Ideal Body Weight:  67 kg  BMI:  Body mass index is 43.23 kg/m.  Estimated Nutritional Needs:   Kcal:  1751-0258 (MSJ  x 1.1)  Protein:  114-121 gr   Fluid:  2.0-2.2 liters daily   Colman Cater MS,RD,CSG,LDN Office: [phone] Pager: [phone]

## 2019-01-16 NOTE — Progress Notes (Signed)
PROGRESS NOTE    Colton Jackson.  GBT:[id number] DOB: 10-Jun-1951 DOA: 01/14/2019 PCP: Sharilyn Sites, MD   Brief Narrative:  Per HPI: Colton Jackson.is a 68 y.o.malewith a history of hypertension, diabetes, chronic back pain, chronic respiratory failure on 2 L nasal cannula at home, obesity hypoventilatory syndrome, obstructive sleep apnea with CPAP use at night. Patient seen for abdominal pain that started earlier this morning, along with nausea pain located in the right upper quadrant. No fluctuation to the patient's pain: No improvement or worsening. Pain is fairly constant rated 5 out of 10. No other palliating or provoking factors. He has had longstanding history of constipation over 8 to 9 years. His last bowel movement was approximately a week ago.  Patient was admitted with concerns for small bowel obstruction noted on imaging, but appears to be passing flatus.  He continues to have some crampy abdominal pain and is not tolerating his diet this morning.  Assessment & Plan:   Principal Problem:   SBO (small bowel obstruction) (HCC) Active Problems:   Type 2 diabetes mellitus without complication (HCC)   Essential hypertension   Obesity hypoventilation syndrome (HCC)   Chronic respiratory failure with hypoxia ([place])  1. Partial small bowel obstruction-persistent.  Appreciate general surgery recommendations with repeat enema today as well as Dulcolax suppositories, will keep n.p.o. except ice chips for now.  Does not appear to need any aggressive intervention at this time.  Will avoid NG tube for now. 2. Type 2 diabetes.  Blood glucose is well controlled.  Will advance oral home agents when tolerating diet. 3. Hypertension.    Hold oral agents and place on hydralazine as scheduled with as needed doses as well.  IV metoprolol added as well as clonidine patch. 4. Obesity hypoventilation syndrome.  Maintain on CPAP at night. 5. Chronic respiratory failure with hypoxemia.  Will  maintain on home oxygen.   DVT prophylaxis: Lovenox Code Status: Full Family Communication: None at bedside Disposition Plan:  Continue to work on bowel obstruction with suppositories and enema.  Appreciate general surgery recommendations.   Consultants:   General surgery  Procedures:   None  Antimicrobials:   None  Subjective: Patient seen and evaluated today with some nausea noted this morning.  Does not appear to be tolerating his diet very well, and continues to have some crampy abdominal pain, but is passing some flatus.  He did have a bowel movement with enema yesterday and is requesting a repeat dose today.  Objective: Vitals:   01/15/19 1338 01/15/19 2128 01/16/19 0616 01/16/19 0945  BP: (!) 160/91 (!) 141/100 (!) 150/83 (!) 146/94  Pulse: 88 76 77 75  Resp: 18     Temp: (!) 97.5 F (36.4 C) 98 F (36.7 C) 98.2 F (36.8 C)   TempSrc: Oral Oral Oral   SpO2: 100% 99% 97%   Weight:      Height:        Intake/Output Summary (Last 24 hours) at 01/16/2019 1122 Last data filed at 01/16/2019 0600 Gross per 24 hour  Intake 940 ml  Output -  Net 940 ml   Filed Weights   01/14/19 1253 01/14/19 2249  Weight: 127.9 kg 125.2 kg    Examination:  General exam: Appears calm and comfortable  Respiratory system: Clear to auscultation. Respiratory effort normal.  Currently with CPAP mask. Cardiovascular system: S1 & S2 heard, RRR. No JVD, murmurs, rubs, gallops or clicks. No pedal edema. Gastrointestinal system: Abdomen is distended, soft and nontender. No  organomegaly or masses felt.  Bowel sounds are diminished. Central nervous system: Alert and oriented. No focal neurological deficits. Extremities: Symmetric 5 x 5 power. Skin: No rashes, lesions or ulcers Psychiatry: Judgement and insight appear normal. Mood & affect appropriate.     Data Reviewed: I have personally reviewed following labs and imaging studies  CBC: Recent Labs  Lab 01/14/19 1322  01/15/19 0722  WBC 12.9* 9.2  HGB 14.2 13.7  HCT 43.8 42.2  MCV 96.1 96.3  PLT 320 856   Basic Metabolic Panel: Recent Labs  Lab 01/13/19 1523 01/14/19 1322 01/15/19 0722  NA 139 141 140  K 3.8 3.8 3.8  CL 102 99 103  CO2 _0 GLUCOSE 141* 174* 150*  BUN _1 CREATININE 0.95 0.83 0.75  CALCIUM 9.4 9.3 9.2   GFR: Estimated Creatinine Clearance: 115.2 mL/min (by C-G formula based on SCr of 0.75 mg/dL). Liver Function Tests: Recent Labs  Lab 01/14/19 1322  AST 24  ALT 15  ALKPHOS 59  BILITOT 0.6  PROT 8.9*  ALBUMIN 4.8   Recent Labs  Lab 01/14/19 1322  LIPASE 17   No results for input(s): AMMONIA in the last 168 hours. Coagulation Profile: No results for input(s): INR, PROTIME in the last 168 hours. Cardiac Enzymes: No results for input(s): CKTOTAL, CKMB, CKMBINDEX, TROPONINI in the last 168 hours. BNP (last 3 results) No results for input(s): PROBNP in the last 8760 hours. HbA1C: No results for input(s): HGBA1C in the last 72 hours. CBG: Recent Labs  Lab 01/15/19 1159 01/15/19 1705 01/15/19 2011 01/16/19 0005 01/16/19 0742  GLUCAP 161* 157* 174* 157* 151*   Lipid Profile: No results for input(s): CHOL, HDL, LDLCALC, TRIG, CHOLHDL, LDLDIRECT in the last 72 hours. Thyroid Function Tests: No results for input(s): TSH, T4TOTAL, FREET4, T3FREE, THYROIDAB in the last 72 hours. Anemia Panel: No results for input(s): VITAMINB12, FOLATE, FERRITIN, TIBC, IRON, RETICCTPCT in the last 72 hours. Sepsis Labs: No results for input(s): PROCALCITON, LATICACIDVEN in the last 168 hours.  No results found for this or any previous visit (from the past 240 hour(s)).       Radiology Studies: Dg Chest 2 View  Result Date: 01/14/2019 CLINICAL DATA:  Acute shortness of breath. EXAM: CHEST - 2 VIEW COMPARISON:  07/28/2018 and prior radiographs FINDINGS: Cardiomegaly and increased vascularity again noted. Mild peribronchial thickening is unchanged. There is  no evidence of focal airspace disease, pulmonary edema, suspicious pulmonary nodule/mass, pleural effusion, or pneumothorax. No acute bony abnormalities are identified. IMPRESSION: Cardiomegaly without evidence of acute cardiopulmonary disease. Increased pulmonary vascularity and mild chronic peribronchial thickening again noted. Electronically Signed   By: Margarette Canada M.D.   On: 01/14/2019 15:04   Ct Abdomen Pelvis W Contrast  Result Date: 01/14/2019 CLINICAL DATA:  Abdominal pain. EXAM: CT ABDOMEN AND PELVIS WITH CONTRAST TECHNIQUE: Multidetector CT imaging of the abdomen and pelvis was performed using the standard protocol following bolus administration of intravenous contrast. CONTRAST:  174m OMNIPAQUE IOHEXOL 300 MG/ML  SOLN COMPARISON:  05/02/2015 FINDINGS: Lower chest: No acute abnormality. Similar appearance of multiple hyperdense lymph nodes within the mediastinum and hilar regions which may be the sequelae of prior granulomatous disease. Hepatobiliary: The caudate lobe and lateral segment of left lobe of liver appear enlarged. No focal liver abnormality identified. The gallbladder is normal. No biliary dilatation. Pancreas: Unremarkable. No pancreatic ductal dilatation or surrounding inflammatory changes. Spleen: Normal in size without focal abnormality. Adrenals/Urinary Tract: Normal appearance of the  adrenal glands. No kidney mass or hydronephrosis identified. The urinary bladder appears within normal limits. Stomach/Bowel: The stomach appears distended. The proximal small bowel loops are dilated measuring up to 4 cm in diameter. Fecalization of the dilated mid small bowel loops noted. Within the right upper quadrant of the abdomen there is a transition point to decreased caliber distal small bowel, image 34/5, and image 43/3. A large stool burden is identified throughout the colon which may reflect constipation. Vascular/Lymphatic: Aortic atherosclerosis without aneurysm. The portal vein appears  patent. No adenopathy. Reproductive: Prostate is unremarkable. Other: No free fluid or fluid collection within the abdomen or pelvis. Musculoskeletal: Spondylosis within the thoracolumbar spine. No aggressive lytic or sclerotic bone lesions identified. IMPRESSION: 1. Small bowel obstruction. A transition point is identified within the right upper quadrant of the abdomen. Findings may reflect sequelae of adhesions. No mass identified. 2. Large stool burden within the colon which may reflect constipation. 3.  Aortic Atherosclerosis (ICD10-I70.0). 4. Morphologic features of the liver which may reflect early cirrhosis. Electronically Signed   By: Kerby Moors M.D.   On: 01/14/2019 15:05        Scheduled Meds: . amLODipine  10 mg Oral Daily  . aspirin EC  162 mg Oral Daily  . bisacodyl  10 mg Rectal BID  . cloNIDine  0.2 mg Transdermal Weekly  . enoxaparin (LOVENOX) injection  0.5 mg/kg Subcutaneous Q24H  . hydrALAZINE  2 mg Intravenous Q6H  . insulin aspart  0-15 Units Subcutaneous Q6H  . latanoprost  1 drop Both Eyes QHS  . losartan  100 mg Oral Daily  . metoprolol succinate  50 mg Oral Daily  . metoprolol tartrate  5 mg Intravenous Q12H  . milk and molasses  1 enema Rectal Once  . sodium phosphate  1 enema Rectal Once   Continuous Infusions: . famotidine (PEPCID) IV 20 mg (01/15/19 2350)     LOS: 2 days    Time spent: 30 minutes    Pratik Darleen Crocker, DO [facility] Pager [phone]  If 7PM-7AM, please contact night-coverage [email or web address] Password [email or web address] 01/16/2019, 11:22 AM

## 2019-01-16 NOTE — Progress Notes (Signed)
Subjective: Patient did have an episode of emesis, although he was also trying to adjust his CPAP mask.  Had minimal results with previous enema.  Is passing gas.  Abdominal pain is crampy in nature.  Objective: Vital signs in last 24 hours: Temp:  [97.5 F (36.4 C)-98.2 F (36.8 C)] 98.2 F (36.8 C) (03/02 0616) Pulse Rate:  [75-88] 75 (03/02 0945) Resp:  [18] 18 (03/01 1338) BP: (141-160)/(83-100) 146/94 (03/02 0945) SpO2:  [97 %-100 %] 97 % (03/02 0616) Last BM Date: 01/15/19  Intake/Output from previous day: 03/01 0701 - 03/02 0700 In: 940 [P.O.:840; IV Piggyback:100] Out: -  Intake/Output this shift: No intake/output data recorded.  General appearance: alert, cooperative and no distress GI: Soft with mild distention noted.  Minimal bowel sounds appreciated.  No rigidity is noted.  Nonspecific tenderness is noted to palpation.  Lab Results:  Recent Labs    01/14/19 1322 01/15/19 0722  WBC 12.9* 9.2  HGB 14.2 13.7  HCT 43.8 42.2  PLT 320 319   BMET Recent Labs    01/14/19 1322 01/15/19 0722  NA 141 140  K 3.8 3.8  CL 99 103  CO2 30 26  GLUCOSE 174* 150*  BUN 23 18  CREATININE 0.83 0.75  CALCIUM 9.3 9.2   PT/INR No results for input(s): LABPROT, INR in the last 72 hours.  Studies/Results: Dg Chest 2 View  Result Date: 01/14/2019 CLINICAL DATA:  Acute shortness of breath. EXAM: CHEST - 2 VIEW COMPARISON:  07/28/2018 and prior radiographs FINDINGS: Cardiomegaly and increased vascularity again noted. Mild peribronchial thickening is unchanged. There is no evidence of focal airspace disease, pulmonary edema, suspicious pulmonary nodule/mass, pleural effusion, or pneumothorax. No acute bony abnormalities are identified. IMPRESSION: Cardiomegaly without evidence of acute cardiopulmonary disease. Increased pulmonary vascularity and mild chronic peribronchial thickening again noted. Electronically Signed   By: Margarette Canada M.D.   On: 01/14/2019 15:04   Ct Abdomen  Pelvis W Contrast  Result Date: 01/14/2019 CLINICAL DATA:  Abdominal pain. EXAM: CT ABDOMEN AND PELVIS WITH CONTRAST TECHNIQUE: Multidetector CT imaging of the abdomen and pelvis was performed using the standard protocol following bolus administration of intravenous contrast. CONTRAST:  122m OMNIPAQUE IOHEXOL 300 MG/ML  SOLN COMPARISON:  05/02/2015 FINDINGS: Lower chest: No acute abnormality. Similar appearance of multiple hyperdense lymph nodes within the mediastinum and hilar regions which may be the sequelae of prior granulomatous disease. Hepatobiliary: The caudate lobe and lateral segment of left lobe of liver appear enlarged. No focal liver abnormality identified. The gallbladder is normal. No biliary dilatation. Pancreas: Unremarkable. No pancreatic ductal dilatation or surrounding inflammatory changes. Spleen: Normal in size without focal abnormality. Adrenals/Urinary Tract: Normal appearance of the adrenal glands. No kidney mass or hydronephrosis identified. The urinary bladder appears within normal limits. Stomach/Bowel: The stomach appears distended. The proximal small bowel loops are dilated measuring up to 4 cm in diameter. Fecalization of the dilated mid small bowel loops noted. Within the right upper quadrant of the abdomen there is a transition point to decreased caliber distal small bowel, image 34/5, and image 43/3. A large stool burden is identified throughout the colon which may reflect constipation. Vascular/Lymphatic: Aortic atherosclerosis without aneurysm. The portal vein appears patent. No adenopathy. Reproductive: Prostate is unremarkable. Other: No free fluid or fluid collection within the abdomen or pelvis. Musculoskeletal: Spondylosis within the thoracolumbar spine. No aggressive lytic or sclerotic bone lesions identified. IMPRESSION: 1. Small bowel obstruction. A transition point is identified within the right upper quadrant of  the abdomen. Findings may reflect sequelae of adhesions.  No mass identified. 2. Large stool burden within the colon which may reflect constipation. 3.  Aortic Atherosclerosis (ICD10-I70.0). 4. Morphologic features of the liver which may reflect early cirrhosis. Electronically Signed   By: Kerby Moors M.D.   On: 01/14/2019 15:05    Anti-infectives: Anti-infectives (From admission, onward)   None      Assessment/Plan: Impression: Partial small bowel obstruction with constipation Plan: We will repeat enemas and start Dulcolax suppositories.  No need for acute surgical intervention at this time.  Will continue to monitor.  LOS: 2 days    Aviva Signs 01/16/2019

## 2019-01-16 NOTE — Care Management Important Message (Signed)
Important Message  Patient Details  Name: Colton Jackson. MRN: [id number] Date of Birth: [birth year]   Medicare Important Message Given:  Yes    Tommy Medal 01/16/2019, 12:09 PM

## 2019-01-16 NOTE — Progress Notes (Signed)
Patient states he threw up clear liquid this morning when attempting to drink water. Endorses increased abdominal pain of 8/10. Abdomen distended and no bowel sounds auscultated. Paged Dr. Manuella Ghazi and will continue to monitor.

## 2019-01-17 LAB — COMPREHENSIVE METABOLIC PANEL
ALT: 13 U/L (ref 0–44)
AST: 17 U/L (ref 15–41)
Albumin: 4.3 g/dL (ref 3.5–5.0)
Alkaline Phosphatase: 52 U/L (ref 38–126)
Anion gap: 14 (ref 5–15)
BUN: 15 mg/dL (ref 8–23)
CHLORIDE: 99 mmol/L (ref 98–111)
CO2: 25 mmol/L (ref 22–32)
Calcium: 9.2 mg/dL (ref 8.9–10.3)
Creatinine, Ser: 0.66 mg/dL (ref 0.61–1.24)
GFR calc Af Amer: >60 mL/min (ref >60)
GFR calc non Af Amer: >60 mL/min (ref >60)
Glucose, Bld: 127 mg/dL — ABNORMAL HIGH (ref 70–99)
Potassium: 3 mmol/L — ABNORMAL LOW (ref 3.5–5.1)
Sodium: 138 mmol/L (ref 135–145)
Total Bilirubin: 0.9 mg/dL (ref 0.3–1.2)
Total Protein: 8.2 g/dL — ABNORMAL HIGH (ref 6.5–8.1)

## 2019-01-17 LAB — GLUCOSE, CAPILLARY
Glucose-Capillary: 142 mg/dL — ABNORMAL HIGH (ref 70–99)
Glucose-Capillary: 165 mg/dL — ABNORMAL HIGH (ref 70–99)
Glucose-Capillary: 139 mg/dL — ABNORMAL HIGH (ref 70–99)

## 2019-01-17 MED ORDER — HYDROCODONE-ACETAMINOPHEN 10-325 MG PO TABS: 1.0000 | ORAL_TABLET | Freq: Three times a day (TID) | ORAL | Status: DC | PRN

## 2019-01-17 MED ORDER — CLONIDINE HCL 0.2 MG PO TABS
0.2000 mg | ORAL_TABLET | Freq: Three times a day (TID) | ORAL | Status: DC
  Administered 2019-01-17 – 2019-01-19 (×7): 0.2 mg via ORAL
  Filled 2019-01-17 (×8): qty 1

## 2019-01-17 MED ORDER — DARIFENACIN HYDROBROMIDE ER 7.5 MG PO TB24
7.5000 mg | ORAL_TABLET | Freq: Every day | ORAL | Status: DC
  Administered 2019-01-17 – 2019-01-19 (×3): 7.5 mg via ORAL
  Filled 2019-01-17 (×3): qty 1

## 2019-01-17 MED ORDER — METFORMIN HCL ER 500 MG PO TB24
1000.0000 mg | ORAL_TABLET | Freq: Two times a day (BID) | ORAL | Status: DC
  Administered 2019-01-17 – 2019-01-19 (×5): 1000 mg via ORAL
  Filled 2019-01-17 (×5): qty 2

## 2019-01-17 MED ORDER — GLIPIZIDE ER 5 MG PO TB24
10.0000 mg | ORAL_TABLET | Freq: Two times a day (BID) | ORAL | Status: DC
  Administered 2019-01-17 – 2019-01-19 (×5): 10 mg via ORAL
  Filled 2019-01-17 (×5): qty 2

## 2019-01-17 MED ORDER — BISACODYL 10 MG RE SUPP
10.0000 mg | Freq: Every day | RECTAL | Status: DC | PRN
  Administered 2019-01-17: 10 mg via RECTAL
  Filled 2019-01-17: qty 1

## 2019-01-17 MED ORDER — POTASSIUM CHLORIDE CRYS ER 20 MEQ PO TBCR
40.0000 meq | EXTENDED_RELEASE_TABLET | Freq: Two times a day (BID) | ORAL | Status: AC
  Administered 2019-01-17 (×2): 40 meq via ORAL
  Filled 2019-01-17 (×2): qty 2

## 2019-01-17 MED ORDER — FAMOTIDINE 20 MG PO TABS
20.0000 mg | ORAL_TABLET | Freq: Two times a day (BID) | ORAL | Status: DC
  Administered 2019-01-17 – 2019-01-18 (×3): 20 mg via ORAL
  Filled 2019-01-17 (×5): qty 1

## 2019-01-17 MED ORDER — MECLIZINE HCL 12.5 MG PO TABS: 25.0000 mg | ORAL_TABLET | Freq: Three times a day (TID) | ORAL | Status: DC | PRN

## 2019-01-17 MED ORDER — ALUM & MAG HYDROXIDE-SIMETH 200-200-20 MG/5ML PO SUSP
30.0000 mL | Freq: Four times a day (QID) | ORAL | Status: DC | PRN
  Administered 2019-01-17 – 2019-01-18 (×2): 30 mL via ORAL
  Filled 2019-01-17 (×2): qty 30

## 2019-01-17 NOTE — Progress Notes (Signed)
PROGRESS NOTE    Colton Jackson.  JJK:[id number] DOB: [birth year] DOA: 01/14/2019 PCP: Sharilyn Sites, MD   Brief Narrative:  Per HPI:  Colton Jacksonis a 68 y.o.malewith a history of hypertension, diabetes, chronic back pain, chronic respiratory failure on 2 L nasal cannula at home, obesity hypoventilatory syndrome, obstructive sleep apnea with CPAP use at night. Patient seen for abdominal pain that started earlier this morning, along with nausea pain located in the right upper quadrant. No fluctuation to the patient's pain: No improvement or worsening. Pain is fairly constant rated 5 out of 10. No other palliating or provoking factors. He has had longstanding history of constipation over 8 to 9 years. His last bowel movement was approximately a week ago.  Patient was admitted with concerns for small bowel obstruction noted on imaging and is now improving after receiving a couple enemas with diet being advanced this morning.  Assessment & Plan:   Principal Problem:   SBO (small bowel obstruction) (HCC) Active Problems:   Type 2 diabetes mellitus without complication (HCC)   Essential hypertension   Obesity hypoventilation syndrome (HCC)   Chronic respiratory failure with hypoxia ([place])  1. Partial small bowel obstruction- improving. Appreciate general surgery recommendations with diet advancement for today, started on full liquid diet. 2. Type 2 diabetes.  Resume home metformin and glipizide for blood glucose control as patient's diet is being advanced and he is doing better. 3. Hypertension.     Resume usual home agents. 4. Obesity hypoventilation syndrome. Maintain on CPAP at night. 5. Chronic respiratory failure with hypoxemia. Will maintain on home oxygen.   DVT prophylaxis:Lovenox Code Status:Full Family Communication:None at bedside Disposition Plan: Continue to advance diet as tolerated.  Resumed on usual home medications for blood pressure and blood  glucose control.  Anticipate discharge in the next 24 to 48 hours if further improved.  Appreciate general surgery recommendations.   Consultants:  General surgery  Procedures:  None  Antimicrobials:   None  Subjective: Patient seen and evaluated today with no new acute complaints or concerns. No acute concerns or events noted overnight.  He has had 2 large bowel movements last night after enema and is feeling better this morning with less abdominal pain and symptoms.  Objective: Vitals:   01/16/19 1324 01/16/19 2134 01/17/19 0008 01/17/19 0611  BP: (!) 142/79 (!) 147/78 (!) 147/91 (!) 145/93  Pulse: 76 85  96  Resp: 19     Temp: 98.2 F (36.8 C) 99.2 F (37.3 C)  98.3 F (36.8 C)  TempSrc: Oral Oral  Oral  SpO2: 98% 99%  98%  Weight:      Height:        Intake/Output Summary (Last 24 hours) at 01/17/2019 1121 Last data filed at 01/17/2019 0600 Gross per 24 hour  Intake 530 ml  Output -  Net 530 ml   Filed Weights   01/14/19 1253 01/14/19 2249  Weight: 127.9 kg 125.2 kg    Examination:  General exam: Appears calm and comfortable  Respiratory system: Clear to auscultation. Respiratory effort normal. Cardiovascular system: S1 & S2 heard, RRR. No JVD, murmurs, rubs, gallops or clicks. No pedal edema. Gastrointestinal system: Abdomen is nondistended, soft and nontender. No organomegaly or masses felt. Normal bowel sounds heard. Central nervous system: Alert and oriented. No focal neurological deficits. Extremities: Symmetric 5 x 5 power. Skin: No rashes, lesions or ulcers Psychiatry: Judgement and insight appear normal. Mood & affect appropriate.     Data Reviewed: I  have personally reviewed following labs and imaging studies  CBC: Recent Labs  Lab 01/14/19 1322 01/15/19 0722  WBC 12.9* 9.2  HGB 14.2 13.7  HCT 43.8 42.2  MCV 96.1 96.3  PLT 320 119   Basic Metabolic Panel: Recent Labs  Lab 01/13/19 1523 01/14/19 1322 01/15/19 0722  01/17/19 0548  NA 139 141 140 138  K 3.8 3.8 3.8 3.0*  CL 102 99 103 99  CO2 _0 GLUCOSE 141* 174* 150* 127*  BUN _1 CREATININE 0.95 0.83 0.75 0.66  CALCIUM 9.4 9.3 9.2 9.2   GFR: Estimated Creatinine Clearance: 115.2 mL/min (by C-G formula based on SCr of 0.66 mg/dL). Liver Function Tests: Recent Labs  Lab 01/14/19 1322 01/17/19 0548  AST 24 17  ALT 15 13  ALKPHOS 59 52  BILITOT 0.6 0.9  PROT 8.9* 8.2*  ALBUMIN 4.8 4.3   Recent Labs  Lab 01/14/19 1322  LIPASE 17   No results for input(s): AMMONIA in the last 168 hours. Coagulation Profile: No results for input(s): INR, PROTIME in the last 168 hours. Cardiac Enzymes: No results for input(s): CKTOTAL, CKMB, CKMBINDEX, TROPONINI in the last 168 hours. BNP (last 3 results) No results for input(s): PROBNP in the last 8760 hours. HbA1C: No results for input(s): HGBA1C in the last 72 hours. CBG: Recent Labs  Lab 01/16/19 0742 01/16/19 1400 01/16/19 1834 01/16/19 2340 01/17/19 0613  GLUCAP 151* 146* 141* 142* 142*   Lipid Profile: No results for input(s): CHOL, HDL, LDLCALC, TRIG, CHOLHDL, LDLDIRECT in the last 72 hours. Thyroid Function Tests: No results for input(s): TSH, T4TOTAL, FREET4, T3FREE, THYROIDAB in the last 72 hours. Anemia Panel: No results for input(s): VITAMINB12, FOLATE, FERRITIN, TIBC, IRON, RETICCTPCT in the last 72 hours. Sepsis Labs: No results for input(s): PROCALCITON, LATICACIDVEN in the last 168 hours.  No results found for this or any previous visit (from the past 240 hour(s)).       Radiology Studies: No results found.      Scheduled Meds: . amLODipine  10 mg Oral Daily  . aspirin EC  162 mg Oral Daily  . cloNIDine  0.2 mg Oral TID  . darifenacin  7.5 mg Oral Daily  . enoxaparin (LOVENOX) injection  0.5 mg/kg Subcutaneous Q24H  . glipiZIDE  10 mg Oral BID  . insulin aspart  0-15 Units Subcutaneous Q6H  . latanoprost  1 drop Both Eyes QHS  . losartan   100 mg Oral Daily  . metFORMIN  1,000 mg Oral BID  . metoprolol succinate  50 mg Oral Daily  . polyethylene glycol  17 g Oral BID  . potassium chloride  40 mEq Oral BID   Continuous Infusions: . famotidine (PEPCID) IV 20 mg (01/16/19 2243)     LOS: 3 days    Time spent: 30 minutes    Valli Randol Darleen Crocker, DO [facility] Pager [phone]  If 7PM-7AM, please contact night-coverage [email or web address] Password [email or web address] 01/17/2019, 11:21 AM

## 2019-01-17 NOTE — Progress Notes (Signed)
  Subjective: Patient has had multiple bowel movements over the past 24 hours.  No abdominal pain noted.  No nausea or vomiting.  He states he does feel better.  Objective: Vital signs in last 24 hours: Temp:  [98.2 F (36.8 C)-99.2 F (37.3 C)] 98.3 F (36.8 C) (03/03 0611) Pulse Rate:  [75-96] 96 (03/03 0611) Resp:  [19] 19 (03/02 1324) BP: (142-147)/(78-94) 145/93 (03/03 0611) SpO2:  [98 %-99 %] 98 % (03/03 0611) Last BM Date: 01/16/19  Intake/Output from previous day: 03/02 0701 - 03/03 0700 In: 530 [P.O.:480; IV Piggyback:50] Out: -  Intake/Output this shift: No intake/output data recorded.  General appearance: alert, cooperative and no distress GI: soft, non-tender; bowel sounds normal; no masses,  no organomegaly  Lab Results:  Recent Labs    01/14/19 1322 01/15/19 0722  WBC 12.9* 9.2  HGB 14.2 13.7  HCT 43.8 42.2  PLT 320 319   BMET Recent Labs    01/15/19 0722 01/17/19 0548  NA 140 138  K 3.8 3.0*  CL 103 99  CO2 26 25  GLUCOSE 150* 127*  BUN 18 15  CREATININE 0.75 0.66  CALCIUM 9.2 9.2   PT/INR No results for input(s): LABPROT, INR in the last 72 hours.  Studies/Results: No results found.  Anti-infectives: Anti-infectives (From admission, onward)   None      Assessment/Plan: Impression: Partial small bowel obstruction, resolving.  Constipation resolving. Plan: Advance diet as tolerated.  No need for acute surgical intervention.  LOS: 3 days    Aviva Signs 01/17/2019

## 2019-01-17 NOTE — Progress Notes (Signed)
Pt accidentally removed IV when washing hands. Tolerating oral meds and diet well so far. MD made aware, stated it was ok to leave IV out for now as long as he is tolerating diet.

## 2019-01-18 LAB — CBC
HCT: 38.4 % — ABNORMAL LOW (ref 39.0–52.0)
Hemoglobin: 12.5 g/dL — ABNORMAL LOW (ref 13.0–17.0)
MCH: 30.7 pg (ref 26.0–34.0)
MCHC: 32.6 g/dL (ref 30.0–36.0)
MCV: 94.3 fL (ref 80.0–100.0)
Platelets: 284 10*3/uL (ref 150–400)
RBC: 4.07 MIL/uL — AB (ref 4.22–5.81)
RDW: 13.1 % (ref 11.5–15.5)
WBC: 6.9 10*3/uL (ref 4.0–10.5)
nRBC: 0 % (ref 0.0–0.2)

## 2019-01-18 LAB — COMPREHENSIVE METABOLIC PANEL
ALT: 13 U/L (ref 0–44)
ANION GAP: 11 (ref 5–15)
AST: 16 U/L (ref 15–41)
Albumin: 3.7 g/dL (ref 3.5–5.0)
Alkaline Phosphatase: 43 U/L (ref 38–126)
BUN: 12 mg/dL (ref 8–23)
CO2: 27 mmol/L (ref 22–32)
Calcium: 8.9 mg/dL (ref 8.9–10.3)
Chloride: 100 mmol/L (ref 98–111)
Creatinine, Ser: 0.58 mg/dL — ABNORMAL LOW (ref 0.61–1.24)
GFR calc Af Amer: >60 mL/min (ref >60)
GFR calc non Af Amer: >60 mL/min (ref >60)
Glucose, Bld: 119 mg/dL — ABNORMAL HIGH (ref 70–99)
Potassium: 3.4 mmol/L — ABNORMAL LOW (ref 3.5–5.1)
Sodium: 138 mmol/L (ref 135–145)
TOTAL PROTEIN: 7 g/dL (ref 6.5–8.1)
Total Bilirubin: 1 mg/dL (ref 0.3–1.2)

## 2019-01-18 LAB — GLUCOSE, CAPILLARY
Glucose-Capillary: 124 mg/dL — ABNORMAL HIGH (ref 70–99)
Glucose-Capillary: 147 mg/dL — ABNORMAL HIGH (ref 70–99)
Glucose-Capillary: 67 mg/dL — ABNORMAL LOW (ref 70–99)
Glucose-Capillary: 80 mg/dL (ref 70–99)
Glucose-Capillary: 92 mg/dL (ref 70–99)

## 2019-01-18 LAB — MAGNESIUM: MAGNESIUM: 1.9 mg/dL (ref 1.7–2.4)

## 2019-01-18 MED ORDER — SENNOSIDES-DOCUSATE SODIUM 8.6-50 MG PO TABS: 2.0000 | ORAL_TABLET | Freq: Two times a day (BID) | ORAL | 1 refills | Status: DC

## 2019-01-18 MED ORDER — FAMOTIDINE 20 MG PO TABS: 20.0000 mg | ORAL_TABLET | Freq: Once | ORAL | Status: DC

## 2019-01-18 MED ORDER — POTASSIUM CHLORIDE CRYS ER 20 MEQ PO TBCR
40.0000 meq | EXTENDED_RELEASE_TABLET | Freq: Once | ORAL | Status: AC
  Administered 2019-01-18: 40 meq via ORAL
  Filled 2019-01-18: qty 2

## 2019-01-18 MED ORDER — FUROSEMIDE 40 MG PO TABS: 40.0000 mg | ORAL_TABLET | Freq: Every day | ORAL | 3 refills | Status: DC

## 2019-01-18 MED ORDER — CLONIDINE HCL 0.2 MG PO TABS: 0.2000 mg | ORAL_TABLET | Freq: Three times a day (TID) | ORAL | 3 refills | Status: DC

## 2019-01-18 NOTE — Care Management Important Message (Signed)
Important Message  Patient Details  Name: Colton Jackson. MRN: [id number] Date of Birth: 1951/03/29   Medicare Important Message Given:  Yes    Tommy Medal 01/18/2019, 2:38 PM

## 2019-01-18 NOTE — Progress Notes (Signed)
  Subjective: Patient continues to have multiple loose bowel movements. Denies abdominal pain, nausea or vomiting. Patient tolerating full liquid diet. Endorses improvement.   Objective: Vital signs in last 24 hours: Temp:  [97.6 F (36.4 C)-98.8 F (37.1 C)] 98.4 F (36.9 C) (03/04 0814) Pulse Rate:  [87-99] 87 (03/04 0814) Resp:  [16-20] 18 (03/04 0814) BP: (134-161)/(62-85) 134/62 (03/04 0814) SpO2:  [98 %-100 %] 98 % (03/04 0814) Last BM Date: 01/18/19  Intake/Output from previous day: No intake/output data recorded. Intake/Output this shift: No intake/output data recorded.  General appearance: alert, cooperative and no distress GI: soft, nontender, nondistended   Lab Results:  Recent Labs    01/18/19 0506  WBC 6.9  HGB 12.5*  HCT 38.4*  PLT 284   BMET Recent Labs    01/17/19 0548 01/18/19 0506  NA 138 138  K 3.0* 3.4*  CL 99 100  CO2 25 27  GLUCOSE 127* 119*  BUN 15 12  CREATININE 0.66 0.58*  CALCIUM 9.2 8.9   PT/INR No results for input(s): LABPROT, INR in the last 72 hours.  Studies/Results: No results found.  Anti-infectives: Anti-infectives (From admission, onward)   None      Assessment/Plan: IMP: Small bowel obstruction resolved.  Plan: Agree with advancement of diet. No need for surgical intervention. Anticipate discharge in the 24 hours.   LOS: 4 days    Mercy Moore 01/18/2019

## 2019-01-18 NOTE — Discharge Summary (Addendum)
Colton Gasparro., is a 68 y.o. male  DOB November 08, 1951  MRN [id number].  Admission date:  01/14/2019  Admitting Physician  Rodena Goldmann, DO  Discharge Date:  01/18/2019   Primary MD  Sharilyn Sites, MD  Recommendations for primary care physician for things to follow:   1)Soft diet advised 2)Take medications as prescribed 3)Please follow-up with Dr. Arnoldo Morale in a couple of weeks the general Surgeon for EGD/Upper Endoscopy as well as colonoscopy--- you have had unexplained weight loss, poor appetite, bowel Obstruction and Dyspepsia/Reflux symptoms--- we need to figure out the reason for your weight loss and gastrointestinal symptoms, we need to make sure you do not have cancer   Admission Diagnosis  SBO (small bowel obstruction) ([place]) [K56.609]   Discharge Diagnosis  SBO (small bowel obstruction) ([place]) [K56.609]    Principal Problem:   SBO (small bowel obstruction) ([place]) Active Problems:   Type 2 diabetes mellitus without complication ([place])   Essential hypertension   Obesity hypoventilation syndrome ([place])   Chronic respiratory failure with hypoxia ([place])      Past Medical History:  Diagnosis Date  . Allergic rhinitis   . Degenerative disk disease   . Essential hypertension   . H/O cardiovascular stress test 02/02/11   EF 59% - Normal stress test  . Knee pain, bilateral   . Lower back pain   . Morbid obesity ([place])   . OSA (obstructive sleep apnea)    On CPAP  . Type 2 diabetes mellitus ([place])     Past Surgical History:  Procedure Laterality Date  . APPENDECTOMY    . Cataract surgery Bilateral   . KNEE ARTHROSCOPY Bilateral   . NASAL SEPTOPLASTY W/ TURBINOPLASTY    . RIGHT/LEFT HEART CATH AND CORONARY ANGIOGRAPHY N/A 06/23/2018   Procedure: RIGHT/LEFT HEART CATH AND CORONARY ANGIOGRAPHY;  Surgeon: Larey Dresser, MD;  Location: [facility];  Service: Cardiovascular;  Laterality: N/A;        HPI  from the history and physical done on the day of admission:    Patient Coming From: home  Chief Complaint: abdominal pain  HPI: Colton Murdoch. is a 68 y.o. male with a history of hypertension, diabetes, chronic back pain, chronic respiratory failure on 2 L nasal cannula at home, obesity hypoventilatory syndrome, obstructive sleep apnea with CPAP use at night.  Patient seen for abdominal pain that started earlier this morning, along with nausea pain located in the right upper quadrant.  No fluctuation to the patient's pain: No improvement or worsening.  Pain is fairly constant rated 5 out of 10.  No other palliating or provoking factors.  He has had longstanding history of constipation over 8 to 9 years.  His last bowel movement was approximately a week ago.  Emergency Department Course: CT shows small bowel obstruction with inflection point of right upper quadrant.  White count 12  Review of Systems:   Pt complains of unintentional 50 pound weight loss in 6 months.  Pt denies any fevers, chills, night  sweats, nausea, vomiting, diarrhea, constipation, abdominal pain, shortness of breath, dyspnea on exertion, orthopnea, cough, wheezing, palpitations, headache, vision changes, lightheadedness, dizziness, melena, rectal bleeding.  Review of systems are otherwise negative     Hospital Course:   Brief Narrative:  Per HPI:  Colton Jackson.is a 69 y.o.malewith a history of hypertension, diabetes, chronic back pain, chronic respiratory failure on 2 L nasal cannula at home, obesity hypoventilatory syndrome, obstructive sleep apnea with CPAP use at night. Patient seen for abdominal pain that started earlier this morning, along with nausea pain located in the right upper quadrant. No fluctuation to the patient's pain: No improvement or worsening. Pain is fairly constant rated 5 out of 10. No other palliating or provoking factors. He has had longstanding history of  constipation over 8 to 9 years. His last bowel movement was approximately a week ago.  Patient was admitted with concerns for small bowel obstruction noted on imaging.   Plan:-  1. Partial small bowel obstruction-  clinically resolved,appreciate general surgery recommendations with diet advancement for today,  had enemas and laxatives, tolerated full liquid diet on 01/17/2019, had quite a bit of bowel movements, tolerating solid food on 01/18/2019 without emesis  2. Type 2 diabetes-stable, Resume home metformin and glipizide for blood glucose control as patient is back on solid food   3. hypertension.Stable, Resume usual home agents.   4. Obesity hypoventilation syndrome. Maintain on CPAP at night.   5. Chronic respiratory failure with hypoxemia--- continue home oxygen at 2 to 3 L continuously, oxygen requirement is currently at patient's baseline, no acute exacerbation of his chronic hypoxic respiratory failure at this time  6) OSA/obesity----stable, continue CPAP nightly  7) small bowel obstruction/anorexia/Unexplained 50 pound weight loss----patient has heartburn/dyspepsia, last colonoscopy according to patient was in 2010, patient is strongly advised to follow-up with  physician-Dr. Arnoldo Morale for EGD and colonoscopy to rule out underlying malignancy  8) generalized weakness/debility-posthospitalization weakness----advised home health physical therapy and RN    Consultants:  General surgery  Discharge Condition: stable  Follow UP  Follow-up Information    Rourk, Cristopher Estimable, MD.   Specialty:  Gastroenterology Why:  Dr. Roseanne Kaufman office will be calling the patient with an appointment date and time. Contact information: [place] [address] [phone]           Diet and Activity recommendation:  As advised  Discharge Instructions    Discharge Instructions    Call MD for:  difficulty breathing, headache or visual disturbances   Complete by:  As  directed    Call MD for:  persistant dizziness or light-headedness   Complete by:  As directed    Call MD for:  persistant nausea and vomiting   Complete by:  As directed    Call MD for:  severe uncontrolled pain   Complete by:  As directed    Call MD for:  temperature >100.4   Complete by:  As directed    Diet - low sodium heart healthy   Complete by:  As directed    Diet Carb Modified   Complete by:  As directed    Discharge instructions   Complete by:  As directed    1)Soft diet advised 2)Take medications as prescribed 3)Please follow-up with Dr. Sydell Axon the Gastroenterologist for EGD/Upper Endoscopy as well as colonoscopy--- you have had unexplained weight loss, poor appetite, bowel Obstruction and Dyspepsia/Reflux symptoms--- we need to figure out the reason for your weight loss and gastrointestinal symptoms, we need  to make sure you do not have cancer   Face-to-face encounter (required for Medicare/Medicaid patients)   Complete by:  As directed    I Peyton Rossner certify that this patient is under my care and that I, or a nurse practitioner or physician's assistant working with me, had a face-to-face encounter that meets the physician face-to-face encounter requirements with this patient on 01/18/2019. The encounter with the patient was in whole, or in part for the following medical condition(s) which is the primary reason for home health care (List medical condition):   RN for cardiopulmonary assessment   The encounter with the patient was in whole, or in part, for the following medical condition, which is the primary reason for home health care:  Posthospitalization weakness and debility/chronic hypoxic respiratory failure as well as OSA on CPAP   I certify that, based on my findings, the following services are medically necessary home health services:   Nursing Physical therapy     Reason for Medically Necessary Home Health Services:  Skilled Nursing- Change/Decline in Patient  Status   My clinical findings support the need for the above services:  Shortness of breath with activity   Further, I certify that my clinical findings support that this patient is homebound due to:  Shortness of Breath with activity   Home Health   Complete by:  As directed    Posthospitalization weakness/generalized weakness and debility  Chronic hypoxic respiratory failure on home O2 as well as OSA on CPAP   To provide the following care/treatments:   PT RN     Increase activity slowly   Complete by:  As directed        Discharge Medications     Allergies as of 01/18/2019      Reactions   Sulfa Antibiotics Swelling   eyelids      Medication List    STOP taking these medications   meloxicam 7.5 MG tablet Commonly known as:  MOBIC     TAKE these medications   acetaminophen 500 MG tablet Commonly known as:  TYLENOL Take 500 mg by mouth every 6 (six) hours as needed for mild pain or moderate pain (In addition to Hydrocodone/Apap as needed for pain).   amLODipine 10 MG tablet Commonly known as:  NORVASC Take 1 tablet (10 mg total) by mouth daily.   aspirin 325 MG EC tablet Take 162.5 mg by mouth daily.   cloNIDine 0.2 MG tablet Commonly known as:  CATAPRES Take 1 tablet (0.2 mg total) by mouth 3 (three) times daily.   furosemide 40 MG tablet Commonly known as:  LASIX Take 1 tablet (40 mg total) by mouth daily.   glipiZIDE 10 MG 24 hr tablet Commonly known as:  GLUCOTROL XL Take 10 mg by mouth 2 (two) times daily.   HYDROcodone-acetaminophen 10-325 MG tablet Commonly known as:  NORCO Take 1 tablet by mouth 3 (three) times daily as needed for moderate pain.   insulin detemir 100 UNIT/ML injection Commonly known as:  LEVEMIR Inject 30-40 Units into the skin daily as needed (for high blood sugar levels).   KRILL OIL PO Take 1 tablet by mouth daily.   latanoprost 0.005 % ophthalmic solution Commonly known as:  XALATAN Place 1 drop into both eyes at  bedtime.   losartan 100 MG tablet Commonly known as:  COZAAR Take 100 mg by mouth daily.   meclizine 25 MG tablet Commonly known as:  ANTIVERT Take 25 mg by mouth 3 (three) times daily  as needed for dizziness or nausea (when taking hydrocodone/apap).   metFORMIN 500 MG 24 hr tablet Commonly known as:  GLUCOPHAGE-XR Take 1,000 mg by mouth 2 (two) times daily.   metoprolol succinate 50 MG 24 hr tablet Commonly known as:  TOPROL-XL Take 50 mg by mouth daily.   senna-docusate 8.6-50 MG tablet Commonly known as:  Senokot-S Take 2 tablets by mouth 2 (two) times daily.   trospium 20 MG tablet Commonly known as:  SANCTURA Take 20 mg by mouth 2 (two) times daily.      Major procedures and Radiology Reports - PLEASE review detailed and final reports for all details, in brief -   Dg Chest 2 View  Result Date: 01/14/2019 CLINICAL DATA:  Acute shortness of breath. EXAM: CHEST - 2 VIEW COMPARISON:  07/28/2018 and prior radiographs FINDINGS: Cardiomegaly and increased vascularity again noted. Mild peribronchial thickening is unchanged. There is no evidence of focal airspace disease, pulmonary edema, suspicious pulmonary nodule/mass, pleural effusion, or pneumothorax. No acute bony abnormalities are identified. IMPRESSION: Cardiomegaly without evidence of acute cardiopulmonary disease. Increased pulmonary vascularity and mild chronic peribronchial thickening again noted. Electronically Signed   By: Margarette Canada M.D.   On: 01/14/2019 15:04   Ct Abdomen Pelvis W Contrast  Result Date: 01/14/2019 CLINICAL DATA:  Abdominal pain. EXAM: CT ABDOMEN AND PELVIS WITH CONTRAST TECHNIQUE: Multidetector CT imaging of the abdomen and pelvis was performed using the standard protocol following bolus administration of intravenous contrast. CONTRAST:  166m OMNIPAQUE IOHEXOL 300 MG/ML  SOLN COMPARISON:  05/02/2015 FINDINGS: Lower chest: No acute abnormality. Similar appearance of multiple hyperdense lymph nodes  within the mediastinum and hilar regions which may be the sequelae of prior granulomatous disease. Hepatobiliary: The caudate lobe and lateral segment of left lobe of liver appear enlarged. No focal liver abnormality identified. The gallbladder is normal. No biliary dilatation. Pancreas: Unremarkable. No pancreatic ductal dilatation or surrounding inflammatory changes. Spleen: Normal in size without focal abnormality. Adrenals/Urinary Tract: Normal appearance of the adrenal glands. No kidney mass or hydronephrosis identified. The urinary bladder appears within normal limits. Stomach/Bowel: The stomach appears distended. The proximal small bowel loops are dilated measuring up to 4 cm in diameter. Fecalization of the dilated mid small bowel loops noted. Within the right upper quadrant of the abdomen there is a transition point to decreased caliber distal small bowel, image 34/5, and image 43/3. A large stool burden is identified throughout the colon which may reflect constipation. Vascular/Lymphatic: Aortic atherosclerosis without aneurysm. The portal vein appears patent. No adenopathy. Reproductive: Prostate is unremarkable. Other: No free fluid or fluid collection within the abdomen or pelvis. Musculoskeletal: Spondylosis within the thoracolumbar spine. No aggressive lytic or sclerotic bone lesions identified. IMPRESSION: 1. Small bowel obstruction. A transition point is identified within the right upper quadrant of the abdomen. Findings may reflect sequelae of adhesions. No mass identified. 2. Large stool burden within the colon which may reflect constipation. 3.  Aortic Atherosclerosis (ICD10-I70.0). 4. Morphologic features of the liver which may reflect early cirrhosis. Electronically Signed   By: TKerby MoorsM.D.   On: 01/14/2019 15:05   Micro Results   Today   Subjective    JVictorino Diketoday has no emesis, tolerating solid food well, had few BMs, complains of dyspepsia/heartburn        Patient  has been seen and examined prior to discharge   Objective   Blood pressure 136/80, pulse 84, temperature 98.4 F (36.9 C), temperature source Oral, resp. rate 20,  height _0  (1.702 m), weight 125.2 kg, SpO2 99 %.   Intake/Output Summary (Last 24 hours) at 01/18/2019 1651 Last data filed at 01/18/2019 1300 Gross per 24 hour  Intake 480 ml  Output -  Net 480 ml   Exam Gen:- Awake Alert, no acute distress obese HEENT:- [place].AT, No sclera icterus Nose- [place] 2 L/min Neck-Supple Neck,No JVD,.  Lungs-  CTAB , good air movement bilaterally  CV- S1, S2 normal, regular Abd-  +ve B.Sounds, Abd Soft, No tenderness, increased truncal adiposity Extremity/Skin:-Negative Homans,,   good pulses Psych-affect is appropriate, oriented x3 Neuro-generalized weakness, without new focal deficits, no tremors    Data Review   CBC w Diff:  Lab Results  Component Value Date   WBC 6.9 01/18/2019   HGB 12.5 (L) 01/18/2019   HCT 38.4 (L) 01/18/2019   PLT 284 01/18/2019    CMP:  Lab Results  Component Value Date   NA 138 01/18/2019   K 3.4 (L) 01/18/2019   CL 100 01/18/2019   CO2 27 01/18/2019   BUN 12 01/18/2019   CREATININE 0.58 (L) 01/18/2019   PROT 7.0 01/18/2019   ALBUMIN 3.7 01/18/2019   BILITOT 1.0 01/18/2019   ALKPHOS 43 01/18/2019   AST 16 01/18/2019   ALT 13 01/18/2019  . Total Discharge time is about 33 minutes  Roxan Hockey M.D on 01/18/2019 at 4:51 PM  Go to [email or web address] -  for contact info  [facility] - Office  [phone]

## 2019-01-19 LAB — GLUCOSE, CAPILLARY: Glucose-Capillary: 162 mg/dL — ABNORMAL HIGH (ref 70–99)

## 2019-01-19 MED ORDER — LOSARTAN POTASSIUM 100 MG PO TABS: 100.0000 mg | ORAL_TABLET | Freq: Every day | ORAL | 2 refills | Status: DC

## 2019-01-19 MED ORDER — METOPROLOL SUCCINATE ER 50 MG PO TB24: 50.0000 mg | ORAL_TABLET | Freq: Every day | ORAL | 3 refills | Status: AC

## 2019-01-19 MED ORDER — FUROSEMIDE 40 MG PO TABS: 40.0000 mg | ORAL_TABLET | Freq: Every day | ORAL | 3 refills | Status: DC

## 2019-01-19 NOTE — Progress Notes (Signed)
Patient Demographics:    Colton Jackson, is a 68 y.o. male, DOB - 01/30/1951, VOJ:[id number]  Admit date - 01/14/2019   Admitting Physician Pratik Darleen Crocker, DO  Outpatient Primary MD for the patient is Sharilyn Sites, MD  LOS - 5   Chief Complaint  Patient presents with  . Abdominal Pain        Subjective:    Colton Jackson today has no fevers, no emesis,  No chest pain,  Passing gas, patient was initially discharged home on 01/18/2019 patient was appealing his discharge, patient is now agreeable to go home as of 01/19/19  Assessment  & Plan :    Principal Problem:   SBO (small bowel obstruction) ([place]) Active Problems:   Type 2 diabetes mellitus without complication ([place])   Essential hypertension   Obesity hypoventilation syndrome ([place])   Chronic respiratory failure with hypoxia ([place])  Brief Narrative: Per HPI:  Colton Jacksonis a 68 y.o.malewith a history of hypertension, diabetes, chronic back pain, chronic respiratory failure on 2 L nasal cannula at home, obesity hypoventilatory syndrome, obstructive sleep apnea with CPAP use at night. Patient seen for abdominal pain that started earlier this morning, along with nausea pain located in the right upper quadrant. No fluctuation to the patient's pain: No improvement or worsening. Pain is fairly constant rated 5 out of 10. No other palliating or provoking factors. He has had longstanding history of constipation over 8 to 9 years. His last bowel movement was approximately a week PTA.  Patient was admitted with concerns for small bowel obstruction noted on imaging.   Plan:-  1. Partial small bowel obstruction- clinically resolved,appreciate general surgery recommendations with diet advancement , had enemas and laxatives, tolerated full liquid diet on 01/17/2019, had quite a bit of bowel movements, tolerating solid food on 01/18/2019 without emesis,  continues to tolerate solid food well, continues to pass lots of gas  2. Type 2 diabetes-stable, Resume home metformin 1000 mg twice daily and glipizide for blood glucose control as patient is back on solid food   3. hypertension.Stable,Resume usual home BP agents including amlodipine 10 mg daily, clonidine 0.2 mg 3 times daily, metoprolol XL 50 mg daily, losartan 100 mg daily   4. Obesity hypoventilation syndrome/OSA--- Maintain on CPAP at night.   5. Chronic respiratory failure with hypoxemia--- continue home oxygen at 2 to 3 L continuously, oxygen requirement is currently at patient's baseline, no acute exacerbation of his chronic hypoxic respiratory failure at this time  6) Small bowel obstruction/anorexia/Unexplained 50 pound weight loss----patient has heartburn/dyspepsia, last colonoscopy according to patient was in 2010, patient is strongly advised to follow-up with  physician-Dr. Arnoldo Morale for EGD and colonoscopy to rule out underlying malignancy  7)Generalized weakness/debility-posthospitalization weakness----advised home health physical therapy and RN  8)Dispo--- patient was initially discharged home on 01/18/2019 patient was appealing his discharge, patient is now agreeable to go home as of 01/19/19   Consultants:  General surgery  Discharge Condition: stable  Follow UP  Disposition- 1)Soft diet advised 2)Take medications as prescribed 3)Please follow-up with Dr. Arnoldo Morale in a couple of weeks the general Surgeon for EGD/Upper Endoscopy as well as colonoscopy--- you have had unexplained weight loss, poor appetite, bowel Obstruction and Dyspepsia/Reflux symptoms--- we need to figure  out the reason for your weight loss and gastrointestinal symptoms, we need to make sure you do not have cancer  Code Status : FULL   Family Communication:   na   Disposition Plan  : home with [facility]  Consults  :  Gen surg   Lab Results  Component Value Date   PLT 284 01/18/2019     Inpatient Medications  Scheduled Meds: . amLODipine  10 mg Oral Daily  . aspirin EC  162 mg Oral Daily  . cloNIDine  0.2 mg Oral TID  . darifenacin  7.5 mg Oral Daily  . enoxaparin (LOVENOX) injection  0.5 mg/kg Subcutaneous Q24H  . famotidine  20 mg Oral BID  . famotidine  20 mg Oral Once  . glipiZIDE  10 mg Oral BID  . insulin aspart  0-15 Units Subcutaneous Q6H  . latanoprost  1 drop Both Eyes QHS  . losartan  100 mg Oral Daily  . metFORMIN  1,000 mg Oral BID  . metoprolol succinate  50 mg Oral Daily  . polyethylene glycol  17 g Oral BID   Continuous Infusions: PRN Meds:.alum & mag hydroxide-simeth, bisacodyl, hydrALAZINE, HYDROcodone-acetaminophen, meclizine, morphine injection, ondansetron (ZOFRAN) IV, promethazine    Anti-infectives (From admission, onward)   None        Objective:   Vitals:   01/18/19 1935 01/18/19 1936 01/18/19 2211 01/19/19 0517  BP:   (!) 148/77 138/72  Pulse:   78 76  Resp:   16 18  Temp:   98.7 F (37.1 C) 98.2 F (36.8 C)  TempSrc:   Oral Oral  SpO2: 98% 98% 97% 97%  Weight:      Height:        Wt Readings from Last 3 Encounters:  01/14/19 125.2 kg  01/13/19 128.2 kg  09/15/18 131.3 kg     Intake/Output Summary (Last 24 hours) at 01/19/2019 1039 Last data filed at 01/19/2019 0900 Gross per 24 hour  Intake 720 ml  Output -  Net 720 ml    Physical Exam Patient is examined daily including today on 01/19/2019 , exams remain the same as of yesterday except that has changed   Gen:- Awake Alert, no acute distress obese HEENT:- [place].AT, No sclera icterus Nose- [place] 2 L/min Neck-Supple Neck,No JVD,.  Lungs-  CTAB , good air movement bilaterally  CV- S1, S2 normal, regular Abd-  +ve B.Sounds, Abd Soft, No tenderness, increased truncal adiposity Extremity/Skin:-Negative Homans,,   good pulses Psych-affect is appropriate, oriented x3 Neuro-generalized weakness, without new focal deficits, no tremors    Data Review:   Micro  Results No results found for this or any previous visit (from the past 240 hour(s)).  Radiology Reports Dg Chest 2 View  Result Date: 01/14/2019 CLINICAL DATA:  Acute shortness of breath. EXAM: CHEST - 2 VIEW COMPARISON:  07/28/2018 and prior radiographs FINDINGS: Cardiomegaly and increased vascularity again noted. Mild peribronchial thickening is unchanged. There is no evidence of focal airspace disease, pulmonary edema, suspicious pulmonary nodule/mass, pleural effusion, or pneumothorax. No acute bony abnormalities are identified. IMPRESSION: Cardiomegaly without evidence of acute cardiopulmonary disease. Increased pulmonary vascularity and mild chronic peribronchial thickening again noted. Electronically Signed   By: Margarette Canada M.D.   On: 01/14/2019 15:04   Ct Abdomen Pelvis W Contrast  Result Date: 01/14/2019 CLINICAL DATA:  Abdominal pain. EXAM: CT ABDOMEN AND PELVIS WITH CONTRAST TECHNIQUE: Multidetector CT imaging of the abdomen and pelvis was performed using the standard protocol following bolus administration of  intravenous contrast. CONTRAST:  166m OMNIPAQUE IOHEXOL 300 MG/ML  SOLN COMPARISON:  05/02/2015 FINDINGS: Lower chest: No acute abnormality. Similar appearance of multiple hyperdense lymph nodes within the mediastinum and hilar regions which may be the sequelae of prior granulomatous disease. Hepatobiliary: The caudate lobe and lateral segment of left lobe of liver appear enlarged. No focal liver abnormality identified. The gallbladder is normal. No biliary dilatation. Pancreas: Unremarkable. No pancreatic ductal dilatation or surrounding inflammatory changes. Spleen: Normal in size without focal abnormality. Adrenals/Urinary Tract: Normal appearance of the adrenal glands. No kidney mass or hydronephrosis identified. The urinary bladder appears within normal limits. Stomach/Bowel: The stomach appears distended. The proximal small bowel loops are dilated measuring up to 4 cm in diameter.  Fecalization of the dilated mid small bowel loops noted. Within the right upper quadrant of the abdomen there is a transition point to decreased caliber distal small bowel, image 34/5, and image 43/3. A large stool burden is identified throughout the colon which may reflect constipation. Vascular/Lymphatic: Aortic atherosclerosis without aneurysm. The portal vein appears patent. No adenopathy. Reproductive: Prostate is unremarkable. Other: No free fluid or fluid collection within the abdomen or pelvis. Musculoskeletal: Spondylosis within the thoracolumbar spine. No aggressive lytic or sclerotic bone lesions identified. IMPRESSION: 1. Small bowel obstruction. A transition point is identified within the right upper quadrant of the abdomen. Findings may reflect sequelae of adhesions. No mass identified. 2. Large stool burden within the colon which may reflect constipation. 3.  Aortic Atherosclerosis (ICD10-I70.0). 4. Morphologic features of the liver which may reflect early cirrhosis. Electronically Signed   By: TKerby MoorsM.D.   On: 01/14/2019 15:05     CBC Recent Labs  Lab 01/14/19 1322 01/15/19 0722 01/18/19 0506  WBC 12.9* 9.2 6.9  HGB 14.2 13.7 12.5*  HCT 43.8 42.2 38.4*  PLT 320 319 284  MCV 96.1 96.3 94.3  MCH 31.1 31.3 30.7  MCHC 32.4 32.5 32.6  RDW 13.3 13.2 13.1    Chemistries  Recent Labs  Lab 01/13/19 1523 01/14/19 1322 01/15/19 0722 01/17/19 0548 01/18/19 0506  NA 139 141 140 138 138  K 3.8 3.8 3.8 3.0* 3.4*  CL 102 99 103 99 100  CO2 _0 GLUCOSE 141* 174* 150* 127* 119*  BUN _1 CREATININE 0.95 0.83 0.75 0.66 0.58*  CALCIUM 9.4 9.3 9.2 9.2 8.9  MG  --   --   --   --  1.9  AST  --  24  --  17 16  ALT  --  15  --  13 13  ALKPHOS  --  59  --  52 43  BILITOT  --  0.6  --  0.9 1.0   ------------------------------------------------------------------------------------------------------------------ No results for input(s): CHOL, HDL, LDLCALC,  TRIG, CHOLHDL, LDLDIRECT in the last 72 hours.  No results found for: HGBA1C ------------------------------------------------------------------------------------------------------------------ No results for input(s): TSH, T4TOTAL, T3FREE, THYROIDAB in the last 72 hours.  Invalid input(s): FREET3 ------------------------------------------------------------------------------------------------------------------ No results for input(s): VITAMINB12, FOLATE, FERRITIN, TIBC, IRON, RETICCTPCT in the last 72 hours.  Coagulation profile No results for input(s): INR, PROTIME in the last 168 hours.  No results for input(s): DDIMER in the last 72 hours.  Cardiac Enzymes No results for input(s): CKMB, TROPONINI, MYOGLOBIN in the last 168 hours.  Invalid input(s): CK ------------------------------------------------------------------------------------------------------------------    Component Value Date/Time   BNP 20.5 10/09/2017 0758   patient was initially discharged home on 01/18/2019 patient was appealing his  discharge, patient is now agreeable to go home as of 01/19/19    Roxan Hockey M.D on 01/19/2019 at 10:39 AM  Go to [email or web address] - for contact info  [facility] - Office  [phone]

## 2019-01-19 NOTE — Progress Notes (Signed)
  Subjective: Patient states he gets some fullness at the end of a meal.  He is still moving his bowels and passing flatus.  He is concerned that he wants to get a colonoscopy because it is been 10 years.  Objective: Vital signs in last 24 hours: Temp:  [98.2 F (36.8 C)-98.7 F (37.1 C)] 98.2 F (36.8 C) (03/05 0517) Pulse Rate:  [76-84] 76 (03/05 0517) Resp:  [16-20] 18 (03/05 0517) BP: (136-148)/(72-80) 138/72 (03/05 0517) SpO2:  [97 %-99 %] 97 % (03/05 0517) Last BM Date: 01/18/19  Intake/Output from previous day: 03/04 0701 - 03/05 0700 In: 720 [P.O.:720] Out: -  Intake/Output this shift: Total I/O In: 240 [P.O.:240] Out: -   General appearance: alert, cooperative and no distress GI: soft, non-tender; bowel sounds normal; no masses,  no organomegaly  Lab Results:  Recent Labs    01/18/19 0506  WBC 6.9  HGB 12.5*  HCT 38.4*  PLT 284   BMET Recent Labs    01/17/19 0548 01/18/19 0506  NA 138 138  K 3.0* 3.4*  CL 99 100  CO2 25 27  GLUCOSE 127* 119*  BUN 15 12  CREATININE 0.66 0.58*  CALCIUM 9.2 8.9   PT/INR No results for input(s): LABPROT, INR in the last 72 hours.  Studies/Results: No results found.  Anti-infectives: Anti-infectives (From admission, onward)   None      Assessment/Plan: Impression: Partial small bowel obstruction, resolved Plan: Would just charge patient today.  I will see the patient in 2 weeks for follow-up colonoscopy and possible EGD.  LOS: 5 days    Aviva Signs 01/19/2019

## 2019-01-19 NOTE — Progress Notes (Signed)
Reviewed d/c paperwork with patient. Discussed new medications and med changes. Patient asked me to change pharmacies so I asked the doctor to change pharmacy to [facility] on [address]. I answered all questions.NT wheeled stable patient and belongings to main entrance where he was picked up by his son to d/c to home.

## 2019-01-19 NOTE — Care Management Note (Signed)
Case Management Note  Patient Details  Name: Colton Jackson. MRN: [id number] Date of Birth: 03-02-1951  Subjective/Objective:    Admitted with SBO. Pt from home, lives alone, as family support. Pt has home O2 pta. Pt DC's yesterday with referral for [facility] but appealed DC, stating he did not feel well enough to leave. Okay with DC today. Pt has insurance and PCP. Pt has used [facility] in the past. CMS provider list given and pt chooses [facility] again. Aware HH has 48 hrs to make first visit.                 Action/Plan: DC home today with HH. Dorian Pod, [facility] rep, given referral.   Expected Discharge Date:  01/19/19               Expected Discharge Plan:  [place]  In-House Referral:  NA  Discharge planning Services  CM Consult  Post Acute Care Choice:  Home Health Choice offered to:  Patient  HH Arranged:  RN, PT [facility] Agency:  [facility]  Status of Service:  Completed, signed off Sherald Barge, RN 01/19/2019, 11:17 AM

## 2019-01-19 NOTE — Discharge Instructions (Signed)
1)Soft diet advised 2)Take medications as prescribed 3)Please follow-up with Dr. Arnoldo Morale in a couple of weeks the general Surgeon for EGD/Upper Endoscopy as well as colonoscopy--- you have had unexplained weight loss, poor appetite, bowel Obstruction and Dyspepsia/Reflux symptoms--- we need to figure out the reason for your weight loss and gastrointestinal symptoms, we need to make sure you do not have cancer

## 2019-01-20 ENCOUNTER — Ambulatory Visit: Payer: Medicare Other | Admitting: Urology

## 2019-01-21 DIAGNOSIS — I110 Hypertensive heart disease with heart failure: Secondary | ICD-10-CM | POA: Diagnosis not present

## 2019-01-21 DIAGNOSIS — Z9181 History of falling: Secondary | ICD-10-CM | POA: Diagnosis not present

## 2019-01-21 DIAGNOSIS — Z87891 Personal history of nicotine dependence: Secondary | ICD-10-CM | POA: Diagnosis not present

## 2019-01-21 DIAGNOSIS — Z794 Long term (current) use of insulin: Secondary | ICD-10-CM | POA: Diagnosis not present

## 2019-01-21 DIAGNOSIS — Z6841 Body Mass Index (BMI) 40.0 and over, adult: Secondary | ICD-10-CM | POA: Diagnosis not present

## 2019-01-21 DIAGNOSIS — M5136 Other intervertebral disc degeneration, lumbar region: Secondary | ICD-10-CM | POA: Diagnosis not present

## 2019-01-21 DIAGNOSIS — Z7982 Long term (current) use of aspirin: Secondary | ICD-10-CM | POA: Diagnosis not present

## 2019-01-21 DIAGNOSIS — I5032 Chronic diastolic (congestive) heart failure: Secondary | ICD-10-CM | POA: Diagnosis not present

## 2019-01-21 DIAGNOSIS — G4733 Obstructive sleep apnea (adult) (pediatric): Secondary | ICD-10-CM | POA: Diagnosis not present

## 2019-01-21 DIAGNOSIS — I272 Pulmonary hypertension, unspecified: Secondary | ICD-10-CM | POA: Diagnosis not present

## 2019-01-21 DIAGNOSIS — E119 Type 2 diabetes mellitus without complications: Secondary | ICD-10-CM | POA: Diagnosis not present

## 2019-01-21 DIAGNOSIS — J9611 Chronic respiratory failure with hypoxia: Secondary | ICD-10-CM | POA: Diagnosis not present

## 2019-01-21 DIAGNOSIS — E662 Morbid (severe) obesity with alveolar hypoventilation: Secondary | ICD-10-CM | POA: Diagnosis not present

## 2019-01-23 ENCOUNTER — Other Ambulatory Visit (HOSPITAL_COMMUNITY): Payer: Self-pay | Admitting: Cardiology

## 2019-01-23 DIAGNOSIS — K56609 Unspecified intestinal obstruction, unspecified as to partial versus complete obstruction: Secondary | ICD-10-CM | POA: Diagnosis not present

## 2019-01-23 DIAGNOSIS — I110 Hypertensive heart disease with heart failure: Secondary | ICD-10-CM | POA: Diagnosis not present

## 2019-01-23 DIAGNOSIS — Z6841 Body Mass Index (BMI) 40.0 and over, adult: Secondary | ICD-10-CM | POA: Diagnosis not present

## 2019-01-23 DIAGNOSIS — G894 Chronic pain syndrome: Secondary | ICD-10-CM | POA: Diagnosis not present

## 2019-01-23 DIAGNOSIS — J9611 Chronic respiratory failure with hypoxia: Secondary | ICD-10-CM | POA: Diagnosis not present

## 2019-01-23 DIAGNOSIS — E109 Type 1 diabetes mellitus without complications: Secondary | ICD-10-CM | POA: Diagnosis not present

## 2019-01-23 DIAGNOSIS — I10 Essential (primary) hypertension: Secondary | ICD-10-CM | POA: Diagnosis not present

## 2019-01-23 DIAGNOSIS — J449 Chronic obstructive pulmonary disease, unspecified: Secondary | ICD-10-CM | POA: Diagnosis not present

## 2019-01-23 DIAGNOSIS — I739 Peripheral vascular disease, unspecified: Secondary | ICD-10-CM

## 2019-01-23 DIAGNOSIS — M5136 Other intervertebral disc degeneration, lumbar region: Secondary | ICD-10-CM | POA: Diagnosis not present

## 2019-01-23 DIAGNOSIS — E119 Type 2 diabetes mellitus without complications: Secondary | ICD-10-CM | POA: Diagnosis not present

## 2019-01-23 DIAGNOSIS — I272 Pulmonary hypertension, unspecified: Secondary | ICD-10-CM | POA: Diagnosis not present

## 2019-01-23 DIAGNOSIS — E785 Hyperlipidemia, unspecified: Secondary | ICD-10-CM | POA: Diagnosis not present

## 2019-01-23 DIAGNOSIS — I5032 Chronic diastolic (congestive) heart failure: Secondary | ICD-10-CM | POA: Diagnosis not present

## 2019-01-24 ENCOUNTER — Other Ambulatory Visit: Payer: Self-pay | Admitting: *Deleted

## 2019-01-24 DIAGNOSIS — I272 Pulmonary hypertension, unspecified: Secondary | ICD-10-CM | POA: Diagnosis not present

## 2019-01-24 DIAGNOSIS — Z6841 Body Mass Index (BMI) 40.0 and over, adult: Secondary | ICD-10-CM | POA: Diagnosis not present

## 2019-01-24 DIAGNOSIS — E109 Type 1 diabetes mellitus without complications: Secondary | ICD-10-CM | POA: Diagnosis not present

## 2019-01-24 DIAGNOSIS — I110 Hypertensive heart disease with heart failure: Secondary | ICD-10-CM | POA: Diagnosis not present

## 2019-01-24 DIAGNOSIS — I5032 Chronic diastolic (congestive) heart failure: Secondary | ICD-10-CM | POA: Diagnosis not present

## 2019-01-24 DIAGNOSIS — E119 Type 2 diabetes mellitus without complications: Secondary | ICD-10-CM | POA: Diagnosis not present

## 2019-01-24 DIAGNOSIS — K56609 Unspecified intestinal obstruction, unspecified as to partial versus complete obstruction: Secondary | ICD-10-CM | POA: Diagnosis not present

## 2019-01-24 DIAGNOSIS — J9611 Chronic respiratory failure with hypoxia: Secondary | ICD-10-CM | POA: Diagnosis not present

## 2019-01-24 DIAGNOSIS — M5136 Other intervertebral disc degeneration, lumbar region: Secondary | ICD-10-CM | POA: Diagnosis not present

## 2019-01-24 NOTE — Patient Outreach (Addendum)
[place] [facility]) Care Management  01/24/2019  Colton Jackson. 1951/03/22 [id number]   Subjective: Telephone call to patient's home, spoke with patient, and HIPAA verified.  Discussed [facility] Care Management Medicare EMMI General Discharge follow up, patient voiced understanding, and is in agreement to follow up.  Patient states he is doing much better, remembers receiving the EMMI automated calls, and system may have captured the wrong answer.   Patient states he does know who to call for changes in his condition, is aware of signs / symptoms to report to MD, and is aware of how to contact providers after hours.  Patient states he had a follow up visit with primary MD on 01/23/2019, was advised to discontinue blood pressure medications, blood pressure was 130/63 on 01/23/2019, and will not refill blood pressure medication per MD order.  States elevated blood pressure during most recent hospitalization was due to pain. Patient had question regarding if alternative medication to Pepcid since this medication is not working for him, RNCM advised patient to contact primary MD regarding his question, patient voices understanding, and states he will follow up with primary MD.   Patient states he does not have any education material, EMMI follow up, care coordination, care management, disease monitoring, transportation, community resource, or pharmacy needs at this time.   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     Objective: Per KPN (Knowledge Performance Now, point of care tool) and chart review, patient hospitalized  01/14/2019 -01/19/2019 for small bowel obstruction.   Patient also has a history of diabetes, hypertension, Obesity hypoventilation syndrome , Degenerative disk disease, and Chronic respiratory failure with hypoxia.       Assessment: Received Medicare EMMI General Discharge Red Flag  Alert follow up referral on 01/23/2019.  Red Flag Alert trigger times 3, Day #1, patient answered no to the following questions: Know who to call about changes in condition?   Able to fill today/tomorrow?  Patient answered yes to the following question: Unfilled prescriptions?     EMMI follow up completed and no further care management needs.     Plan: RNCM will complete case closure due to follow up completed / no care management needs.     Reita Shindler H. Annia Friendly, BSN, [place] Management [facility] Telephonic CM Phone: [phone] Fax: [phone]

## 2019-01-25 ENCOUNTER — Encounter: Payer: Self-pay | Admitting: Internal Medicine

## 2019-01-25 ENCOUNTER — Other Ambulatory Visit: Payer: Self-pay | Admitting: *Deleted

## 2019-01-25 NOTE — Patient Outreach (Signed)
[place] [facility]) Care Management  01/25/2019  Colton Jackson. 05-30-1951 [id number]  Subjective: Telephone call to patient's home number, no answer, left HIPAA compliant voicemail message, and requested call back.    Objective: Per KPN (Knowledge Performance Now, point of care tool) and chart review, patient hospitalized  01/14/2019 -01/19/2019 for small bowel obstruction.   Patient also has a history of diabetes, hypertension, Obesity hypoventilation syndrome , Degenerative disk disease, and Chronic respiratory failure with hypoxia.       Assessment: Received Medicare EMMI General Discharge Red Flag Alert follow up referral on 01/25/2019.  Red Flag Alert trigger times 2, Day # 4, patient answered no to the following question:   Able to fill today/tomorrow?  Patient answered yes to the following question: Other questions/problems?   [facility] EMMI follow up pending patient contact.     Plan: RNCM will send unsuccessful outreach letter, [facility] pamphlet, handout: Know Before You Go, will call patient for 2nd telephone outreach attempt within 4 business days, [facility] EMMI follow up, and proceed with case closure, within 10 business days if no return call.      Shyteria Lewis H. Annia Friendly, BSN, [place] Management [facility] Telephonic CM Phone: [phone] Fax: [phone]

## 2019-01-26 ENCOUNTER — Other Ambulatory Visit: Payer: Self-pay | Admitting: *Deleted

## 2019-01-26 DIAGNOSIS — J9611 Chronic respiratory failure with hypoxia: Secondary | ICD-10-CM | POA: Diagnosis not present

## 2019-01-26 DIAGNOSIS — I272 Pulmonary hypertension, unspecified: Secondary | ICD-10-CM | POA: Diagnosis not present

## 2019-01-26 DIAGNOSIS — M5136 Other intervertebral disc degeneration, lumbar region: Secondary | ICD-10-CM | POA: Diagnosis not present

## 2019-01-26 DIAGNOSIS — I110 Hypertensive heart disease with heart failure: Secondary | ICD-10-CM | POA: Diagnosis not present

## 2019-01-26 DIAGNOSIS — E119 Type 2 diabetes mellitus without complications: Secondary | ICD-10-CM | POA: Diagnosis not present

## 2019-01-26 DIAGNOSIS — I5032 Chronic diastolic (congestive) heart failure: Secondary | ICD-10-CM | POA: Diagnosis not present

## 2019-01-26 NOTE — Patient Outreach (Signed)
[place] [facility]) Care Management  01/26/2019  Colton Jackson. Jul 22, 1951 [id number]   Subjective: Received voicemail message from Victorino Dike, states he is returning call, and requested call back. Telephone call to patient's home number, spoke with patient, and HIPAA verified.  Discussed [facility] Care Management Medicare EMMI General Discharge follow up, patient voiced understanding, and is in agreement to follow up.  Patient states he remembers speaking with this RNCM on 01/24/2019, has no issues with unfilled medications, and all questions have been answered.   States the Pepcid [facility] is working for his stomach and will follow up with his primary MD is anything changes.  Patient states he does not have any education material, EMMI follow up, care coordination, care management, disease monitoring, transportation, community resource, or pharmacy needs at this time.  States he is very appreciative of the follow up.     Objective:Per KPN (Knowledge Performance Now, point of care tool) and chart review,patient hospitalized 01/14/2019 -01/19/2019 for small bowel obstruction. Patient also has a history of diabetes, hypertension, Obesity hypoventilation syndrome,Degenerative disk disease, andChronic respiratory failure with hypoxia.     Assessment: Received Medicare EMMI General Discharge Red Flag Alert follow up referral on 01/25/2019. Red Flag Alert trigger times 2, Day # 4, patient answered no to the following question: Able to fill today/tomorrow?Patient answered yes to the following question:Other questions/problems?EMMI follow up completed and no further care management needs.     Plan:RNCM will complete case closure due to follow up completed / no care management needs.      Hibah Odonnell H. Annia Friendly, BSN, [place] Management [facility] Telephonic CM Phone: [phone] Fax: [phone]

## 2019-01-27 ENCOUNTER — Other Ambulatory Visit (HOSPITAL_COMMUNITY): Payer: Self-pay | Admitting: Cardiology

## 2019-01-31 DIAGNOSIS — I5032 Chronic diastolic (congestive) heart failure: Secondary | ICD-10-CM | POA: Diagnosis not present

## 2019-01-31 DIAGNOSIS — I110 Hypertensive heart disease with heart failure: Secondary | ICD-10-CM | POA: Diagnosis not present

## 2019-01-31 DIAGNOSIS — E119 Type 2 diabetes mellitus without complications: Secondary | ICD-10-CM | POA: Diagnosis not present

## 2019-01-31 DIAGNOSIS — M5136 Other intervertebral disc degeneration, lumbar region: Secondary | ICD-10-CM | POA: Diagnosis not present

## 2019-01-31 DIAGNOSIS — I272 Pulmonary hypertension, unspecified: Secondary | ICD-10-CM | POA: Diagnosis not present

## 2019-01-31 DIAGNOSIS — J9611 Chronic respiratory failure with hypoxia: Secondary | ICD-10-CM | POA: Diagnosis not present

## 2019-02-02 DIAGNOSIS — I5032 Chronic diastolic (congestive) heart failure: Secondary | ICD-10-CM | POA: Diagnosis not present

## 2019-02-02 DIAGNOSIS — E119 Type 2 diabetes mellitus without complications: Secondary | ICD-10-CM | POA: Diagnosis not present

## 2019-02-02 DIAGNOSIS — M5136 Other intervertebral disc degeneration, lumbar region: Secondary | ICD-10-CM | POA: Diagnosis not present

## 2019-02-02 DIAGNOSIS — I110 Hypertensive heart disease with heart failure: Secondary | ICD-10-CM | POA: Diagnosis not present

## 2019-02-02 DIAGNOSIS — I272 Pulmonary hypertension, unspecified: Secondary | ICD-10-CM | POA: Diagnosis not present

## 2019-02-02 DIAGNOSIS — J9611 Chronic respiratory failure with hypoxia: Secondary | ICD-10-CM | POA: Diagnosis not present

## 2019-02-07 DIAGNOSIS — J9611 Chronic respiratory failure with hypoxia: Secondary | ICD-10-CM | POA: Diagnosis not present

## 2019-02-07 DIAGNOSIS — E119 Type 2 diabetes mellitus without complications: Secondary | ICD-10-CM | POA: Diagnosis not present

## 2019-02-07 DIAGNOSIS — M5136 Other intervertebral disc degeneration, lumbar region: Secondary | ICD-10-CM | POA: Diagnosis not present

## 2019-02-07 DIAGNOSIS — I5032 Chronic diastolic (congestive) heart failure: Secondary | ICD-10-CM | POA: Diagnosis not present

## 2019-02-07 DIAGNOSIS — I110 Hypertensive heart disease with heart failure: Secondary | ICD-10-CM | POA: Diagnosis not present

## 2019-02-07 DIAGNOSIS — I272 Pulmonary hypertension, unspecified: Secondary | ICD-10-CM | POA: Diagnosis not present

## 2019-02-09 DIAGNOSIS — M5136 Other intervertebral disc degeneration, lumbar region: Secondary | ICD-10-CM | POA: Diagnosis not present

## 2019-02-09 DIAGNOSIS — I5032 Chronic diastolic (congestive) heart failure: Secondary | ICD-10-CM | POA: Diagnosis not present

## 2019-02-09 DIAGNOSIS — E119 Type 2 diabetes mellitus without complications: Secondary | ICD-10-CM | POA: Diagnosis not present

## 2019-02-09 DIAGNOSIS — J9611 Chronic respiratory failure with hypoxia: Secondary | ICD-10-CM | POA: Diagnosis not present

## 2019-02-09 DIAGNOSIS — I110 Hypertensive heart disease with heart failure: Secondary | ICD-10-CM | POA: Diagnosis not present

## 2019-02-09 DIAGNOSIS — I272 Pulmonary hypertension, unspecified: Secondary | ICD-10-CM | POA: Diagnosis not present

## 2019-02-13 ENCOUNTER — Inpatient Hospital Stay (HOSPITAL_COMMUNITY): Admit: 2019-02-13 | Payer: Medicare Other

## 2019-02-14 DIAGNOSIS — J9611 Chronic respiratory failure with hypoxia: Secondary | ICD-10-CM | POA: Diagnosis not present

## 2019-02-14 DIAGNOSIS — I110 Hypertensive heart disease with heart failure: Secondary | ICD-10-CM | POA: Diagnosis not present

## 2019-02-14 DIAGNOSIS — E119 Type 2 diabetes mellitus without complications: Secondary | ICD-10-CM | POA: Diagnosis not present

## 2019-02-14 DIAGNOSIS — I272 Pulmonary hypertension, unspecified: Secondary | ICD-10-CM | POA: Diagnosis not present

## 2019-02-14 DIAGNOSIS — M5136 Other intervertebral disc degeneration, lumbar region: Secondary | ICD-10-CM | POA: Diagnosis not present

## 2019-02-14 DIAGNOSIS — I5032 Chronic diastolic (congestive) heart failure: Secondary | ICD-10-CM | POA: Diagnosis not present

## 2019-02-16 DIAGNOSIS — J9611 Chronic respiratory failure with hypoxia: Secondary | ICD-10-CM | POA: Diagnosis not present

## 2019-02-16 DIAGNOSIS — I272 Pulmonary hypertension, unspecified: Secondary | ICD-10-CM | POA: Diagnosis not present

## 2019-02-16 DIAGNOSIS — E119 Type 2 diabetes mellitus without complications: Secondary | ICD-10-CM | POA: Diagnosis not present

## 2019-02-16 DIAGNOSIS — I5032 Chronic diastolic (congestive) heart failure: Secondary | ICD-10-CM | POA: Diagnosis not present

## 2019-02-16 DIAGNOSIS — M5136 Other intervertebral disc degeneration, lumbar region: Secondary | ICD-10-CM | POA: Diagnosis not present

## 2019-02-16 DIAGNOSIS — I110 Hypertensive heart disease with heart failure: Secondary | ICD-10-CM | POA: Diagnosis not present

## 2019-02-21 ENCOUNTER — Telehealth: Payer: Self-pay | Admitting: Internal Medicine

## 2019-02-22 NOTE — Telephone Encounter (Signed)
Error - no details on message.  Will close out.

## 2019-02-28 DIAGNOSIS — Z1389 Encounter for screening for other disorder: Secondary | ICD-10-CM | POA: Diagnosis not present

## 2019-02-28 DIAGNOSIS — Z6841 Body Mass Index (BMI) 40.0 and over, adult: Secondary | ICD-10-CM | POA: Diagnosis not present

## 2019-02-28 DIAGNOSIS — G894 Chronic pain syndrome: Secondary | ICD-10-CM | POA: Diagnosis not present

## 2019-03-16 ENCOUNTER — Ambulatory Visit: Payer: Medicare Other | Admitting: Internal Medicine

## 2019-03-17 ENCOUNTER — Encounter (HOSPITAL_COMMUNITY): Payer: Medicare Other

## 2019-03-24 ENCOUNTER — Other Ambulatory Visit (HOSPITAL_COMMUNITY): Payer: Self-pay | Admitting: Cardiology

## 2019-03-27 ENCOUNTER — Other Ambulatory Visit (HOSPITAL_COMMUNITY): Payer: Self-pay | Admitting: Cardiology

## 2019-03-28 ENCOUNTER — Ambulatory Visit: Payer: Medicare Other | Admitting: Gastroenterology

## 2019-03-30 DIAGNOSIS — H43811 Vitreous degeneration, right eye: Secondary | ICD-10-CM | POA: Diagnosis not present

## 2019-03-30 DIAGNOSIS — E113391 Type 2 diabetes mellitus with moderate nonproliferative diabetic retinopathy without macular edema, right eye: Secondary | ICD-10-CM | POA: Diagnosis not present

## 2019-03-30 DIAGNOSIS — H401131 Primary open-angle glaucoma, bilateral, mild stage: Secondary | ICD-10-CM | POA: Diagnosis not present

## 2019-03-30 DIAGNOSIS — E113392 Type 2 diabetes mellitus with moderate nonproliferative diabetic retinopathy without macular edema, left eye: Secondary | ICD-10-CM | POA: Diagnosis not present

## 2019-04-11 DIAGNOSIS — K5903 Drug induced constipation: Secondary | ICD-10-CM | POA: Diagnosis not present

## 2019-04-11 DIAGNOSIS — G894 Chronic pain syndrome: Secondary | ICD-10-CM | POA: Diagnosis not present

## 2019-04-11 DIAGNOSIS — Z6841 Body Mass Index (BMI) 40.0 and over, adult: Secondary | ICD-10-CM | POA: Diagnosis not present

## 2019-04-17 ENCOUNTER — Encounter (HOSPITAL_COMMUNITY): Payer: Medicare Other

## 2019-04-25 ENCOUNTER — Ambulatory Visit (HOSPITAL_COMMUNITY)
Admission: RE | Admit: 2019-04-25 | Discharge: 2019-04-25 | Disposition: A | Discharge status: Home / Self Care | Payer: Medicare Other | Source: Ambulatory Visit | Attending: Internal Medicine | Admitting: Internal Medicine

## 2019-04-25 ENCOUNTER — Other Ambulatory Visit: Payer: Self-pay

## 2019-04-25 DIAGNOSIS — I739 Peripheral vascular disease, unspecified: Secondary | ICD-10-CM | POA: Diagnosis not present

## 2019-04-25 DIAGNOSIS — I5032 Chronic diastolic (congestive) heart failure: Secondary | ICD-10-CM | POA: Insufficient documentation

## 2019-05-11 DIAGNOSIS — Z6841 Body Mass Index (BMI) 40.0 and over, adult: Secondary | ICD-10-CM | POA: Diagnosis not present

## 2019-05-11 DIAGNOSIS — E785 Hyperlipidemia, unspecified: Secondary | ICD-10-CM | POA: Diagnosis not present

## 2019-05-11 DIAGNOSIS — M1991 Primary osteoarthritis, unspecified site: Secondary | ICD-10-CM | POA: Diagnosis not present

## 2019-05-11 DIAGNOSIS — G8929 Other chronic pain: Secondary | ICD-10-CM | POA: Diagnosis not present

## 2019-05-11 DIAGNOSIS — E109 Type 1 diabetes mellitus without complications: Secondary | ICD-10-CM | POA: Diagnosis not present

## 2019-05-11 DIAGNOSIS — J449 Chronic obstructive pulmonary disease, unspecified: Secondary | ICD-10-CM | POA: Diagnosis not present

## 2019-05-11 DIAGNOSIS — I10 Essential (primary) hypertension: Secondary | ICD-10-CM | POA: Diagnosis not present

## 2019-05-22 DIAGNOSIS — Z1211 Encounter for screening for malignant neoplasm of colon: Secondary | ICD-10-CM | POA: Diagnosis not present

## 2019-06-15 DIAGNOSIS — Z1389 Encounter for screening for other disorder: Secondary | ICD-10-CM | POA: Diagnosis not present

## 2019-06-15 DIAGNOSIS — Z6841 Body Mass Index (BMI) 40.0 and over, adult: Secondary | ICD-10-CM | POA: Diagnosis not present

## 2019-06-15 DIAGNOSIS — R198 Other specified symptoms and signs involving the digestive system and abdomen: Secondary | ICD-10-CM | POA: Diagnosis not present

## 2019-07-02 ENCOUNTER — Encounter: Payer: Self-pay | Admitting: Internal Medicine

## 2019-07-04 ENCOUNTER — Ambulatory Visit (INDEPENDENT_AMBULATORY_CARE_PROVIDER_SITE_OTHER): Payer: Medicare Other | Admitting: Internal Medicine

## 2019-07-04 ENCOUNTER — Encounter: Payer: Self-pay | Admitting: Internal Medicine

## 2019-07-04 ENCOUNTER — Other Ambulatory Visit: Payer: Self-pay

## 2019-07-04 VITALS — BP 122/70 | HR 91 | Temp 98.3°F | Ht 68.0 in | Wt 292.2 lb

## 2019-07-04 DIAGNOSIS — G4733 Obstructive sleep apnea (adult) (pediatric): Secondary | ICD-10-CM | POA: Diagnosis not present

## 2019-07-04 DIAGNOSIS — J9611 Chronic respiratory failure with hypoxia: Secondary | ICD-10-CM

## 2019-07-04 DIAGNOSIS — E662 Morbid (severe) obesity with alveolar hypoventilation: Secondary | ICD-10-CM

## 2019-07-04 DIAGNOSIS — R0609 Other forms of dyspnea: Secondary | ICD-10-CM

## 2019-07-04 LAB — CBC WITH DIFFERENTIAL/PLATELET
Basophils Absolute: 0 10*3/uL (ref 0.0–0.1)
Basophils Relative: 0.6 % (ref 0.0–3.0)
Eosinophils Absolute: 0.2 10*3/uL (ref 0.0–0.7)
Eosinophils Relative: 2.8 % (ref 0.0–5.0)
HCT: 40.4 % (ref 39.0–52.0)
Hemoglobin: 13.4 g/dL (ref 13.0–17.0)
Lymphocytes Relative: 28.4 % (ref 12.0–46.0)
Lymphs Abs: 1.9 10*3/uL (ref 0.7–4.0)
MCHC: 33.1 g/dL (ref 30.0–36.0)
MCV: 93.3 fl (ref 78.0–100.0)
Monocytes Absolute: 0.5 10*3/uL (ref 0.1–1.0)
Monocytes Relative: 7.2 % (ref 3.0–12.0)
Neutro Abs: 4 10*3/uL (ref 1.4–7.7)
Neutrophils Relative %: 61 % (ref 43.0–77.0)
Platelets: 274 10*3/uL (ref 150.0–400.0)
RBC: 4.33 Mil/uL (ref 4.22–5.81)
RDW: 14.4 % (ref 11.5–15.5)
WBC: 6.6 10*3/uL (ref 4.0–10.5)

## 2019-07-04 LAB — D-DIMER, QUANTITATIVE: D-Dimer, Quant: 0.66 mcg/mL FEU — ABNORMAL HIGH (ref <0.50)

## 2019-07-04 NOTE — Progress Notes (Signed)
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 Pt wears CPAP with O2 nightly. Also, continues to wear O2 during the daytime as well. DL attached.  Download 100% compliance AHI 0.7/hour Anoro Ellipta, V/Q scan wnl 07/28/18 Cardiology treated pulmonary hypertension by increasing amlodipine and Lasix. He asks about portable oxygen concentrator-discussed.Has not found Anoro helpful. CT chest 07/28/2018- IMPRESSION: 1. Nodule of concern on the prior examination in the left lower lobe has resolved indicative of a benign etiology. No suspicious pulmonary nodules or masses are noted on today's examination. 2. Aortic atherosclerosis, in addition to two vessel coronary artery disease. Please note that although the presence of coronary artery calcium documents the presence of coronary artery disease, the severity of this disease and any potential stenosis cannot be assessed on this non-gated CT examination. Assessment for potential risk factor modification, dietary therapy or pharmacologic therapy may be warranted, if clinically indicated. Aortic Atherosclerosis (ICD10-I70.0).  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 -----OSA on CPAP 19 and O2 2-3L continuous, DME: [employer]; no complaints Dyspneic with exertion walking to exam room, but sat stayed 97% on 3L. Covid mask is smothering in hot weather- discussed. Increased DOE in past month. No cough, wheeze, palpitation or definite fluid retention.Had leg artery dopplers. Now on antibiotic for sin infection at navel. CXR 01/14/2019- IMPRESSION: Cardiomegaly without evidence of acute cardiopulmonary disease. Increased pulmonary vascularity and mild chronic peribronchial thickening again noted.  ROS-see HPI + = positive Constitutional:   No-   weight loss, night sweats, fevers, chills, +fatigue, lassitude. HEENT:   No-  headaches, difficulty swallowing, tooth/dental problems, sore throat,       No-  sneezing, itching, ear ache, nasal congestion, post nasal drip,  CV:  No-   chest pain, orthopnea, PND, swelling in lower extremities, anasarca, dizziness, palpitations Resp: + shortness of breath with exertion or at rest.              No-   productive cough,  Occasional non-productive cough,  No-  coughing up of blood.              No-   change in color of mucus.  Rare wheezing.   Skin: No-   rash or lesions. GI:  No-   heartburn, indigestion, abdominal pain, nausea, vomiting,  GU:  MS:  +   joint pain or swelling., + back pain Neuro-  Psych:  No- change in mood or affect.  +depression or anxiety.  No memory loss.  OBJ- Physical Exam    O2 2  L General- Alert, Oriented, Affect-appropriate, Distress- none acute, +morbidly obese Skin- rash-none, lesions- none, excoriation-  none Lymphadenopathy- none Head- atraumatic            Eyes- Gross vision intact, PERRLA, conjunctivae and secretions clear            Ears- Hearing, canals-normal            Nose-  no-Septal dev, mucus+, No-polyps, erosion, perforation             Throat- Mallampati IV , mucosa clear , drainage- none, tonsils- atrophic, + mild hoarseness Neck- flexible , trachea midline, no stridor , thyroid nl, carotid no bruit Chest - symmetrical excursion , unlabored           Heart/CV- RRR , no murmur , no gallop  , no rub, nl s1 s2                           - JVD+ 1 cm, edema + elastic hose, stasis changes- , varices- none           Lung- clear to P&A, + shallow consistent with body habitus, wheeze- none, cough- none , dullness-none, rub- none           Chest wall-  Abd- Br/ Gen/ Rectal- Not done, not indicated Extrem- cyanosis- none, clubbing, none, atrophy- none, strength- nl, + elastic hose Neuro- grossly intact to observation

## 2019-07-04 NOTE — Patient Instructions (Addendum)
We can continue CPAP 19 and oxygen at 2-3 liters/ minute  Order- lab- CBC w diff, D-dimer    Dx dyspnea on exertion  Please call as needed

## 2019-07-05 ENCOUNTER — Telehealth: Payer: Self-pay | Admitting: Internal Medicine

## 2019-07-05 DIAGNOSIS — R0989 Other specified symptoms and signs involving the circulatory and respiratory systems: Secondary | ICD-10-CM

## 2019-07-05 DIAGNOSIS — R0609 Other forms of dyspnea: Secondary | ICD-10-CM

## 2019-07-05 NOTE — Telephone Encounter (Signed)
Called and spoke with patient regarding his D-Dimer lab results and recommendations per CY. Pt verbalized understanding and is aware to come in to have BMET drawn as soon as possible. Orders have been placed. Nothing further needed at this time.

## 2019-07-05 NOTE — Telephone Encounter (Signed)
Pt is returning phone call

## 2019-07-05 NOTE — Telephone Encounter (Signed)
ATC pt, line went to voicemail. LMTCB x1. 

## 2019-07-06 ENCOUNTER — Other Ambulatory Visit (INDEPENDENT_AMBULATORY_CARE_PROVIDER_SITE_OTHER): Payer: Medicare Other

## 2019-07-06 DIAGNOSIS — R0609 Other forms of dyspnea: Secondary | ICD-10-CM

## 2019-07-06 DIAGNOSIS — R0989 Other specified symptoms and signs involving the circulatory and respiratory systems: Secondary | ICD-10-CM

## 2019-07-06 LAB — BASIC METABOLIC PANEL
BUN: 21 mg/dL (ref 6–23)
CO2: 29 mEq/L (ref 19–32)
Calcium: 10.3 mg/dL (ref 8.4–10.5)
Chloride: 100 mEq/L (ref 96–112)
Creatinine, Ser: 1.04 mg/dL (ref 0.40–1.50)
GFR: 86.18 mL/min (ref >60.00)
Glucose, Bld: 220 mg/dL — ABNORMAL HIGH (ref 70–99)
Potassium: 3.8 mEq/L (ref 3.5–5.1)
Sodium: 140 mEq/L (ref 135–145)

## 2019-07-06 NOTE — Telephone Encounter (Signed)
Called and spoke with patient. He states he has no further questions. He states he will head over to our office in a hour for lab draw.   Nothing further needed at this time.

## 2019-07-07 ENCOUNTER — Ambulatory Visit (HOSPITAL_COMMUNITY)
Admission: RE | Admit: 2019-07-07 | Discharge: 2019-07-07 | Disposition: A | Discharge status: Home / Self Care | Payer: Medicare Other | Source: Ambulatory Visit | Attending: Internal Medicine | Admitting: Internal Medicine

## 2019-07-07 ENCOUNTER — Telehealth: Payer: Self-pay | Admitting: Internal Medicine

## 2019-07-07 ENCOUNTER — Encounter (HOSPITAL_COMMUNITY): Payer: Self-pay

## 2019-07-07 ENCOUNTER — Other Ambulatory Visit: Payer: Self-pay

## 2019-07-07 DIAGNOSIS — R0609 Other forms of dyspnea: Secondary | ICD-10-CM | POA: Insufficient documentation

## 2019-07-07 DIAGNOSIS — R0602 Shortness of breath: Secondary | ICD-10-CM | POA: Diagnosis not present

## 2019-07-07 DIAGNOSIS — R0989 Other specified symptoms and signs involving the circulatory and respiratory systems: Secondary | ICD-10-CM | POA: Insufficient documentation

## 2019-07-07 MED ORDER — IOHEXOL 350 MG/ML SOLN
100.0000 mL | Freq: Once | INTRAVENOUS | Status: AC | PRN
  Administered 2019-07-07: 100 mL via INTRAVENOUS

## 2019-07-07 MED ORDER — SODIUM CHLORIDE (PF) 0.9 % IJ SOLN
INTRAMUSCULAR | Status: AC
  Filled 2019-07-07: qty 50

## 2019-07-07 NOTE — Telephone Encounter (Signed)
CT called back patient is able to leave. Nothing further needed at this time.

## 2019-07-07 NOTE — Telephone Encounter (Signed)
He can leave

## 2019-07-07 NOTE — Telephone Encounter (Signed)
Vanda from [facility] called to follow-up w/pt's report.  Please call [phone] (front desk) or [phone] (CT) when patient is ready to be released from the dept.  Pt waiting regarding results.

## 2019-07-07 NOTE — Telephone Encounter (Signed)
[facility] CT calling with patient in office for CT angio to rule out PE.  IMPRESSION: 1. Technically adequate exam showing no evidence for acute large or central pulmonary embolus. 2. Enlarged pulmonary arteries consistent with pulmonary arterial hypertension. 3. Coronary artery disease. 4. Enlarged, mildly heterogeneous RIGHT lobe of the thyroid gland. Consider further evaluation with thyroid ultrasound. If patient is clinically hyperthyroid, consider nuclear medicine thyroid uptake and scan. 5. Stable appearance of sclerotic lesion in the T9 vertebral body, consistent with benign process. 6. The distal aortic arch is mildly aneurysmal, measuring 3.6 centimeters. Recommend annual imaging followup by CTA or MRA. This recommendation follows 2010 ACCF/AHA/AATS/ACR/ASA/SCA/SCAI/SIR/STS/SVM Guidelines for the Diagnosis and Management of Patients with Thoracic Aortic Disease. Circulation.2010; 121: R518-A416. Aortic aneurysm NOS (ICD10-I71.9) 7. Aortic Atherosclerosis (ICD10-I70.0). 8.  Aortic aneurysm NOS (ICD10-I71.9).   Electronically Signed   By: Nolon Nations M.D.   On: 07/07/2019 14:53  [name] is out of office this afternoon, [name] please advise if patient can leave CT

## 2019-07-08 NOTE — Assessment & Plan Note (Signed)
He remains O2 dependent.  Plan- CBC and D-dimer to exclude other contributors.

## 2019-07-08 NOTE — Assessment & Plan Note (Signed)
Continues to benefit with good compliance and control. Plan- continue CPAP 19

## 2019-07-08 NOTE — Assessment & Plan Note (Signed)
Morbid obesity remains a significant health concern. Suggest Healthy Weight program

## 2019-07-20 ENCOUNTER — Ambulatory Visit (INDEPENDENT_AMBULATORY_CARE_PROVIDER_SITE_OTHER): Payer: Medicare Other | Admitting: General Surgery

## 2019-07-20 ENCOUNTER — Other Ambulatory Visit: Payer: Self-pay

## 2019-07-20 ENCOUNTER — Encounter: Payer: Self-pay | Admitting: General Surgery

## 2019-07-20 VITALS — BP 126/74 | HR 58 | Temp 96.9°F | Resp 18 | Ht 68.0 in | Wt 296.0 lb

## 2019-07-20 DIAGNOSIS — Z1211 Encounter for screening for malignant neoplasm of colon: Secondary | ICD-10-CM | POA: Diagnosis not present

## 2019-07-20 MED ORDER — GOLYTELY 236 G PO SOLR: 4000.0000 mL | Freq: Once | ORAL | 0 refills | Status: AC

## 2019-07-20 NOTE — Progress Notes (Signed)
Colton Jackson.; [id number]; 1951/02/18   HPI Patient is a 68 year old black male who was referred to my care by Dr. Hilma Favors for a screening colonoscopy.  Patient states he last had a colonoscopy 10 years ago.  He has not noticed blood on the toilet paper when he wipes himself.  He denies any family history of colon cancer.  He denies any significant weight change, diarrhea, or constipation.  He has 0 out of 10 abdominal pain.  Patient is on home O2 and had some postoperative difficulties with breathing after his previous colonoscopy. Past Medical History:  Diagnosis Date  . Allergic rhinitis   . Degenerative disk disease   . Essential hypertension   . H/O cardiovascular stress test 02/02/11   EF 59% - Normal stress test  . Knee pain, bilateral   . Lower back pain   . Morbid obesity ([place])   . OSA (obstructive sleep apnea)    On CPAP  . Type 2 diabetes mellitus ([place])     Past Surgical History:  Procedure Laterality Date  . APPENDECTOMY    . Cataract surgery Bilateral   . KNEE ARTHROSCOPY Bilateral   . NASAL SEPTOPLASTY W/ TURBINOPLASTY    . RIGHT/LEFT HEART CATH AND CORONARY ANGIOGRAPHY N/A 06/23/2018   Procedure: RIGHT/LEFT HEART CATH AND CORONARY ANGIOGRAPHY;  Surgeon: Larey Dresser, MD;  Location: [facility];  Service: Cardiovascular;  Laterality: N/A;    Family History  Problem Relation Age of Onset  . Lung disease Father   . Stroke Father 45  . Diabetes Father   . Diabetes Maternal Grandfather   . Heart disease Paternal Grandmother   . Stroke Paternal Grandfather 84  . Diabetes Paternal Grandfather   . Diabetes Mother   . Cancer Mother   . Stroke Mother 1  . Diabetes Sister   . Diabetes Brother 14    Current Outpatient Medications on File Prior to Visit  Medication Sig Dispense Refill  . acetaminophen (TYLENOL) 500 MG tablet Take 500 mg by mouth every 6 (six) hours as needed for mild pain or moderate pain (In addition to Hydrocodone/Apap as needed for pain).     Marland Kitchen amLODipine (NORVASC) 10 MG tablet Take 1 tablet by mouth once daily 90 tablet 0  . amoxicillin-clavulanate (AUGMENTIN) 875-125 MG tablet Take 1 tablet by mouth 2 (two) times daily.    Marland Kitchen aspirin 325 MG EC tablet Take 162.5 mg by mouth daily.    . cloNIDine (CATAPRES) 0.2 MG tablet Take 1 tablet (0.2 mg total) by mouth 3 (three) times daily. 90 tablet 3  . cromolyn (NASALCROM) 5.2 MG/ACT nasal spray Place 1 spray into both nostrils 3 (three) times daily.    Marland Kitchen glipiZIDE (GLUCOTROL XL) 10 MG 24 hr tablet Take 10 mg by mouth 2 (two) times daily.    Marland Kitchen glipiZIDE (GLUCOTROL) 10 MG tablet Take 10 mg by mouth daily.    Marland Kitchen HYDROcodone-acetaminophen (NORCO) 10-325 MG tablet Take 1 tablet by mouth 3 (three) times daily as needed for moderate pain.     Marland Kitchen insulin detemir (LEVEMIR) 100 UNIT/ML injection Inject 30-40 Units into the skin daily as needed (for high blood sugar levels).     Marland Kitchen KRILL OIL PO Take 1 tablet by mouth daily.     Marland Kitchen latanoprost (XALATAN) 0.005 % ophthalmic solution Place 1 drop into both eyes at bedtime.      Marland Kitchen LEVEMIR FLEXTOUCH 100 UNIT/ML Pen INJECT 50 TO 56 UNITS SUBCUTANEOUSLY AT BEDTIME    .  losartan (COZAAR) 100 MG tablet Take 1 tablet (100 mg total) by mouth daily. 30 tablet 2  . meclizine (ANTIVERT) 25 MG tablet Take 25 mg by mouth 3 (three) times daily as needed for dizziness or nausea (when taking hydrocodone/apap).     . meloxicam (MOBIC) 7.5 MG tablet Take 7.5 mg by mouth 2 (two) times daily.    . metFORMIN (GLUCOPHAGE-XR) 500 MG 24 hr tablet Take 1,000 mg by mouth 2 (two) times daily.    . metoprolol succinate (TOPROL-XL) 50 MG 24 hr tablet Take 1 tablet (50 mg total) by mouth daily. 30 tablet 3  . mupirocin ointment (BACTROBAN) 2 % APPLY A SMALL AMOUNT OF OINTMENT TOPICALLY TO AFFECTED AREA THREE TIMES DAILY    . senna-docusate (SENOKOT-S) 8.6-50 MG tablet Take 2 tablets by mouth 2 (two) times daily. 120 tablet 1  . trospium (SANCTURA) 20 MG tablet Take 20 mg by mouth 2  (two) times daily.    Marland Kitchen UNABLE TO FIND Take 20 mg by mouth 2 (two) times daily. Med Name: Formitidine.    . furosemide (LASIX) 40 MG tablet Take 1 tablet (40 mg total) by mouth daily. 90 tablet 3   No current facility-administered medications on file prior to visit.     Allergies  Allergen Reactions  . Sulfa Antibiotics Swelling    eyelids    Social History   Substance and Sexual Activity  Alcohol Use No    Social History   Tobacco Use  Smoking Status Former Smoker  . Quit date: 45  . Years since quitting: 49.7  Smokeless Tobacco Never Used  Tobacco Comment   smoked when he was a teenager    Review of Systems  Constitutional: Positive for malaise/fatigue.  HENT: Negative.   Eyes: Negative.   Respiratory: Positive for shortness of breath.   Cardiovascular: Negative.   Gastrointestinal: Positive for heartburn.  Genitourinary: Positive for frequency and urgency.  Musculoskeletal: Positive for back pain and joint pain.  Skin: Negative.   Neurological: Negative.   Endo/Heme/Allergies: Negative.   Psychiatric/Behavioral: Negative.     Objective   Vitals:   07/20/19 1019  BP: 126/74  Pulse: (!) 58  Resp: 18  Temp: (!) 96.9 F (36.1 C)  SpO2: 96%    Physical Exam Vitals signs reviewed.  Constitutional:      Appearance: Normal appearance. He is obese. He is not ill-appearing.  HENT:     Head: Normocephalic and atraumatic.  Cardiovascular:     Rate and Rhythm: Normal rate and regular rhythm.     Heart sounds: Normal heart sounds. No murmur. No friction rub. No gallop.   Pulmonary:     Effort: Pulmonary effort is normal. No respiratory distress.     Breath sounds: Normal breath sounds. No stridor. No wheezing, rhonchi or rales.  Abdominal:     General: There is no distension.     Palpations: Abdomen is soft. There is no mass.     Tenderness: There is no abdominal tenderness. There is no guarding or rebound.     Hernia: No hernia is present.      Comments: Patient on 2 L nasal cannula  Skin:    General: Skin is warm and dry.  Neurological:     Mental Status: He is alert and oriented to person, place, and time.   Primary care notes reviewed  Assessment  Need for screening colonoscopy Plan   Patient is scheduled for a screening colonoscopy under propofol on 08/01/2019.  The  risks and benefits of the procedure including bleeding and perforation were fully explained to the patient, who gave informed consent.  GoLYTELY prep has been prescribed.

## 2019-07-20 NOTE — Patient Instructions (Signed)
Colonoscopy, Adult A colonoscopy is an exam to look at the entire large intestine. During the exam, a lubricated, flexible tube that has a camera on the end of it is inserted into the anus and then passed into the rectum, colon, and other parts of the large intestine. You may have a colonoscopy as a part of normal colorectal screening or if you have certain symptoms, such as:  Lack of red blood cells (anemia).  Diarrhea that does not go away.  Abdominal pain.  Blood in your stool (feces). A colonoscopy can help screen for and diagnose medical problems, including:  Tumors.  Polyps.  Inflammation.  Areas of bleeding. Tell a health care provider about:  Any allergies you have.  All medicines you are taking, including vitamins, herbs, eye drops, creams, and over-the-counter medicines.  Any problems you or family members have had with anesthetic medicines.  Any blood disorders you have.  Any surgeries you have had.  Any medical conditions you have.  Any problems you have had passing stool. What are the risks? Generally, this is a safe procedure. However, problems may occur, including:  Bleeding.  A tear in the intestine.  A reaction to medicines given during the exam.  Infection (rare). What happens before the procedure? Bowel prep If you were prescribed an oral bowel prep to clean out your colon:  Take it as told by your health care provider. Starting the day before your procedure, you will need to drink a large amount of medicated liquid. The liquid will cause you to have multiple loose stools until your stool is almost clear or light green.  If your skin or anus gets irritated from diarrhea, you may use these to relieve the irritation: ? Medicated wipes, such as adult wet wipes with aloe and vitamin E. ? A skin-soothing product like petroleum jelly.  If you vomit while drinking the bowel prep, take a break for up to 60 minutes and then begin the bowel prep again.  If vomiting continues and you cannot take the bowel prep without vomiting, call your health care provider.  To clean out your colon, you may also be given: ? Laxative medicines. ? Instructions about how to use an enema. General instructions  Ask your health care provider about: ? Changing or stopping your regular medicines or supplements. This is especially important if you are taking iron supplements, diabetes medicines, or blood thinners. ? Taking medicines such as aspirin and ibuprofen. These medicines can thin your blood. Do not take these medicines before the procedure if your health care provider tells you not to.  Plan to have someone take you home from the hospital or clinic. What happens during the procedure?   An IV may be inserted into one of your veins.  You will be given medicine to help you relax (sedative).  To reduce your risk of infection: ? Your health care team will wash or sanitize their hands. ? Your anal area will be washed with soap.  You will be asked to lie on your side with your knees bent.  Your health care provider will lubricate a long, thin, flexible tube. The tube will have a camera and a light on the end.  The tube will be inserted into your anus.  The tube will be gently eased through your rectum and colon.  Air will be delivered into your colon to keep it open. You may feel some pressure or cramping.  The camera will be used to take images during  the procedure.  A small tissue sample may be removed to be examined under a microscope (biopsy).  If small polyps are found, your health care provider may remove them and have them checked for cancer cells.  When the exam is done, the tube will be removed. The procedure may vary among health care providers and hospitals. What happens after the procedure?  Your blood pressure, heart rate, breathing rate, and blood oxygen level will be monitored until the medicines you were given have worn off.  Do  not drive for 24 hours after the exam.  You may have a small amount of blood in your stool.  You may pass gas and have mild abdominal cramping or bloating due to the air that was used to inflate your colon during the exam.  It is up to you to get the results of your procedure. Ask your health care provider, or the department performing the procedure, when your results will be ready. Summary  A colonoscopy is an exam to look at the entire large intestine.  During a colonoscopy, a lubricated, flexible tube with a camera on the end of it is inserted into the anus and then passed into the colon and other parts of the large intestine.  Follow instructions from your health care provider about eating and drinking before the procedure.  If you were prescribed an oral bowel prep to clean out your colon, take it as told by your health care provider.  After your procedure, your blood pressure, heart rate, breathing rate, and blood oxygen level will be monitored until the medicines you were given have worn off. This information is not intended to replace advice given to you by your health care provider. Make sure you discuss any questions you have with your health care provider. Document Released: 10/30/2000 Document Revised: 08/25/2017 Document Reviewed: 01/14/2016 Elsevier Patient Education  2020 [employer].

## 2019-07-20 NOTE — H&P (Signed)
Colton Albee Jr.; [id number]; 05/12/1951   HPI Patient is a 68-year-old black male who was referred to my care by Dr. Golding for a screening colonoscopy.  Patient states he last had a colonoscopy 10 years ago.  He has not noticed blood on the toilet paper when he wipes himself.  He denies any family history of colon cancer.  He denies any significant weight change, diarrhea, or constipation.  He has 0 out of 10 abdominal pain.  Patient is on home O2 and had some postoperative difficulties with breathing after his previous colonoscopy. Past Medical History:  Diagnosis Date  . Allergic rhinitis   . Degenerative disk disease   . Essential hypertension   . H/O cardiovascular stress test 02/02/11   EF 59% - Normal stress test  . Knee pain, bilateral   . Lower back pain   . Morbid obesity (HCC)   . OSA (obstructive sleep apnea)    On CPAP  . Type 2 diabetes mellitus (HCC)     Past Surgical History:  Procedure Laterality Date  . APPENDECTOMY    . Cataract surgery Bilateral   . KNEE ARTHROSCOPY Bilateral   . NASAL SEPTOPLASTY W/ TURBINOPLASTY    . RIGHT/LEFT HEART CATH AND CORONARY ANGIOGRAPHY N/A 06/23/2018   Procedure: RIGHT/LEFT HEART CATH AND CORONARY ANGIOGRAPHY;  Surgeon: McLean, Dalton S, MD;  Location: [facility];  Service: Cardiovascular;  Laterality: N/A;    Family History  Problem Relation Age of Onset  . Lung disease Father   . Stroke Father 62  . Diabetes Father   . Diabetes Maternal Grandfather   . Heart disease Paternal Grandmother   . Stroke Paternal Grandfather 65  . Diabetes Paternal Grandfather   . Diabetes Mother   . Cancer Mother   . Stroke Mother 70  . Diabetes Sister   . Diabetes Brother 36    Current Outpatient Medications on File Prior to Visit  Medication Sig Dispense Refill  . acetaminophen (TYLENOL) 500 MG tablet Take 500 mg by mouth every 6 (six) hours as needed for mild pain or moderate pain (In addition to Hydrocodone/Apap as needed for pain).     . amLODipine (NORVASC) 10 MG tablet Take 1 tablet by mouth once daily 90 tablet 0  . amoxicillin-clavulanate (AUGMENTIN) 875-125 MG tablet Take 1 tablet by mouth 2 (two) times daily.    . aspirin 325 MG EC tablet Take 162.5 mg by mouth daily.    . cloNIDine (CATAPRES) 0.2 MG tablet Take 1 tablet (0.2 mg total) by mouth 3 (three) times daily. 90 tablet 3  . cromolyn (NASALCROM) 5.2 MG/ACT nasal spray Place 1 spray into both nostrils 3 (three) times daily.    . glipiZIDE (GLUCOTROL XL) 10 MG 24 hr tablet Take 10 mg by mouth 2 (two) times daily.    . glipiZIDE (GLUCOTROL) 10 MG tablet Take 10 mg by mouth daily.    . HYDROcodone-acetaminophen (NORCO) 10-325 MG tablet Take 1 tablet by mouth 3 (three) times daily as needed for moderate pain.     . insulin detemir (LEVEMIR) 100 UNIT/ML injection Inject 30-40 Units into the skin daily as needed (for high blood sugar levels).     . KRILL OIL PO Take 1 tablet by mouth daily.     . latanoprost (XALATAN) 0.005 % ophthalmic solution Place 1 drop into both eyes at bedtime.      . LEVEMIR FLEXTOUCH 100 UNIT/ML Pen INJECT 50 TO 56 UNITS SUBCUTANEOUSLY AT BEDTIME    .   losartan (COZAAR) 100 MG tablet Take 1 tablet (100 mg total) by mouth daily. 30 tablet 2  . meclizine (ANTIVERT) 25 MG tablet Take 25 mg by mouth 3 (three) times daily as needed for dizziness or nausea (when taking hydrocodone/apap).     . meloxicam (MOBIC) 7.5 MG tablet Take 7.5 mg by mouth 2 (two) times daily.    . metFORMIN (GLUCOPHAGE-XR) 500 MG 24 hr tablet Take 1,000 mg by mouth 2 (two) times daily.    . metoprolol succinate (TOPROL-XL) 50 MG 24 hr tablet Take 1 tablet (50 mg total) by mouth daily. 30 tablet 3  . mupirocin ointment (BACTROBAN) 2 % APPLY A SMALL AMOUNT OF OINTMENT TOPICALLY TO AFFECTED AREA THREE TIMES DAILY    . senna-docusate (SENOKOT-S) 8.6-50 MG tablet Take 2 tablets by mouth 2 (two) times daily. 120 tablet 1  . trospium (SANCTURA) 20 MG tablet Take 20 mg by mouth 2  (two) times daily.    . UNABLE TO FIND Take 20 mg by mouth 2 (two) times daily. Med Name: Formitidine.    . furosemide (LASIX) 40 MG tablet Take 1 tablet (40 mg total) by mouth daily. 90 tablet 3   No current facility-administered medications on file prior to visit.     Allergies  Allergen Reactions  . Sulfa Antibiotics Swelling    eyelids    Social History   Substance and Sexual Activity  Alcohol Use No    Social History   Tobacco Use  Smoking Status Former Smoker  . Quit date: 1971  . Years since quitting: 49.7  Smokeless Tobacco Never Used  Tobacco Comment   smoked when he was a teenager    Review of Systems  Constitutional: Positive for malaise/fatigue.  HENT: Negative.   Eyes: Negative.   Respiratory: Positive for shortness of breath.   Cardiovascular: Negative.   Gastrointestinal: Positive for heartburn.  Genitourinary: Positive for frequency and urgency.  Musculoskeletal: Positive for back pain and joint pain.  Skin: Negative.   Neurological: Negative.   Endo/Heme/Allergies: Negative.   Psychiatric/Behavioral: Negative.     Objective   Vitals:   07/20/19 1019  BP: 126/74  Pulse: (!) 58  Resp: 18  Temp: (!) 96.9 F (36.1 C)  SpO2: 96%    Physical Exam Vitals signs reviewed.  Constitutional:      Appearance: Normal appearance. He is obese. He is not ill-appearing.  HENT:     Head: Normocephalic and atraumatic.  Cardiovascular:     Rate and Rhythm: Normal rate and regular rhythm.     Heart sounds: Normal heart sounds. No murmur. No friction rub. No gallop.   Pulmonary:     Effort: Pulmonary effort is normal. No respiratory distress.     Breath sounds: Normal breath sounds. No stridor. No wheezing, rhonchi or rales.  Abdominal:     General: There is no distension.     Palpations: Abdomen is soft. There is no mass.     Tenderness: There is no abdominal tenderness. There is no guarding or rebound.     Hernia: No hernia is present.      Comments: Patient on 2 L nasal cannula  Skin:    General: Skin is warm and dry.  Neurological:     Mental Status: He is alert and oriented to person, place, and time.   Primary care notes reviewed  Assessment  Need for screening colonoscopy Plan   Patient is scheduled for a screening colonoscopy under propofol on 08/01/2019.  The   risks and benefits of the procedure including bleeding and perforation were fully explained to the patient, who gave informed consent.  GoLYTELY prep has been prescribed. 

## 2019-07-25 ENCOUNTER — Other Ambulatory Visit (HOSPITAL_COMMUNITY): Payer: Self-pay | Admitting: Family Medicine

## 2019-07-25 DIAGNOSIS — Z0001 Encounter for general adult medical examination with abnormal findings: Secondary | ICD-10-CM | POA: Diagnosis not present

## 2019-07-25 DIAGNOSIS — I712 Thoracic aortic aneurysm, without rupture: Secondary | ICD-10-CM | POA: Diagnosis not present

## 2019-07-25 DIAGNOSIS — E079 Disorder of thyroid, unspecified: Secondary | ICD-10-CM | POA: Diagnosis not present

## 2019-07-25 DIAGNOSIS — Z681 Body mass index (BMI) 19 or less, adult: Secondary | ICD-10-CM | POA: Diagnosis not present

## 2019-07-25 DIAGNOSIS — I7122 Aneurysm of the aortic arch, without rupture: Secondary | ICD-10-CM

## 2019-07-25 DIAGNOSIS — Z23 Encounter for immunization: Secondary | ICD-10-CM | POA: Diagnosis not present

## 2019-07-25 DIAGNOSIS — G894 Chronic pain syndrome: Secondary | ICD-10-CM | POA: Diagnosis not present

## 2019-07-25 DIAGNOSIS — Z1389 Encounter for screening for other disorder: Secondary | ICD-10-CM | POA: Diagnosis not present

## 2019-07-27 ENCOUNTER — Other Ambulatory Visit: Payer: Self-pay

## 2019-07-27 ENCOUNTER — Ambulatory Visit (HOSPITAL_COMMUNITY)
Admission: RE | Admit: 2019-07-27 | Discharge: 2019-07-27 | Disposition: A | Discharge status: Home / Self Care | Payer: Medicare Other | Source: Ambulatory Visit | Attending: Family Medicine | Admitting: Family Medicine

## 2019-07-27 ENCOUNTER — Encounter (HOSPITAL_COMMUNITY): Payer: Self-pay

## 2019-07-27 DIAGNOSIS — E041 Nontoxic single thyroid nodule: Secondary | ICD-10-CM | POA: Diagnosis not present

## 2019-07-27 DIAGNOSIS — E079 Disorder of thyroid, unspecified: Secondary | ICD-10-CM | POA: Insufficient documentation

## 2019-07-27 DIAGNOSIS — I712 Thoracic aortic aneurysm, without rupture: Secondary | ICD-10-CM | POA: Insufficient documentation

## 2019-07-27 DIAGNOSIS — I7122 Aneurysm of the aortic arch, without rupture: Secondary | ICD-10-CM

## 2019-07-28 ENCOUNTER — Other Ambulatory Visit (HOSPITAL_COMMUNITY)
Admission: RE | Admit: 2019-07-28 | Discharge: 2019-07-28 | Disposition: A | Discharge status: Home / Self Care | Payer: Medicare Other | Source: Ambulatory Visit | Attending: General Surgery | Admitting: General Surgery

## 2019-07-28 ENCOUNTER — Encounter (HOSPITAL_COMMUNITY)
Admission: RE | Admit: 2019-07-28 | Discharge: 2019-07-28 | Disposition: A | Discharge status: Home / Self Care | Payer: Medicare Other | Source: Ambulatory Visit | Attending: General Surgery | Admitting: General Surgery

## 2019-07-28 DIAGNOSIS — Z01812 Encounter for preprocedural laboratory examination: Secondary | ICD-10-CM | POA: Insufficient documentation

## 2019-07-28 DIAGNOSIS — Z20828 Contact with and (suspected) exposure to other viral communicable diseases: Secondary | ICD-10-CM | POA: Diagnosis not present

## 2019-07-28 HISTORY — DX: Aneurysm of carotid artery: I72.0

## 2019-07-28 HISTORY — DX: Gastro-esophageal reflux disease without esophagitis: K21.9

## 2019-07-28 HISTORY — DX: Unspecified hearing loss, right ear: H91.91

## 2019-07-28 LAB — SARS CORONAVIRUS 2 (TAT 6-24 HRS): SARS Coronavirus 2: NEGATIVE

## 2019-08-01 ENCOUNTER — Encounter (HOSPITAL_COMMUNITY): Payer: Self-pay

## 2019-08-01 ENCOUNTER — Ambulatory Visit (HOSPITAL_COMMUNITY)
Admission: RE | Admit: 2019-08-01 | Discharge: 2019-08-01 | Disposition: A | Discharge status: Home / Self Care | Payer: Medicare Other | Attending: General Surgery | Admitting: General Surgery

## 2019-08-01 ENCOUNTER — Other Ambulatory Visit: Payer: Self-pay

## 2019-08-01 ENCOUNTER — Encounter (HOSPITAL_COMMUNITY)
Admission: RE | Disposition: A | Discharge status: Home / Self Care | Payer: Self-pay | Source: Home / Self Care | Attending: General Surgery

## 2019-08-01 ENCOUNTER — Ambulatory Visit (HOSPITAL_COMMUNITY): Payer: Medicare Other | Admitting: Anesthesiology

## 2019-08-01 DIAGNOSIS — Z791 Long term (current) use of non-steroidal anti-inflammatories (NSAID): Secondary | ICD-10-CM | POA: Insufficient documentation

## 2019-08-01 DIAGNOSIS — J45909 Unspecified asthma, uncomplicated: Secondary | ICD-10-CM | POA: Diagnosis not present

## 2019-08-01 DIAGNOSIS — G4733 Obstructive sleep apnea (adult) (pediatric): Secondary | ICD-10-CM | POA: Diagnosis not present

## 2019-08-01 DIAGNOSIS — Z8249 Family history of ischemic heart disease and other diseases of the circulatory system: Secondary | ICD-10-CM | POA: Diagnosis not present

## 2019-08-01 DIAGNOSIS — Z87891 Personal history of nicotine dependence: Secondary | ICD-10-CM | POA: Insufficient documentation

## 2019-08-01 DIAGNOSIS — Z882 Allergy status to sulfonamides status: Secondary | ICD-10-CM | POA: Insufficient documentation

## 2019-08-01 DIAGNOSIS — Z7982 Long term (current) use of aspirin: Secondary | ICD-10-CM | POA: Insufficient documentation

## 2019-08-01 DIAGNOSIS — Z794 Long term (current) use of insulin: Secondary | ICD-10-CM | POA: Insufficient documentation

## 2019-08-01 DIAGNOSIS — E119 Type 2 diabetes mellitus without complications: Secondary | ICD-10-CM | POA: Insufficient documentation

## 2019-08-01 DIAGNOSIS — I10 Essential (primary) hypertension: Secondary | ICD-10-CM | POA: Insufficient documentation

## 2019-08-01 DIAGNOSIS — Z79899 Other long term (current) drug therapy: Secondary | ICD-10-CM | POA: Diagnosis not present

## 2019-08-01 DIAGNOSIS — Z1211 Encounter for screening for malignant neoplasm of colon: Secondary | ICD-10-CM | POA: Diagnosis not present

## 2019-08-01 HISTORY — PX: COLONOSCOPY WITH PROPOFOL: SHX5780

## 2019-08-01 LAB — GLUCOSE, CAPILLARY: Glucose-Capillary: 162 mg/dL — ABNORMAL HIGH (ref 70–99)

## 2019-08-01 SURGERY — COLONOSCOPY WITH PROPOFOL
Anesthesia: Monitor Anesthesia Care

## 2019-08-01 MED ORDER — LACTATED RINGERS IV SOLN
INTRAVENOUS | Status: DC
  Administered 2019-08-01: 08:00:00 via INTRAVENOUS

## 2019-08-01 MED ORDER — LIDOCAINE HCL (PF) 1 % IJ SOLN
INTRAMUSCULAR | Status: AC
  Filled 2019-08-01: qty 5

## 2019-08-01 MED ORDER — ETOMIDATE 2 MG/ML IV SOLN
INTRAVENOUS | Status: DC | PRN
  Administered 2019-08-01: 4 mg via INTRAVENOUS
  Administered 2019-08-01 (×2): 2 mg via INTRAVENOUS

## 2019-08-01 MED ORDER — PROPOFOL 10 MG/ML IV BOLUS
INTRAVENOUS | Status: DC | PRN
  Administered 2019-08-01 (×3): 10 mg via INTRAVENOUS

## 2019-08-01 MED ORDER — STERILE WATER FOR IRRIGATION IR SOLN
Status: DC | PRN
  Administered 2019-08-01: 1.5 mL

## 2019-08-01 MED ORDER — KETAMINE HCL 50 MG/5ML IJ SOSY
PREFILLED_SYRINGE | INTRAMUSCULAR | Status: AC
  Filled 2019-08-01: qty 5

## 2019-08-01 MED ORDER — LIDOCAINE HCL (CARDIAC) PF 100 MG/5ML IV SOSY
PREFILLED_SYRINGE | INTRAVENOUS | Status: DC | PRN
  Administered 2019-08-01: 20 mg via INTRAVENOUS

## 2019-08-01 MED ORDER — PROPOFOL 10 MG/ML IV BOLUS
INTRAVENOUS | Status: AC
  Filled 2019-08-01: qty 40

## 2019-08-01 MED ORDER — ONDANSETRON HCL 4 MG/2ML IJ SOLN: 4.0000 mg | Freq: Once | INTRAMUSCULAR | Status: DC | PRN

## 2019-08-01 MED ORDER — ETOMIDATE 2 MG/ML IV SOLN
INTRAVENOUS | Status: AC
  Filled 2019-08-01: qty 10

## 2019-08-01 MED ORDER — FENTANYL CITRATE (PF) 100 MCG/2ML IJ SOLN
INTRAMUSCULAR | Status: AC
  Filled 2019-08-01: qty 2

## 2019-08-01 NOTE — Transfer of Care (Signed)
Immediate Anesthesia Transfer of Care Note  Patient: Colton Jackson.  Procedure(s) Performed: COLONOSCOPY WITH PROPOFOL (N/A )  Patient Location: PACU  Anesthesia Type:MAC  Level of Consciousness: awake, alert  and oriented  Airway & Oxygen Therapy: Patient Spontanous Breathing and Patient connected to nasal cannula oxygen  Post-op Assessment: Report given to RN, Post -op Vital signs reviewed and stable and Patient moving all extremities X 4  Post vital signs: Reviewed and stable  Last Vitals:  Vitals Value Taken Time  BP 130/62 08/01/19 0900  Temp    Pulse 76 08/01/19 0903  Resp 17 08/01/19 0903  SpO2 98 % 08/01/19 0903  Vitals shown include unvalidated device data.  Last Pain:  Vitals:   08/01/19 0832  TempSrc:   PainSc: 0-No pain         Complications: No apparent anesthesia complications

## 2019-08-01 NOTE — Op Note (Signed)
[facility] Patient Name: Colton Jackson Procedure Date: 08/01/2019 8:00 AM MRN: [id number] Date of Birth: 19-Nov-1950 Attending MD: Aviva Signs , MD CSN: [id number] Age: 68 Admit Type: Outpatient Procedure:                Colonoscopy Indications:              Screening for colorectal malignant neoplasm Providers:                Aviva Signs, MD, Charlsie Quest. Theda Sers RN, RN, Aram Candela Referring MD:              Medicines:                Propofol per Anesthesia Complications:            No immediate complications. Estimated blood loss:                            None. Estimated Blood Loss:     Estimated blood loss: none. Procedure:                Pre-Anesthesia Assessment:                           - Prior to the procedure, a History and Physical                            was performed, and patient medications and                            allergies were reviewed. The patient is competent.                            The risks and benefits of the procedure and the                            sedation options and risks were discussed with the                            patient. All questions were answered and informed                            consent was obtained. Patient identification and                            proposed procedure were verified by the physician,                            the nurse, the anesthesiologist, the anesthetist                            and the technician in the endoscopy suite. Mental                            Status Examination:  alert and oriented. Airway                            Examination: normal oropharyngeal airway and neck                            mobility. Respiratory Examination: clear to                            auscultation. CV Examination: RRR, no murmurs, no                            S3 or S4. Prophylactic Antibiotics: The patient                            does not require prophylactic antibiotics.  Prior                            Anticoagulants: The patient has taken no previous                            anticoagulant or antiplatelet agents except for                            aspirin. ASA Grade Assessment: III - A patient with                            severe systemic disease. After reviewing the risks                            and benefits, the patient was deemed in                            satisfactory condition to undergo the procedure.                            The anesthesia plan was to use monitored anesthesia                            care (MAC). Immediately prior to administration of                            medications, the patient was re-assessed for                            adequacy to receive sedatives. The heart rate,                            respiratory rate, oxygen saturations, blood                            pressure, adequacy of pulmonary ventilation, and  response to care were monitored throughout the                            procedure. The physical status of the patient was                            re-assessed after the procedure.                           - Using IV propofol under the supervision of a CRNA                            was determined to be medically necessary for this                            procedure based on review of the patient's medical                            history, medications, and prior anesthesia history.                           After obtaining informed consent, the colonoscope                            was passed under direct vision. Throughout the                            procedure, the patient's blood pressure, pulse, and                            oxygen saturations were monitored continuously. The                            [id number]-[id number] ([id number]) scope was introduced through                            the anus and advanced to the the cecum, identified                            by  appendiceal orifice and ileocecal valve. No                            anatomical landmarks were photographed. The entire                            colon was well visualized. The colonoscopy was                            performed without difficulty. The patient tolerated                            the procedure well. The quality of the bowel  preparation was adequate. The total duration of the                            procedure was 11 minutes. Scope In: 8:39:23 AM Scope Out: 8:50:11 AM Scope Withdrawal Time: 0 hours 3 minutes 41 seconds  Total Procedure Duration: 0 hours 10 minutes 48 seconds  Findings:      The perianal and digital rectal examinations were normal.      The entire examined colon appeared normal on direct and retroflexion       views. Impression:               - The entire examined colon is normal on direct and                            retroflexion views.                           - No specimens collected. Moderate Sedation:      Moderate (conscious) sedation was personally administered by an       anesthesia professional. The following parameters were monitored: oxygen       saturation, heart rate, blood pressure, and response to care. Recommendation:           - Written discharge instructions were provided to                            the patient.                           - The signs and symptoms of potential delayed                            complications were discussed with the patient.                           - Patient has a contact number available for                            emergencies.                           - Return to normal activities tomorrow.                           - Resume previous diet.                           - Continue present medications.                           - Repeat colonoscopy in 10 years for screening                            purposes. Procedure Code(s):        --- Professional ---  45378, Colonoscopy, flexible; diagnostic, including                            collection of specimen(s) by brushing or washing,                            when performed (separate procedure) Diagnosis Code(s):        --- Professional ---                           Z12.11, Encounter for screening for malignant                            neoplasm of colon CPT copyright 2019 [employer]. All rights reserved. The codes documented in this report are preliminary and upon coder review may  be revised to meet current compliance requirements. Aviva Signs, MD Aviva Signs, MD 08/01/2019 9:09:27 AM This report has been signed electronically. Number of Addenda: 0

## 2019-08-01 NOTE — Discharge Instructions (Addendum)
Colonoscopy, Adult, Care After This sheet gives you information about how to care for yourself after your procedure. Your health care provider may also give you more specific instructions. If you have problems or questions, contact your health care provider. What can I expect after the procedure? After the procedure, it is common to have:  A small amount of blood in your stool for 24 hours after the procedure.  Some gas.  Mild abdominal cramping or bloating. Follow these instructions at home: General instructions  For the first 24 hours after the procedure: ? Do not drive or use machinery. ? Do not sign important documents. ? Do not drink alcohol. ? Do your regular daily activities at a slower pace than normal. ? Eat soft, easy-to-digest foods.  Take over-the-counter or prescription medicines only as told by your health care provider. Relieving cramping and bloating   Try walking around when you have cramps or feel bloated.  Apply heat to your abdomen as told by your health care provider. Use a heat source that your health care provider recommends, such as a moist heat pack or a heating pad. ? Place a towel between your skin and the heat source. ? Leave the heat on for 20-30 minutes. ? Remove the heat if your skin turns bright red. This is especially important if you are unable to feel pain, heat, or cold. You may have a greater risk of getting burned. Eating and drinking   Drink enough fluid to keep your urine pale yellow.  Resume your normal diet as instructed by your health care provider. Avoid heavy or fried foods that are hard to digest.  Avoid drinking alcohol for as long as instructed by your health care provider. Contact a health care provider if:  You have blood in your stool 2-3 days after the procedure. Get help right away if:  You have more than a small spotting of blood in your stool.  You pass large blood clots in your stool.  Your abdomen is  swollen.  You have nausea or vomiting.  You have a fever.  You have increasing abdominal pain that is not relieved with medicine. Summary  After the procedure, it is common to have a small amount of blood in your stool. You may also have mild abdominal cramping and bloating.  For the first 24 hours after the procedure, do not drive or use machinery, sign important documents, or drink alcohol.  Contact your health care provider if you have a lot of blood in your stool, nausea or vomiting, a fever, or increased abdominal pain. This information is not intended to replace advice given to you by your health care provider. Make sure you discuss any questions you have with your health care provider. Document Released: 06/16/2004 Document Revised: 08/25/2017 Document Reviewed: 01/14/2016 Elsevier Patient Education  2020 [employer].

## 2019-08-01 NOTE — Anesthesia Preprocedure Evaluation (Addendum)
Anesthesia Evaluation  Patient identified by MRN, date of birth, ID band Patient awake    Reviewed: Allergy & Precautions, NPO status , Patient's Chart, lab work & pertinent test results, reviewed documented beta blocker date and time   Airway Mallampati: III  TM Distance: >3 FB Neck ROM: Full    Dental  (+) Missing, Dental Advisory Given   Pulmonary asthma , sleep apnea, Continuous Positive Airway Pressure Ventilation and Oxygen sleep apnea , former smoker,    breath sounds clear to auscultation       Cardiovascular Exercise Tolerance: Poor hypertension (last dose of metoprolol - 08/01/19), Pt. on medications and Pt. on home beta blockers + DOE   Rhythm:Regular Rate:Normal  Study Conclusions  - Left ventricle: The cavity size was normal. Wall thickness was   increased in a pattern of moderate LVH. Systolic function was   vigorous. The estimated ejection fraction was in the range of 65%   to 70%. Wall motion was normal; there were no regional wall   motion abnormalities. Left ventricular diastolic function   parameters were normal for the patient&'s age. - Aortic valve: Mildly to moderately calcified annulus. Trileaflet.   Mean gradient (S): 5 mm Hg. Valve area (VTI): 3.67 cm^2. - Mitral valve: Mildly calcified annulus. There was trivial   regurgitation. - Right atrium: Central venous pressure (est): 8 mm Hg. - Atrial septum: The septum bowed from right to left, consistent   with increased right atrial pressure. - Tricuspid valve: There was mild regurgitation. - Pulmonary arteries: Systolic pressure was severely increased. PA   peak pressure: 72 mm Hg (S). - Pericardium, extracardiac: There was no pericardial effusion.    Neuro/Psych    GI/Hepatic GERD  Medicated,  Endo/Other  diabetes, Type 2, Insulin DependentMorbid obesity  Renal/GU      Musculoskeletal  (+) Arthritis , Osteoarthritis,    Abdominal   Peds   Hematology   Anesthesia Other Findings 68 y.o. with history of OHS/OSA, chronic bronchitis, and suspected pulmonary hypertension was referred by Dr. Domenic Polite for evaluation of pulmonary hypertension. Patient is on 2L home oxygen and CPAP. He has had gradually worsening exertional dyspnea over the last year.  He was able to mow his grass last fall but cannot now.  He is short of breath after walking about 50-60 feet with his oxygen on.  He is short of breath with any incline.   Echo in 3/19 showed normal EF with PASP 72 mmHg.  I had him do left/right heart cath in 8/19.  This showed no CAD and moderate mixed pulmonary venous/pulmonary arterial hypertension.  Very mildly elevated filling pressures.    He is followed by pulmonary.  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 V/Q scan in 9/19 did not show evidence for chronic PE.   He returns today for followup of pulmonary hypertension, diastolic CHF.  His breathing is the same, still gets short of breath walking short distances.  He continues to have episodes of chest pain.  It has been on and off, no particular trigger.  He went to the ER with CP on 8/30, workup was negative and he was sent home.  The only thing that helps is hydrocodone.  BP high in the office today, but SBP 110s-120s at home.   Labs (11/18): BNP 21 Labs (7/19): LDL 79 Labs (8/19): K 3.9, creatinine 0.73 Labs (9/19): ACE normal  PMH: 1. OHS/OSA: Uses 2L  home oxygen and CPAP.  2. Obesity 3. HTN 4. Type II diabetes 5. Chronic bronchitis: ?from welding.  Never smoked.    - CT chest (3/19): coronary calcification, mild emphysema, calcified mediastinal and hilar nodes consistent with prior granulomatous infection.  - PFTs (12/17): FVC 56%, FEV1 57%, ratio 101%, SVC 109%, DLCO 66%.  Moderate obstruction/severe restriction.  6. CAD: Coronary calcification on CT chest 3/19.   - Cardiolite in 2012 showed  no ischemia.  - LHC (8/19): No significant CAD.  7. Pulmonary hypertension: Echo (3/19) with EF 65-70%, moderate LVH, PASP 72 mmHg.  - RHC (8/19): mean RA 8, PA 67/24 mean 43, mean PCWP 17, CI 2.99, PVR 3.5 WU.  - V/Q scan (9/19): No evidence for chronic PE.   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 Lives in [place].   Reproductive/Obstetrics                          Anesthesia Physical Anesthesia Plan  ASA: IV  Anesthesia Plan: MAC   Post-op Pain Management:    Induction: Intravenous  PONV Risk Score and Plan:   Airway Management Planned: Natural Airway, Nasal Cannula and Simple Face Mask  Additional Equipment:   Intra-op Plan:   Post-operative Plan:   Informed Consent: I have reviewed the patients History and Physical, chart, labs and discussed the procedure including the risks, benefits and alternatives for the proposed anesthesia with the patient or authorized representative who has indicated his/her understanding and acceptance.     Dental advisory given  Plan Discussed with: CRNA  Anesthesia Plan Comments: (Possible GA with ETT was explained)      Anesthesia Quick Evaluation

## 2019-08-01 NOTE — Anesthesia Postprocedure Evaluation (Addendum)
Anesthesia Post Note  Patient: Colton Jackson.  Procedure(s) Performed: COLONOSCOPY WITH PROPOFOL (N/A )  Patient location during evaluation: PACU Anesthesia Type: MAC Level of consciousness: awake and alert and patient cooperative Pain management: satisfactory to patient Vital Signs Assessment: post-procedure vital signs reviewed and stable Respiratory status: spontaneous breathing Cardiovascular status: stable Postop Assessment: no apparent nausea or vomiting Anesthetic complications: no     Last Vitals:  Vitals:   08/01/19 0920 08/01/19 0938  BP: (!) 141/79 (!) 166/84  Pulse: 73 72  Resp:  20  Temp:  36.7 C  SpO2: 100% 100%    Last Pain:  Vitals:   08/01/19 0938  TempSrc: Oral  PainSc: 0-No pain                 Ashmi Blas

## 2019-08-01 NOTE — Interval H&P Note (Signed)
History and Physical Interval Note:  08/01/2019 8:25 AM  Colton Jackson.  has presented today for surgery, with the diagnosis of screening.  The various methods of treatment have been discussed with the patient and family. After consideration of risks, benefits and other options for treatment, the patient has consented to  Procedure(s): COLONOSCOPY WITH PROPOFOL (N/A) as a surgical intervention.  The patient's history has been reviewed, patient examined, no change in status, stable for surgery.  I have reviewed the patient's chart and labs.  Questions were answered to the patient's satisfaction.     Aviva Signs

## 2019-08-03 ENCOUNTER — Encounter (HOSPITAL_COMMUNITY): Payer: Self-pay | Admitting: General Surgery

## 2019-08-24 ENCOUNTER — Other Ambulatory Visit: Payer: Self-pay

## 2019-08-25 DIAGNOSIS — Z6841 Body Mass Index (BMI) 40.0 and over, adult: Secondary | ICD-10-CM | POA: Diagnosis not present

## 2019-08-25 DIAGNOSIS — G894 Chronic pain syndrome: Secondary | ICD-10-CM | POA: Diagnosis not present

## 2019-08-28 ENCOUNTER — Other Ambulatory Visit: Payer: Self-pay

## 2019-08-28 ENCOUNTER — Ambulatory Visit (INDEPENDENT_AMBULATORY_CARE_PROVIDER_SITE_OTHER): Payer: Medicare Other | Admitting: Internal Medicine

## 2019-08-28 ENCOUNTER — Encounter: Payer: Self-pay | Admitting: Internal Medicine

## 2019-08-28 VITALS — BP 150/70 | HR 96 | Ht 68.0 in | Wt 296.2 lb

## 2019-08-28 DIAGNOSIS — E041 Nontoxic single thyroid nodule: Secondary | ICD-10-CM | POA: Diagnosis not present

## 2019-08-28 LAB — TSH: TSH: 1.56 u[IU]/mL (ref 0.35–4.50)

## 2019-08-28 LAB — T4, FREE: Free T4: 0.86 ng/dL (ref 0.60–1.60)

## 2019-08-28 NOTE — Patient Instructions (Signed)
-  Stop by the lab today  - Will set you up for the thyroid biopsy at [facility], If you don't hear about it in 3 weeks, please contact our office

## 2019-08-28 NOTE — Progress Notes (Signed)
Name: Colton Jackson.  MRN/ DOB: [id number], 18-Aug-1951    Age/ Sex: 68 y.o., male    PCP: Sharilyn Sites, MD   Reason for Endocrinology Evaluation: Right thyroid Nodule      Date of Initial Endocrinology Evaluation: 08/29/2019     HPI: [name]. is a 68 y.o. male with a past medical history of HTN, DM, PH and OSA. The patient presented for initial endocrinology clinic visit on 08/29/2019 for consultative assistance with his Right thyroid nodule .   Pt was noted to have a right thyroid nodule on CT angio of the chest while evaluating SOB.   Denies local neck symptoms  Has noted weight gain in the past few months  Has chronic constipation but no diarrhea  Denies depression and anxiety   No prior exposure to radiation   No FH of thyroid disease  No  Biotin use.     HISTORY:  Past Medical History:  Past Medical History:  Diagnosis Date  . Allergic rhinitis   . Aneurysm artery, neck (HCC)    [name] pulmonologist  . Degenerative disk disease   . Essential hypertension   . GERD (gastroesophageal reflux disease)   . H/O cardiovascular stress test 02/02/11   EF 59% - Normal stress test  . Hearing deficit, right   . Knee pain, bilateral   . Lower back pain   . Morbid obesity ([place])   . OSA (obstructive sleep apnea)    On CPAP  . Type 2 diabetes mellitus ([place])    Past Surgical History:  Past Surgical History:  Procedure Laterality Date  . APPENDECTOMY    . CARDIAC CATHETERIZATION    . Cataract surgery Bilateral   . COLONOSCOPY WITH PROPOFOL N/A 08/01/2019   Procedure: COLONOSCOPY WITH PROPOFOL;  Surgeon: Aviva Signs, MD;  Location: [facility];  Service: General;  Laterality: N/A;  . KNEE ARTHROSCOPY Bilateral   . NASAL SEPTOPLASTY W/ TURBINOPLASTY    . RIGHT/LEFT HEART CATH AND CORONARY ANGIOGRAPHY N/A 06/23/2018   Procedure: RIGHT/LEFT HEART CATH AND CORONARY ANGIOGRAPHY;  Surgeon: Larey Dresser, MD;  Location: [facility];  Service:  Cardiovascular;  Laterality: N/A;      Social History:  reports that he quit smoking about 49 years ago. He has never used smokeless tobacco. He reports that he does not drink alcohol or use drugs.  Family History: family history includes Cancer in his mother; Diabetes in his father, maternal grandfather, mother, paternal grandfather, and sister; Diabetes (age of onset: 45) in his brother; Heart disease in his paternal grandmother; Lung disease in his father; Stroke (age of onset: 61) in his father; Stroke (age of onset: 68) in his paternal grandfather; Stroke (age of onset: 71) in his mother.   HOME MEDICATIONS: Allergies as of 08/28/2019      Reactions   Sulfa Antibiotics Swelling   eyelids      Medication List       Accurate as of August 28, 2019 11:59 PM. If you have any questions, ask your nurse or doctor.        acetaminophen 500 MG tablet Commonly known as: TYLENOL Take 500 mg by mouth every 6 (six) hours as needed for mild pain or moderate pain (In addition to Hydrocodone/Apap as needed for pain).   amLODipine 10 MG tablet Commonly known as: NORVASC Take 1 tablet by mouth once daily   aspirin 325 MG EC tablet Take 162.5 mg by mouth daily.  cloNIDine 0.2 MG tablet Commonly known as: Catapres Take 1 tablet (0.2 mg total) by mouth 3 (three) times daily.   cromolyn 5.2 MG/ACT nasal spray Commonly known as: NASALCROM Place 1 spray into both nostrils 3 (three) times daily as needed for allergies.   famotidine 20 MG tablet Commonly known as: PEPCID Take 20 mg by mouth 2 (two) times daily.   furosemide 40 MG tablet Commonly known as: LASIX Take 1 tablet (40 mg total) by mouth daily.   glipiZIDE 10 MG tablet Commonly known as: GLUCOTROL Take 10 mg by mouth 2 (two) times daily.   HYDROcodone-acetaminophen 10-325 MG tablet Commonly known as: NORCO Take 1 tablet by mouth 3 (three) times daily as needed for moderate pain.   insulin detemir 100 UNIT/ML injection  Commonly known as: LEVEMIR Inject 20-40 Units into the skin at bedtime as needed (for high blood sugar levels over 120).   Krill Oil 500 MG Caps Take 500 mg by mouth daily.   latanoprost 0.005 % ophthalmic solution Commonly known as: XALATAN Place 1 drop into both eyes at bedtime.   losartan 100 MG tablet Commonly known as: COZAAR Take 1 tablet (100 mg total) by mouth daily.   meclizine 25 MG tablet Commonly known as: ANTIVERT Take 25 mg by mouth 3 (three) times daily as needed for dizziness or nausea (when taking hydrocodone/apap).   meloxicam 7.5 MG tablet Commonly known as: MOBIC Take 7.5 mg by mouth 2 (two) times daily.   metFORMIN 500 MG 24 hr tablet Commonly known as: GLUCOPHAGE-XR Take 1,000 mg by mouth 2 (two) times daily.   metoprolol succinate 50 MG 24 hr tablet Commonly known as: TOPROL-XL Take 1 tablet (50 mg total) by mouth daily.   mupirocin ointment 2 % Commonly known as: BACTROBAN Apply 1 application topically 3 (three) times daily.   psyllium 58.6 % powder Commonly known as: METAMUCIL Take 1 packet by mouth 2 (two) times daily.   trospium 20 MG tablet Commonly known as: SANCTURA Take 20 mg by mouth 2 (two) times daily.         REVIEW OF SYSTEMS: A comprehensive ROS was conducted with the patient and is negative except as per HPI and below:  Review of Systems  Constitutional: Negative for chills and fever.  HENT: Negative for congestion and sore throat.   Respiratory: Positive for shortness of breath. Negative for cough.   Cardiovascular: Positive for leg swelling. Negative for chest pain.  Gastrointestinal: Positive for constipation. Negative for nausea.  Musculoskeletal: Negative for neck pain.  Psychiatric/Behavioral: Negative for depression. The patient is not nervous/anxious.        OBJECTIVE:  VS: BP (!) 150/70 (BP Location: Left Arm, Patient Position: Sitting, Cuff Size: Normal)   Pulse 96   Ht _0  (1.727 m)   Wt 296 lb 3.2 oz  (134.4 kg)   SpO2 97%   BMI 45.04 kg/m    Wt Readings from Last 3 Encounters:  08/28/19 296 lb 3.2 oz (134.4 kg)  07/20/19 296 lb (134.3 kg)  07/04/19 292 lb 3.2 oz (132.5 kg)     EXAM: General: Pt appears well and is in NAD  Hydration: Well-hydrated with moist mucous membranes and good skin turgor  Eyes: External eye exam normal without stare, lid lag or exophthalmos.  EOM intact.    Ears, Nose, Throat: Hearing: Grossly intact bilaterally Throat: Clear without mass, erythema or exudate  Neck: General: Supple without adenopathy. Thyroid: Thyroid size normal.  Right nodule appreciated.  Lungs: Clear with good BS bilat with no rales, rhonchi, or wheezes  Heart: Auscultation: RRR.  Abdomen: Normoactive bowel sounds, soft, nontender, without masses or organomegaly palpable  Extremities:  BL LE: Trace pretibial edema  Skin: Hair: Texture and amount normal with gender appropriate distribution Skin Inspection: No rashes Skin Palpation: Skin temperature, texture, and thickness normal to palpation  Neuro: Cranial nerves: II - XII grossly intact  Motor: Normal strength throughout DTRs: 2+ and symmetric in UE without delay in relaxation phase  Mental Status: Judgment, insight: Intact Orientation: Oriented to time, place, and person Mood and affect: No depression, anxiety, or agitation     DATA REVIEWED: Results for JAEVEN, WANZER (MRN [id number]) as of 08/29/2019 08:03  Ref. Range 08/28/2019 14:37  TSH Latest Ref Range: 0.35 - 4.50 uIU/mL 1.56  T4,Free(Direct) Latest Ref Range: 0.60 - 1.60 ng/dL 0.86      Thyroid Nodule 07/27/2019    Nodule # 1:  Location: Right; Mid  Maximum size: 4.1 cm; Other 2 dimensions: 3.9 x 2.9 cm  Composition: mixed cystic and solid (1)  Echogenicity: hypoechoic (2)  Shape: not taller-than-wide (0)  Margins: ill-defined (0)  Echogenic foci: none (0)  ACR TI-RADS total points: 3.  ACR TI-RADS risk category: TR3 (3 points).   ACR TI-RADS recommendations:  **Given size (>/= 2.5 cm) and appearance, fine needle aspiration of this mildly suspicious nodule should be considered based on TI-RADS criteria.  _________________________________________________________  IMPRESSION: 1. Asymmetric thyromegaly secondary to large right nodule. Recommend FNA biopsy.  ASSESSMENT/PLAN/RECOMMENDATIONS:   1. Right Mid- Thyroid Nodule :   - No local neck symptoms - Clinically he is euthyroid  - TFT's are normal today  - Will proceed with FNA of the right thyroid nodule at [facility].     F/U in 6 months    Signed electronically by: Mack Guise, MD  [facility]  [facility] [address] [place], [place] [address] Phone: [phone] FAX: [phone]   CC: Sharilyn Sites, [place] [address] Phone: [phone] Fax: [phone]   Return to Endocrinology clinic as below: Future Appointments  Date Time Provider [place]  01/05/2020 10:00 AM Deneise Lever, MD [facility] None  02/28/2020  2:00 PM Oswell Say, Melanie Crazier, MD [facility] None

## 2019-08-29 ENCOUNTER — Encounter: Payer: Self-pay | Admitting: Internal Medicine

## 2019-09-14 ENCOUNTER — Other Ambulatory Visit: Payer: Self-pay

## 2019-09-14 ENCOUNTER — Ambulatory Visit (HOSPITAL_COMMUNITY)
Admission: RE | Admit: 2019-09-14 | Discharge: 2019-09-14 | Disposition: A | Discharge status: Home / Self Care | Payer: Medicare Other | Source: Ambulatory Visit | Attending: Internal Medicine | Admitting: Internal Medicine

## 2019-09-14 ENCOUNTER — Encounter (HOSPITAL_COMMUNITY): Payer: Self-pay

## 2019-09-14 DIAGNOSIS — E041 Nontoxic single thyroid nodule: Secondary | ICD-10-CM

## 2019-09-14 MED ORDER — LIDOCAINE HCL (PF) 2 % IJ SOLN
INTRAMUSCULAR | Status: AC
  Filled 2019-09-14: qty 10

## 2019-09-14 NOTE — Procedures (Signed)
PreOperative Dx: RIGHT thyroid nodule Postoperative Dx: RIGHT thyroid nodule Procedure:   US guided FNA of RIGHT thyroid nodule Radiologist:  Thornton Papas Anesthesia:  4 ml of 2% lidocaine Specimen:  FNA x 5  EBL:   < 1 ml Complications: None

## 2019-09-15 LAB — CYTOLOGY - NON PAP

## 2019-09-18 ENCOUNTER — Encounter: Payer: Self-pay | Admitting: Internal Medicine

## 2019-09-29 DIAGNOSIS — G894 Chronic pain syndrome: Secondary | ICD-10-CM | POA: Diagnosis not present

## 2019-10-10 ENCOUNTER — Encounter (HOSPITAL_COMMUNITY): Payer: Self-pay

## 2019-10-17 ENCOUNTER — Telehealth: Payer: Self-pay | Admitting: Internal Medicine

## 2019-10-17 NOTE — Telephone Encounter (Signed)
Discussed the benign results of  Afirm  with the patient . Risk of malignancy is ~4 %or less.    He has a follow up with me in April , will reassess again.       Abby Nena Jordan, MD  [facility]  [facility] [address] [place], [place] [address] Phone: [phone] FAX: [phone]

## 2019-10-26 DIAGNOSIS — H401131 Primary open-angle glaucoma, bilateral, mild stage: Secondary | ICD-10-CM | POA: Diagnosis not present

## 2019-10-26 DIAGNOSIS — E113492 Type 2 diabetes mellitus with severe nonproliferative diabetic retinopathy without macular edema, left eye: Secondary | ICD-10-CM | POA: Diagnosis not present

## 2019-10-26 DIAGNOSIS — H43811 Vitreous degeneration, right eye: Secondary | ICD-10-CM | POA: Diagnosis not present

## 2019-10-26 DIAGNOSIS — E113491 Type 2 diabetes mellitus with severe nonproliferative diabetic retinopathy without macular edema, right eye: Secondary | ICD-10-CM | POA: Diagnosis not present

## 2019-11-14 ENCOUNTER — Telehealth: Payer: Self-pay | Admitting: Urology

## 2019-11-14 NOTE — Telephone Encounter (Signed)
Patient is calling to make a routine follow up appt states he will run out of medicine in 2 weeks - Trospium.   Do you recommend me scheduling him in Feb or do you want me to schedule sooner?  Thanks

## 2019-11-15 NOTE — Telephone Encounter (Signed)
Appt made and refill sent in . Pt notified.

## 2019-11-16 NOTE — Telephone Encounter (Signed)
Spoke with pt and refills sent in for pt.

## 2019-12-08 DIAGNOSIS — L719 Rosacea, unspecified: Secondary | ICD-10-CM | POA: Diagnosis not present

## 2019-12-08 DIAGNOSIS — G894 Chronic pain syndrome: Secondary | ICD-10-CM | POA: Diagnosis not present

## 2019-12-20 ENCOUNTER — Other Ambulatory Visit: Payer: Self-pay

## 2019-12-20 ENCOUNTER — Telehealth: Payer: Self-pay | Admitting: Urology

## 2019-12-20 DIAGNOSIS — N3941 Urge incontinence: Secondary | ICD-10-CM

## 2019-12-20 MED ORDER — TROSPIUM CHLORIDE 20 MG PO TABS: 20.0000 mg | ORAL_TABLET | Freq: Two times a day (BID) | ORAL | 3 refills | Status: DC

## 2019-12-20 NOTE — Telephone Encounter (Signed)
Rx sent in

## 2019-12-20 NOTE — Telephone Encounter (Signed)
Pt requests refill on trospium to [facility] in [place]. We had to reschedule his upcoming appt. He is rescheduled in April.

## 2019-12-30 ENCOUNTER — Ambulatory Visit: Payer: Medicare Other | Attending: Internal Medicine

## 2019-12-30 DIAGNOSIS — Z23 Encounter for immunization: Secondary | ICD-10-CM | POA: Insufficient documentation

## 2019-12-30 NOTE — Progress Notes (Signed)
   [facility]  Name:  Colton Jackson.    MRN: [id number] DOB: 06-30-1951  12/30/2019  [name] was observed post Covid-19 immunization for 15 minutes without incidence. He was provided with Vaccine Information Sheet and instruction to access the V-Safe system.   [name] was instructed to call 911 with any severe reactions post vaccine: Marland Kitchen Difficulty breathing  . Swelling of your face and throat  . A fast heartbeat  . A bad rash all over your body  . Dizziness and weakness    Immunizations Administered    Name Date Dose VIS Date Route   Moderna COVID-19 Vaccine 12/30/2019 12:46 PM 0.5 mL 10/17/2019 Intramuscular   Manufacturer: Moderna   Lot: [id number]   [place]: 77939-030-09

## 2020-01-05 ENCOUNTER — Ambulatory Visit: Payer: Medicare Other | Admitting: Internal Medicine

## 2020-01-05 ENCOUNTER — Ambulatory Visit: Payer: Medicare Other | Admitting: Urology

## 2020-01-06 ENCOUNTER — Encounter: Payer: Self-pay | Admitting: Internal Medicine

## 2020-01-10 DIAGNOSIS — G894 Chronic pain syndrome: Secondary | ICD-10-CM | POA: Diagnosis not present

## 2020-01-27 ENCOUNTER — Ambulatory Visit: Payer: Medicare Other | Attending: Internal Medicine

## 2020-01-27 DIAGNOSIS — Z23 Encounter for immunization: Secondary | ICD-10-CM

## 2020-01-27 NOTE — Progress Notes (Signed)
   [id number]  Name:  Colton Jackson.    MRN: [id number] DOB: 1951/09/08  01/27/2020  [name] was observed post Covid-19 immunization for 15 minutes without incident. He was provided with Vaccine Information Sheet and instruction to access the V-Safe system.   [name] was instructed to call 911 with any severe reactions post vaccine: Marland Kitchen Difficulty breathing  . Swelling of face and throat  . A fast heartbeat  . A bad rash all over body  . Dizziness and weakness   Immunizations Administered    Name Date Dose VIS Date Route   Pfizer COVID-19 Vaccine 01/27/2020 10:25 AM 0.3 mL 10/27/2019 Intramuscular   Manufacturer: [place]   Lot: [id number]   [place]: [address]-3

## 2020-02-05 ENCOUNTER — Other Ambulatory Visit: Payer: Self-pay

## 2020-02-05 ENCOUNTER — Encounter: Payer: Self-pay | Admitting: Internal Medicine

## 2020-02-05 ENCOUNTER — Ambulatory Visit (INDEPENDENT_AMBULATORY_CARE_PROVIDER_SITE_OTHER): Payer: Medicare Other | Admitting: Internal Medicine

## 2020-02-05 VITALS — BP 122/74 | HR 88 | Temp 97.6°F | Ht 68.0 in | Wt 297.0 lb

## 2020-02-05 DIAGNOSIS — J302 Other seasonal allergic rhinitis: Secondary | ICD-10-CM | POA: Diagnosis not present

## 2020-02-05 DIAGNOSIS — J9611 Chronic respiratory failure with hypoxia: Secondary | ICD-10-CM | POA: Diagnosis not present

## 2020-02-05 DIAGNOSIS — J3089 Other allergic rhinitis: Secondary | ICD-10-CM

## 2020-02-05 DIAGNOSIS — G933 Postviral fatigue syndrome: Secondary | ICD-10-CM

## 2020-02-05 DIAGNOSIS — G4733 Obstructive sleep apnea (adult) (pediatric): Secondary | ICD-10-CM | POA: Diagnosis not present

## 2020-02-05 DIAGNOSIS — G9331 Postviral fatigue syndrome: Secondary | ICD-10-CM

## 2020-02-05 DIAGNOSIS — E662 Morbid (severe) obesity with alveolar hypoventilation: Secondary | ICD-10-CM

## 2020-02-05 MED ORDER — AMPHETAMINE-DEXTROAMPHETAMINE 10 MG PO TABS: ORAL_TABLET | ORAL | 0 refills | Status: DC

## 2020-02-05 NOTE — Patient Instructions (Signed)
Order- CXR   Dx Chrnic respiratory failure with hypoxia  Script sent for adderall 10 mg stimulant   To use 1 or 2 daily if needed  Continue Oygen 2-3 L/ minutre    Continue CPAP19,   Please call if we can help

## 2020-02-05 NOTE — Progress Notes (Signed)
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 -----OSA on CPAP 19 and O2 2-3L continuous, DME: [facility]; no complaints Dyspneic with exertion walking to exam room, but sat stayed 97% on 3L. Covid mask is smothering in hot weather- discussed. Increased DOE in past month. No cough, wheeze, palpitation or definite fluid retention.Had leg artery dopplers. Now on antibiotic for sin infection at navel. CXR 01/14/2019- IMPRESSION: Cardiomegaly without evidence of acute cardiopulmonary disease. Increased pulmonary vascularity and mild chronic peribronchial thickening again noted.  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  Doing well with CPAP and O2 as directed. Download  reviewed with him. Rebound rhinorhea in AM when he takes CPAP off.  DOE- nurse had to bring him in wheelchair from lobby. No chest pain or acute issue.  Discussed CT from last summer- some issues sto review w PCP. CTa chest 07/07/2019- IMPRESSION: 1. Technically adequate exam showing no evidence for acute large or central pulmonary embolus. 2. Enlarged pulmonary arteries consistent with pulmonary arterial hypertension. 3. Coronary artery disease. 4. Enlarged, mildly heterogeneous RIGHT lobe of the thyroid gland. Consider further evaluation with thyroid ultrasound. If patient is clinically hyperthyroid, consider nuclear medicine thyroid uptake and scan. 5. Stable appearance of sclerotic lesion in the T9 vertebral body, consistent with benign process. 6. The distal aortic arch is mildly aneurysmal, measuring 3.6 centimeters. Recommend annual imaging followup by CTA or MRA. This recommendation follows 2010 ACCF/AHA/AATS/ACR/ASA/SCA/SCAI/SIR/STS/SVM Guidelines for the Diagnosis and Management of Patients with Thoracic Aortic Disease. Circulation.2010; 121: T625-W389. Aortic aneurysm NOS (ICD10-I71.9) 7. Aortic Atherosclerosis (ICD10-I70.0). 8.  Aortic aneurysm NOS (ICD10-I71.9).  ROS-see HPI + = positive Constitutional:   No-   weight loss, night sweats, fevers, chills, +fatigue, lassitude. HEENT:   No-  headaches, difficulty swallowing, tooth/dental problems, sore throat,       No-  sneezing, itching, ear ache, nasal congestion, post nasal drip,  CV:  No-   chest pain, orthopnea, PND, swelling in lower extremities, anasarca, dizziness, palpitations Resp: + shortness of breath with exertion or at rest.              No-   productive cough,  Occasional non-productive cough,  No-  coughing up of blood.              No-   change in color of mucus.  Rare wheezing.   Skin: No-   rash or lesions. GI:  No-   heartburn, indigestion, abdominal pain, nausea, vomiting,  GU:  MS:  +   joint pain  or swelling., + back pain Neuro-  Psych:  No- change in mood or affect.  +depression or anxiety.  No memory loss.  OBJ- Physical Exam    O2 2 L General- Alert, Oriented, Affect-appropriate, Distress- none acute, +morbidly obese Skin- rash-none, lesions- none, excoriation- none Lymphadenopathy- none Head- atraumatic            Eyes- Gross vision intact, PERRLA, conjunctivae and secretions clear            Ears- Hearing, canals-normal            Nose-  no-Septal dev, mucus+, No-polyps, erosion, perforation             Throat- Mallampati IV , mucosa clear , drainage- none, tonsils- atrophic, + mild hoarseness Neck- flexible , trachea midline, no stridor , thyroid nl, carotid no bruit Chest - symmetrical excursion , unlabored           Heart/CV- RRR , no murmur , no gallop  , no rub, nl s1 s2                           - JVD+ 1 cm, edema + elastic hose, stasis changes- , varices- none           Lung- clear to P&A, + shallow consistent with body habitus, wheeze- none, cough- none , dullness-none, rub- none           Chest wall-  Abd- Br/ Gen/ Rectal- Not done, not indicated Extrem- cyanosis- none, clubbing, none, atrophy- none, strength- nl, + elastic hose Neuro- grossly intact to observation

## 2020-02-08 DIAGNOSIS — G894 Chronic pain syndrome: Secondary | ICD-10-CM | POA: Diagnosis not present

## 2020-02-14 ENCOUNTER — Telehealth: Payer: Self-pay | Admitting: Internal Medicine

## 2020-02-14 NOTE — Telephone Encounter (Signed)
PA request was received from (pharmacy): yes [facility] PJKDT:[phone] Fax:  Medication name and strength: Adderall 10 mg Ordering Provider: CY  Was PA started with CMM?: Yes If yes, please enter KEY: [id number] Medication tried and failed: n/a Covered Alternatives:   PA sent to plan, time frame for approval / denial: unknown Routing to [place] for follow-up

## 2020-02-14 NOTE — Telephone Encounter (Signed)
Spoke with pharmacy, they are aware of approval. Nothing further needed.  Medication name and strength: Adderall 10 mg PA approved/denied: approved Approval dates: n/a If denied, reason for denial:

## 2020-02-23 ENCOUNTER — Other Ambulatory Visit: Payer: Self-pay

## 2020-02-23 ENCOUNTER — Encounter: Payer: Self-pay | Admitting: Urology

## 2020-02-23 ENCOUNTER — Ambulatory Visit (INDEPENDENT_AMBULATORY_CARE_PROVIDER_SITE_OTHER): Payer: Medicare Other | Admitting: Urology

## 2020-02-23 VITALS — BP 157/78 | HR 92 | Temp 98.6°F | Ht 68.0 in | Wt 290.0 lb

## 2020-02-23 DIAGNOSIS — N3941 Urge incontinence: Secondary | ICD-10-CM

## 2020-02-23 DIAGNOSIS — R350 Frequency of micturition: Secondary | ICD-10-CM

## 2020-02-23 DIAGNOSIS — N5201 Erectile dysfunction due to arterial insufficiency: Secondary | ICD-10-CM

## 2020-02-23 LAB — POCT URINALYSIS DIPSTICK
Bilirubin, UA: NEGATIVE
Glucose, UA: NEGATIVE
Ketones, UA: NEGATIVE
Leukocytes, UA: NEGATIVE
Nitrite, UA: NEGATIVE
Protein, UA: POSITIVE — AB
Spec Grav, UA: 1.02 (ref 1.010–1.025)
Urobilinogen, UA: 0.2 E.U./dL
pH, UA: 5 (ref 5.0–8.0)

## 2020-02-23 MED ORDER — TROSPIUM CHLORIDE 20 MG PO TABS: 20.0000 mg | ORAL_TABLET | Freq: Two times a day (BID) | ORAL | 3 refills | Status: DC

## 2020-02-23 NOTE — Progress Notes (Signed)
Subjective:  1. Urge incontinence   2. Urinary frequency   3. Erectile dysfunction due to arterial insufficiency      CC: Incontinence   Colton Jackson return today in f/u. He has a several year history of OAB wet with UUI and has completed PTNS induction and was on maintenance q4wks but has been off of that for over a year.  He remains on Trospium but he has had his diuretic increased and he will have increased am frequency as a result.   He has rare nocturia. He has had no hematuria or dysuria. He has frequency q2-3hrs. He has no hesitancy but a variable stream with some intermittency. His IPSS is 20.   He has ED and has some response to sildenafil.  With the COVID he has not been active.    IPSS    Row Name 02/23/20 1300         International Prostate Symptom Score   How often have you had the sensation of not emptying your bladder?  About half the time     How often have you had to urinate less than every two hours?  More than half the time     How often have you found you stopped and started again several times when you urinated?  Almost always     How often have you found it difficult to postpone urination?  More than half the time     How often have you had a weak urinary stream?  Less than 1 in 5 times     How often have you had to strain to start urination?  Less than half the time     How many times did you typically get up at night to urinate?  1 Time     Total IPSS Score  20       Quality of Life due to urinary symptoms   If you were to spend the rest of your life with your urinary condition just the way it is now how would you feel about that?  Unhappy         ROS:  ROS:  A complete review of systems was performed.  All systems are negative except for pertinent findings as noted.   Review of Systems  HENT: Positive for sinus pain.   Respiratory: Positive for shortness of breath.   Cardiovascular: Positive for leg swelling.  Gastrointestinal: Positive for  constipation and heartburn.  Musculoskeletal: Positive for back pain and joint pain.  Skin: Positive for rash.    Allergies  Allergen Reactions  . Sulfa Antibiotics Swelling    eyelids    Outpatient Encounter Medications as of 02/23/2020  Medication Sig  . acetaminophen (TYLENOL) 500 MG tablet Take 500 mg by mouth every 6 (six) hours as needed for mild pain or moderate pain (In addition to Hydrocodone/Apap as needed for pain).  Marland Kitchen amLODipine (NORVASC) 10 MG tablet Take 1 tablet by mouth once daily (Patient taking differently: Take 10 mg by mouth daily. )  . amphetamine-dextroamphetamine (ADDERALL) 10 MG tablet 1 or 2 tabs daily if needed  . aspirin 325 MG EC tablet Take 162.5 mg by mouth daily.  . cloNIDine (CATAPRES) 0.2 MG tablet Take 1 tablet (0.2 mg total) by mouth 3 (three) times daily.  . cromolyn (NASALCROM) 5.2 MG/ACT nasal spray Place 1 spray into both nostrils 3 (three) times daily as needed for allergies.   . famotidine (PEPCID) 20 MG tablet Take 20 mg by mouth 2 (  two) times daily.  Marland Kitchen glipiZIDE (GLUCOTROL) 10 MG tablet Take 10 mg by mouth 2 (two) times daily.   Marland Kitchen HYDROcodone-acetaminophen (NORCO) 10-325 MG tablet Take 1 tablet by mouth 3 (three) times daily as needed for moderate pain.   Marland Kitchen insulin detemir (LEVEMIR) 100 UNIT/ML injection Inject 20-40 Units into the skin at bedtime as needed (for high blood sugar levels over 120).   Colton Jackson Oil 500 MG CAPS Take 500 mg by mouth daily.  Marland Kitchen latanoprost (XALATAN) 0.005 % ophthalmic solution Place 1 drop into both eyes at bedtime.    Marland Kitchen losartan (COZAAR) 100 MG tablet Take 1 tablet (100 mg total) by mouth daily.  . meclizine (ANTIVERT) 25 MG tablet Take 25 mg by mouth 3 (three) times daily as needed for dizziness or nausea (when taking hydrocodone/apap).   . meloxicam (MOBIC) 7.5 MG tablet Take 7.5 mg by mouth 2 (two) times daily.  . metFORMIN (GLUCOPHAGE-XR) 500 MG 24 hr tablet Take 1,000 mg by mouth 2 (two) times daily.  . metoprolol  succinate (TOPROL-XL) 50 MG 24 hr tablet Take 1 tablet (50 mg total) by mouth daily.  . psyllium (METAMUCIL) 58.6 % powder Take 1 packet by mouth 2 (two) times daily.  . trospium (SANCTURA) 20 MG tablet Take 1 tablet (20 mg total) by mouth 2 (two) times daily.  . [DISCONTINUED] trospium (SANCTURA) 20 MG tablet Take 1 tablet (20 mg total) by mouth 2 (two) times daily.  . furosemide (LASIX) 40 MG tablet Take 1 tablet (40 mg total) by mouth daily.   No facility-administered encounter medications on file as of 02/23/2020.    Past Medical History:  Diagnosis Date  . Allergic rhinitis   . Aneurysm artery, neck (HCC)    [name] pulmonologist  . Degenerative disk disease   . Essential hypertension   . GERD (gastroesophageal reflux disease)   . H/O cardiovascular stress test 02/02/11   EF 59% - Normal stress test  . Hearing deficit, right   . Knee pain, bilateral   . Lower back pain   . Morbid obesity ([place])   . OSA (obstructive sleep apnea)    On CPAP  . Thyroid nodule   . Type 2 diabetes mellitus ([place])     Past Surgical History:  Procedure Laterality Date  . APPENDECTOMY    . CARDIAC CATHETERIZATION    . Cataract surgery Bilateral   . COLONOSCOPY WITH PROPOFOL N/A 08/01/2019   Procedure: COLONOSCOPY WITH PROPOFOL;  Surgeon: Aviva Signs, MD;  [facility];  Service: General;  Laterality: N/A;  . KNEE ARTHROSCOPY Bilateral   . NASAL SEPTOPLASTY W/ TURBINOPLASTY    . RIGHT/LEFT HEART CATH AND CORONARY ANGIOGRAPHY N/A 06/23/2018   Procedure: RIGHT/LEFT HEART CATH AND CORONARY ANGIOGRAPHY;  Surgeon: Larey Dresser, MD;  Location: [facility];  Service: Cardiovascular;  Laterality: N/A;    Social History   Socioeconomic History  . Marital status: Widowed    Spouse name: Not on file  . Number of children: Not on file  . Years of education: Not on file  . Highest education level: Not on file  Occupational History  . Occupation: former [name], tow Futures trader   Tobacco Use  . Smoking status: Former Smoker    Quit date: 1971    Years since quitting: 50.3  . Smokeless tobacco: Never Used  . Tobacco comment: smoked when he was a teenager  Substance and Sexual Activity  . Alcohol use: No  . Drug  use: No  . Sexual activity: Not on file  Other Topics Concern  . Not on file  Social History Narrative  . Not on file   Social Determinants of Health   Financial Resource Strain:   . Difficulty of Paying Living Expenses:   Food Insecurity:   . Worried About Charity fundraiser in the Last Year:   . Arboriculturist in the Last Year:   Transportation Needs:   . Film/video editor (Medical):   Marland Kitchen Lack of Transportation (Non-Medical):   Physical Activity:   . Days of Exercise per Week:   . Minutes of Exercise per Session:   Stress:   . Feeling of Stress :   Social Connections:   . Frequency of Communication with Friends and Family:   . Frequency of Social Gatherings with Friends and Family:   . Attends Religious Services:   . Active Member of Clubs or Organizations:   . Attends Archivist Meetings:   Marland Kitchen Marital Status:   Intimate Partner Violence:   . Fear of Current or Ex-Partner:   . Emotionally Abused:   Marland Kitchen Physically Abused:   . Sexually Abused:     Family History  Problem Relation Age of Onset  . Lung disease Father   . Stroke Father 37  . Diabetes Father   . Diabetes Maternal Grandfather   . Heart disease Paternal Grandmother   . Stroke Paternal Grandfather 26  . Diabetes Paternal Grandfather   . Diabetes Mother   . Cancer Mother   . Stroke Mother 53  . Diabetes Sister   . Diabetes Brother 36       Objective: Vitals:   02/23/20 1318  BP: (!) 157/78  Pulse: 92  Temp: 98.6 F (37 C)     Physical Exam Vitals reviewed.  Constitutional:      Appearance: Normal appearance.  Neurological:     Mental Status: He is alert.     Lab Results:  Results for orders placed or performed in visit on 02/23/20  (from the past 24 hour(s))  POCT urinalysis dipstick     Status: Abnormal   Collection Time: 02/23/20  1:27 PM  Result Value Ref Range   Color, UA yellow    Clarity, UA     Glucose, UA Negative Negative   Bilirubin, UA neg    Ketones, UA neg    Spec Grav, UA 1.020 1.010 - 1.025   Blood, UA trace intact    pH, UA 5.0 5.0 - 8.0   Protein, UA Positive (A) Negative   Urobilinogen, UA 0.2 0.2 or 1.0 E.U./dL   Nitrite, UA neg    Leukocytes, UA Negative Negative   Appearance clear    Odor      BMET No results for input(s): NA, K, CL, CO2, GLUCOSE, BUN, CREATININE, CALCIUM in the last 72 hours. PSA No results found for: PSA No results found for: TESTOSTERONE    Studies/Results: No results found. I have reviewed his records and labs in Epic.   Assessment & Plan: Urge incontinence remains controlled with trospium which I have refilled.   He does have increased frequency but that is from the diuretic.  ED.   He has not been active but reports a response to sildenafil.  He had questions about tadalafil will let me know if he wants to try it.    Meds ordered this encounter  Medications  . trospium (SANCTURA) 20 MG tablet    Sig:  Take 1 tablet (20 mg total) by mouth 2 (two) times daily.    Dispense:  180 tablet    Refill:  3     Orders Placed This Encounter  Procedures  . POCT urinalysis dipstick      Return in about 1 year (around 02/22/2021).   CC: Sharilyn Sites, MD      Irine Seal 02/23/2020

## 2020-02-26 ENCOUNTER — Other Ambulatory Visit: Payer: Self-pay

## 2020-02-27 ENCOUNTER — Encounter: Payer: Self-pay | Admitting: Internal Medicine

## 2020-02-27 DIAGNOSIS — R5383 Other fatigue: Secondary | ICD-10-CM | POA: Insufficient documentation

## 2020-02-27 NOTE — Assessment & Plan Note (Signed)
He relates this to recent second Phizer Covax 1 week ago. Hopefully transient Plan- short term trial of Adderall with discussion

## 2020-02-27 NOTE — Assessment & Plan Note (Signed)
Markedly obese. Plan- encourage effort to lose weight. If he can make some progress then again consider Bariatric consult

## 2020-02-27 NOTE — Assessment & Plan Note (Signed)
Discussed use of Flonase

## 2020-02-27 NOTE — Assessment & Plan Note (Signed)
Benefits from CPAP Plan- continue CPAP 19

## 2020-02-27 NOTE — Assessment & Plan Note (Signed)
Remains dependent on O2 2-3L Significant component of OSA is likely.

## 2020-02-27 NOTE — Assessment & Plan Note (Signed)
Continue to work toward weight loss, encourage ambulation.

## 2020-02-28 ENCOUNTER — Ambulatory Visit (INDEPENDENT_AMBULATORY_CARE_PROVIDER_SITE_OTHER): Payer: Medicare Other | Admitting: Internal Medicine

## 2020-02-28 ENCOUNTER — Other Ambulatory Visit: Payer: Self-pay

## 2020-02-28 ENCOUNTER — Encounter: Payer: Self-pay | Admitting: Internal Medicine

## 2020-02-28 VITALS — BP 124/64 | HR 95 | Temp 98.3°F | Ht 68.0 in | Wt 292.8 lb

## 2020-02-28 DIAGNOSIS — E041 Nontoxic single thyroid nodule: Secondary | ICD-10-CM

## 2020-02-28 LAB — TSH: TSH: 2.09 u[IU]/mL (ref 0.35–4.50)

## 2020-02-28 LAB — T4, FREE: Free T4: 0.77 ng/dL (ref 0.60–1.60)

## 2020-02-28 NOTE — Progress Notes (Signed)
Name: Colton Jackson.  MRN/ DOB: [id number], 07/20/1951    Age/ Sex: 69 y.o., male     PCP: Sharilyn Sites, MD   Reason for Endocrinology Evaluation:  Right thyroid nodule     Initial Endocrinology Clinic Visit:  08/29/2019    PATIENT IDENTIFIER: Mr. Jemery Stacey. is a 69 y.o., male with a past medical history of HTN, DM, PAH and OSA. He has followed with [facility] since 08/29/2019 for consultative assistance with management of his right thyroid nodule.   HISTORICAL SUMMARY:  The patient was diagnosed with right thyroid nodule in August 2020, which was an incidental finding on CT angiography during evaluation of shortness of breath, this prompted a thyroid ultrasound which demonstrated a right thyroid nodule 4.1 cm; Other 2 dimensions: 3.9 x 2.9 cm. He is status post FNA on 09/14/2019 with cytology report of atypia of undetermined significance (Bethesda category III) .  Afirma came back benign    SUBJECTIVE:    Today (02/28/2020):  Mr. Reinhardt is here for a follow-up on right thyroid nodule.  Today he denies any local neck symptoms such as increased size or pain, has occasional chronic dysphagia Has chronic heart burn  Has constipation  Denies any  depression  SOB   ROS:  As per HPI.   HISTORY:  Past Medical History:  Past Medical History:  Diagnosis Date  . Allergic rhinitis   . Aneurysm artery, neck (HCC)    [name] pulmonologist  . Degenerative disk disease   . Essential hypertension   . GERD (gastroesophageal reflux disease)   . H/O cardiovascular stress test 02/02/11   EF 59% - Normal stress test  . Hearing deficit, right   . Knee pain, bilateral   . Lower back pain   . Morbid obesity ([place])   . OSA (obstructive sleep apnea)    On CPAP  . Thyroid nodule   . Type 2 diabetes mellitus ([place])    Past Surgical History:  Past Surgical History:  Procedure Laterality Date  . APPENDECTOMY    . CARDIAC CATHETERIZATION    . Cataract surgery  Bilateral   . COLONOSCOPY WITH PROPOFOL N/A 08/01/2019   Procedure: COLONOSCOPY WITH PROPOFOL;  Surgeon: Aviva Signs, MD;  [facility];  Service: General;  Laterality: N/A;  . KNEE ARTHROSCOPY Bilateral   . NASAL SEPTOPLASTY W/ TURBINOPLASTY    . RIGHT/LEFT HEART CATH AND CORONARY ANGIOGRAPHY N/A 06/23/2018   Procedure: RIGHT/LEFT HEART CATH AND CORONARY ANGIOGRAPHY;  Surgeon: Larey Dresser, MD;  Location: [facility];  Service: Cardiovascular;  Laterality: N/A;    Social History:  reports that he quit smoking about 50 years ago. He has never used smokeless tobacco. He reports that he does not drink alcohol or use drugs. Family History:  Family History  Problem Relation Age of Onset  . Lung disease Father   . Stroke Father 37  . Diabetes Father   . Diabetes Maternal Grandfather   . Heart disease Paternal Grandmother   . Stroke Paternal Grandfather 47  . Diabetes Paternal Grandfather   . Diabetes Mother   . Cancer Mother   . Stroke Mother 73  . Diabetes Sister   . Diabetes Brother 105     HOME MEDICATIONS: Allergies as of 02/28/2020      Reactions   Covid-19 Mrna Vaccine (pfizer) [covid-19 Mrna Vacc (moderna)] Other (See Comments)   Joint pain and fatigue   Sulfa Antibiotics Swelling   eyelids  Medication List       Accurate as of February 28, 2020  2:12 PM. If you have any questions, ask your nurse or doctor.        acetaminophen 500 MG tablet Commonly known as: TYLENOL Take 500 mg by mouth every 6 (six) hours as needed for mild pain or moderate pain (In addition to Hydrocodone/Apap as needed for pain).   amLODipine 10 MG tablet Commonly known as: NORVASC Take 1 tablet by mouth once daily   amphetamine-dextroamphetamine 10 MG tablet Commonly known as: ADDERALL 1 or 2 tabs daily if needed   aspirin 325 MG EC tablet Take 162.5 mg by mouth daily.   cloNIDine 0.2 MG tablet Commonly known as: Catapres Take 1 tablet (0.2 mg total) by mouth 3  (three) times daily.   cromolyn 5.2 MG/ACT nasal spray Commonly known as: NASALCROM Place 1 spray into both nostrils 3 (three) times daily as needed for allergies.   famotidine 20 MG tablet Commonly known as: PEPCID Take 20 mg by mouth 2 (two) times daily.   furosemide 40 MG tablet Commonly known as: LASIX Take 1 tablet (40 mg total) by mouth daily.   glipiZIDE 10 MG tablet Commonly known as: GLUCOTROL Take 10 mg by mouth 2 (two) times daily.   HYDROcodone-acetaminophen 10-325 MG tablet Commonly known as: NORCO Take 1 tablet by mouth 3 (three) times daily as needed for moderate pain.   insulin detemir 100 UNIT/ML injection Commonly known as: LEVEMIR Inject 20-40 Units into the skin at bedtime as needed (for high blood sugar levels over 120).   Krill Oil 500 MG Caps Take 500 mg by mouth daily.   latanoprost 0.005 % ophthalmic solution Commonly known as: XALATAN Place 1 drop into both eyes at bedtime.   losartan 100 MG tablet Commonly known as: COZAAR Take 1 tablet (100 mg total) by mouth daily.   meclizine 25 MG tablet Commonly known as: ANTIVERT Take 25 mg by mouth 3 (three) times daily as needed for dizziness or nausea (when taking hydrocodone/apap).   meloxicam 7.5 MG tablet Commonly known as: MOBIC Take 7.5 mg by mouth 2 (two) times daily.   metFORMIN 500 MG 24 hr tablet Commonly known as: GLUCOPHAGE-XR Take 1,000 mg by mouth 2 (two) times daily.   metoprolol succinate 50 MG 24 hr tablet Commonly known as: TOPROL-XL Take 1 tablet (50 mg total) by mouth daily.   OXYGEN Inhale 2 L/hr into the lungs.   psyllium 58.6 % powder Commonly known as: METAMUCIL Take 1 packet by mouth 2 (two) times daily.   trospium 20 MG tablet Commonly known as: SANCTURA Take 1 tablet (20 mg total) by mouth 2 (two) times daily.         OBJECTIVE:   PHYSICAL EXAM: VS: BP 124/64 (BP Location: Left Arm, Patient Position: Sitting, Cuff Size: Large)   Pulse 95   Temp 98.3  F (36.8 C)   Ht _0  (1.727 m)   Wt 292 lb 12.8 oz (132.8 kg)   SpO2 98% Comment: 2L O2  BMI 44.52 kg/m    EXAM: General: Pt appears well and is in NAD  Neck: General: Supple without adenopathy. Thyroid: Thyroid size normal.  Right thyroid nodule  appreciated.  Lungs: no rales, rhonchi, or wheezes  Heart: Auscultation: RRR.  Extremities:  BL LE: 1+ pretibial edema normal ROM and strength.  Mental Status: Judgment, insight: Intact Orientation: Oriented to time, place, and person Mood and affect: No depression, anxiety, or agitation  DATA REVIEWED:  Results for RIEL, HIRSCHMAN (MRN [id number]) as of 02/29/2020 07:43  Ref. Range 02/28/2020 14:16  TSH Latest Ref Range: 0.35 - 4.50 uIU/mL 2.09  T4,Free(Direct) Latest Ref Range: 0.60 - 1.60 ng/dL 0.77    FNA 09/14/2019 FINAL MICROSCOPIC DIAGNOSIS:  - Atypia of undetermined significance (Bethesda category III)   Afirma- Benign  ASSESSMENT / PLAN / RECOMMENDATIONS:   1. Right thyroid nodule:   - No local neck symptoms  - Pt is clinically euthyroid  - Repeat TFT's are normal  - He is S/P FNA 08/2019 with FLUS (Bethesda III) with benign Afirma - Will repeat thyroid ultrasound in 6 months    F/U in 6 months    Signed electronically by: Mack Guise, MD  [facility]  [address] [place],  [address] Phone: [phone] FAX: [phone]      CC: Sharilyn Sites, [place] [address] Phone: [phone]  Fax: [phone]   Return to Endocrinology clinic as below: Future Appointments  Date Time Provider [place]  08/07/2020  9:30 AM Deneise Lever, MD [facility] None  02/21/2021  1:30 PM Irine Seal, MD [facility] None

## 2020-02-29 ENCOUNTER — Encounter: Payer: Self-pay | Admitting: Internal Medicine

## 2020-03-19 DIAGNOSIS — G894 Chronic pain syndrome: Secondary | ICD-10-CM | POA: Diagnosis not present

## 2020-03-21 ENCOUNTER — Telehealth: Payer: Self-pay | Admitting: Internal Medicine

## 2020-03-21 NOTE — Telephone Encounter (Signed)
Called and spoke with [name], [employer].  [name] stated Colton Jackson is needing oxygen qualification, with OV notes for medicare. Called and spoke with Colton Jackson.  Colton Jackson stated he was contacted by [employer] stating he needed proof of O2 and cpap. Colton Jackson stated he needed to come in office for cxr, ordered by [name]. Colton Jackson scheduled with [name],NP at 1430, 03/26/20, for OV, and qualifying walk documentation. Colton Jackson is using cpap and compliance is in [place] for download.  Colton Jackson is aware cxr is ordered and he can complete after OV with [name], NP. Nothing further at this time.

## 2020-03-26 ENCOUNTER — Ambulatory Visit (INDEPENDENT_AMBULATORY_CARE_PROVIDER_SITE_OTHER): Payer: Medicare Other | Admitting: Primary Care

## 2020-03-26 ENCOUNTER — Other Ambulatory Visit: Payer: Self-pay

## 2020-03-26 ENCOUNTER — Ambulatory Visit (INDEPENDENT_AMBULATORY_CARE_PROVIDER_SITE_OTHER): Payer: Medicare Other

## 2020-03-26 ENCOUNTER — Ambulatory Visit: Payer: BLUE CROSS/BLUE SHIELD

## 2020-03-26 ENCOUNTER — Encounter: Payer: Self-pay | Admitting: Primary Care

## 2020-03-26 DIAGNOSIS — J9611 Chronic respiratory failure with hypoxia: Secondary | ICD-10-CM | POA: Diagnosis not present

## 2020-03-26 DIAGNOSIS — G4733 Obstructive sleep apnea (adult) (pediatric): Secondary | ICD-10-CM

## 2020-03-26 MED ORDER — AMPHETAMINE-DEXTROAMPHETAMINE 10 MG PO TABS: ORAL_TABLET | ORAL | 0 refills | Status: DC

## 2020-03-26 NOTE — Assessment & Plan Note (Addendum)
-  Patient present today to re-qualify for daytime oxygen. After 1 lap O2 dropped to 85% on RA; recovered 96% on 3L  - Renew order with [employer] at 3L continuous

## 2020-03-26 NOTE — Patient Instructions (Addendum)
Orders: Renew oxygen order with [employer] at 3L continuous  Needs CXR today   Recommendations: Continue to wear CPAP every 4-6 hours or more Continue 3 L oxygen continuous   Follow-up: Should already have recall in for 6 months with [name]

## 2020-03-26 NOTE — Progress Notes (Signed)
_0  ID: Colton Cecil., male    DOB: 1951-09-18, 69 y.o.   MRN: [id number]  Chief Complaint  Patient presents with  . Follow-up    f/u OSA/requalifying O2 and CPAP    Referring provider: Sharilyn Sites, MD  HPI: 69 year old male, never smoked (light smoker at age 5).  Past medical history significant for chronic hypoxic respiratory failure, obstructive sleep apnea, obesity hypoventilation syndrome, hypertension, diabetes mellitus, pulmonary hypertension. NPSG 09/22/2014 showed AHI 72.7/hour.  Optimal CPAP pressure 19 cm H2O.  Weight 332 pounds.  PFTs in 2017 showed severe obstruction and restriction with diffusion relatively high.  He takes Adderall 10 mg 1-2 daily as needed.  Dependent on oxygen 2 to 3 L.  Follow-up in 6 months.   03/26/2020 Presents today for a month follow-up.  He feels well. No acute complaints. No changes since last visit. He needs to requalify for daytime oxygen.  His DME company is [employer]. He wears 3L oxygen at home and 2L when he leaves the house to conserve oxygen on his portable tanks. He was ordered for CXR at last visit by [name] but he was unable to get it done. He experiences some shortness of breath with activities. This is his baseline. He is not currently on any maintenance inhalers. He has been tried on some in the past with little benefit. Denies cough, chest pain, chest tightness or wheezing. He has received both his covid vaccines.   Airview download: 30/30 days (100%); 30 days > 4 hours Average usage 8 hours 57 mins Pressure 19 cm h20 Airleaks 12.2L/min AHI 1.7   Allergies  Allergen Reactions  . Covid-19 Mrna Vaccine [employer]) [phone] Mrna Vacc (Moderna)] Other (See Comments)    Joint pain and fatigue  . Sulfa Antibiotics Swelling    eyelids    Immunization History  Administered Date(s) Administered  . Influenza Split 08/17/2011, 08/16/2012  . Influenza Whole 08/16/2010  . Influenza, High Dose Seasonal PF 07/26/2013, 09/09/2017,  07/16/2018  . Influenza,inj,Quad PF,6+ Mos 09/10/2015, 09/09/2016  . Moderna SARS-COVID-2 Vaccination 12/30/2019, 01/27/2020  . Zoster Recombinat (Shingrix) 12/27/2018    Past Medical History:  Diagnosis Date  . Allergic rhinitis   . Aneurysm artery, neck (HCC)    [name] pulmonologist  . Degenerative disk disease   . Essential hypertension   . GERD (gastroesophageal reflux disease)   . H/O cardiovascular stress test 02/02/11   EF 59% - Normal stress test  . Hearing deficit, right   . Knee pain, bilateral   . Lower back pain   . Morbid obesity ([place])   . OSA (obstructive sleep apnea)    On CPAP  . Thyroid nodule   . Type 2 diabetes mellitus (HCC)     Tobacco History: Social History   Tobacco Use  Smoking Status Former Smoker  . Quit date: 15  . Years since quitting: 50.3  Smokeless Tobacco Never Used  Tobacco Comment   smoked when he was a teenager   Counseling given: Not Answered Comment: smoked when he was a teenager   Outpatient Medications Prior to Visit  Medication Sig Dispense Refill  . acetaminophen (TYLENOL) 500 MG tablet Take 500 mg by mouth every 6 (six) hours as needed for mild pain or moderate pain (In addition to Hydrocodone/Apap as needed for pain).    Marland Kitchen amLODipine (NORVASC) 10 MG tablet Take 1 tablet by mouth once daily (Patient taking differently: Take 10 mg by mouth daily. ) 90 tablet 0  . aspirin 325  MG EC tablet Take 162.5 mg by mouth daily.    . cloNIDine (CATAPRES) 0.2 MG tablet Take 1 tablet (0.2 mg total) by mouth 3 (three) times daily. 90 tablet 3  . cromolyn (NASALCROM) 5.2 MG/ACT nasal spray Place 1 spray into both nostrils 3 (three) times daily as needed for allergies.     . famotidine (PEPCID) 20 MG tablet Take 20 mg by mouth 2 (two) times daily.    Marland Kitchen glipiZIDE (GLUCOTROL) 10 MG tablet Take 10 mg by mouth 2 (two) times daily.     Marland Kitchen HYDROcodone-acetaminophen (NORCO) 10-325 MG tablet Take 1 tablet by mouth 3 (three) times daily as needed  for moderate pain.     Marland Kitchen insulin detemir (LEVEMIR) 100 UNIT/ML injection Inject 20-40 Units into the skin at bedtime as needed (for high blood sugar levels over 120).     Javier Docker Oil 500 MG CAPS Take 500 mg by mouth daily.    Marland Kitchen latanoprost (XALATAN) 0.005 % ophthalmic solution Place 1 drop into both eyes at bedtime.      Marland Kitchen losartan (COZAAR) 100 MG tablet Take 1 tablet (100 mg total) by mouth daily. 30 tablet 2  . meclizine (ANTIVERT) 25 MG tablet Take 25 mg by mouth 3 (three) times daily as needed for dizziness or nausea (when taking hydrocodone/apap).     . meloxicam (MOBIC) 7.5 MG tablet Take 7.5 mg by mouth 2 (two) times daily.    . metFORMIN (GLUCOPHAGE-XR) 500 MG 24 hr tablet Take 1,000 mg by mouth 2 (two) times daily.    . metoprolol succinate (TOPROL-XL) 50 MG 24 hr tablet Take 1 tablet (50 mg total) by mouth daily. 30 tablet 3  . OXYGEN Inhale 2 L/hr into the lungs.    . psyllium (METAMUCIL) 58.6 % powder Take 1 packet by mouth 2 (two) times daily.    . trospium (SANCTURA) 20 MG tablet Take 1 tablet (20 mg total) by mouth 2 (two) times daily. 180 tablet 3  . amphetamine-dextroamphetamine (ADDERALL) 10 MG tablet 1 or 2 tabs daily if needed 60 tablet 0  . furosemide (LASIX) 40 MG tablet Take 1 tablet (40 mg total) by mouth daily. 90 tablet 3   No facility-administered medications prior to visit.    Review of Systems  Review of Systems  Respiratory: Negative for cough, chest tightness and wheezing.        Dyspnea   Physical Exam  BP (!) 172/78 (BP Location: Left Arm, Patient Position: Sitting, Cuff Size: Normal)   Pulse 96   Temp 97.6 F (36.4 C) (Temporal)   Ht _0  (1.727 m)   Wt 293 lb (132.9 kg)   SpO2 98% Comment: room air  BMI 44.55 kg/m  Physical Exam Constitutional:      Appearance: Normal appearance.  Cardiovascular:     Rate and Rhythm: Normal rate and regular rhythm.  Pulmonary:     Effort: Pulmonary effort is normal.     Breath sounds: Normal breath sounds.   Neurological:     General: No focal deficit present.     Mental Status: He is alert and oriented to person, place, and time. Mental status is at baseline.  Psychiatric:        Mood and Affect: Mood normal.        Behavior: Behavior normal.        Thought Content: Thought content normal.        Judgment: Judgment normal.      Lab Results:  CBC    Component Value Date/Time   WBC 6.6 07/04/2019 1120   RBC 4.33 07/04/2019 1120   HGB 13.4 07/04/2019 1120   HCT 40.4 07/04/2019 1120   PLT 274.0 07/04/2019 1120   MCV 93.3 07/04/2019 1120   MCH 30.7 01/18/2019 0506   MCHC 33.1 07/04/2019 1120   RDW 14.4 07/04/2019 1120   LYMPHSABS 1.9 07/04/2019 1120   MONOABS 0.5 07/04/2019 1120   EOSABS 0.2 07/04/2019 1120   BASOSABS 0.0 07/04/2019 1120    BMET    Component Value Date/Time   NA 140 07/06/2019 1205   K 3.8 07/06/2019 1205   CL 100 07/06/2019 1205   CO2 29 07/06/2019 1205   GLUCOSE 220 (H) 07/06/2019 1205   BUN 21 07/06/2019 1205   CREATININE 1.04 07/06/2019 1205   CALCIUM 10.3 07/06/2019 1205   GFRNONAA >60 01/18/2019 0506   GFRAA >60 01/18/2019 0506    BNP    Component Value Date/Time   BNP 20.5 10/09/2017 0758    ProBNP No results found for: PROBNP  Imaging: No results found.   Assessment & Plan:   Chronic respiratory failure with hypoxia ([place]) - Patient present today to re-qualify for daytime oxygen. After 1 lap O2 dropped to 85% on RA; recovered 96% on 3L  - Renew order with [employer] at 3L continuous   Obstructive sleep apnea - 100% compliant with CPAP  - Pressure 19cm h20, AHI 1.7 - No changes to current settings  - Refill adderall 17m take 1-2 tablets as needed (PMP reviewed, last refilled 02/06/20) - Due for follow-up in 6 months with Dr. YKaleen Mask NP 03/26/2020

## 2020-03-26 NOTE — Assessment & Plan Note (Addendum)
-  100% compliant with CPAP  - Pressure 19cm h20, AHI 1.7 - No changes to current settings  - Refill adderall 52m take 1-2 tablets as needed (PMP reviewed, last refilled 02/06/20) - Due for follow-up in 6 months with Dr. YAnnamaria Boots

## 2020-03-27 ENCOUNTER — Telehealth: Payer: Self-pay | Admitting: Internal Medicine

## 2020-03-27 ENCOUNTER — Other Ambulatory Visit: Payer: Self-pay

## 2020-03-27 DIAGNOSIS — J849 Interstitial pulmonary disease, unspecified: Secondary | ICD-10-CM

## 2020-03-27 NOTE — Telephone Encounter (Signed)
Call report about pt's cxr:  IMPRESSION: 1. Persistent prominent vascularity. 2. Possible LEFT lower lobe pulmonary nodule. 3. Recommend follow-up CT of the chest. 4. These results will be called to the ordering clinician or representative by the Radiologist Assistant, and communication documented in the PACS or [employer].   [facility] and spoke with Valarie Merino in regards to report. Stated to him that we are going to order CT to follow up on xray results and he verbalized understanding. Nothing further needed.

## 2020-04-08 ENCOUNTER — Other Ambulatory Visit: Payer: Self-pay

## 2020-04-08 ENCOUNTER — Ambulatory Visit (HOSPITAL_COMMUNITY)
Admission: RE | Admit: 2020-04-08 | Discharge: 2020-04-08 | Disposition: A | Discharge status: Home / Self Care | Payer: Medicare Other | Source: Ambulatory Visit | Attending: Internal Medicine | Admitting: Internal Medicine

## 2020-04-08 DIAGNOSIS — R918 Other nonspecific abnormal finding of lung field: Secondary | ICD-10-CM | POA: Diagnosis not present

## 2020-04-08 DIAGNOSIS — I700 Atherosclerosis of aorta: Secondary | ICD-10-CM | POA: Diagnosis not present

## 2020-04-08 DIAGNOSIS — J849 Interstitial pulmonary disease, unspecified: Secondary | ICD-10-CM | POA: Insufficient documentation

## 2020-04-11 ENCOUNTER — Telehealth: Payer: Self-pay | Admitting: Internal Medicine

## 2020-04-11 NOTE — Progress Notes (Signed)
Patient identification verified. Results of recent CT chest reviewed.   Per [name], CT chest- stable. There is old scarring, but not interstitial fibrosis. There are old enlarged lymph nodes without change. There is coronary artery disease with calcium deposits in arteries. No change from from imaging.  Patient verbalized understanding of results.

## 2020-04-11 NOTE — Telephone Encounter (Signed)
Patient called and results of recent CT of chest, see result note.

## 2020-04-23 DIAGNOSIS — J449 Chronic obstructive pulmonary disease, unspecified: Secondary | ICD-10-CM | POA: Diagnosis not present

## 2020-04-23 DIAGNOSIS — E785 Hyperlipidemia, unspecified: Secondary | ICD-10-CM | POA: Diagnosis not present

## 2020-04-23 DIAGNOSIS — K5903 Drug induced constipation: Secondary | ICD-10-CM | POA: Diagnosis not present

## 2020-04-23 DIAGNOSIS — E119 Type 2 diabetes mellitus without complications: Secondary | ICD-10-CM | POA: Diagnosis not present

## 2020-04-23 DIAGNOSIS — E559 Vitamin D deficiency, unspecified: Secondary | ICD-10-CM | POA: Diagnosis not present

## 2020-04-23 DIAGNOSIS — E7849 Other hyperlipidemia: Secondary | ICD-10-CM | POA: Diagnosis not present

## 2020-04-23 DIAGNOSIS — Z1389 Encounter for screening for other disorder: Secondary | ICD-10-CM | POA: Diagnosis not present

## 2020-04-23 DIAGNOSIS — I10 Essential (primary) hypertension: Secondary | ICD-10-CM | POA: Diagnosis not present

## 2020-04-23 DIAGNOSIS — G4733 Obstructive sleep apnea (adult) (pediatric): Secondary | ICD-10-CM | POA: Diagnosis not present

## 2020-04-23 DIAGNOSIS — Z9981 Dependence on supplemental oxygen: Secondary | ICD-10-CM | POA: Diagnosis not present

## 2020-04-23 DIAGNOSIS — E039 Hypothyroidism, unspecified: Secondary | ICD-10-CM | POA: Diagnosis not present

## 2020-04-23 DIAGNOSIS — Z125 Encounter for screening for malignant neoplasm of prostate: Secondary | ICD-10-CM | POA: Diagnosis not present

## 2020-04-23 DIAGNOSIS — Z6841 Body Mass Index (BMI) 40.0 and over, adult: Secondary | ICD-10-CM | POA: Diagnosis not present

## 2020-05-30 ENCOUNTER — Other Ambulatory Visit: Payer: Self-pay

## 2020-05-30 ENCOUNTER — Ambulatory Visit (INDEPENDENT_AMBULATORY_CARE_PROVIDER_SITE_OTHER): Payer: Medicare Other | Admitting: Ophthalmology

## 2020-05-30 DIAGNOSIS — H43811 Vitreous degeneration, right eye: Secondary | ICD-10-CM

## 2020-05-30 DIAGNOSIS — E113491 Type 2 diabetes mellitus with severe nonproliferative diabetic retinopathy without macular edema, right eye: Secondary | ICD-10-CM | POA: Insufficient documentation

## 2020-05-30 DIAGNOSIS — E113492 Type 2 diabetes mellitus with severe nonproliferative diabetic retinopathy without macular edema, left eye: Secondary | ICD-10-CM

## 2020-05-30 DIAGNOSIS — H401131 Primary open-angle glaucoma, bilateral, mild stage: Secondary | ICD-10-CM | POA: Insufficient documentation

## 2020-05-30 NOTE — Assessment & Plan Note (Signed)
Overall stable, I would like the patient to follow-up with a glaucoma specialist, however the patient reports seeing so many doctors and would like to maintain the status quo  Intraocular pressures however do remain stable and the size of the cup-to-disc is also stable

## 2020-05-30 NOTE — Progress Notes (Signed)
05/30/2020     CHIEF COMPLAINT Patient presents for Retina Follow Up   HISTORY OF PRESENT ILLNESS: Colton Jackson. is a 69 y.o. male who presents to the clinic today for:   HPI    Retina Follow Up    Patient presents with  Diabetic Retinopathy.  In both eyes.  This started 7 months ago.  Duration of 7 months.  Since onset it is stable.          Comments    7 month f/u Dilated exam and OCT(MAC) today.  Pt states his distance vision has gotten a little worse since last visit.  LBS was 136 this a.m and A1C was 6.3       Last edited by Melburn Popper, COA on 05/30/2020  9:20 AM. (History)      Referring physician: Sharilyn Sites, MD [address] [place],  [place] [address]  HISTORICAL INFORMATION:   Selected notes from the MEDICAL RECORD NUMBER       CURRENT MEDICATIONS: Current Outpatient Medications (Ophthalmic Drugs)  Medication Sig  . latanoprost (XALATAN) 0.005 % ophthalmic solution Place 1 drop into both eyes at bedtime.     No current facility-administered medications for this visit. (Ophthalmic Drugs)   Current Outpatient Medications (Other)  Medication Sig  . acetaminophen (TYLENOL) 500 MG tablet Take 500 mg by mouth every 6 (six) hours as needed for mild pain or moderate pain (In addition to Hydrocodone/Apap as needed for pain).  Marland Kitchen amLODipine (NORVASC) 10 MG tablet Take 1 tablet by mouth once daily (Patient taking differently: Take 10 mg by mouth daily. )  . amphetamine-dextroamphetamine (ADDERALL) 10 MG tablet 1 or 2 tabs daily if needed  . aspirin 325 MG EC tablet Take 162.5 mg by mouth daily.  . cloNIDine (CATAPRES) 0.2 MG tablet Take 1 tablet (0.2 mg total) by mouth 3 (three) times daily.  . cromolyn (NASALCROM) 5.2 MG/ACT nasal spray Place 1 spray into both nostrils 3 (three) times daily as needed for allergies.   . famotidine (PEPCID) 20 MG tablet Take 20 mg by mouth 2 (two) times daily.  . furosemide (LASIX) 40 MG tablet Take 1 tablet (40 mg  total) by mouth daily.  Marland Kitchen glipiZIDE (GLUCOTROL) 10 MG tablet Take 10 mg by mouth 2 (two) times daily.   Marland Kitchen HYDROcodone-acetaminophen (NORCO) 10-325 MG tablet Take 1 tablet by mouth 3 (three) times daily as needed for moderate pain.   Marland Kitchen insulin detemir (LEVEMIR) 100 UNIT/ML injection Inject 20-40 Units into the skin at bedtime as needed (for high blood sugar levels over 120).   Javier Docker Oil 500 MG CAPS Take 500 mg by mouth daily.  Marland Kitchen losartan (COZAAR) 100 MG tablet Take 1 tablet (100 mg total) by mouth daily.  . meclizine (ANTIVERT) 25 MG tablet Take 25 mg by mouth 3 (three) times daily as needed for dizziness or nausea (when taking hydrocodone/apap).   . meloxicam (MOBIC) 7.5 MG tablet Take 7.5 mg by mouth 2 (two) times daily.  . metFORMIN (GLUCOPHAGE-XR) 500 MG 24 hr tablet Take 1,000 mg by mouth 2 (two) times daily.  . metoprolol succinate (TOPROL-XL) 50 MG 24 hr tablet Take 1 tablet (50 mg total) by mouth daily.  . OXYGEN Inhale 2 L/hr into the lungs.  . psyllium (METAMUCIL) 58.6 % powder Take 1 packet by mouth 2 (two) times daily.  . trospium (SANCTURA) 20 MG tablet Take 1 tablet (20 mg total) by mouth 2 (two) times daily.   No  current facility-administered medications for this visit. (Other)      REVIEW OF SYSTEMS:    ALLERGIES Allergies  Allergen Reactions  . Covid-19 Mrna Vaccine [employer]) [id number] Mrna Vacc (Moderna)] Other (See Comments)    Joint pain and fatigue  . Sulfa Antibiotics Swelling    eyelids    PAST MEDICAL HISTORY Past Medical History:  Diagnosis Date  . Allergic rhinitis   . Aneurysm artery, neck (HCC)    [name] pulmonologist  . Degenerative disk disease   . Essential hypertension   . GERD (gastroesophageal reflux disease)   . H/O cardiovascular stress test 02/02/11   EF 59% - Normal stress test  . Hearing deficit, right   . Knee pain, bilateral   . Lower back pain   . Morbid obesity ([place])   . OSA (obstructive sleep apnea)    On CPAP  . Thyroid  nodule   . Type 2 diabetes mellitus ([place])    Past Surgical History:  Procedure Laterality Date  . APPENDECTOMY    . CARDIAC CATHETERIZATION    . Cataract surgery Bilateral   . COLONOSCOPY WITH PROPOFOL N/A 08/01/2019   Procedure: COLONOSCOPY WITH PROPOFOL;  Surgeon: Aviva Signs, MD;  Location: [facility];  Service: General;  Laterality: N/A;  . KNEE ARTHROSCOPY Bilateral   . NASAL SEPTOPLASTY W/ TURBINOPLASTY    . RIGHT/LEFT HEART CATH AND CORONARY ANGIOGRAPHY N/A 06/23/2018   Procedure: RIGHT/LEFT HEART CATH AND CORONARY ANGIOGRAPHY;  Surgeon: Larey Dresser, MD;  Location: [facility];  Service: Cardiovascular;  Laterality: N/A;    FAMILY HISTORY Family History  Problem Relation Age of Onset  . Lung disease Father   . Stroke Father 20  . Diabetes Father   . Diabetes Maternal Grandfather   . Heart disease Paternal Grandmother   . Stroke Paternal Grandfather 62  . Diabetes Paternal Grandfather   . Diabetes Mother   . Cancer Mother   . Stroke Mother 92  . Diabetes Sister   . Diabetes Brother 1    SOCIAL HISTORY Social History   Tobacco Use  . Smoking status: Former Smoker    Quit date: 1971    Years since quitting: 50.5  . Smokeless tobacco: Never Used  . Tobacco comment: smoked when he was a teenager  Vaping Use  . Vaping Use: Never used  Substance Use Topics  . Alcohol use: No  . Drug use: No         OPHTHALMIC EXAM:  Base Eye Exam    Visual Acuity (ETDRS)      Right Left   Dist [place] 20/50 -1 20/25   Dist ph [place] 20/20        Tonometry (Tonopen, 9:27 AM)      Right Left   Pressure 20 20       Pupils      Pupils Dark Light Shape React APD   Right PERRL 5 4 Round Slow None   Left PERRL 5 4 Round Slow None       Visual Fields (Counting fingers)      Left Right    Full Full       Extraocular Movement      Right Left    Full Full       Neuro/Psych    Oriented x3: Yes   Mood/Affect: Normal       Dilation    Both eyes: 1.0%  Mydriacyl, 2.5% Phenylephrine @ 9:27 AM  Slit Lamp and Fundus Exam    External Exam      Right Left   External Normal Normal       Slit Lamp Exam      Right Left   Lids/Lashes Normal Normal   Conjunctiva/Sclera White and quiet White and quiet   Cornea Clear Clear   Anterior Chamber Deep and quiet Deep and quiet   Iris Round and reactive Round and reactive   Lens Posterior chamber intraocular lens Posterior chamber intraocular lens   Anterior Vitreous Normal Normal       Fundus Exam      Right Left   Posterior Vitreous Normal Normal   Disc Normal Normal   C/D Ratio 0.7 0.7   Macula Microaneurysms, no clinically significant macular edema, no exudates, no macular thickening Microaneurysms, no clinically significant macular edema, no exudates, no macular thickening   Vessels NPDR-Severe NPDR-Severe   Periphery Normal Normal          IMAGING AND PROCEDURES  Imaging and Procedures for 05/30/20  OCT, Retina - OU - Both Eyes       Right Eye Quality was good. Scan locations included subfoveal. Central Foveal Thickness: 253. Progression has been stable. Findings include abnormal foveal contour.   Left Eye Quality was good. Scan locations included subfoveal. Central Foveal Thickness: 266. Progression has been stable. Findings include abnormal foveal contour, vitreomacular adhesion .   Notes Minor perifoveal changes temporal to the fovea in each eye but no active CME, no active CSME will observe  OD with PVD                ASSESSMENT/PLAN:  Severe nonproliferative diabetic retinopathy of left eye (HCC) No signs of CSME OU.  Stable  Primary open angle glaucoma of both eyes, mild stage Overall stable, I would like the patient to follow-up with a glaucoma specialist, however the patient reports seeing so many doctors and would like to maintain the status quo  Intraocular pressures however do remain stable and the size of the cup-to-disc is also stable       ICD-10-CM   1. Severe nonproliferative diabetic retinopathy of right eye without macular edema associated with type 2 diabetes mellitus (HCC)  E11.3491 OCT, Retina - OU - Both Eyes  2. Severe nonproliferative diabetic retinopathy of left eye associated with type 2 diabetes mellitus, macular edema presence unspecified (HCC)  F68.1275 OCT, Retina - OU - Both Eyes  3. Primary open angle glaucoma of both eyes, mild stage  H40.1131   4. Posterior vitreous detachment of right eye  H43.811 OCT, Retina - OU - Both Eyes    1.  Stay on topical latanoprost nightly OU.  Follow-up with Dr. Carolynn Sayers for open-angle glaucoma management ongoing would be appropriate in the future  2.  Severe NPDR OU stable  3.  Ophthalmic Meds Ordered this visit:  No orders of the defined types were placed in this encounter.      Return in about 6 months (around 11/30/2020) for DILATE OU, OCT.  There are no Patient Instructions on file for this visit.   Explained the diagnoses, plan, and follow up with the patient and they expressed understanding.  Patient expressed understanding of the importance of proper follow up care.   Clent Demark Jawanda Passey M.D. Diseases & Surgery of the Retina and Vitreous [facility]ina [facility] Diabetic [place] 05/30/20     Abbreviations: M myopia (nearsighted); A astigmatism; H hyperopia (farsighted); P presbyopia; Mrx spectacle prescription;  CTL contact  lenses; OD right eye; OS left eye; OU both eyes  XT exotropia; ET esotropia; PEK punctate epithelial keratitis; PEE punctate epithelial erosions; DES dry eye syndrome; MGD meibomian gland dysfunction; ATs artificial tears; PFAT's preservative free artificial tears; [place] nuclear sclerotic cataract; PSC posterior subcapsular cataract; ERM epi-retinal membrane; PVD posterior vitreous detachment; RD retinal detachment; DM diabetes mellitus; DR diabetic retinopathy; NPDR non-proliferative diabetic retinopathy; PDR proliferative diabetic retinopathy; CSME clinically  significant macular edema; DME diabetic macular edema; dbh dot blot hemorrhages; CWS cotton wool spot; POAG primary open angle glaucoma; C/D cup-to-disc ratio; HVF humphrey visual field; GVF goldmann visual field; OCT optical coherence tomography; IOP intraocular pressure; BRVO Branch retinal vein occlusion; CRVO central retinal vein occlusion; CRAO central retinal artery occlusion; BRAO branch retinal artery occlusion; RT retinal tear; SB scleral buckle; PPV pars plana vitrectomy; VH Vitreous hemorrhage; PRP panretinal laser photocoagulation; IVK intravitreal kenalog; VMT vitreomacular traction; MH Macular hole;  NVD neovascularization of the disc; NVE neovascularization elsewhere; AREDS age related eye disease study; ARMD age related macular degeneration; POAG primary open angle glaucoma; EBMD epithelial/anterior basement membrane dystrophy; ACIOL anterior chamber intraocular lens; IOL intraocular lens; PCIOL posterior chamber intraocular lens; Phaco/IOL phacoemulsification with intraocular lens placement; [place] photorefractive keratectomy; LASIK laser assisted in situ keratomileusis; HTN hypertension; DM diabetes mellitus; COPD chronic obstructive pulmonary disease

## 2020-05-30 NOTE — Assessment & Plan Note (Signed)
No signs of CSME OU.  Stable

## 2020-06-24 DIAGNOSIS — Z6841 Body Mass Index (BMI) 40.0 and over, adult: Secondary | ICD-10-CM | POA: Diagnosis not present

## 2020-06-24 DIAGNOSIS — G894 Chronic pain syndrome: Secondary | ICD-10-CM | POA: Diagnosis not present

## 2020-07-01 ENCOUNTER — Other Ambulatory Visit: Payer: Self-pay | Admitting: Family Medicine

## 2020-07-01 ENCOUNTER — Other Ambulatory Visit (HOSPITAL_COMMUNITY): Payer: Self-pay | Admitting: Family Medicine

## 2020-07-01 DIAGNOSIS — I712 Thoracic aortic aneurysm, without rupture, unspecified: Secondary | ICD-10-CM

## 2020-07-19 ENCOUNTER — Ambulatory Visit (HOSPITAL_COMMUNITY): Admission: RE | Admit: 2020-07-19 | Payer: Medicare Other | Source: Ambulatory Visit

## 2020-07-19 ENCOUNTER — Encounter (HOSPITAL_COMMUNITY): Payer: Self-pay

## 2020-07-26 DIAGNOSIS — Z0000 Encounter for general adult medical examination without abnormal findings: Secondary | ICD-10-CM | POA: Diagnosis not present

## 2020-07-26 DIAGNOSIS — E785 Hyperlipidemia, unspecified: Secondary | ICD-10-CM | POA: Diagnosis not present

## 2020-07-26 DIAGNOSIS — I10 Essential (primary) hypertension: Secondary | ICD-10-CM | POA: Diagnosis not present

## 2020-07-26 DIAGNOSIS — Z6841 Body Mass Index (BMI) 40.0 and over, adult: Secondary | ICD-10-CM | POA: Diagnosis not present

## 2020-07-26 DIAGNOSIS — E1165 Type 2 diabetes mellitus with hyperglycemia: Secondary | ICD-10-CM | POA: Diagnosis not present

## 2020-07-26 DIAGNOSIS — I5030 Unspecified diastolic (congestive) heart failure: Secondary | ICD-10-CM | POA: Diagnosis not present

## 2020-07-26 DIAGNOSIS — G8929 Other chronic pain: Secondary | ICD-10-CM | POA: Diagnosis not present

## 2020-07-26 DIAGNOSIS — Z1389 Encounter for screening for other disorder: Secondary | ICD-10-CM | POA: Diagnosis not present

## 2020-08-05 ENCOUNTER — Encounter (HOSPITAL_COMMUNITY): Payer: Self-pay

## 2020-08-05 ENCOUNTER — Other Ambulatory Visit: Payer: Self-pay

## 2020-08-05 ENCOUNTER — Ambulatory Visit (HOSPITAL_COMMUNITY)
Admission: RE | Admit: 2020-08-05 | Discharge: 2020-08-05 | Disposition: A | Discharge status: Home / Self Care | Payer: Medicare Other | Source: Ambulatory Visit | Attending: Family Medicine | Admitting: Family Medicine

## 2020-08-05 DIAGNOSIS — I712 Thoracic aortic aneurysm, without rupture, unspecified: Secondary | ICD-10-CM

## 2020-08-07 ENCOUNTER — Ambulatory Visit (INDEPENDENT_AMBULATORY_CARE_PROVIDER_SITE_OTHER): Payer: Medicare Other | Admitting: Internal Medicine

## 2020-08-07 ENCOUNTER — Encounter: Payer: Self-pay | Admitting: Internal Medicine

## 2020-08-07 ENCOUNTER — Other Ambulatory Visit: Payer: Self-pay

## 2020-08-07 VITALS — BP 136/70 | HR 83 | Temp 97.5°F | Ht 68.0 in | Wt 301.8 lb

## 2020-08-07 DIAGNOSIS — Z23 Encounter for immunization: Secondary | ICD-10-CM

## 2020-08-07 DIAGNOSIS — J9611 Chronic respiratory failure with hypoxia: Secondary | ICD-10-CM | POA: Diagnosis not present

## 2020-08-07 DIAGNOSIS — G4733 Obstructive sleep apnea (adult) (pediatric): Secondary | ICD-10-CM | POA: Diagnosis not present

## 2020-08-07 NOTE — Progress Notes (Signed)
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  Doing well with CPAP and O2 as directed. Download reviewed with him. Rebound rhinorhea in AM when he takes CPAP off.  DOE- nurse had to bring him in wheelchair from lobby. No chest pain or acute issue.  Discussed CT from last summer- some issues sto review w PCP. CTa chest 07/07/2019- IMPRESSION: 1. Technically adequate exam showing no evidence for acute large or central pulmonary embolus. 2. Enlarged pulmonary arteries consistent with pulmonary arterial hypertension. 3. Coronary artery disease. 4. Enlarged, mildly heterogeneous RIGHT lobe of the thyroid gland. Consider further evaluation with thyroid ultrasound. If patient is clinically hyperthyroid, consider nuclear medicine thyroid uptake and scan. 5. Stable appearance of sclerotic lesion in the T9 vertebral body, consistent with benign process. 6. The distal aortic arch is mildly aneurysmal, measuring 3.6 centimeters. Recommend annual imaging followup by CTA or MRA. This recommendation follows 2010 ACCF/AHA/AATS/ACR/ASA/SCA/SCAI/SIR/STS/SVM Guidelines for  the Diagnosis and Management of Patients with Thoracic Aortic Disease. Circulation.2010; 121: W413-K440. Aortic aneurysm NOS (ICD10-I71.9) 7. Aortic Atherosclerosis (ICD10-I70.0). 8.  Aortic aneurysm NOS (ICD10-I71.9).  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 Download-compliance 100%, AHI 1.2/ hr Body weight today- 301 lbs Covid vax- 2 Moderna O2 qualifying Walk Test 08/07/20-- desat to 87% on rrom air, 94% on 3L Flu Vax-today -----OSA, mask fits tight, and has marks on his head from it, sleeps well at night, denies being tired Activity limited by back and hip pain. Pending Ortho visit for injection. Easy DOE- gets very little exercise. Asks ok to self-pay for POC for more mobility, but wants to keep his current portable tank O2 as well.  Discussed latest CT report.  HRCT chest 04/08/20-  IMPRESSION: 1. Irregular, bandlike scarring and volume loss of the right lung, primarily involving the right lower lobe. Findings are not significantly changed compared to prior examinations and most consistent with post infectious or inflammatory scarring and volume loss. There are no specific findings to suggest fibrotic interstitial lung disease. 2. No new pulmonary nodule or other findings to explain queried abnormality of the left lung base on prior chest radiograph. 3. There are numerous prominent, hyperdense, although not pathologically enlarged mediastinal and hilar lymph nodes, unchanged compared to prior examination and most consistent with nodal involvement of sarcoidosis, although occupational inhalational lung disease may also have this appearance. 4.  Coronary artery disease. Aortic Atherosclerosis (ICD10-I70.0).  ROS-see HPI + = positive Constitutional:   No-   weight loss, night sweats, fevers, chills, +fatigue, lassitude. HEENT:   No-  headaches, difficulty swallowing, tooth/dental problems, sore  throat,       No-  sneezing, itching, ear ache, nasal congestion, post nasal drip,  CV:  No-   chest pain, orthopnea, PND, swelling in lower extremities, anasarca, dizziness, palpitations Resp: + shortness of breath with exertion or at rest.              No-   productive cough,  Occasional non-productive cough,  No-  coughing up of blood.              No-   change in color of mucus.  Rare wheezing.   Skin: No-   rash or lesions. GI:  No-   heartburn, indigestion, abdominal pain, nausea, vomiting,  GU:  MS:  +   joint pain or swelling., + back pain Neuro-  Psych:  No- change in mood or affect.  +depression or anxiety.  No memory loss.  OBJ- Physical Exam    O2 2 L General- Alert, Oriented, Affect-appropriate, Distress- none acute, +morbidly obese,  + wheelchair, carrying his portable O2 tank. Skin- rash-none, lesions- none, excoriation- none Lymphadenopathy- none Head- atraumatic            Eyes- Gross vision intact, PERRLA, conjunctivae and secretions clear            Ears- Hearing, canals-normal            Nose-  no-Septal dev, mucus+, No-polyps, erosion, perforation             Throat- Mallampati IV , mucosa clear , drainage- none, tonsils- atrophic, + mild hoarseness Neck- flexible , trachea midline, no stridor , thyroid nl, carotid no bruit Chest - symmetrical excursion , unlabored           Heart/CV- RRR , no murmur , no gallop  , no rub, nl s1 s2                           - JVD+ 1 cm, edema + elastic hose, stasis changes- , varices- none           Lung- clear to P&A, + shallow consistent with body habitus, wheeze- none, cough- none , dullness-none, rub- none           Chest wall-  Abd- Br/ Gen/ Rectal- Not done, not  indicated Extrem- cyanosis- none, clubbing, none, atrophy- none, strength- nl, + elastic hose Neuro- grossly intact to observation

## 2020-08-07 NOTE — Assessment & Plan Note (Signed)
Benefits from CPAP. Pressure is high, but not disturbing. Plan- continue CPAP 19 with O2 2-3L

## 2020-08-07 NOTE — Assessment & Plan Note (Signed)
He has not been able to change his lifestyle to accomplish weight loss.

## 2020-08-07 NOTE — Assessment & Plan Note (Addendum)
Remains dependent on O2. Confirmed again byu walk test today- very short walk. Plan- continue O2 2-3L. Ok to self-pay for a POC 3L pulse, as discussed.

## 2020-08-07 NOTE — Patient Instructions (Addendum)
Order- Flu vax senior  Ok to get a Portable Oxygen Concentrator capable of 3 liter pulse oxygen as discussed  Ok to continue your CPAP 19 and home oxygen 2L at rest, 3L with exertion, through [employer].  Please call if we can help

## 2020-08-08 ENCOUNTER — Encounter: Payer: Self-pay | Admitting: Orthopedic Surgery

## 2020-08-08 ENCOUNTER — Ambulatory Visit (INDEPENDENT_AMBULATORY_CARE_PROVIDER_SITE_OTHER): Payer: Medicare Other | Admitting: Orthopedic Surgery

## 2020-08-08 ENCOUNTER — Ambulatory Visit: Payer: Medicare Other

## 2020-08-08 VITALS — BP 133/82 | HR 87 | Ht 68.0 in | Wt 298.0 lb

## 2020-08-08 DIAGNOSIS — Z6841 Body Mass Index (BMI) 40.0 and over, adult: Secondary | ICD-10-CM

## 2020-08-08 DIAGNOSIS — M25551 Pain in right hip: Secondary | ICD-10-CM

## 2020-08-08 DIAGNOSIS — M5136 Other intervertebral disc degeneration, lumbar region: Secondary | ICD-10-CM | POA: Diagnosis not present

## 2020-08-08 DIAGNOSIS — M545 Low back pain: Secondary | ICD-10-CM

## 2020-08-08 NOTE — Progress Notes (Signed)
New patient  Chief Complaint  Patient presents with   Hip Pain    Bilateral R>L since March. NKI    Encounter Diagnoses  Name Primary?   Pain in right hip Yes   DDD (degenerative disc disease), lumbar    Body mass index 45.0-49.9, adult (HCC)    Morbid obesity (HCC)     Assessment and plan 69 year old male with congestive heart failure on oxygen hypertension diabetes BMI 45 evaluated for back and hip pain found to have degenerative disc disease with little if any arthritis in his right hip  Is not a surgical candidate return back to his primary care doctor for chronic pain management and recommend physical therapy  69 year old male with chronic pain in his lower back he has progressed up to 10 mg of hydrocodone every 6 for chronic back pain also complains of bilateral anterior thigh pain was sent in consultation by Dr. Hilma Favors for evaluation and management  He has hypertension diabetes congestive heart failure he is on oxygen for COPD and notices that he has trouble standing and has to bend over to stand for long periods of time   Review of Systems  Constitutional: Negative for chills, fever, malaise/fatigue and weight loss.  Gastrointestinal: Negative for constipation.       Denies loss bowel control   Genitourinary:       Denies urinary retention or los of bladder control     Past Medical History:  Diagnosis Date   Allergic rhinitis    Aneurysm artery, neck (HCC)    [name] pulmonologist   Degenerative disk disease    Essential hypertension    GERD (gastroesophageal reflux disease)    H/O cardiovascular stress test 02/02/11   EF 59% - Normal stress test   Hearing deficit, right    Knee pain, bilateral    Lower back pain    Morbid obesity (HCC)    OSA (obstructive sleep apnea)    On CPAP   Thyroid nodule    Type 2 diabetes mellitus ([place])    Past Surgical History:  Procedure Laterality Date   APPENDECTOMY     CARDIAC CATHETERIZATION      Cataract surgery Bilateral    COLONOSCOPY WITH PROPOFOL N/A 08/01/2019   Procedure: COLONOSCOPY WITH PROPOFOL;  Surgeon: Aviva Signs, MD;  [facility];  Service: General;  Laterality: N/A;   KNEE ARTHROSCOPY Bilateral    NASAL SEPTOPLASTY W/ TURBINOPLASTY     RIGHT/LEFT HEART CATH AND CORONARY ANGIOGRAPHY N/A 06/23/2018   Procedure: RIGHT/LEFT HEART CATH AND CORONARY ANGIOGRAPHY;  Surgeon: Larey Dresser, MD;  Location: [facility];  Service: Cardiovascular;  Laterality: N/A;    BP 133/82    Pulse 87    Ht _0  (1.727 m)    Wt 298 lb (135.2 kg)    BMI 45.31 kg/m   The patient meets the AMA guidelines for Morbid (severe) obesity with a BMI > 40.0 and I have recommended weight loss.  Physical Exam Constitutional:      General: He is not in acute distress.    Appearance: He is well-developed.  Cardiovascular:     Comments: Bilateral peripheral edema Skin:    General: Skin is warm and dry.  Neurological:     Mental Status: He is alert and oriented to person, place, and time.     Sensory: No sensory deficit.     Motor: No weakness.     Coordination: Coordination normal.     Gait:  Gait abnormal.     Deep Tendon Reflexes: Reflexes are normal and symmetric.    Lower extremities show normal hip flexion bilaterally but when he flexes the hips both legs externally rotate and extend his knees with slight flexion contractures no strength deficits distally

## 2020-08-08 NOTE — Patient Instructions (Addendum)
Follow-up with a primary care physician  Colton Jackson you have a degenerative disc condition in the lower back your hip joints are fine  We recommend physical therapy

## 2020-08-13 ENCOUNTER — Telehealth: Payer: Self-pay | Admitting: Internal Medicine

## 2020-08-13 DIAGNOSIS — J9611 Chronic respiratory failure with hypoxia: Secondary | ICD-10-CM

## 2020-08-13 NOTE — Telephone Encounter (Signed)
DME order placed for POC with [employer]. Nothing further at this time.  Per 08/07/20 AVS-  Instructions    Return in about 6 months (around 02/04/2021). Order- Flu vax senior  Ok to get a Portable Oxygen Concentrator capable of 3 liter pulse oxygen as discussed  Ok to continue your CPAP 19 and home oxygen 2L at rest, 3L with exertion, through [employer].  Please call if we can help

## 2020-08-16 ENCOUNTER — Ambulatory Visit (HOSPITAL_COMMUNITY): Payer: Medicare Other | Attending: Orthopedic Surgery | Admitting: Physical Therapy

## 2020-08-16 ENCOUNTER — Other Ambulatory Visit: Payer: Self-pay

## 2020-08-16 ENCOUNTER — Encounter (HOSPITAL_COMMUNITY): Payer: Self-pay | Admitting: Physical Therapy

## 2020-08-16 DIAGNOSIS — M6281 Muscle weakness (generalized): Secondary | ICD-10-CM | POA: Diagnosis not present

## 2020-08-16 DIAGNOSIS — M5442 Lumbago with sciatica, left side: Secondary | ICD-10-CM | POA: Diagnosis not present

## 2020-08-16 DIAGNOSIS — M5441 Lumbago with sciatica, right side: Secondary | ICD-10-CM | POA: Diagnosis not present

## 2020-08-16 NOTE — Therapy (Signed)
[facility] [address] [place], [place], [address] Phone: [phone]   Fax:  [phone]  Physical Therapy Evaluation  Patient Details  Name: Colton Jackson. MRN: [id number] Date of Birth: 1951/04/28 Referring Provider (PT): Arther Abbott    Encounter Date: 08/16/2020   PT End of Session - 08/16/20 1048    Visit Number 1    Number of Visits 12    Date for PT Re-Evaluation 09/27/20    Authorization Type Medicare    Progress Note Due on Visit 10    PT Start Time 0920    PT Stop Time 1000    PT Time Calculation (min) 40 min    Activity Tolerance Patient tolerated treatment well    Behavior During Therapy [facility] for tasks assessed/performed           Past Medical History:  Diagnosis Date  . Allergic rhinitis   . Aneurysm artery, neck (HCC)    [name] pulmonologist  . Degenerative disk disease   . Essential hypertension   . GERD (gastroesophageal reflux disease)   . H/O cardiovascular stress test 02/02/11   EF 59% - Normal stress test  . Hearing deficit, right   . Knee pain, bilateral   . Lower back pain   . Morbid obesity ([place])   . OSA (obstructive sleep apnea)    On CPAP  . Thyroid nodule   . Type 2 diabetes mellitus ([place])     Past Surgical History:  Procedure Laterality Date  . APPENDECTOMY    . CARDIAC CATHETERIZATION    . Cataract surgery Bilateral   . COLONOSCOPY WITH PROPOFOL N/A 08/01/2019   Procedure: COLONOSCOPY WITH PROPOFOL;  Surgeon: Aviva Signs, MD;  [facility];  Service: General;  Laterality: N/A;  . KNEE ARTHROSCOPY Bilateral   . NASAL SEPTOPLASTY W/ TURBINOPLASTY    . RIGHT/LEFT HEART CATH AND CORONARY ANGIOGRAPHY N/A 06/23/2018   Procedure: RIGHT/LEFT HEART CATH AND CORONARY ANGIOGRAPHY;  Surgeon: Larey Dresser, MD;  Location: [facility];  Service: Cardiovascular;  Laterality: N/A;    There were no vitals filed for this visit.    Subjective Assessment - 08/16/20 0926      Subjective Colton Jackson states that he has had increased hip pain in the front on both legs for about a month now with the right being greater than the left. The pain is greatest when he is walking but will sometimes wake him up at night.    Pertinent History HTN, O2 dependent , B OA of knees, back pain, obesity, back surgery    How long can you sit comfortably? no problem    How long can you stand comfortably? five minutes.    How long can you walk comfortably? Pt limited by breathing becomes short of breath after 30 ft of walking.  States that the hip pain tenses him up which seems to affect his breathing and limits his walking more.  Prior to his hip bothering him he could walk about 100 ft.    Patient Stated Goals To walk without the pain, be able to stand without the pain    Currently in Pain? No/denies   walking 10/10 after 158f; standing  6/10   Pain Orientation Right;Left;Anterior;Posterior    Pain Descriptors / Indicators Dull    Pain Type Acute pain    Pain Onset 1 to 4 weeks ago    Pain Frequency Intermittent    Aggravating Factors  standing  and walking    Pain Relieving Factors sittng    Effect of Pain on Daily Activities limits              [facility] PT Assessment - 08/16/20 0001      Assessment   Medical Diagnosis LBP    Referring Provider (PT) Arther Abbott     Onset Date/Surgical Date 07/17/20    Next MD Visit not scheduled     Prior Therapy none      Precautions   Precautions None      Restrictions   Weight Bearing Restrictions No      Balance Screen   Has the patient fallen in the past 6 months No    Has the patient had a decrease in activity level because of a fear of falling?  Yes    Is the patient reluctant to leave their home because of a fear of falling?  No      Home Environment   Living Environment Private residence    Home Access Level entry      Prior Function   Level of Independence Independent      Cognition   Overall Cognitive Status  Within Functional Limits for tasks assessed      Observation/Other Assessments   Focus on Therapeutic Outcomes (FOTO)  52 ; 48% affected       Functional Tests   Functional tests Sit to Stand      Sit to Stand   Comments 8 in 30"   last four labored and legs shaking pt SOB      Posture/Postural Control   Posture/Postural Control Postural limitations    Postural Limitations Rounded Shoulders;Forward head;Decreased lumbar lordosis;Increased thoracic kyphosis;Anterior pelvic tilt;Flexed trunk      ROM / Strength   AROM / PROM / Strength AROM;Strength      AROM   AROM Assessment Site Lumbar    Lumbar Flexion fingers mid point LE    reps increased pain    Lumbar Extension 25   reps improved      Strength   Strength Assessment Site Hip;Knee;Ankle    Right/Left Knee Right;Left    Right Knee Flexion 5/5    Right Knee Extension 5/5    Left Knee Flexion 5/5    Left Knee Extension 5/5    Right/Left Ankle Right;Left    Right Ankle Dorsiflexion 5/5    Left Ankle Dorsiflexion 5/5                      Objective measurements completed on examination: See above findings.       [facility] PT Treatment/Exercise - 08/16/20 0001      Exercises   Exercises Lumbar      Lumbar Exercises: Stretches   Standing Extension 5 reps      Lumbar Exercises: Seated   Sit to Stand 5 reps   2 sets    Other Seated Lumbar Exercises good posture for 10" x 5; glut set and ab set both x 5" x 5                   PT Education - 08/16/20 1048    Education Details HEP    Person(s) Educated Patient    Methods Explanation;Demonstration;Tactile cues;Verbal cues;Handout    Comprehension Verbalized understanding;Returned demonstration            PT Short Term Goals - 08/16/20 1118      PT SHORT TERM GOAL #  1   Title Pt to be I in HEP in order to decrease his pain to no greater than a 5/10 when walking 35f with O2    Time 3    Period Weeks    Status New    Target Date  09/06/20      PT SHORT TERM GOAL #2   Title PT Core and LE streghth increased as shown by being able to complete 10 sit to stand in 30 seconds.    Time 3    Period Weeks    Status New             PT Long Term Goals - 08/16/20 1121      PT LONG TERM GOAL #1   Title PT to be I in advance HEP to be able to states that he is able to ambulate 100Ft with O2 with hip pain not exceeding 2/10    Time 6    Period Weeks    Status Achieved    Target Date 09/27/20      PT LONG TERM GOAL #2   Title PT to not be waking at night time due to hip pain    Time 6    Period Weeks    Status New      PT LONG TERM GOAL #3   Title PT core and LE stength to be increased as evident by being able to complete 12 sit to stand in 30 seconds    Time 6    Period Weeks    Status New                  Plan - 08/16/20 1049    Clinical Impression Statement Colton Jackson a 69yo male who has been experiencing increased B hip pain for the past month.  He went to the orthopedic who took x-rays of his hips which showed no significant degeneration.  He has had chronic LBP with past surgery therefore he as been referred to skilled PT for back rehab.  Evaluation demonstrates significant decreased activity tolerance, this is mainly due to significant COPD with O2 dependency but is being affected by his pain as well.  He is limited in his ROM and has decreased core and hip strength.  Colton Jackson benefit from skilled PT to address these issues and return him to his prior level of functioning.    Personal Factors and Comorbidities Comorbidity 3+;Time since onset of injury/illness/exacerbation;Fitness    Comorbidities obesity, chronic LBP with surgery, COPD with O2 dependency    Examination-Activity Limitations Locomotion Level;Sleep;Stand    Examination-Participation Restrictions Cleaning;Meal Prep    Stability/Clinical Decision Making Evolving/Moderate complexity    Clinical Decision Making Moderate    Rehab  Potential Good    PT Frequency 2x / week    PT Duration 6 weeks    PT Treatment/Interventions ADLs/Self Care Home Management;Patient/family education;Manual techniques;Functional mobility training;Therapeutic activities;Therapeutic exercise;Gait training    PT Next Visit Plan check hip strength begin stabilization to include bridging, bent knee lift and clams; modified thomas stretch B    PT Home Exercise Plan sitting posture, sititng glut set; sitting ab set           Patient will benefit from skilled therapeutic intervention in order to improve the following deficits and impairments:  Cardiopulmonary status limiting activity, Decreased activity tolerance, Decreased balance, Decreased mobility, Decreased range of motion, Difficulty walking, Decreased strength, Obesity, Pain, Postural dysfunction  Visit Diagnosis: Acute bilateral low back  pain with bilateral sciatica - Plan: PT plan of care cert/re-cert  Muscle weakness (generalized) - Plan: PT plan of care cert/re-cert     Problem List Patient Active Problem List   Diagnosis Date Noted  . Severe nonproliferative diabetic retinopathy of left eye ([place]) 05/30/2020  . Severe nonproliferative diabetic retinopathy of right eye ([place]) 05/30/2020  . Primary open angle glaucoma of both eyes, mild stage 05/30/2020  . Fatigue 02/27/2020  . Special screening for malignant neoplasms, colon   . SBO (small bowel obstruction) ([place]) 01/14/2019  . Chronic respiratory failure with hypoxia ([place]) 11/12/2018  . AB (asthmatic bronchitis), moderate persistent, uncomplicated 65/78/4696  . Dyspnea on exertion 09/09/2016  . Acute bronchitis 10/01/2015  . Obesity hypoventilation syndrome ([place]) 12/21/2011  . Degenerative disc disease 12/21/2011  . Type 2 diabetes mellitus without complication ([place]) 29/52/8413  . MORBID OBESITY 06/25/2008  . Obstructive sleep apnea 06/25/2008  . Essential hypertension 06/25/2008  . Seasonal and perennial allergic rhinitis  06/25/2008  . BACK PAIN, CHRONIC 06/25/2008   Rayetta Humphrey, PT CLT [phone] 08/16/2020, 11:32 AM  [facility] [address] [place], [place], [address] Phone: [phone]   Fax:  [phone]  Name: Colton Jackson. MRN: [id number] Date of Birth: [birth year]

## 2020-08-20 ENCOUNTER — Ambulatory Visit (HOSPITAL_COMMUNITY): Payer: Medicare Other | Admitting: Physical Therapy

## 2020-08-20 ENCOUNTER — Other Ambulatory Visit: Payer: Self-pay

## 2020-08-20 ENCOUNTER — Encounter (HOSPITAL_COMMUNITY): Payer: Self-pay | Admitting: Physical Therapy

## 2020-08-20 DIAGNOSIS — M5441 Lumbago with sciatica, right side: Secondary | ICD-10-CM | POA: Diagnosis not present

## 2020-08-20 DIAGNOSIS — M6281 Muscle weakness (generalized): Secondary | ICD-10-CM

## 2020-08-20 DIAGNOSIS — M5442 Lumbago with sciatica, left side: Secondary | ICD-10-CM

## 2020-08-20 NOTE — Therapy (Signed)
[name], [place], [address] Phone: [phone]   Fax:  [phone]  Physical Therapy Treatment  Patient Details  Name: Colton Jackson. MRN: [id number] Date of Birth: 1951-06-27 Referring Provider (PT): Arther Abbott    Encounter Date: 08/20/2020   PT End of Session - 08/20/20 0905    Visit Number 2    Number of Visits 12    Date for PT Re-Evaluation 09/27/20    Authorization Type Medicare    Progress Note Due on Visit 10    PT Start Time 0835    PT Stop Time 0915    PT Time Calculation (min) 40 min    Activity Tolerance Patient tolerated treatment well    Behavior During Therapy [facility] for tasks assessed/performed           Past Medical History:  Diagnosis Date   Allergic rhinitis    Aneurysm artery, neck ([place])    [name] pulmonologist   Degenerative disk disease    Essential hypertension    GERD (gastroesophageal reflux disease)    H/O cardiovascular stress test 02/02/11   EF 59% - Normal stress test   Hearing deficit, right    Knee pain, bilateral    Lower back pain    Morbid obesity (HCC)    OSA (obstructive sleep apnea)    On CPAP   Thyroid nodule    Type 2 diabetes mellitus ([place])     Past Surgical History:  Procedure Laterality Date   APPENDECTOMY     CARDIAC CATHETERIZATION     Cataract surgery Bilateral    COLONOSCOPY WITH PROPOFOL N/A 08/01/2019   Procedure: COLONOSCOPY WITH PROPOFOL;  Surgeon: Aviva Signs, MD;  [facility];  Service: General;  Laterality: N/A;   KNEE ARTHROSCOPY Bilateral    NASAL SEPTOPLASTY W/ TURBINOPLASTY     RIGHT/LEFT HEART CATH AND CORONARY ANGIOGRAPHY N/A 06/23/2018   Procedure: RIGHT/LEFT HEART CATH AND CORONARY ANGIOGRAPHY;  Surgeon: Larey Dresser, MD;  Location: [place] [facility];  Service: Cardiovascular;  Laterality: N/A;    There were no vitals filed for this visit.   Subjective Assessment - 08/20/20 0836     Subjective Pt states that his pain medication does not seem to be strong enough.  He tried to do some of his exercises but did not get them all done.    Pertinent History HTN, O2 dependent , B OA of knees, back pain, obesity, back surgery    How long can you sit comfortably? no problem    How long can you stand comfortably? five minutes.    How long can you walk comfortably? Pt limited by breathing becomes short of breath after 30 ft of walking.  States that the hip pain tenses him up which seems to affect his breathing and limits his walking more.  Prior to his hip bothering him he could walk about 100 ft.    Patient Stated Goals To walk without the pain, be able to stand without the pain    Currently in Pain? Yes    Pain Score 4     Pain Location Leg    Pain Orientation Left;Right    Pain Descriptors / Indicators Sore    Pain Type Acute pain    Pain Onset 1 to 4 weeks ago    Pain Frequency Intermittent    Aggravating Factors  standing    Pain Relieving Factors sitting    Effect of Pain  on Daily Activities limits              [facility] PT Assessment - 08/20/20 0001      Strength   Right/Left Hip Right;Left    Right Hip Flexion 5/5    Right Hip Extension 3-/5    Right Hip ABduction 5/5    Left Hip Flexion 5/5    Left Hip Extension 3-/5    Left Hip ABduction 3+/5                         [facility] Adult PT Treatment/Exercise - 08/20/20 0001      Exercises   Exercises Lumbar      Lumbar Exercises: Stretches   Standing Extension 10 reps      Lumbar Exercises: Standing   Heel Raises 10 reps      Lumbar Exercises: Seated   Sit to Stand 10 reps    Other Seated Lumbar Exercises glut and ab set combo x 10     Other Seated Lumbar Exercises diaphragmic breathing  x 10       Lumbar Exercises: Supine   Bridge 10 reps      Lumbar Exercises: Sidelying   Hip Abduction Left;10 reps                  PT Education - 08/20/20 0905    Education Details diaphragmic  breathing and alveoli buds            PT Short Term Goals - 08/20/20 0915      PT SHORT TERM GOAL #1   Title Pt to be I in HEP in order to decrease his pain to no greater than a 5/10 when walking 54f with O2    Time 3    Period Weeks    Status On-going    Target Date 09/06/20      PT SHORT TERM GOAL #2   Title PT Core and LE streghth increased as shown by being able to complete 10 sit to stand in 30 seconds.    Time 3    Period Weeks    Status On-going             PT Long Term Goals - 08/20/20 0915      PT LONG TERM GOAL #1   Title PT to be I in advance HEP to be able to states that he is able to ambulate 100Ft with O2 with hip pain not exceeding 2/10    Time 6    Period Weeks    Status On-going      PT LONG TERM GOAL #2   Title PT to not be waking at night time due to hip pain    Time 6    Period Weeks    Status On-going      PT LONG TERM GOAL #3   Title PT core and LE stength to be increased as evident by being able to complete 12 sit to stand in 30 seconds    Time 6    Period Weeks    Status On-going                 Plan - 08/20/20 0917    Clinical Impression Statement PT significantly SOB throughout treatment.  Therapist explained why you should work on diaphragmic breathing and not use accessory mm and gave this as a HEP.  Hip mm tested with noted weakness in glut max B and Lt  glut medius. Pt continues to need skilled therapy to improve functional mobility    Personal Factors and Comorbidities Comorbidity 3+;Time since onset of injury/illness/exacerbation;Fitness    Comorbidities obesity, chronic LBP with surgery, COPD with O2 dependency    Examination-Activity Limitations Locomotion Level;Sleep;Stand    Examination-Participation Restrictions Cleaning;Meal Prep    Stability/Clinical Decision Making Evolving/Moderate complexity    Rehab Potential Good    PT Frequency 2x / week    PT Duration 6 weeks    PT Treatment/Interventions ADLs/Self Care  Home Management;Patient/family education;Manual techniques;Functional mobility training;Therapeutic activities;Therapeutic exercise;Gait training    PT Next Visit Plan check hip strength begin stabilization to include bridging, bent knee lift and clams; modified thomas stretch B    PT Home Exercise Plan sitting posture, sititng glut set; sitting ab set           Patient will benefit from skilled therapeutic intervention in order to improve the following deficits and impairments:  Cardiopulmonary status limiting activity, Decreased activity tolerance, Decreased balance, Decreased mobility, Decreased range of motion, Difficulty walking, Decreased strength, Obesity, Pain, Postural dysfunction  Visit Diagnosis: Muscle weakness (generalized)  Acute bilateral low back pain with bilateral sciatica     Problem List Patient Active Problem List   Diagnosis Date Noted   Severe nonproliferative diabetic retinopathy of left eye ([place]) 05/30/2020   Severe nonproliferative diabetic retinopathy of right eye ([place]) 05/30/2020   Primary open angle glaucoma of both eyes, mild stage 05/30/2020   Fatigue 02/27/2020   Special screening for malignant neoplasms, colon    SBO (small bowel obstruction) (HCC) 01/14/2019   Chronic respiratory failure with hypoxia (HCC) 11/12/2018   AB (asthmatic bronchitis), moderate persistent, uncomplicated 29/92/4268   Dyspnea on exertion 09/09/2016   Acute bronchitis 10/01/2015   Obesity hypoventilation syndrome ([place]) 12/21/2011   Degenerative disc disease 12/21/2011   Type 2 diabetes mellitus without complication ([place]) 34/19/6222   MORBID OBESITY 06/25/2008   Obstructive sleep apnea 06/25/2008   Essential hypertension 06/25/2008   Seasonal and perennial allergic rhinitis 06/25/2008   BACK PAIN, CHRONIC 06/25/2008    Rayetta Humphrey, PT CLT [phone] 08/20/2020, 9:21 AM  [place], [place], [address] Phone: [phone]   Fax:  [phone]  Name: Isack Lavalley. MRN: [id number] Date of Birth: 04-Jul-1951

## 2020-08-22 ENCOUNTER — Telehealth (HOSPITAL_COMMUNITY): Payer: Self-pay | Admitting: Physical Therapy

## 2020-08-22 ENCOUNTER — Ambulatory Visit (HOSPITAL_COMMUNITY): Payer: Medicare Other | Admitting: Physical Therapy

## 2020-08-22 NOTE — Telephone Encounter (Signed)
   No Show # 1:  Pt called re missed appointment.  Pt states that he tried to call but could not get through. PT states that he has not recovered from his breathing from the last session he had.  He will continue to work on his HEP and will be here next Wed.; his next appointment  Rayetta Humphrey, [place] [phone]

## 2020-08-23 ENCOUNTER — Telehealth: Payer: Self-pay | Admitting: Internal Medicine

## 2020-08-23 NOTE — Telephone Encounter (Signed)
Left message for Colton Jackson to call back.

## 2020-08-26 DIAGNOSIS — G894 Chronic pain syndrome: Secondary | ICD-10-CM | POA: Diagnosis not present

## 2020-08-26 DIAGNOSIS — Z6841 Body Mass Index (BMI) 40.0 and over, adult: Secondary | ICD-10-CM | POA: Diagnosis not present

## 2020-08-26 NOTE — Telephone Encounter (Signed)
LMTCB x2 for [employer] with [employer].

## 2020-08-27 NOTE — Telephone Encounter (Signed)
Will need to call back as they are currently closed due to the time, past 5pm.

## 2020-08-28 ENCOUNTER — Other Ambulatory Visit: Payer: Self-pay

## 2020-08-28 ENCOUNTER — Ambulatory Visit (HOSPITAL_COMMUNITY): Payer: Medicare Other | Admitting: Physical Therapy

## 2020-08-28 DIAGNOSIS — M6281 Muscle weakness (generalized): Secondary | ICD-10-CM | POA: Diagnosis not present

## 2020-08-28 DIAGNOSIS — M5442 Lumbago with sciatica, left side: Secondary | ICD-10-CM | POA: Diagnosis not present

## 2020-08-28 DIAGNOSIS — M5441 Lumbago with sciatica, right side: Secondary | ICD-10-CM

## 2020-08-28 NOTE — Therapy (Signed)
[place], [place], [address] Phone: [phone]   Fax:  [phone]  Physical Therapy Treatment  Patient Details  Name: Colton Jackson. MRN: [id number] Date of Birth: [birth year] Referring Provider (PT): Arther Abbott    Encounter Date: 08/28/2020   PT End of Session - 08/28/20 0934    Visit Number 3    Number of Visits 12    Date for PT Re-Evaluation 09/27/20    Authorization Type Medicare    Progress Note Due on Visit 10    PT Start Time 0915    PT Stop Time 0955    PT Time Calculation (min) 40 min    Activity Tolerance Patient tolerated treatment well    Behavior During Therapy [facility] for tasks assessed/performed           Past Medical History:  Diagnosis Date  . Allergic rhinitis   . Aneurysm artery, neck (HCC)    [name] pulmonologist  . Degenerative disk disease   . Essential hypertension   . GERD (gastroesophageal reflux disease)   . H/O cardiovascular stress test 02/02/11   EF 59% - Normal stress test  . Hearing deficit, right   . Knee pain, bilateral   . Lower back pain   . Morbid obesity ([place])   . OSA (obstructive sleep apnea)    On CPAP  . Thyroid nodule   . Type 2 diabetes mellitus ([place])     Past Surgical History:  Procedure Laterality Date  . APPENDECTOMY    . CARDIAC CATHETERIZATION    . Cataract surgery Bilateral   . COLONOSCOPY WITH PROPOFOL N/A 08/01/2019   Procedure: COLONOSCOPY WITH PROPOFOL;  Surgeon: Aviva Signs, MD;  [facility];  Service: General;  Laterality: N/A;  . KNEE ARTHROSCOPY Bilateral   . NASAL SEPTOPLASTY W/ TURBINOPLASTY    . RIGHT/LEFT HEART CATH AND CORONARY ANGIOGRAPHY N/A 06/23/2018   Procedure: RIGHT/LEFT HEART CATH AND CORONARY ANGIOGRAPHY;  Surgeon: Larey Dresser, MD;  Location: [place] [facility];  Service: Cardiovascular;  Laterality: N/A;    There were no vitals filed for this visit.   Subjective Assessment - 08/28/20 0918     Subjective PT states that he is doing a little better. He is doing some of his exercises but not all of them.  He is having an MRI on his heart , he has a lung problem and he can not exercise.  HE has been trying to sit up and stand taller which is helping his leg pain    Pertinent History HTN, O2 dependent , B OA of knees, back pain, obesity, back surgery    How long can you sit comfortably? no problem    How long can you stand comfortably? five minutes.    How long can you walk comfortably? Pt limited by breathing becomes short of breath after 30 ft of walking.  States that the hip pain tenses him up which seems to affect his breathing and limits his walking more.  Prior to his hip bothering him he could walk about 100 ft.    Patient Stated Goals To walk without the pain, be able to stand without the pain    Currently in Pain? No/denies    Pain Onset 1 to 4 weeks ago                 [facility] Adult PT Treatment/Exercise - 08/28/20 0001      Exercises  Exercises Lumbar      Lumbar Exercises: Stretches   Standing Extension 10 reps      Lumbar Exercises: Standing   Other Standing Lumbar Exercises wall arch x 5       Lumbar Exercises: Seated   Long Arc Quad on Chair 10 reps    Sit to Stand 5 reps      Lumbar Exercises: Supine   Bent Knee Raise 10 reps    Bridge 10 reps    Other Supine Lumbar Exercises scapular retraction     Other Supine Lumbar Exercises hip addcution x 10                     PT Short Term Goals - 08/28/20 0951      PT SHORT TERM GOAL #1   Title Pt to be I in HEP in order to decrease his pain to no greater than a 5/10 when walking 79f with O2    Time 3    Period Weeks    Status On-going    Target Date 09/06/20      PT SHORT TERM GOAL #2   Title PT Core and LE streghth increased as shown by being able to complete 10 sit to stand in 30 seconds.    Time 3    Period Weeks    Status On-going             PT Long Term Goals - 08/20/20  0915      PT LONG TERM GOAL #1   Title PT to be I in advance HEP to be able to states that he is able to ambulate 100Ft with O2 with hip pain not exceeding 2/10    Time 6    Period Weeks    Status On-going      PT LONG TERM GOAL #2   Title PT to not be waking at night time due to hip pain    Time 6    Period Weeks    Status On-going      PT LONG TERM GOAL #3   Title PT core and LE stength to be increased as evident by being able to complete 12 sit to stand in 30 seconds    Time 6    Period Weeks    Status On-going                 Plan - 08/28/20 0935    Clinical Impression Statement Pt states that he is going for a CT for his heart due to having a heart problem.  Pt states that he is unable to comptete his exercises due to lung and heart problems.  PT and therapist spoke that it may be best for pt to work on breathing and exercises at home for a while until he is more functional    Personal Factors and Comorbidities Comorbidity 3+;Time since onset of injury/illness/exacerbation;Fitness    Comorbidities obesity, chronic LBP with surgery, COPD with O2 dependency    Examination-Activity Limitations Locomotion Level;Sleep;Stand    Examination-Participation Restrictions Cleaning;Meal Prep    Stability/Clinical Decision Making Evolving/Moderate complexity    Rehab Potential Good    PT Frequency 2x / week    PT Duration 6 weeks    PT Treatment/Interventions ADLs/Self Care Home Management;Patient/family education;Manual techniques;Functional mobility training;Therapeutic activities;Therapeutic exercise;Gait training    PT Next Visit Plan Pt will be placed on hold for two weeks while getting heart testing completed.    PT Home Exercise  Plan sitting posture, sititng glut set; sitting ab set; 08/26/2020 diaphragmatic breathing, modified thomas stretch , bridge, sidelying abduction ; 10/13: sitting LAQ, hip adduction squeeze scapular retraction , wall arch           Patient will  benefit from skilled therapeutic intervention in order to improve the following deficits and impairments:  Cardiopulmonary status limiting activity, Decreased activity tolerance, Decreased balance, Decreased mobility, Decreased range of motion, Difficulty walking, Decreased strength, Obesity, Pain, Postural dysfunction  Visit Diagnosis: Muscle weakness (generalized)  Acute bilateral low back pain with bilateral sciatica     Problem List Patient Active Problem List   Diagnosis Date Noted  . Severe nonproliferative diabetic retinopathy of left eye ([place]) 05/30/2020  . Severe nonproliferative diabetic retinopathy of right eye ([place]) 05/30/2020  . Primary open angle glaucoma of both eyes, mild stage 05/30/2020  . Fatigue 02/27/2020  . Special screening for malignant neoplasms, colon   . SBO (small bowel obstruction) ([place]) 01/14/2019  . Chronic respiratory failure with hypoxia ([place]) 11/12/2018  . AB (asthmatic bronchitis), moderate persistent, uncomplicated 59/11/7239  . Dyspnea on exertion 09/09/2016  . Acute bronchitis 10/01/2015  . Obesity hypoventilation syndrome ([place]) 12/21/2011  . Degenerative disc disease 12/21/2011  . Type 2 diabetes mellitus without complication ([place]) 95/42/4814  . MORBID OBESITY 06/25/2008  . Obstructive sleep apnea 06/25/2008  . Essential hypertension 06/25/2008  . Seasonal and perennial allergic rhinitis 06/25/2008  . BACK PAIN, CHRONIC 06/25/2008   Rayetta Humphrey, PT CLT [phone] 08/28/2020, 9:52 AM  [place] [address] [place], [place], [address] Phone: [phone]   Fax:  [phone]  Name: Colton Jackson. MRN: [id number] Date of Birth: 26-Oct-1951

## 2020-08-29 NOTE — Telephone Encounter (Signed)
Called [facility] and spoke to [place] and he states [employer] needs the O2 script, pt's walking sats, and updated med list. However, when looking in the pt's referral tab its states the pt cannot have a POC but [facility] states this is incorrect. Pt's walk test and script are within 30 days of and are within 30 days of each other. There should not be a reason for issues. Staff message sent to [facility] as the call from [place] and the notes in the referral tab are contradictory.

## 2020-08-30 ENCOUNTER — Encounter (HOSPITAL_COMMUNITY): Payer: Medicare Other | Admitting: Physical Therapy

## 2020-09-02 ENCOUNTER — Encounter: Payer: Self-pay | Admitting: Internal Medicine

## 2020-09-02 ENCOUNTER — Other Ambulatory Visit: Payer: Self-pay

## 2020-09-02 ENCOUNTER — Ambulatory Visit (INDEPENDENT_AMBULATORY_CARE_PROVIDER_SITE_OTHER): Payer: Medicare Other | Admitting: Internal Medicine

## 2020-09-02 VITALS — BP 136/80 | HR 95 | Ht 68.0 in | Wt 299.1 lb

## 2020-09-02 DIAGNOSIS — E041 Nontoxic single thyroid nodule: Secondary | ICD-10-CM

## 2020-09-02 NOTE — Patient Instructions (Signed)
-  Will order your thyroid ultrasound at [facility]

## 2020-09-02 NOTE — Progress Notes (Signed)
Name: Colton Jackson.  MRN/ DOB: [id number], 12-Nov-1951    Age/ Sex: 69 y.o., male     PCP: Sharilyn Sites, MD   Reason for Endocrinology Evaluation:  Right thyroid nodule     Initial Endocrinology Clinic Visit:  08/29/2019    PATIENT IDENTIFIER: [name]. is a 69 y.o., male with a past medical history of HTN, DM, PAH and OSA. He has followed with [facility] since 08/29/2019 for consultative assistance with management of his right thyroid nodule.   HISTORICAL SUMMARY:  The patient was diagnosed with right thyroid nodule in August 2020, which was an incidental finding on CT angiography during evaluation of shortness of breath, this prompted a thyroid ultrasound which demonstrated a right thyroid nodule 4.1 cm; Other 2 dimensions: 3.9 x 2.9 cm. He is status post FNA on 09/14/2019 with cytology report of atypia of undetermined significance (Bethesda category III) .  Afirma came back benign    SUBJECTIVE:    Today (09/02/2020):  [name] is here for a follow-up on right thyroid nodule.   Today he denies any local neck symptoms such as increased size or pain, has occasional chronic dysphagia   Has chronic constipation  As well as occasional palpitations     HISTORY:  Past Medical History:  Past Medical History:  Diagnosis Date  . Allergic rhinitis   . Aneurysm artery, neck (HCC)    [name] pulmonologist  . Degenerative disk disease   . Essential hypertension   . GERD (gastroesophageal reflux disease)   . H/O cardiovascular stress test 02/02/11   EF 59% - Normal stress test  . Hearing deficit, right   . Knee pain, bilateral   . Lower back pain   . Morbid obesity ([place])   . OSA (obstructive sleep apnea)    On CPAP  . Thyroid nodule   . Type 2 diabetes mellitus ([place])    Past Surgical History:  Past Surgical History:  Procedure Laterality Date  . APPENDECTOMY    . CARDIAC CATHETERIZATION    . Cataract surgery Bilateral   . COLONOSCOPY  WITH PROPOFOL N/A 08/01/2019   Procedure: COLONOSCOPY WITH PROPOFOL;  Surgeon: Aviva Signs, MD;  [facility];  Service: Jackson;  Laterality: N/A;  . KNEE ARTHROSCOPY Bilateral   . NASAL SEPTOPLASTY W/ TURBINOPLASTY    . RIGHT/LEFT HEART CATH AND CORONARY ANGIOGRAPHY N/A 06/23/2018   Procedure: RIGHT/LEFT HEART CATH AND CORONARY ANGIOGRAPHY;  Surgeon: Larey Dresser, MD;  Location: [facility];  Service: Cardiovascular;  Laterality: N/A;    Social History:  reports that he quit smoking about 50 years ago. He has never used smokeless tobacco. He reports that he does not drink alcohol and does not use drugs. Family History:  Family History  Problem Relation Age of Onset  . Lung disease Father   . Stroke Father 35  . Diabetes Father   . Diabetes Maternal Grandfather   . Heart disease Paternal Grandmother   . Stroke Paternal Grandfather 65  . Diabetes Paternal Grandfather   . Diabetes Mother   . Cancer Mother   . Stroke Mother 32  . Diabetes Sister   . Diabetes Brother 37     HOME MEDICATIONS: Allergies as of 09/02/2020      Reactions   Covid-19 Mrna Vaccine (pfizer) [covid-19 Mrna Vacc (moderna)] Other (See Comments)   Joint pain and fatigue   Sulfa Antibiotics Swelling   eyelids      Medication  List       Accurate as of September 02, 2020  1:33 PM. If you have any questions, ask your nurse or doctor.        acetaminophen 500 MG tablet Commonly known as: TYLENOL Take 500 mg by mouth every 6 (six) hours as needed for mild pain or moderate pain (In addition to Hydrocodone/Apap as needed for pain).   amLODipine 10 MG tablet Commonly known as: NORVASC Take 1 tablet by mouth once daily   amphetamine-dextroamphetamine 10 MG tablet Commonly known as: ADDERALL 1 or 2 tabs daily if needed   aspirin 325 MG EC tablet Take 162.5 mg by mouth daily.   cloNIDine 0.2 MG tablet Commonly known as: Catapres Take 1 tablet (0.2 mg total) by mouth 3 (three) times  daily.   cromolyn 5.2 MG/ACT nasal spray Commonly known as: NASALCROM Place 1 spray into both nostrils 3 (three) times daily as needed for allergies.   famotidine 20 MG tablet Commonly known as: PEPCID Take 20 mg by mouth 2 (two) times daily.   furosemide 40 MG tablet Commonly known as: LASIX Take 1 tablet (40 mg total) by mouth daily.   glipiZIDE 10 MG tablet Commonly known as: GLUCOTROL Take 10 mg by mouth 2 (two) times daily.   HYDROcodone-acetaminophen 10-325 MG tablet Commonly known as: NORCO Take 1 tablet by mouth 3 (three) times daily as needed for moderate pain.   insulin detemir 100 UNIT/ML injection Commonly known as: LEVEMIR Inject 20-40 Units into the skin at bedtime as needed (for high blood sugar levels over 120).   Krill Oil 500 MG Caps Take 500 mg by mouth daily.   latanoprost 0.005 % ophthalmic solution Commonly known as: XALATAN Place 1 drop into both eyes at bedtime.   losartan 100 MG tablet Commonly known as: COZAAR Take 1 tablet (100 mg total) by mouth daily.   meclizine 25 MG tablet Commonly known as: ANTIVERT Take 25 mg by mouth 3 (three) times daily as needed for dizziness or nausea (when taking hydrocodone/apap).   meloxicam 7.5 MG tablet Commonly known as: MOBIC Take 7.5 mg by mouth 2 (two) times daily.   metFORMIN 500 MG 24 hr tablet Commonly known as: GLUCOPHAGE-XR Take 1,000 mg by mouth 2 (two) times daily.   metoprolol succinate 50 MG 24 hr tablet Commonly known as: TOPROL-XL Take 1 tablet (50 mg total) by mouth daily.   metroNIDAZOLE 1 % gel Commonly known as: METROGEL Apply topically daily.   mupirocin ointment 2 % Commonly known as: BACTROBAN 1 application 2 (two) times daily.   OXYGEN Inhale 2 L/hr into the lungs.   psyllium 58.6 % powder Commonly known as: METAMUCIL Take 1 packet by mouth 2 (two) times daily.   tamsulosin 0.4 MG Caps capsule Commonly known as: FLOMAX Take 0.4 mg by mouth.   trospium 20 MG  tablet Commonly known as: SANCTURA Take 1 tablet (20 mg total) by mouth 2 (two) times daily.   Vitamin D (Ergocalciferol) 1.25 MG (50000 UNIT) Caps capsule Commonly known as: DRISDOL Take by mouth.   VITAMIN DAILY PO Take 2,000 Units by mouth daily.         OBJECTIVE:   PHYSICAL EXAM: VS: BP 136/80   Pulse 95   Ht _0  (1.727 m)   Wt 299 lb 2 oz (135.7 kg)   SpO2 97%   BMI 45.48 kg/m    EXAM: Jackson: Pt appears well and is in NAD,  in place  Neck: Jackson: Supple  without adenopathy. Thyroid: Thyroid size normal.  Right thyroid nodule  appreciated.  Lungs: no rales, rhonchi, or wheezes  Heart: Auscultation: RRR.  Extremities:  BL LE: Trace  pretibial edema   Mental Status: Judgment, insight: Intact Orientation: Oriented to time, place, and person Mood and affect: No depression, anxiety, or agitation     DATA REVIEWED:  Results for MIQUAN, TANDON (MRN [id number]) as of 09/02/2020 13:34  Ref. Range 02/28/2020 14:16  TSH Latest Ref Range: 0.35 - 4.50 uIU/mL 2.09  T4,Free(Direct) Latest Ref Range: 0.60 - 1.60 ng/dL 0.77     FNA 09/14/2019 FINAL MICROSCOPIC DIAGNOSIS:  - Atypia of undetermined significance (Bethesda category III)   Afirma- Benign  ASSESSMENT / PLAN / RECOMMENDATIONS:   1. Right thyroid nodule:   - No local neck symptoms  - Pt is clinically euthyroid  - He is S/P FNA 08/2019 with FLUS (Bethesda III) with benign Afirma - Will repeat thyroid ultrasound    F/U in 1 yr    Signed electronically by: Mack Guise, MD  [facility]  [facility] [address] [place], [place] [address] Phone: [phone] FAX: [phone]      CC: Sharilyn Sites, [place] [address] Phone: [phone]  Fax: [phone]   Return to Endocrinology clinic as below: Future Appointments  Date Time Provider [place]  09/17/2020  2:45 PM Leeroy Cha, PT [facility]  None  09/19/2020  2:45 PM Zaunegger, Vianne Bulls, PT [facility] None  09/25/2020  9:15 AM Leeroy Cha, PT [facility] None  09/27/2020  4:15 PM Leeroy Cha, PT [facility] None  10/01/2020 10:00 AM Leeroy Cha, PT [facility] None  10/03/2020 10:00 AM Leeroy Cha, PT [facility] None  10/07/2020  1:45 PM Elizbeth Squires, PT [facility] None  10/09/2020  1:45 PM Elizbeth Squires, PT [facility] None  10/15/2020 10:00 AM Leeroy Cha, PT [facility] None  10/17/2020 10:00 AM Leeroy Cha, PT [facility] None  12/02/2020  9:30 AM Rankin, Clent Demark, MD [facility] None  02/04/2021 11:00 AM Deneise Lever, MD [facility] None  02/21/2021  1:15 PM Irine Seal, MD AUR-AUR None

## 2020-09-03 ENCOUNTER — Ambulatory Visit (HOSPITAL_COMMUNITY): Payer: Medicare Other

## 2020-09-03 DIAGNOSIS — I7789 Other specified disorders of arteries and arterioles: Secondary | ICD-10-CM | POA: Diagnosis not present

## 2020-09-03 DIAGNOSIS — J9811 Atelectasis: Secondary | ICD-10-CM | POA: Diagnosis not present

## 2020-09-03 DIAGNOSIS — I2510 Atherosclerotic heart disease of native coronary artery without angina pectoris: Secondary | ICD-10-CM | POA: Diagnosis not present

## 2020-09-03 DIAGNOSIS — I700 Atherosclerosis of aorta: Secondary | ICD-10-CM | POA: Diagnosis not present

## 2020-09-03 DIAGNOSIS — I712 Thoracic aortic aneurysm, without rupture: Secondary | ICD-10-CM | POA: Diagnosis not present

## 2020-09-03 DIAGNOSIS — R9389 Abnormal findings on diagnostic imaging of other specified body structures: Secondary | ICD-10-CM | POA: Diagnosis not present

## 2020-09-05 ENCOUNTER — Encounter (HOSPITAL_COMMUNITY): Payer: Medicare Other | Admitting: Physical Therapy

## 2020-09-05 NOTE — Telephone Encounter (Signed)
Per community message from [place] with [employer]:  Hello,   Colton Jackson called because we were in need of requalification paperwork. I have pulled what we need and uploaded it to the patient s account.   In regards to the POC, unfortunately the patient does not qualify.   Thank you      A follow up message has been sent. Pt has recent desaturations from 07/2020.

## 2020-09-10 ENCOUNTER — Encounter (HOSPITAL_COMMUNITY): Payer: Medicare Other

## 2020-09-10 NOTE — Telephone Encounter (Signed)
Called pt and informed him that he is unable to get a POC from [employer]. Pt verbalized understanding and states he will look into other places to get a POC and will call back if/when he needs. Will forward to [name] as Juluis Rainier that pt is unable to get POC. Nothing further needed at this time.    Herbert Deaner, RN; Sandi Raveling, [place]   In order to qualify for a POC the patient must be a brand new oxygen set up. If you have any other questions about the POC process or qualifications please reach out to [facility]. She would be able to give you and explain the qualification process for POC's.   Thanks again!!!!

## 2020-09-12 ENCOUNTER — Encounter (HOSPITAL_COMMUNITY): Payer: Medicare Other | Admitting: Physical Therapy

## 2020-09-17 ENCOUNTER — Other Ambulatory Visit: Payer: Self-pay

## 2020-09-17 ENCOUNTER — Encounter (HOSPITAL_COMMUNITY): Payer: Self-pay | Admitting: Physical Therapy

## 2020-09-17 ENCOUNTER — Ambulatory Visit (HOSPITAL_COMMUNITY): Payer: Medicare Other | Attending: Orthopedic Surgery | Admitting: Physical Therapy

## 2020-09-17 DIAGNOSIS — M5442 Lumbago with sciatica, left side: Secondary | ICD-10-CM | POA: Insufficient documentation

## 2020-09-17 DIAGNOSIS — M5441 Lumbago with sciatica, right side: Secondary | ICD-10-CM | POA: Insufficient documentation

## 2020-09-17 DIAGNOSIS — M6281 Muscle weakness (generalized): Secondary | ICD-10-CM | POA: Diagnosis not present

## 2020-09-17 NOTE — Therapy (Signed)
[place], [place], [address] Phone: [phone]   Fax:  [phone]  Physical Therapy Treatment  Patient Details  Name: Colton Jackson. MRN: [id number] Date of Birth: 1951/11/14 Referring Provider (PT): Arther Abbott    Encounter Date: 09/17/2020   PT End of Session - 09/17/20 1455    Visit Number 4    Number of Visits 12    Date for PT Re-Evaluation 09/27/20    Authorization Type Medicare    Progress Note Due on Visit 10    PT Start Time 1455   arrives late/delayed check in   PT Stop Time 1528    PT Time Calculation (min) 33 min    Activity Tolerance Patient tolerated treatment well    Behavior During Therapy [facility] for tasks assessed/performed           Past Medical History:  Diagnosis Date  . Allergic rhinitis   . Aneurysm artery, neck (HCC)    [name] pulmonologist  . Degenerative disk disease   . Essential hypertension   . GERD (gastroesophageal reflux disease)   . H/O cardiovascular stress test 02/02/11   EF 59% - Normal stress test  . Hearing deficit, right   . Knee pain, bilateral   . Lower back pain   . Morbid obesity ([place])   . OSA (obstructive sleep apnea)    On CPAP  . Thyroid nodule   . Type 2 diabetes mellitus ([place])     Past Surgical History:  Procedure Laterality Date  . APPENDECTOMY    . CARDIAC CATHETERIZATION    . Cataract surgery Bilateral   . COLONOSCOPY WITH PROPOFOL N/A 08/01/2019   Procedure: COLONOSCOPY WITH PROPOFOL;  Surgeon: Aviva Signs, MD;  [facility];  Service: General;  Laterality: N/A;  . KNEE ARTHROSCOPY Bilateral   . NASAL SEPTOPLASTY W/ TURBINOPLASTY    . RIGHT/LEFT HEART CATH AND CORONARY ANGIOGRAPHY N/A 06/23/2018   Procedure: RIGHT/LEFT HEART CATH AND CORONARY ANGIOGRAPHY;  Surgeon: Larey Dresser, MD;  Location: [facility];  Service: Cardiovascular;  Laterality: N/A;    There were no vitals filed for this visit.   Subjective  Assessment - 09/17/20 1456    Subjective Patient states he has been doing better. He had a lot of physician appointment lately. He has been walking more at home.    Pertinent History HTN, O2 dependent , B OA of knees, back pain, obesity, back surgery    How long can you sit comfortably? no problem    How long can you stand comfortably? five minutes.    How long can you walk comfortably? Pt limited by breathing becomes short of breath after 30 ft of walking.  States that the hip pain tenses him up which seems to affect his breathing and limits his walking more.  Prior to his hip bothering him he could walk about 100 ft.    Patient Stated Goals To walk without the pain, be able to stand without the pain    Currently in Pain? No/denies    Pain Onset 1 to 4 weeks ago                             [facility] Adult PT Treatment/Exercise - 09/17/20 0001      Lumbar Exercises: Stretches   Standing Extension 10 reps      Lumbar Exercises: Standing   Other Standing Lumbar  Exercises wall arch x 5     Other Standing Lumbar Exercises hip abduction at counter 1x10 bilateral       Lumbar Exercises: Seated   Long Arc Quad on Chair 10 reps    Sit to Stand 10 reps    Other Seated Lumbar Exercises seated marching 2x 10 bilateral     Other Seated Lumbar Exercises scap retraction 1x 15,                   PT Education - 09/17/20 1459    Education Details exercise mechanics and HEP    Person(s) Educated Patient    Methods Explanation;Demonstration    Comprehension Verbalized understanding;Returned demonstration            PT Short Term Goals - 08/28/20 0951      PT SHORT TERM GOAL #1   Title Pt to be I in HEP in order to decrease his pain to no greater than a 5/10 when walking 65f with O2    Time 3    Period Weeks    Status On-going    Target Date 09/06/20      PT SHORT TERM GOAL #2   Title PT Core and LE streghth increased as shown by being able to complete 10 sit to  stand in 30 seconds.    Time 3    Period Weeks    Status On-going             PT Long Term Goals - 08/20/20 0915      PT LONG TERM GOAL #1   Title PT to be I in advance HEP to be able to states that he is able to ambulate 100Ft with O2 with hip pain not exceeding 2/10    Time 6    Period Weeks    Status On-going      PT LONG TERM GOAL #2   Title PT to not be waking at night time due to hip pain    Time 6    Period Weeks    Status On-going      PT LONG TERM GOAL #3   Title PT core and LE stength to be increased as evident by being able to complete 12 sit to stand in 30 seconds    Time 6    Period Weeks    Status On-going                 Plan - 09/17/20 1456    Clinical Impression Statement Patient shows good sitting balance when completing marches without UE support but requires cueing for slow, controlled exercises due to tendency to rush. Patient requires cueing for posture with standing hip abduction with poor carry over. He c/o left hip pain with standing on LLE. He requires several seated rest breaks secondary to fatigue and SOB. Patient will continue to benefit from skilled physical therapy in order to reduce impairment and improve function.    Personal Factors and Comorbidities Comorbidity 3+;Time since onset of injury/illness/exacerbation;Fitness    Comorbidities obesity, chronic LBP with surgery, COPD with O2 dependency    Examination-Activity Limitations Locomotion Level;Sleep;Stand    Examination-Participation Restrictions Cleaning;Meal Prep    Stability/Clinical Decision Making Evolving/Moderate complexity    Rehab Potential Good    PT Frequency 2x / week    PT Duration 6 weeks    PT Treatment/Interventions ADLs/Self Care Home Management;Patient/family education;Manual techniques;Functional mobility training;Therapeutic activities;Therapeutic exercise;Gait training    PT Next Visit Plan continue to  progress strengthing exercises as able    PT Home  Exercise Plan sitting posture, sititng glut set; sitting ab set; 08/26/2020 diaphragmatic breathing, modified thomas stretch , bridge, sidelying abduction ; 10/13: sitting LAQ, hip adduction squeeze scapular retraction , wall arch           Patient will benefit from skilled therapeutic intervention in order to improve the following deficits and impairments:  Cardiopulmonary status limiting activity, Decreased activity tolerance, Decreased balance, Decreased mobility, Decreased range of motion, Difficulty walking, Decreased strength, Obesity, Pain, Postural dysfunction  Visit Diagnosis: Muscle weakness (generalized)  Acute bilateral low back pain with bilateral sciatica     Problem List Patient Active Problem List   Diagnosis Date Noted  . Severe nonproliferative diabetic retinopathy of left eye ([place]) 05/30/2020  . Severe nonproliferative diabetic retinopathy of right eye ([place]) 05/30/2020  . Primary open angle glaucoma of both eyes, mild stage 05/30/2020  . Fatigue 02/27/2020  . Special screening for malignant neoplasms, colon   . SBO (small bowel obstruction) ([place]) 01/14/2019  . Chronic respiratory failure with hypoxia ([place]) 11/12/2018  . AB (asthmatic bronchitis), moderate persistent, uncomplicated 32/54/9826  . Dyspnea on exertion 09/09/2016  . Acute bronchitis 10/01/2015  . Obesity hypoventilation syndrome ([place]) 12/21/2011  . Degenerative disc disease 12/21/2011  . Type 2 diabetes mellitus without complication ([place]) 41/58/3094  . MORBID OBESITY 06/25/2008  . Obstructive sleep apnea 06/25/2008  . Essential hypertension 06/25/2008  . Seasonal and perennial allergic rhinitis 06/25/2008  . BACK PAIN, CHRONIC 06/25/2008    3:25 PM, 09/17/20 Mearl Latin PT, DPT Physical Therapist at [facility] [place], [place], [address] Phone: [phone]   Fax:  [phone]  Name: Colton Jackson. MRN: [id number] Date of Birth: 1951-08-14

## 2020-09-19 ENCOUNTER — Encounter (HOSPITAL_COMMUNITY): Payer: Self-pay | Admitting: Physical Therapy

## 2020-09-19 ENCOUNTER — Ambulatory Visit (HOSPITAL_COMMUNITY): Payer: Medicare Other | Admitting: Physical Therapy

## 2020-09-19 ENCOUNTER — Other Ambulatory Visit: Payer: Self-pay

## 2020-09-19 DIAGNOSIS — M6281 Muscle weakness (generalized): Secondary | ICD-10-CM

## 2020-09-19 DIAGNOSIS — M5441 Lumbago with sciatica, right side: Secondary | ICD-10-CM | POA: Diagnosis not present

## 2020-09-19 DIAGNOSIS — M5442 Lumbago with sciatica, left side: Secondary | ICD-10-CM | POA: Diagnosis not present

## 2020-09-19 NOTE — Therapy (Signed)
[place], [place], [address] Phone: [phone]   Fax:  [phone]  Physical Therapy Treatment  Patient Details  Name: Onofre Gains. MRN: [id number] Date of Birth: [birth year] Referring Provider (PT): Arther Abbott    Encounter Date: 09/19/2020   PT End of Session - 09/19/20 1453    Visit Number 5    Number of Visits 12    Date for PT Re-Evaluation 09/27/20    Authorization Type Medicare    Progress Note Due on Visit 10    PT Start Time 1452    PT Stop Time 1530    PT Time Calculation (min) 38 min    Activity Tolerance Patient tolerated treatment well    Behavior During Therapy [facility] for tasks assessed/performed           Past Medical History:  Diagnosis Date  . Allergic rhinitis   . Aneurysm artery, neck (HCC)    [name] pulmonologist  . Degenerative disk disease   . Essential hypertension   . GERD (gastroesophageal reflux disease)   . H/O cardiovascular stress test 02/02/11   EF 59% - Normal stress test  . Hearing deficit, right   . Knee pain, bilateral   . Lower back pain   . Morbid obesity ([place])   . OSA (obstructive sleep apnea)    On CPAP  . Thyroid nodule   . Type 2 diabetes mellitus ([place])     Past Surgical History:  Procedure Laterality Date  . APPENDECTOMY    . CARDIAC CATHETERIZATION    . Cataract surgery Bilateral   . COLONOSCOPY WITH PROPOFOL N/A 08/01/2019   Procedure: COLONOSCOPY WITH PROPOFOL;  Surgeon: Aviva Signs, MD;  [facility];  Service: General;  Laterality: N/A;  . KNEE ARTHROSCOPY Bilateral   . NASAL SEPTOPLASTY W/ TURBINOPLASTY    . RIGHT/LEFT HEART CATH AND CORONARY ANGIOGRAPHY N/A 06/23/2018   Procedure: RIGHT/LEFT HEART CATH AND CORONARY ANGIOGRAPHY;  Surgeon: Larey Dresser, MD;  Location: [place] CV LAB;  Service: Cardiovascular;  Laterality: N/A;    There were no vitals filed for this visit.   Subjective Assessment - 09/19/20 1454     Subjective Patient states he was pretty sore in his right hip yesterday and today. He did not do his exercises because he was sore.    Pertinent History HTN, O2 dependent , B OA of knees, back pain, obesity, back surgery    How long can you sit comfortably? no problem    How long can you stand comfortably? five minutes.    How long can you walk comfortably? Pt limited by breathing becomes short of breath after 30 ft of walking.  States that the hip pain tenses him up which seems to affect his breathing and limits his walking more.  Prior to his hip bothering him he could walk about 100 ft.    Patient Stated Goals To walk without the pain, be able to stand without the pain    Currently in Pain? No/denies    Pain Onset 1 to 4 weeks ago                             [facility] Adult PT Treatment/Exercise - 09/19/20 0001      Lumbar Exercises: Stretches   Standing Extension 10 reps      Lumbar Exercises: Standing   Heel Raises 10 reps  Heel Raises Limitations toe raises x 10     Other Standing Lumbar Exercises wall arch x 5       Lumbar Exercises: Seated   Long Arc Quad on Chair 10 reps    LAQ on Chair Weights (lbs) 2    LAQ on Chair Limitations 5 second holds    Sit to Stand 10 reps    Other Seated Lumbar Exercises seated marching 2x 10 bilateral     Other Seated Lumbar Exercises scap retraction 1x 15, row with green band 2x10, seated lumbar flexion 1x10                   PT Education - 09/19/20 1450    Education Details Patient educated on HEP, exercise mechanics    Person(s) Educated Patient    Methods Explanation;Demonstration    Comprehension Verbalized understanding;Returned demonstration            PT Short Term Goals - 08/28/20 0951      PT SHORT TERM GOAL #1   Title Pt to be I in HEP in order to decrease his pain to no greater than a 5/10 when walking 66f with O2    Time 3    Period Weeks    Status On-going    Target Date 09/06/20      PT  SHORT TERM GOAL #2   Title PT Core and LE streghth increased as shown by being able to complete 10 sit to stand in 30 seconds.    Time 3    Period Weeks    Status On-going             PT Long Term Goals - 08/20/20 0915      PT LONG TERM GOAL #1   Title PT to be I in advance HEP to be able to states that he is able to ambulate 100Ft with O2 with hip pain not exceeding 2/10    Time 6    Period Weeks    Status On-going      PT LONG TERM GOAL #2   Title PT to not be waking at night time due to hip pain    Time 6    Period Weeks    Status On-going      PT LONG TERM GOAL #3   Title PT core and LE stength to be increased as evident by being able to complete 12 sit to stand in 30 seconds    Time 6    Period Weeks    Status On-going                 Plan - 09/19/20 1453    Clinical Impression Statement Patient tolerates seated exercises well at beginning of session without increase in symptoms. Patient requires frequent rest breaks due to impaired activity tolerance, endurance and SOB. Patient has greatest difficulty with wall arch exercise and shows improving posture and mechanics. Patient fatigues most quickly with standing exercises. Patient will benefit from skilled physical therapy in order to improve function and reduce impairment.    Personal Factors and Comorbidities Comorbidity 3+;Time since onset of injury/illness/exacerbation;Fitness    Comorbidities obesity, chronic LBP with surgery, COPD with O2 dependency    Examination-Activity Limitations Locomotion Level;Sleep;Stand    Examination-Participation Restrictions Cleaning;Meal Prep    Stability/Clinical Decision Making Evolving/Moderate complexity    Rehab Potential Good    PT Frequency 2x / week    PT Duration 6 weeks    PT Treatment/Interventions ADLs/Self  Care Home Management;Patient/family education;Manual techniques;Functional mobility training;Therapeutic activities;Therapeutic exercise;Gait training    PT  Next Visit Plan continue to progress strengthing exercises as able    PT Home Exercise Plan sitting posture, sititng glut set; sitting ab set; 08/26/2020 diaphragmatic breathing, modified thomas stretch , bridge, sidelying abduction ; 10/13: sitting LAQ, hip adduction squeeze scapular retraction , wall arch           Patient will benefit from skilled therapeutic intervention in order to improve the following deficits and impairments:  Cardiopulmonary status limiting activity, Decreased activity tolerance, Decreased balance, Decreased mobility, Decreased range of motion, Difficulty walking, Decreased strength, Obesity, Pain, Postural dysfunction  Visit Diagnosis: Muscle weakness (generalized)  Acute bilateral low back pain with bilateral sciatica     Problem List Patient Active Problem List   Diagnosis Date Noted  . Severe nonproliferative diabetic retinopathy of left eye ([place]) 05/30/2020  . Severe nonproliferative diabetic retinopathy of right eye ([place]) 05/30/2020  . Primary open angle glaucoma of both eyes, mild stage 05/30/2020  . Fatigue 02/27/2020  . Special screening for malignant neoplasms, colon   . SBO (small bowel obstruction) ([place]) 01/14/2019  . Chronic respiratory failure with hypoxia ([place]) 11/12/2018  . AB (asthmatic bronchitis), moderate persistent, uncomplicated 69/79/4801  . Dyspnea on exertion 09/09/2016  . Acute bronchitis 10/01/2015  . Obesity hypoventilation syndrome ([place]) 12/21/2011  . Degenerative disc disease 12/21/2011  . Type 2 diabetes mellitus without complication ([place]) 65/53/7482  . MORBID OBESITY 06/25/2008  . Obstructive sleep apnea 06/25/2008  . Essential hypertension 06/25/2008  . Seasonal and perennial allergic rhinitis 06/25/2008  . BACK PAIN, CHRONIC 06/25/2008    3:26 PM, 09/19/20 Mearl Latin PT, DPT Physical Therapist at [place], [place], [address] Phone: [phone]   Fax:  [phone]  Name: Jamen Loiseau. MRN: [id number] Date of Birth: 1951/04/28

## 2020-09-24 DIAGNOSIS — G894 Chronic pain syndrome: Secondary | ICD-10-CM | POA: Diagnosis not present

## 2020-09-24 DIAGNOSIS — Z6841 Body Mass Index (BMI) 40.0 and over, adult: Secondary | ICD-10-CM | POA: Diagnosis not present

## 2020-09-24 DIAGNOSIS — E119 Type 2 diabetes mellitus without complications: Secondary | ICD-10-CM | POA: Diagnosis not present

## 2020-09-25 ENCOUNTER — Ambulatory Visit (HOSPITAL_COMMUNITY): Payer: Medicare Other | Admitting: Physical Therapy

## 2020-09-25 ENCOUNTER — Other Ambulatory Visit: Payer: Self-pay

## 2020-09-25 ENCOUNTER — Encounter (HOSPITAL_COMMUNITY): Payer: Self-pay | Admitting: Physical Therapy

## 2020-09-25 DIAGNOSIS — M6281 Muscle weakness (generalized): Secondary | ICD-10-CM

## 2020-09-25 DIAGNOSIS — M5441 Lumbago with sciatica, right side: Secondary | ICD-10-CM | POA: Diagnosis not present

## 2020-09-25 DIAGNOSIS — M5442 Lumbago with sciatica, left side: Secondary | ICD-10-CM | POA: Diagnosis not present

## 2020-09-25 NOTE — Therapy (Signed)
[place], [place], [address] Phone: [phone]   Fax:  [phone]  Physical Therapy Treatment  Patient Details  Name: Colton Jackson. MRN: [id number] Date of Birth: [birth year] Referring Provider (PT): Arther Abbott    Encounter Date: 09/25/2020   PT End of Session - 09/25/20 1024    Visit Number 6    Number of Visits 16    Date for PT Re-Evaluation 10/30/20    Authorization Type Medicare    Progress Note Due on Visit 16    PT Start Time 0917    PT Stop Time 1005    PT Time Calculation (min) 48 min    Activity Tolerance Patient tolerated treatment well    Behavior During Therapy [facility] for tasks assessed/performed           Past Medical History:  Diagnosis Date  . Allergic rhinitis   . Aneurysm artery, neck (HCC)    [name] pulmonologist  . Degenerative disk disease   . Essential hypertension   . GERD (gastroesophageal reflux disease)   . H/O cardiovascular stress test 02/02/11   EF 59% - Normal stress test  . Hearing deficit, right   . Knee pain, bilateral   . Lower back pain   . Morbid obesity ([place])   . OSA (obstructive sleep apnea)    On CPAP  . Thyroid nodule   . Type 2 diabetes mellitus ([place])     Past Surgical History:  Procedure Laterality Date  . APPENDECTOMY    . CARDIAC CATHETERIZATION    . Cataract surgery Bilateral   . COLONOSCOPY WITH PROPOFOL N/A 08/01/2019   Procedure: COLONOSCOPY WITH PROPOFOL;  Surgeon: Aviva Signs, MD;  [facility];  Service: General;  Laterality: N/A;  . KNEE ARTHROSCOPY Bilateral   . NASAL SEPTOPLASTY W/ TURBINOPLASTY    . RIGHT/LEFT HEART CATH AND CORONARY ANGIOGRAPHY N/A 06/23/2018   Procedure: RIGHT/LEFT HEART CATH AND CORONARY ANGIOGRAPHY;  Surgeon: Larey Dresser, MD;  Location: [place] CV LAB;  Service: Cardiovascular;  Laterality: N/A;    There were no vitals filed for this visit.   Subjective Assessment - 09/25/20 0917     Subjective Colton Jackson states that he feels that he is about 20% improved since starting therapy.  He sates walking is a little better.    Pertinent History HTN, O2 dependent , B OA of knees, back pain, obesity, back surgery    How long can you sit comfortably? no problem    How long can you stand comfortably? 10 minutes was five    How long can you walk comfortably? Pt limited by breathing becomes short of breath after 50 ft was 30 ft. .  States that the hip pain tenses him up which seems to affect his breathing and limits his walking more.  Prior to his hip bothering him he could walk about 100 ft.    Patient Stated Goals To walk without the pain, be able to stand without the pain    Currently in Pain? Yes    Pain Score 5    no pain with sitting but increased pain with waliking   Pain Location Hip    Pain Orientation Right;Left    Pain Descriptors / Indicators Sore    Pain Type Chronic pain    Pain Onset 1 to 4 weeks ago    Pain Frequency Intermittent    Aggravating Factors  standing  walking  Pain Relieving Factors sitting    Effect of Pain on Daily Activities limits              [facility] PT Assessment - 09/25/20 0001      Assessment   Medical Diagnosis LBP    Referring Provider (PT) Arther Abbott     Onset Date/Surgical Date 07/17/20    Next MD Visit not scheduled     Prior Therapy none      Precautions   Precautions None      Restrictions   Weight Bearing Restrictions No      Balance Screen   Has the patient fallen in the past 6 months No    Has the patient had a decrease in activity level because of a fear of falling?  Yes    Is the patient reluctant to leave their home because of a fear of falling?  No      Home Environment   Living Environment Private residence    Home Access Level entry      Prior Function   Level of Independence Independent      Cognition   Overall Cognitive Status Within Functional Limits for tasks assessed      Observation/Other  Assessments   Focus on Therapeutic Outcomes (FOTO)  53 was 52       Functional Tests   Functional tests Sit to Stand      Sit to Stand   Comments 8 in 30"   PT had no shaking was  last four labored and legs shaking      Posture/Postural Control   Posture/Postural Control Postural limitations    Postural Limitations Rounded Shoulders;Forward head;Decreased lumbar lordosis;Increased thoracic kyphosis;Anterior pelvic tilt;Flexed trunk      AROM   Lumbar Flexion fingers lower 1/3 was  mid point LE    reps increased pain    Lumbar Extension 30 was 25    reps improved      Strength   Right Hip Flexion 5/5    Right Hip Extension 3/5   was 3-   Right Hip ABduction 5/5    Left Hip Flexion 5/5    Left Hip Extension 3+/5   was 3-   Left Hip ABduction 4/5   was 3+    Right Knee Flexion 5/5    Right Knee Extension 5/5    Left Knee Flexion 5/5    Left Knee Extension 5/5    Right Ankle Dorsiflexion 5/5    Left Ankle Dorsiflexion 5/5                         [facility] Adult PT Treatment/Exercise - 09/25/20 0001      Ambulation/Gait   Ambulation Distance (Feet) 100 Feet    Gait Comments PT carrying O2, significant SOB at end       Exercises   Exercises Lumbar      Lumbar Exercises: Stretches   Standing Extension 5 reps      Lumbar Exercises: Standing   Heel Raises 10 reps      Lumbar Exercises: Seated   Sit to Stand 10 reps                  PT Education - 09/25/20 1023    Education Details Supine piriformis stretch    Person(s) Educated Patient    Methods Explanation;Demonstration    Comprehension Verbalized understanding            PT  Short Term Goals - 09/25/20 0937      PT SHORT TERM GOAL #1   Title Pt to be I in HEP in order to decrease his pain to no greater than a 5/10 when walking 75f with O2    Time 3    Period Weeks    Status Achieved    Target Date 09/06/20      PT SHORT TERM GOAL #2   Title PT Core and LE streghth increased as shown  by being able to complete 10 sit to stand in 30 seconds.    Time 3    Period Weeks    Status On-going             PT Long Term Goals - 09/25/20 09381     PT LONG TERM GOAL #1   Title PT to be I in advance HEP to be able to states that he is able to ambulate 100Ft with O2 with hip pain not exceeding 2/10    Time 6    Period Weeks    Status Achieved   no pain just SOB     PT LONG TERM GOAL #2   Title PT to not be waking at night time due to hip pain    Time 6    Period Weeks    Status On-going   Pt continues to wake 3x a night due to hip pain.,     PT LONG TERM GOAL #3   Title PT core and LE stength to be increased as evident by being able to complete 12 sit to stand in 30 seconds    Time 6    Period Weeks    Status On-going                 Plan - 09/25/20 1025    Clinical Impression Statement Pt reassessed today with noted progress towards goals.  Pt has improved in strength, ROM and has decreased pain.  He continues to wake multiple times at night secondary to pain.  HE is O2 dependent and is unable to lie down without his c-pap therefore all exercises in department must be done sitting or standing.  Pt will continue to benefit from skilled PT recommend extending treatment for 2 more weeks for a total of 8 weeks to improve functional tolerance and LE strength.    Personal Factors and Comorbidities Comorbidity 3+;Time since onset of injury/illness/exacerbation;Fitness    Comorbidities obesity, chronic LBP with surgery, COPD with O2 dependency    Examination-Activity Limitations Locomotion Level;Sleep;Stand    Examination-Participation Restrictions Cleaning;Meal Prep    Stability/Clinical Decision Making Evolving/Moderate complexity    Rehab Potential Good    PT Frequency 2x / week    PT Duration 8 weeks    PT Treatment/Interventions ADLs/Self Care Home Management;Patient/family education;Manual techniques;Functional mobility training;Therapeutic activities;Therapeutic  exercise;Gait training    PT Next Visit Plan continue to progress strengthing exercises sitting and standing progressing to more standing than sitting    PT Home Exercise Plan sitting posture, sititng glut set; sitting ab set; 08/26/2020 diaphragmatic breathing, modified thomas stretch , bridge, sidelying abduction ; 10/13: sitting LAQ, hip adduction squeeze scapular retraction , wall arch           Patient will benefit from skilled therapeutic intervention in order to improve the following deficits and impairments:  Cardiopulmonary status limiting activity, Decreased activity tolerance, Decreased balance, Decreased mobility, Decreased range of motion, Difficulty walking, Decreased strength, Obesity, Pain, Postural dysfunction  Visit  Diagnosis: Muscle weakness (generalized)  Acute bilateral low back pain with bilateral sciatica     Problem List Patient Active Problem List   Diagnosis Date Noted  . Severe nonproliferative diabetic retinopathy of left eye ([place]) 05/30/2020  . Severe nonproliferative diabetic retinopathy of right eye ([place]) 05/30/2020  . Primary open angle glaucoma of both eyes, mild stage 05/30/2020  . Fatigue 02/27/2020  . Special screening for malignant neoplasms, colon   . SBO (small bowel obstruction) ([place]) 01/14/2019  . Chronic respiratory failure with hypoxia ([place]) 11/12/2018  . AB (asthmatic bronchitis), moderate persistent, uncomplicated 39/79/5369  . Dyspnea on exertion 09/09/2016  . Acute bronchitis 10/01/2015  . Obesity hypoventilation syndrome ([place]) 12/21/2011  . Degenerative disc disease 12/21/2011  . Type 2 diabetes mellitus without complication ([place]) 22/30/0979  . MORBID OBESITY 06/25/2008  . Obstructive sleep apnea 06/25/2008  . Essential hypertension 06/25/2008  . Seasonal and perennial allergic rhinitis 06/25/2008  . BACK PAIN, CHRONIC 06/25/2008   Rayetta Humphrey, PT CLT [phone] 09/25/2020, 10:32 AM  [place] [address] [place], [place], [address] Phone: [phone]   Fax:  [phone]  Name: Colton Jackson. MRN: [id number] Date of Birth: Jul 14, 1951

## 2020-09-26 DIAGNOSIS — Z23 Encounter for immunization: Secondary | ICD-10-CM | POA: Diagnosis not present

## 2020-09-27 ENCOUNTER — Ambulatory Visit (HOSPITAL_COMMUNITY): Payer: Medicare Other | Admitting: Physical Therapy

## 2020-09-27 ENCOUNTER — Telehealth (HOSPITAL_COMMUNITY): Payer: Self-pay | Admitting: Physical Therapy

## 2020-09-27 NOTE — Telephone Encounter (Signed)
pt cancelled appt for today because he got his COVID booster on yesterday and he is very sore

## 2020-10-01 ENCOUNTER — Telehealth (HOSPITAL_COMMUNITY): Payer: Self-pay | Admitting: Physical Therapy

## 2020-10-01 ENCOUNTER — Ambulatory Visit (HOSPITAL_COMMUNITY): Payer: Medicare Other | Admitting: Physical Therapy

## 2020-10-01 NOTE — Telephone Encounter (Signed)
No show #1  Called Pt re missed appointment.  Pt not home left message that his next appointment will be on Thursday.  Rayetta Humphrey, [place] CLT [phone]

## 2020-10-02 ENCOUNTER — Encounter (HOSPITAL_COMMUNITY): Payer: Self-pay | Admitting: Physical Therapy

## 2020-10-02 NOTE — Therapy (Unsigned)
[place] [address] [place], [place], [address] Phone: [phone]   Fax:  [phone]  Patient Details  Name: Colton Jackson. MRN: [id number] Date of Birth: 1951-02-25 Referring Provider:  Arther Abbott  Encounter Date: 10/02/2020   PHYSICAL THERAPY DISCHARGE SUMMARY  Visits from Start of Care: 6  Current functional level related to goals / functional outcomes: See below   Remaining deficits: See below   Education / Equipment: HEP Plan: Patient agrees to discharge.  Patient goals were not met. Patient is being discharged due to meeting the stated rehab goals.  ?????      Observation/Other Assessments    Focus on Therapeutic Outcomes (FOTO)  53 was 52         Functional Tests   Functional tests Sit to Stand        Sit to Stand   Comments 8 in 30"   PT had no shaking was  last four labored and legs shaking        Posture/Postural Control   Posture/Postural Control Postural limitations    Postural Limitations Rounded Shoulders;Forward head;Decreased lumbar lordosis;Increased thoracic kyphosis;Anterior pelvic tilt;Flexed trunk        AROM   Lumbar Flexion fingers lower 1/3 was  mid point LE    reps increased pain    Lumbar Extension 30 was 25    reps improved        Strength   Right Hip Flexion 5/5    Right Hip Extension 3/5   was 3-   Right Hip ABduction 5/5    Left Hip Flexion 5/5    Left Hip Extension 3+/5   was 3-   Left Hip ABduction 4/5   was 3+    Right Knee Flexion 5/5    Right Knee Extension 5/5    Left Knee Flexion 5/5    Left Knee Extension 5/5    Right Ankle Dorsiflexion 5/5    Left Ankle Dorsiflexion 5/5          Rayetta Humphrey, PT CLT [phone] 10/02/2020, 8:12 AM  [place], [place], [address] Phone: [phone]   Fax:  [phone]

## 2020-10-03 ENCOUNTER — Ambulatory Visit (HOSPITAL_COMMUNITY): Payer: Medicare Other | Admitting: Physical Therapy

## 2020-10-07 ENCOUNTER — Ambulatory Visit (HOSPITAL_COMMUNITY): Payer: Medicare Other | Admitting: Physical Therapy

## 2020-10-09 ENCOUNTER — Ambulatory Visit (HOSPITAL_COMMUNITY): Payer: Medicare Other | Admitting: Physical Therapy

## 2020-10-15 ENCOUNTER — Encounter (HOSPITAL_COMMUNITY): Payer: Medicare Other | Admitting: Physical Therapy

## 2020-10-17 ENCOUNTER — Encounter (HOSPITAL_COMMUNITY): Payer: Medicare Other | Admitting: Physical Therapy

## 2020-10-18 DIAGNOSIS — Z6841 Body Mass Index (BMI) 40.0 and over, adult: Secondary | ICD-10-CM | POA: Diagnosis not present

## 2020-10-18 DIAGNOSIS — G894 Chronic pain syndrome: Secondary | ICD-10-CM | POA: Diagnosis not present

## 2020-10-22 ENCOUNTER — Encounter (HOSPITAL_COMMUNITY): Payer: Medicare Other

## 2020-10-25 ENCOUNTER — Encounter (HOSPITAL_COMMUNITY): Payer: Medicare Other | Admitting: Physical Therapy

## 2020-10-29 ENCOUNTER — Encounter (HOSPITAL_COMMUNITY): Payer: Medicare Other

## 2020-11-21 ENCOUNTER — Telehealth: Payer: Self-pay | Admitting: Internal Medicine

## 2020-11-21 NOTE — Telephone Encounter (Signed)
I called [employer] and spoke with the asset recovery dept and they stated that they needed the last OV faxed to [phone].  This has been done and pt is aware.

## 2020-12-02 ENCOUNTER — Encounter (INDEPENDENT_AMBULATORY_CARE_PROVIDER_SITE_OTHER): Payer: Medicare Other | Admitting: Ophthalmology

## 2020-12-03 ENCOUNTER — Encounter (INDEPENDENT_AMBULATORY_CARE_PROVIDER_SITE_OTHER): Payer: Medicare Other | Admitting: Ophthalmology

## 2020-12-16 ENCOUNTER — Other Ambulatory Visit: Payer: Self-pay

## 2020-12-16 ENCOUNTER — Encounter (INDEPENDENT_AMBULATORY_CARE_PROVIDER_SITE_OTHER): Payer: Self-pay | Admitting: Ophthalmology

## 2020-12-16 ENCOUNTER — Ambulatory Visit (INDEPENDENT_AMBULATORY_CARE_PROVIDER_SITE_OTHER): Payer: Medicare Other | Admitting: Ophthalmology

## 2020-12-16 DIAGNOSIS — E113492 Type 2 diabetes mellitus with severe nonproliferative diabetic retinopathy without macular edema, left eye: Secondary | ICD-10-CM

## 2020-12-16 DIAGNOSIS — E113491 Type 2 diabetes mellitus with severe nonproliferative diabetic retinopathy without macular edema, right eye: Secondary | ICD-10-CM | POA: Diagnosis not present

## 2020-12-16 DIAGNOSIS — H43822 Vitreomacular adhesion, left eye: Secondary | ICD-10-CM | POA: Diagnosis not present

## 2020-12-16 NOTE — Assessment & Plan Note (Signed)
No diabetic maculopathy OU

## 2020-12-16 NOTE — Progress Notes (Signed)
12/16/2020     CHIEF COMPLAINT Patient presents for Retina Follow Up (6 Month NPDR f\u. OCT/Pt has noticed a slight decrease in vision. Pt denies new FOL and floaters./BGL: 121/A1C: 6.7)   HISTORY OF PRESENT ILLNESS: Colton Sam. is a 70 y.o. male who presents to the clinic today for:   HPI    Retina Follow Up    Patient presents with  Diabetic Retinopathy.  In right eye.  Severity is severe.  Duration of 6 months.  Since onset it is stable.  I, the attending physician,  performed the HPI with the patient and updated documentation appropriately. Additional comments: 6 Month NPDR f\u. OCT Pt has noticed a slight decrease in vision. Pt denies new FOL and floaters. BGL: 121 A1C: 6.7       Last edited by Tilda Franco on 12/16/2020  8:28 AM. (History)      Referring physician: Sharilyn Sites, MD [address] [place],  [place] [address]  HISTORICAL INFORMATION:   Selected notes from the MEDICAL RECORD NUMBER       CURRENT MEDICATIONS: Current Outpatient Medications (Ophthalmic Drugs)  Medication Sig  . latanoprost (XALATAN) 0.005 % ophthalmic solution Place 1 drop into both eyes at bedtime.     No current facility-administered medications for this visit. (Ophthalmic Drugs)   Current Outpatient Medications (Other)  Medication Sig  . acetaminophen (TYLENOL) 500 MG tablet Take 500 mg by mouth every 6 (six) hours as needed for mild pain or moderate pain (In addition to Hydrocodone/Apap as needed for pain).  (Patient not taking: Reported on 09/02/2020)  . amLODipine (NORVASC) 10 MG tablet Take 1 tablet by mouth once daily (Patient taking differently: Take 10 mg by mouth daily. )  . amphetamine-dextroamphetamine (ADDERALL) 10 MG tablet 1 or 2 tabs daily if needed  . aspirin 325 MG EC tablet Take 162.5 mg by mouth daily.  . cloNIDine (CATAPRES) 0.2 MG tablet Take 1 tablet (0.2 mg total) by mouth 3 (three) times daily.  . cromolyn (NASALCROM) 5.2 MG/ACT nasal spray Place  1 spray into both nostrils 3 (three) times daily as needed for allergies.   . famotidine (PEPCID) 20 MG tablet Take 20 mg by mouth 2 (two) times daily.   . furosemide (LASIX) 40 MG tablet Take 1 tablet (40 mg total) by mouth daily.  Marland Kitchen glipiZIDE (GLUCOTROL) 10 MG tablet Take 10 mg by mouth 2 (two) times daily.   Marland Kitchen HYDROcodone-acetaminophen (NORCO) 10-325 MG tablet Take 1 tablet by mouth 3 (three) times daily as needed for moderate pain.   Marland Kitchen insulin detemir (LEVEMIR) 100 UNIT/ML injection Inject 20-40 Units into the skin at bedtime as needed (for high blood sugar levels over 120).   Javier Docker Oil 500 MG CAPS Take 500 mg by mouth daily.   Marland Kitchen losartan (COZAAR) 100 MG tablet Take 1 tablet (100 mg total) by mouth daily.  . meclizine (ANTIVERT) 25 MG tablet Take 25 mg by mouth 3 (three) times daily as needed for dizziness or nausea (when taking hydrocodone/apap).   . meloxicam (MOBIC) 7.5 MG tablet Take 7.5 mg by mouth 2 (two) times daily.  . metFORMIN (GLUCOPHAGE-XR) 500 MG 24 hr tablet Take 1,000 mg by mouth 2 (two) times daily.  . metoprolol succinate (TOPROL-XL) 50 MG 24 hr tablet Take 1 tablet (50 mg total) by mouth daily.  . metroNIDAZOLE (METROGEL) 1 % gel Apply topically daily.  . mupirocin ointment (BACTROBAN) 2 % 1 application 2 (two) times daily.  Marland Kitchen  OXYGEN Inhale 2 L/hr into the lungs.  . Pediatric Multiple Vit-Vit C (VITAMIN DAILY PO) Take 2,000 Units by mouth daily.   . psyllium (METAMUCIL) 58.6 % powder Take 1 packet by mouth 2 (two) times daily.   . tamsulosin (FLOMAX) 0.4 MG CAPS capsule Take 0.4 mg by mouth.  . trospium (SANCTURA) 20 MG tablet Take 1 tablet (20 mg total) by mouth 2 (two) times daily.  . Vitamin D, Ergocalciferol, (DRISDOL) 1.25 MG (50000 UNIT) CAPS capsule Take by mouth.   No current facility-administered medications for this visit. (Other)      REVIEW OF SYSTEMS: ROS    Positive for: Endocrine   Last edited by Tilda Franco on 12/16/2020  8:28 AM. (History)        ALLERGIES Allergies  Allergen Reactions  . Covid-19 Mrna Vaccine [employer]) [phone] Mrna Vacc (Moderna)] Other (See Comments)    Joint pain and fatigue  . Sulfa Antibiotics Swelling    eyelids    PAST MEDICAL HISTORY Past Medical History:  Diagnosis Date  . Allergic rhinitis   . Aneurysm artery, neck (HCC)    [name] pulmonologist  . Degenerative disk disease   . Essential hypertension   . GERD (gastroesophageal reflux disease)   . H/O cardiovascular stress test 02/02/11   EF 59% - Normal stress test  . Hearing deficit, right   . Knee pain, bilateral   . Lower back pain   . Morbid obesity ([place])   . OSA (obstructive sleep apnea)    On CPAP  . Thyroid nodule   . Type 2 diabetes mellitus ([place])    Past Surgical History:  Procedure Laterality Date  . APPENDECTOMY    . CARDIAC CATHETERIZATION    . Cataract surgery Bilateral   . COLONOSCOPY WITH PROPOFOL N/A 08/01/2019   Procedure: COLONOSCOPY WITH PROPOFOL;  Surgeon: Aviva Signs, MD;  Location: [facility];  Service: General;  Laterality: N/A;  . KNEE ARTHROSCOPY Bilateral   . NASAL SEPTOPLASTY W/ TURBINOPLASTY    . RIGHT/LEFT HEART CATH AND CORONARY ANGIOGRAPHY N/A 06/23/2018   Procedure: RIGHT/LEFT HEART CATH AND CORONARY ANGIOGRAPHY;  Surgeon: Larey Dresser, MD;  Location: [facility];  Service: Cardiovascular;  Laterality: N/A;    FAMILY HISTORY Family History  Problem Relation Age of Onset  . Lung disease Father   . Stroke Father 68  . Diabetes Father   . Diabetes Maternal Grandfather   . Heart disease Paternal Grandmother   . Stroke Paternal Grandfather 37  . Diabetes Paternal Grandfather   . Diabetes Mother   . Cancer Mother   . Stroke Mother 28  . Diabetes Sister   . Diabetes Brother 11    SOCIAL HISTORY Social History   Tobacco Use  . Smoking status: Former Smoker    Quit date: 1971    Years since quitting: 51.1  . Smokeless tobacco: Never Used  . Tobacco comment: smoked  when he was a teenager  Vaping Use  . Vaping Use: Never used  Substance Use Topics  . Alcohol use: No  . Drug use: No         OPHTHALMIC EXAM:  Base Eye Exam    Visual Acuity (Snellen - Linear)      Right Left   Dist [place] 20/25 -1 20/25       Tonometry (Tonopen, 8:32 AM)      Right Left   Pressure 10 13       Pupils  Pupils Dark Light Shape React APD   Right PERRL 5 4 Round Sluggish None   Left PERRL 5 4 Round Sluggish None       Visual Fields (Counting fingers)      Left Right    Full Full       Neuro/Psych    Oriented x3: Yes   Mood/Affect: Normal       Dilation    Both eyes: 1.0% Mydriacyl, 2.5% Phenylephrine @ 8:32 AM        Slit Lamp and Fundus Exam    External Exam      Right Left   External Normal Normal       Slit Lamp Exam      Right Left   Lids/Lashes Normal Normal   Conjunctiva/Sclera White and quiet White and quiet   Cornea Clear Clear   Anterior Chamber Deep and quiet Deep and quiet   Iris Round and reactive Round and reactive   Lens Posterior chamber intraocular lens Posterior chamber intraocular lens   Anterior Vitreous Normal Normal       Fundus Exam      Right Left   Posterior Vitreous Normal Normal   Disc Normal Normal   C/D Ratio 0.7 0.7   Macula Microaneurysms, no clinically significant macular edema, no exudates, no macular thickening Microaneurysms, no clinically significant macular edema, no exudates, no macular thickening   Vessels NPDR-Severe NPDR-Severe   Periphery Normal Normal          IMAGING AND PROCEDURES  Imaging and Procedures for 12/16/20  OCT, Retina - OU - Both Eyes       Right Eye Quality was good. Scan locations included subfoveal. Central Foveal Thickness: 253. Progression has been stable.   Left Eye Quality was good. Central Foveal Thickness: 251. Progression has been stable. Findings include vitreomacular adhesion .   Notes No foveal traction.  No active maculopathy OU.                 ASSESSMENT/PLAN:  Severe nonproliferative diabetic retinopathy of left eye (HCC) Stable no signs of progression  Severe nonproliferative diabetic retinopathy of right eye (HCC) No diabetic maculopathy OU  Vitreomacular adhesion of left eye Incidentally noted, no foveal traction, observe      ICD-10-CM   1. Severe nonproliferative diabetic retinopathy of left eye associated with type 2 diabetes mellitus, macular edema presence unspecified (HCC)  Z61.0960 OCT, Retina - OU - Both Eyes  2. Severe nonproliferative diabetic retinopathy of right eye without macular edema associated with type 2 diabetes mellitus (HCC)  E11.3491 OCT, Retina - OU - Both Eyes  3. Vitreomacular adhesion of left eye  H43.822     1.  Severe nonproliferative diabetic retinopathy with no significant progression.  No active maculopathy.  2.  3.  Ophthalmic Meds Ordered this visit:  No orders of the defined types were placed in this encounter.      Return in about 6 months (around 06/15/2021) for DILATE OU, OCT, COLOR FP.  There are no Patient Instructions on file for this visit.   Explained the diagnoses, plan, and follow up with the patient and they expressed understanding.  Patient expressed understanding of the importance of proper follow up care.   Clent Demark Jaleen Finch M.D. Diseases & Surgery of the Retina and Vitreous [facility] [place] 12/16/20     Abbreviations: M myopia (nearsighted); A astigmatism; H hyperopia (farsighted); P presbyopia; Mrx spectacle prescription;  CTL contact lenses; OD right eye;  OS left eye; OU both eyes  XT exotropia; ET esotropia; PEK punctate epithelial keratitis; PEE punctate epithelial erosions; DES dry eye syndrome; MGD meibomian gland dysfunction; ATs artificial tears; PFAT's preservative free artificial tears; [place] nuclear sclerotic cataract; PSC posterior subcapsular cataract; ERM epi-retinal membrane; PVD posterior vitreous detachment; RD retinal  detachment; DM diabetes mellitus; DR diabetic retinopathy; NPDR non-proliferative diabetic retinopathy; PDR proliferative diabetic retinopathy; CSME clinically significant macular edema; DME diabetic macular edema; dbh dot blot hemorrhages; CWS cotton wool spot; POAG primary open angle glaucoma; C/D cup-to-disc ratio; HVF humphrey visual field; GVF goldmann visual field; OCT optical coherence tomography; IOP intraocular pressure; BRVO Branch retinal vein occlusion; CRVO central retinal vein occlusion; CRAO central retinal artery occlusion; BRAO branch retinal artery occlusion; RT retinal tear; SB scleral buckle; PPV pars plana vitrectomy; VH Vitreous hemorrhage; PRP panretinal laser photocoagulation; IVK intravitreal kenalog; VMT vitreomacular traction; MH Macular hole;  NVD neovascularization of the disc; NVE neovascularization elsewhere; AREDS age related eye disease study; ARMD age related macular degeneration; POAG primary open angle glaucoma; EBMD epithelial/anterior basement membrane dystrophy; ACIOL anterior chamber intraocular lens; IOL intraocular lens; PCIOL posterior chamber intraocular lens; Phaco/IOL phacoemulsification with intraocular lens placement; [place] photorefractive keratectomy; LASIK laser assisted in situ keratomileusis; HTN hypertension; DM diabetes mellitus; COPD chronic obstructive pulmonary disease

## 2020-12-16 NOTE — Assessment & Plan Note (Signed)
Stable no signs of progression

## 2020-12-16 NOTE — Assessment & Plan Note (Signed)
Incidentally noted, no foveal traction, observe

## 2020-12-19 DIAGNOSIS — G8929 Other chronic pain: Secondary | ICD-10-CM | POA: Diagnosis not present

## 2020-12-19 DIAGNOSIS — E109 Type 1 diabetes mellitus without complications: Secondary | ICD-10-CM | POA: Diagnosis not present

## 2020-12-19 DIAGNOSIS — I700 Atherosclerosis of aorta: Secondary | ICD-10-CM | POA: Diagnosis not present

## 2020-12-19 DIAGNOSIS — I10 Essential (primary) hypertension: Secondary | ICD-10-CM | POA: Diagnosis not present

## 2020-12-19 DIAGNOSIS — Z6841 Body Mass Index (BMI) 40.0 and over, adult: Secondary | ICD-10-CM | POA: Diagnosis not present

## 2020-12-19 DIAGNOSIS — E7849 Other hyperlipidemia: Secondary | ICD-10-CM | POA: Diagnosis not present

## 2021-01-20 DIAGNOSIS — I700 Atherosclerosis of aorta: Secondary | ICD-10-CM | POA: Diagnosis not present

## 2021-01-20 DIAGNOSIS — G894 Chronic pain syndrome: Secondary | ICD-10-CM | POA: Diagnosis not present

## 2021-01-20 DIAGNOSIS — Z6841 Body Mass Index (BMI) 40.0 and over, adult: Secondary | ICD-10-CM | POA: Diagnosis not present

## 2021-02-03 NOTE — Progress Notes (Signed)
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 Download-compliance 100%, AHI 1.2/ hr Body weight today- 301 lbs Covid vax- 2 Moderna O2 qualifying Walk Test 08/07/20-- desat to 87% on rrom air, 94% on 3L Flu Vax-today -----OSA, mask fits tight, and has marks on his head from it, sleeps well at night, denies being tired Activity limited by back and hip pain. Pending Ortho visit for injection. Easy DOE- gets very little exercise. Asks ok to self-pay for POC for more mobility, but wants to keep his current portable tank O2 as well.  Discussed latest CT report.  HRCT chest 04/08/20-  IMPRESSION: 1. Irregular, bandlike scarring and volume loss of the right lung, primarily involving the right lower lobe. Findings are not significantly changed compared to prior examinations and most consistent with post infectious or inflammatory scarring and volume loss. There are no specific findings to suggest fibrotic interstitial lung disease. 2. No new pulmonary nodule or other findings to explain queried abnormality of the left lung base on prior chest radiograph. 3. There  are numerous prominent, hyperdense, although not pathologically enlarged mediastinal and hilar lymph nodes, unchanged compared to prior examination and most consistent with nodal involvement of sarcoidosis, although occupational inhalational lung disease may also have this appearance. 4. Coronary artery disease. Aortic Atherosclerosis (ICD10-I70.0).  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 Download-compliance 100%, AHI 0.9/ hr Body weight today-300 lbs Covid vax-3 Moderna Flu vax-had CPAP is comfortable but he is getting a maintenance message in the mornings. Machine may be 70 yo. He notices gradually more DOE over past year. Harder to get around due to bilateral hip pain. Little cough or wheezing, O2 concentrator at home is 4L due to long hose. Portable on 2L.  ROS-see HPI   += positive Constitutional:    weight loss, night sweats, fevers, chills, fatigue, lassitude. HEENT:    headaches, difficulty swallowing, tooth/dental problems, sore throat,       sneezing, itching, ear ache, nasal congestion, post nasal drip, snoring CV:    chest pain, orthopnea, PND, swelling in lower extremities, anasarca,                                  dizziness, palpitations Resp:  + shortness of breath with exertion or at rest.                productive cough,   non-productive cough, coughing up of blood.  change in color of mucus.  wheezing.   Skin:    rash or lesions. GI:  No-   heartburn, indigestion, abdominal pain, nausea, vomiting, diarrhea,                 change in bowel habits, loss of appetite GU: dysuria, change in color of urine, no urgency or  frequency.   flank pain. MS:   +joint pain, stiffness, decreased range of motion, back pain. Neuro-     nothing unusual Psych:  change in mood or affect.  depression or anxiety.   memory loss.  OBJ- Physical Exam     His O2 2L  O2 sat 98% General- Alert, Oriented, Affect-appropriate, Distress- none acute Skin- rash-none, lesions- none, excoriation- none Lymphadenopathy- none Head- atraumatic            Eyes- Gross vision intact, PERRLA, conjunctivae and secretions clear            Ears- Hearing, canals-normal            Nose- Clear, no-Septal dev, mucus, polyps, erosion, perforation             Throat- Mallampati III , mucosa clear , drainage- none, tonsils- atrophic Neck- flexible , trachea midline, no stridor , thyroid nl, carotid no bruit Chest - symmetrical excursion , unlabored           Heart/CV- RRR , no murmur , no gallop  , no rub, nl s1 s2                           - JVD- none , edema- none, stasis changes- none, varices- none           Lung- clear to P&A, wheeze- none, cough- none , dullness-none, rub- none           Chest wall-  Abd-  Br/ Gen/ Rectal- Not done, not indicated Extrem- cyanosis- none, clubbing, none, atrophy- none, strength- nl Neuro- grossly intact to observation

## 2021-02-04 ENCOUNTER — Other Ambulatory Visit: Payer: Self-pay

## 2021-02-04 ENCOUNTER — Ambulatory Visit (INDEPENDENT_AMBULATORY_CARE_PROVIDER_SITE_OTHER): Payer: Medicare Other | Admitting: Internal Medicine

## 2021-02-04 ENCOUNTER — Encounter: Payer: Self-pay | Admitting: Internal Medicine

## 2021-02-04 VITALS — BP 124/68 | HR 86 | Temp 97.3°F | Ht 67.0 in | Wt 300.4 lb

## 2021-02-04 DIAGNOSIS — G4733 Obstructive sleep apnea (adult) (pediatric): Secondary | ICD-10-CM

## 2021-02-04 DIAGNOSIS — E662 Morbid (severe) obesity with alveolar hypoventilation: Secondary | ICD-10-CM

## 2021-02-04 DIAGNOSIS — J9611 Chronic respiratory failure with hypoxia: Secondary | ICD-10-CM | POA: Diagnosis not present

## 2021-02-04 LAB — CBC WITH DIFFERENTIAL/PLATELET
Basophils Absolute: 0.1 10*3/uL (ref 0.0–0.1)
Basophils Relative: 0.5 % (ref 0.0–3.0)
Eosinophils Absolute: 0.1 10*3/uL (ref 0.0–0.7)
Eosinophils Relative: 1.1 % (ref 0.0–5.0)
HCT: 41.8 % (ref 39.0–52.0)
Hemoglobin: 14.1 g/dL (ref 13.0–17.0)
Lymphocytes Relative: 20.3 % (ref 12.0–46.0)
Lymphs Abs: 2.2 10*3/uL (ref 0.7–4.0)
MCHC: 33.9 g/dL (ref 30.0–36.0)
MCV: 93.3 fl (ref 78.0–100.0)
Monocytes Absolute: 0.6 10*3/uL (ref 0.1–1.0)
Monocytes Relative: 5.9 % (ref 3.0–12.0)
Neutro Abs: 7.7 10*3/uL (ref 1.4–7.7)
Neutrophils Relative %: 72.2 % (ref 43.0–77.0)
Platelets: 317 10*3/uL (ref 150.0–400.0)
RBC: 4.48 Mil/uL (ref 4.22–5.81)
RDW: 14 % (ref 11.5–15.5)
WBC: 10.7 10*3/uL — ABNORMAL HIGH (ref 4.0–10.5)

## 2021-02-04 NOTE — Assessment & Plan Note (Addendum)
Continues to depend on oxygen 24/7 We will update HRCT and check ACE level, but most of his dyspnea is from old scarring/ works as a [name], Teacher, adult education Plan- continue O2, HRCT chest, ACE level, CBC.

## 2021-02-04 NOTE — Assessment & Plan Note (Signed)
Benefits from CPAP with O2 bleed-in. Plan- service machine, replace if eligible now, auto 15-20

## 2021-02-04 NOTE — Assessment & Plan Note (Signed)
Unable to exercise enough due to dyspnea and joint pain. Any improvement will have to come from diet.

## 2021-02-04 NOTE — Assessment & Plan Note (Signed)
Continues CPAP and O2

## 2021-02-04 NOTE — Patient Instructions (Signed)
Order- DME [employer]- please service CPAP machine- replace if eligible- auto 15-20, mask of choice, humidifier, supplies, AirView/ card             Continue O2   4L on concentrator with long hose, 2L portable  Order- lab- Angiotensin converting enzyme level, CBC w diff    Dx Sarcoid, chronic respiratory failure with hypoxia  Order- Schedule HRCT chest   Dx Chronic respiratory failure with hypoxia, hx Welder   Please call if we can help

## 2021-02-05 LAB — ANGIOTENSIN CONVERTING ENZYME: Angiotensin-Converting Enzyme: 14 U/L (ref 9–67)

## 2021-02-17 DIAGNOSIS — G894 Chronic pain syndrome: Secondary | ICD-10-CM | POA: Diagnosis not present

## 2021-02-17 DIAGNOSIS — Z6841 Body Mass Index (BMI) 40.0 and over, adult: Secondary | ICD-10-CM | POA: Diagnosis not present

## 2021-02-20 ENCOUNTER — Ambulatory Visit (INDEPENDENT_AMBULATORY_CARE_PROVIDER_SITE_OTHER): Payer: Medicare Other | Admitting: Urology

## 2021-02-20 ENCOUNTER — Encounter: Payer: Self-pay | Admitting: Urology

## 2021-02-20 ENCOUNTER — Other Ambulatory Visit (INDEPENDENT_AMBULATORY_CARE_PROVIDER_SITE_OTHER): Payer: Self-pay | Admitting: Ophthalmology

## 2021-02-20 ENCOUNTER — Other Ambulatory Visit: Payer: Self-pay

## 2021-02-20 VITALS — BP 153/84 | HR 105 | Temp 98.5°F | Ht 67.0 in | Wt 300.0 lb

## 2021-02-20 DIAGNOSIS — N5201 Erectile dysfunction due to arterial insufficiency: Secondary | ICD-10-CM

## 2021-02-20 DIAGNOSIS — N3941 Urge incontinence: Secondary | ICD-10-CM | POA: Diagnosis not present

## 2021-02-20 DIAGNOSIS — R350 Frequency of micturition: Secondary | ICD-10-CM

## 2021-02-20 LAB — URINALYSIS, ROUTINE W REFLEX MICROSCOPIC
Bilirubin, UA: NEGATIVE
Glucose, UA: NEGATIVE
Leukocytes,UA: NEGATIVE
Nitrite, UA: NEGATIVE
RBC, UA: NEGATIVE
Specific Gravity, UA: >1.03 — ABNORMAL HIGH (ref 1.005–1.030)
Urobilinogen, Ur: 0.2 mg/dL (ref 0.2–1.0)
pH, UA: 5.5 (ref 5.0–7.5)

## 2021-02-20 MED ORDER — GEMTESA 75 MG PO TABS: 75.0000 mg | ORAL_TABLET | Freq: Every day | ORAL | 0 refills | Status: DC

## 2021-02-20 MED ORDER — TROSPIUM CHLORIDE 20 MG PO TABS: 20.0000 mg | ORAL_TABLET | Freq: Two times a day (BID) | ORAL | 3 refills | Status: DC

## 2021-02-20 NOTE — Progress Notes (Signed)
Urological Symptom Review  Patient is experiencing the following symptoms: Frequent urination Leakage of urine (sometimes) Urine stream starts and stops   Review of Systems  Gastrointestinal (upper)  : Negative for upper GI symptoms  Gastrointestinal (lower) : Constipation  Constitutional : Fatigue  Skin: Negative for skin symptoms  Eyes: Negative for eye symptoms  Ear/Nose/Throat : Negative for Ear/Nose/Throat symptoms  Hematologic/Lymphatic: Negative for Hematologic/Lymphatic symptoms  Cardiovascular : Negative for cardiovascular symptoms  Respiratory : Negative for respiratory symptoms  Endocrine: Negative for endocrine symptoms  Musculoskeletal: Negative for musculoskeletal symptoms  Neurological: Negative for neurological symptoms  Psychologic: Negative for psychiatric symptoms

## 2021-02-20 NOTE — Progress Notes (Signed)
Subjective:  1. Urge incontinence   2. Urinary frequency   3. Erectile dysfunction due to arterial insufficiency      CC: Incontinence   Mr. Dunshee return today in f/u. He has a several year history of OAB wet with UUI and has completed PTNS induction and was on maintenance q4wks but has been off of that for over a year.  He remains on Trospium but he has had his diuretic increased and he will have increased am frequency as a result.   He has rare nocturia. He has had no hematuria or dysuria. He has frequency q2-3hrs. He has no hesitancy but a variable stream with some intermittency. His IPSS is 13.    He has ED and has some response to sildenafil.  He has not been active because of COVID concerns.      IPSS    Row Name 02/20/21 1300         International Prostate Symptom Score   How often have you had the sensation of not emptying your bladder? Less than 1 in 5     How often have you had to urinate less than every two hours? Less than half the time     How often have you found you stopped and started again several times when you urinated? Less than half the time     How often have you found it difficult to postpone urination? Less than half the time     How often have you had a weak urinary stream? Less than half the time     How often have you had to strain to start urination? Less than half the time     How many times did you typically get up at night to urinate? 2 Times     Total IPSS Score 13           Quality of Life due to urinary symptoms   If you were to spend the rest of your life with your urinary condition just the way it is now how would you feel about that? Mostly Satisfied             ROS:  ROS:  A complete review of systems was performed.  All systems are negative except for pertinent findings as noted.   Review of Systems    Allergies  Allergen Reactions  . Covid-19 Mrna Vaccine [employer]) [phone] Mrna Vacc (Moderna)] Other (See Comments)    Joint  pain and fatigue  . Sulfa Antibiotics Swelling    eyelids    Outpatient Encounter Medications as of 02/20/2021  Medication Sig  . acetaminophen (TYLENOL) 500 MG tablet Take 500 mg by mouth every 6 (six) hours as needed for mild pain or moderate pain (In addition to Hydrocodone/Apap as needed for pain).  Marland Kitchen amLODipine (NORVASC) 10 MG tablet Take 1 tablet by mouth once daily (Patient taking differently: Take 10 mg by mouth daily.)  . amphetamine-dextroamphetamine (ADDERALL) 10 MG tablet 1 or 2 tabs daily if needed  . aspirin 325 MG EC tablet Take 162.5 mg by mouth daily.  . cloNIDine (CATAPRES) 0.2 MG tablet Take 1 tablet (0.2 mg total) by mouth 3 (three) times daily.  . cromolyn (NASALCROM) 5.2 MG/ACT nasal spray Place 1 spray into both nostrils 3 (three) times daily as needed for allergies.   . famotidine (PEPCID) 20 MG tablet Take 20 mg by mouth 2 (two) times daily.   Marland Kitchen glipiZIDE (GLUCOTROL) 10 MG tablet Take 10 mg by mouth  2 (two) times daily.   Marland Kitchen HYDROcodone-acetaminophen (NORCO) 10-325 MG tablet Take 1 tablet by mouth 3 (three) times daily as needed for moderate pain.   Marland Kitchen insulin detemir (LEVEMIR) 100 UNIT/ML injection Inject 20-40 Units into the skin at bedtime as needed (for high blood sugar levels over 120).   Javier Docker Oil 500 MG CAPS Take 500 mg by mouth daily.   Marland Kitchen latanoprost (XALATAN) 0.005 % ophthalmic solution Place 1 drop into both eyes at bedtime.    Marland Kitchen losartan (COZAAR) 100 MG tablet Take 1 tablet (100 mg total) by mouth daily.  . meclizine (ANTIVERT) 25 MG tablet Take 25 mg by mouth 3 (three) times daily as needed for dizziness or nausea (when taking hydrocodone/apap).   . meloxicam (MOBIC) 7.5 MG tablet Take 7.5 mg by mouth 2 (two) times daily.  . metFORMIN (GLUCOPHAGE-XR) 500 MG 24 hr tablet Take 1,000 mg by mouth 2 (two) times daily.  . metoprolol succinate (TOPROL-XL) 50 MG 24 hr tablet Take 1 tablet (50 mg total) by mouth daily.  . metroNIDAZOLE (METROGEL) 1 % gel Apply  topically daily.  . mupirocin ointment (BACTROBAN) 2 % 1 application 2 (two) times daily.  . OXYGEN Inhale 2 L/hr into the lungs.  . Pediatric Multiple Vit-Vit C (VITAMIN DAILY PO) Take 2,000 Units by mouth daily.   . psyllium (METAMUCIL) 58.6 % powder Take 1 packet by mouth 2 (two) times daily.   . Vibegron (GEMTESA) 75 MG TABS Take 75 mg by mouth daily.  . Vitamin D, Ergocalciferol, (DRISDOL) 1.25 MG (50000 UNIT) CAPS capsule Take by mouth.  . [DISCONTINUED] tamsulosin (FLOMAX) 0.4 MG CAPS capsule Take 0.4 mg by mouth.  . [DISCONTINUED] trospium (SANCTURA) 20 MG tablet Take 1 tablet (20 mg total) by mouth 2 (two) times daily.  . furosemide (LASIX) 40 MG tablet Take 1 tablet (40 mg total) by mouth daily.  . trospium (SANCTURA) 20 MG tablet Take 1 tablet (20 mg total) by mouth 2 (two) times daily.   No facility-administered encounter medications on file as of 02/20/2021.    Past Medical History:  Diagnosis Date  . Allergic rhinitis   . Aneurysm artery, neck (HCC)    [name] pulmonologist  . Degenerative disk disease   . Essential hypertension   . GERD (gastroesophageal reflux disease)   . H/O cardiovascular stress test 02/02/11   EF 59% - Normal stress test  . Hearing deficit, right   . Knee pain, bilateral   . Lower back pain   . Morbid obesity ([place])   . OSA (obstructive sleep apnea)    On CPAP  . Thyroid nodule   . Type 2 diabetes mellitus ([place])     Past Surgical History:  Procedure Laterality Date  . APPENDECTOMY    . CARDIAC CATHETERIZATION    . Cataract surgery Bilateral   . COLONOSCOPY WITH PROPOFOL N/A 08/01/2019   Procedure: COLONOSCOPY WITH PROPOFOL;  Surgeon: Aviva Signs, MD;  [facility];  Service: General;  Laterality: N/A;  . KNEE ARTHROSCOPY Bilateral   . NASAL SEPTOPLASTY W/ TURBINOPLASTY    . RIGHT/LEFT HEART CATH AND CORONARY ANGIOGRAPHY N/A 06/23/2018   Procedure: RIGHT/LEFT HEART CATH AND CORONARY ANGIOGRAPHY;  Surgeon: Larey Dresser, MD;   Location: [facility];  Service: Cardiovascular;  Laterality: N/A;    Social History   Socioeconomic History  . Marital status: Widowed    Spouse name: Not on file  . Number of children: Not on file  .  Years of education: Not on file  . Highest education level: Not on file  Occupational History  . Occupation: former [name], tow Futures trader  Tobacco Use  . Smoking status: Former Smoker    Quit date: 1971    Years since quitting: 51.2  . Smokeless tobacco: Never Used  . Tobacco comment: smoked when he was a teenager  Vaping Use  . Vaping Use: Never used  Substance and Sexual Activity  . Alcohol use: No  . Drug use: No  . Sexual activity: Not on file  Other Topics Concern  . Not on file  Social History Narrative  . Not on file   Social Determinants of Health   Financial Resource Strain: Not on file  Food Insecurity: Not on file  Transportation Needs: Not on file  Physical Activity: Not on file  Stress: Not on file  Social Connections: Not on file  Intimate Partner Violence: Not on file    Family History  Problem Relation Age of Onset  . Lung disease Father   . Stroke Father 64  . Diabetes Father   . Diabetes Maternal Grandfather   . Heart disease Paternal Grandmother   . Stroke Paternal Grandfather 20  . Diabetes Paternal Grandfather   . Diabetes Mother   . Cancer Mother   . Stroke Mother 9  . Diabetes Sister   . Diabetes Brother 36       Objective: Vitals:   02/20/21 1332  BP: (!) 153/84  Pulse: (!) 105  Temp: 98.5 F (36.9 C)     Physical Exam  Lab Results:  No results found for this or any previous visit (from the past 24 hour(s)).  BMET No results for input(s): NA, K, CL, CO2, GLUCOSE, BUN, CREATININE, CALCIUM in the last 72 hours. PSA No results found for: PSA No results found for: TESTOSTERONE  UA is clear.   Studies/Results: No results found. I have reviewed his records and labs in Epic.   Assessment & Plan: Urge  incontinence remains which is partially controlled with Trospium.  I  Am going to give him a trial of Gemtesa and he will hold the trospium will on it.   He will let me know if he needs a script.    ED.   He has not been active but reports a response to sildenafil.    Meds ordered this encounter  Medications  . trospium (SANCTURA) 20 MG tablet    Sig: Take 1 tablet (20 mg total) by mouth 2 (two) times daily.    Dispense:  180 tablet    Refill:  3  . Vibegron (GEMTESA) 75 MG TABS    Sig: Take 75 mg by mouth daily.    Dispense:  28 tablet    Refill:  0     Orders Placed This Encounter  Procedures  . Urinalysis, Routine w reflex microscopic      Return in about 3 months (around 05/22/2021).   CC: Sharilyn Sites, MD      Irine Seal 02/20/2021

## 2021-02-21 ENCOUNTER — Ambulatory Visit: Payer: BLUE CROSS/BLUE SHIELD | Admitting: Urology

## 2021-03-02 ENCOUNTER — Encounter: Payer: Self-pay | Admitting: Internal Medicine

## 2021-03-03 ENCOUNTER — Ambulatory Visit (HOSPITAL_COMMUNITY): Payer: Medicare Other

## 2021-03-06 ENCOUNTER — Telehealth: Payer: Self-pay

## 2021-03-06 NOTE — Telephone Encounter (Signed)
Pt called saying the Gemtesa samples he was given did not work. He wants to go back to Trospium. I explained to stop taking the [facility] and he has a prescription at [facility] Dr. Jeffie Pollock had sent in. Pt expressed understanding.

## 2021-03-24 ENCOUNTER — Other Ambulatory Visit (INDEPENDENT_AMBULATORY_CARE_PROVIDER_SITE_OTHER): Payer: Self-pay | Admitting: Ophthalmology

## 2021-03-24 DIAGNOSIS — Z6841 Body Mass Index (BMI) 40.0 and over, adult: Secondary | ICD-10-CM | POA: Diagnosis not present

## 2021-03-24 DIAGNOSIS — I700 Atherosclerosis of aorta: Secondary | ICD-10-CM | POA: Diagnosis not present

## 2021-03-24 DIAGNOSIS — G894 Chronic pain syndrome: Secondary | ICD-10-CM | POA: Diagnosis not present

## 2021-03-24 DIAGNOSIS — Z1331 Encounter for screening for depression: Secondary | ICD-10-CM | POA: Diagnosis not present

## 2021-04-16 ENCOUNTER — Ambulatory Visit (INDEPENDENT_AMBULATORY_CARE_PROVIDER_SITE_OTHER): Payer: Medicare Other | Admitting: Ophthalmology

## 2021-04-16 ENCOUNTER — Encounter (INDEPENDENT_AMBULATORY_CARE_PROVIDER_SITE_OTHER): Payer: Self-pay | Admitting: Ophthalmology

## 2021-04-16 ENCOUNTER — Other Ambulatory Visit: Payer: Self-pay

## 2021-04-16 DIAGNOSIS — E113492 Type 2 diabetes mellitus with severe nonproliferative diabetic retinopathy without macular edema, left eye: Secondary | ICD-10-CM | POA: Diagnosis not present

## 2021-04-16 DIAGNOSIS — H401131 Primary open-angle glaucoma, bilateral, mild stage: Secondary | ICD-10-CM

## 2021-04-16 DIAGNOSIS — E113491 Type 2 diabetes mellitus with severe nonproliferative diabetic retinopathy without macular edema, right eye: Secondary | ICD-10-CM

## 2021-04-16 DIAGNOSIS — H43822 Vitreomacular adhesion, left eye: Secondary | ICD-10-CM | POA: Diagnosis not present

## 2021-04-16 NOTE — Assessment & Plan Note (Signed)
Minor no physiologic impact

## 2021-04-16 NOTE — Assessment & Plan Note (Signed)
Patient to continue on single drop therapy to prevent OAG progression

## 2021-04-16 NOTE — Progress Notes (Signed)
04/16/2021     CHIEF COMPLAINT Patient presents for Retina Follow Up (4 Mo F/U OU//Pt denies noticeable changes to [place] OU since last visit. Pt denies ocular pain, flashes of light, or floaters OU. /A1c: 7.3, 09/2020/LBS: 100 this AM/)   HISTORY OF PRESENT ILLNESS: Colton Jackson. is a 70 y.o. male who presents to the clinic today for:   HPI    Retina Follow Up    Diagnosis: Diabetic Retinopathy   Laterality: both eyes   Onset: 4 months ago   Severity: mild   Duration: 4 months   Course: stable   Comments: 4 Mo F/U OU  Pt denies noticeable changes to [place] OU since last visit. Pt denies ocular pain, flashes of light, or floaters OU.  A1c: 7.3, 09/2020 LBS: 100 this AM        Last edited by Rockie Neighbours, Hall on 04/16/2021 10:26 AM. (History)      Referring physician: Sharilyn Sites, MD [address] [place],  [place] [address]  HISTORICAL INFORMATION:   Selected notes from the MEDICAL RECORD NUMBER       CURRENT MEDICATIONS: Current Outpatient Medications (Ophthalmic Drugs)  Medication Sig  . latanoprost (XALATAN) 0.005 % ophthalmic solution INSTILL 1 DROP IN BOTH EYES EVERY DAY AT BEDTIME- NEEDS TO FOLLOWUP WITH GLAUCOMA DOCTOR FOR REFILLS   No current facility-administered medications for this visit. (Ophthalmic Drugs)   Current Outpatient Medications (Other)  Medication Sig  . acetaminophen (TYLENOL) 500 MG tablet Take 500 mg by mouth every 6 (six) hours as needed for mild pain or moderate pain (In addition to Hydrocodone/Apap as needed for pain).  Marland Kitchen amLODipine (NORVASC) 10 MG tablet Take 1 tablet by mouth once daily (Patient taking differently: Take 10 mg by mouth daily.)  . amphetamine-dextroamphetamine (ADDERALL) 10 MG tablet 1 or 2 tabs daily if needed  . aspirin 325 MG EC tablet Take 162.5 mg by mouth daily.  . cloNIDine (CATAPRES) 0.2 MG tablet Take 1 tablet (0.2 mg total) by mouth 3 (three) times daily.  . cromolyn (NASALCROM) 5.2 MG/ACT nasal spray Place  1 spray into both nostrils 3 (three) times daily as needed for allergies.   . famotidine (PEPCID) 20 MG tablet Take 20 mg by mouth 2 (two) times daily.   . furosemide (LASIX) 40 MG tablet Take 1 tablet (40 mg total) by mouth daily.  Marland Kitchen glipiZIDE (GLUCOTROL) 10 MG tablet Take 10 mg by mouth 2 (two) times daily.   Marland Kitchen HYDROcodone-acetaminophen (NORCO) 10-325 MG tablet Take 1 tablet by mouth 3 (three) times daily as needed for moderate pain.   Marland Kitchen insulin detemir (LEVEMIR) 100 UNIT/ML injection Inject 20-40 Units into the skin at bedtime as needed (for high blood sugar levels over 120).   Javier Docker Oil 500 MG CAPS Take 500 mg by mouth daily.   Marland Kitchen losartan (COZAAR) 100 MG tablet Take 1 tablet (100 mg total) by mouth daily.  . meclizine (ANTIVERT) 25 MG tablet Take 25 mg by mouth 3 (three) times daily as needed for dizziness or nausea (when taking hydrocodone/apap).   . meloxicam (MOBIC) 7.5 MG tablet Take 7.5 mg by mouth 2 (two) times daily.  . metFORMIN (GLUCOPHAGE-XR) 500 MG 24 hr tablet Take 1,000 mg by mouth 2 (two) times daily.  . metoprolol succinate (TOPROL-XL) 50 MG 24 hr tablet Take 1 tablet (50 mg total) by mouth daily.  . metroNIDAZOLE (METROGEL) 1 % gel Apply topically daily.  . mupirocin ointment (BACTROBAN) 2 % 1 application  2 (two) times daily.  . OXYGEN Inhale 2 L/hr into the lungs.  . Pediatric Multiple Vit-Vit C (VITAMIN DAILY PO) Take 2,000 Units by mouth daily.   . psyllium (METAMUCIL) 58.6 % powder Take 1 packet by mouth 2 (two) times daily.   . trospium (SANCTURA) 20 MG tablet Take 1 tablet (20 mg total) by mouth 2 (two) times daily.  . Vibegron (GEMTESA) 75 MG TABS Take 75 mg by mouth daily.  . Vitamin D, Ergocalciferol, (DRISDOL) 1.25 MG (50000 UNIT) CAPS capsule Take by mouth.   No current facility-administered medications for this visit. (Other)      REVIEW OF SYSTEMS:    ALLERGIES Allergies  Allergen Reactions  . Covid-19 Mrna Vaccine [employer]) [phone] Mrna Vacc  (Moderna)] Other (See Comments)    Joint pain and fatigue  . Sulfa Antibiotics Swelling    eyelids    PAST MEDICAL HISTORY Past Medical History:  Diagnosis Date  . Allergic rhinitis   . Aneurysm artery, neck (HCC)    [name] pulmonologist  . Degenerative disk disease   . Essential hypertension   . GERD (gastroesophageal reflux disease)   . H/O cardiovascular stress test 02/02/11   EF 59% - Normal stress test  . Hearing deficit, right   . Knee pain, bilateral   . Lower back pain   . Morbid obesity ([place])   . OSA (obstructive sleep apnea)    On CPAP  . Thyroid nodule   . Type 2 diabetes mellitus ([place])   . Type 2 diabetes mellitus without complication ([place]) 9/56/3875   Qualifier: Diagnosis of  By: Annamaria Boots MD, Kasandra Knudsen    Past Surgical History:  Procedure Laterality Date  . APPENDECTOMY    . CARDIAC CATHETERIZATION    . Cataract surgery Bilateral   . COLONOSCOPY WITH PROPOFOL N/A 08/01/2019   Procedure: COLONOSCOPY WITH PROPOFOL;  Surgeon: Aviva Signs, MD;  [facility];  Service: General;  Laterality: N/A;  . KNEE ARTHROSCOPY Bilateral   . NASAL SEPTOPLASTY W/ TURBINOPLASTY    . RIGHT/LEFT HEART CATH AND CORONARY ANGIOGRAPHY N/A 06/23/2018   Procedure: RIGHT/LEFT HEART CATH AND CORONARY ANGIOGRAPHY;  Surgeon: Larey Dresser, MD;  Location: [facility];  Service: Cardiovascular;  Laterality: N/A;    FAMILY HISTORY Family History  Problem Relation Age of Onset  . Lung disease Father   . Stroke Father 53  . Diabetes Father   . Diabetes Maternal Grandfather   . Heart disease Paternal Grandmother   . Stroke Paternal Grandfather 73  . Diabetes Paternal Grandfather   . Diabetes Mother   . Cancer Mother   . Stroke Mother 43  . Diabetes Sister   . Diabetes Brother 64    SOCIAL HISTORY Social History   Tobacco Use  . Smoking status: Former Smoker    Quit date: 1971    Years since quitting: 51.4  . Smokeless tobacco: Never Used  . Tobacco comment:  smoked when he was a teenager  Vaping Use  . Vaping Use: Never used  Substance Use Topics  . Alcohol use: No  . Drug use: No         OPHTHALMIC EXAM:  Base Eye Exam    Visual Acuity (ETDRS)      Right Left   Dist [place] 20/25 +2 20/25 +2       Tonometry (Tonopen, 10:30 AM)      Right Left   Pressure 17 18       Pupils  Pupils Dark Light Shape React APD   Right PERRL 5 4 Round Sluggish None   Left PERRL 5 4 Round Sluggish None       Visual Fields (Counting fingers)      Left Right    Full Full       Extraocular Movement      Right Left    Full Full       Neuro/Psych    Oriented x3: Yes   Mood/Affect: Normal       Dilation    Both eyes: 1.0% Mydriacyl, 2.5% Phenylephrine @ 10:30 AM        Slit Lamp and Fundus Exam    External Exam      Right Left   External Normal Normal       Slit Lamp Exam      Right Left   Lids/Lashes Normal Normal   Conjunctiva/Sclera White and quiet White and quiet   Cornea Clear Clear   Anterior Chamber Deep and quiet Deep and quiet   Iris Round and reactive Round and reactive   Lens Posterior chamber intraocular lens Posterior chamber intraocular lens   Anterior Vitreous Normal Normal       Fundus Exam      Right Left   Posterior Vitreous Normal Normal   Disc Normal Normal   C/D Ratio 0.7 0.7   Macula Microaneurysms, no clinically significant macular edema, no exudates, no macular thickening Microaneurysms, no clinically significant macular edema, no exudates, no macular thickening   Vessels NPDR-Severe NPDR-Severe   Periphery Normal Normal          IMAGING AND PROCEDURES  Imaging and Procedures for 04/16/21  OCT, Retina - OU - Both Eyes       Right Eye Quality was good. Scan locations included subfoveal. Central Foveal Thickness: 253. Progression has been stable.   Left Eye Quality was good. Central Foveal Thickness: 251. Progression has been stable. Findings include vitreomacular adhesion .    Notes No foveal traction.  No active maculopathy OU.                ASSESSMENT/PLAN:  Severe nonproliferative diabetic retinopathy of left eye (HCC) No active maculopathy  Severe nonproliferative diabetic retinopathy of right eye (HCC) No active maculopathy  Vitreomacular adhesion of left eye Minor no physiologic impact  Primary open angle glaucoma of both eyes, mild stage Patient to continue on single drop therapy to prevent OAG progression      ICD-10-CM   1. Severe nonproliferative diabetic retinopathy of left eye associated with type 2 diabetes mellitus, macular edema presence unspecified (HCC)  Z56.3875 OCT, Retina - OU - Both Eyes  2. Severe nonproliferative diabetic retinopathy of right eye without macular edema associated with type 2 diabetes mellitus (HCC)  E11.3491 OCT, Retina - OU - Both Eyes  3. Vitreomacular adhesion of left eye  H43.822   4. Primary open angle glaucoma of both eyes, mild stage  H40.1131     1.  We will continue to observe and monitor macula as well as peripheral retinopathy.  No progression in stages.  2.  Highly compliant with topical medications for glaucoma continue  3.  Ophthalmic Meds Ordered this visit:  No orders of the defined types were placed in this encounter.      Return in about 6 months (around 10/16/2021) for DILATE OU, COLOR FP, OCT.  There are no Patient Instructions on file for this visit.   Explained the diagnoses, plan, and follow  up with the patient and they expressed understanding.  Patient expressed understanding of the importance of proper follow up care.   Clent Demark Elon Lomeli M.D. Diseases & Surgery of the Retina and Vitreous Retina & Diabetic [place] 04/16/21     Abbreviations: M myopia (nearsighted); A astigmatism; H hyperopia (farsighted); P presbyopia; Mrx spectacle prescription;  CTL contact lenses; OD right eye; OS left eye; OU both eyes  XT exotropia; ET esotropia; PEK punctate epithelial  keratitis; PEE punctate epithelial erosions; DES dry eye syndrome; MGD meibomian gland dysfunction; ATs artificial tears; PFAT's preservative free artificial tears; [place] nuclear sclerotic cataract; PSC posterior subcapsular cataract; ERM epi-retinal membrane; PVD posterior vitreous detachment; RD retinal detachment; DM diabetes mellitus; DR diabetic retinopathy; NPDR non-proliferative diabetic retinopathy; PDR proliferative diabetic retinopathy; CSME clinically significant macular edema; DME diabetic macular edema; dbh dot blot hemorrhages; CWS cotton wool spot; POAG primary open angle glaucoma; C/D cup-to-disc ratio; HVF humphrey visual field; GVF goldmann visual field; OCT optical coherence tomography; IOP intraocular pressure; BRVO Branch retinal vein occlusion; CRVO central retinal vein occlusion; CRAO central retinal artery occlusion; BRAO branch retinal artery occlusion; RT retinal tear; SB scleral buckle; PPV pars plana vitrectomy; VH Vitreous hemorrhage; PRP panretinal laser photocoagulation; IVK intravitreal kenalog; VMT vitreomacular traction; MH Macular hole;  NVD neovascularization of the disc; NVE neovascularization elsewhere; AREDS age related eye disease study; ARMD age related macular degeneration; POAG primary open angle glaucoma; EBMD epithelial/anterior basement membrane dystrophy; ACIOL anterior chamber intraocular lens; IOL intraocular lens; PCIOL posterior chamber intraocular lens; Phaco/IOL phacoemulsification with intraocular lens placement; [place] photorefractive keratectomy; LASIK laser assisted in situ keratomileusis; HTN hypertension; DM diabetes mellitus; COPD chronic obstructive pulmonary disease

## 2021-04-16 NOTE — Assessment & Plan Note (Signed)
No active maculopathy

## 2021-04-16 NOTE — Assessment & Plan Note (Signed)
No active maculopathy 

## 2021-04-21 DIAGNOSIS — I700 Atherosclerosis of aorta: Secondary | ICD-10-CM | POA: Diagnosis not present

## 2021-04-21 DIAGNOSIS — I5030 Unspecified diastolic (congestive) heart failure: Secondary | ICD-10-CM | POA: Diagnosis not present

## 2021-04-21 DIAGNOSIS — Z6841 Body Mass Index (BMI) 40.0 and over, adult: Secondary | ICD-10-CM | POA: Diagnosis not present

## 2021-04-21 DIAGNOSIS — E109 Type 1 diabetes mellitus without complications: Secondary | ICD-10-CM | POA: Diagnosis not present

## 2021-04-21 DIAGNOSIS — I10 Essential (primary) hypertension: Secondary | ICD-10-CM | POA: Diagnosis not present

## 2021-05-08 DIAGNOSIS — Z23 Encounter for immunization: Secondary | ICD-10-CM | POA: Diagnosis not present

## 2021-05-16 DIAGNOSIS — G894 Chronic pain syndrome: Secondary | ICD-10-CM | POA: Diagnosis not present

## 2021-05-16 DIAGNOSIS — Z6841 Body Mass Index (BMI) 40.0 and over, adult: Secondary | ICD-10-CM | POA: Diagnosis not present

## 2021-05-22 ENCOUNTER — Ambulatory Visit: Payer: Medicare Other | Admitting: Urology

## 2021-06-16 ENCOUNTER — Encounter (INDEPENDENT_AMBULATORY_CARE_PROVIDER_SITE_OTHER): Payer: Medicare Other | Admitting: Ophthalmology

## 2021-06-16 DIAGNOSIS — G894 Chronic pain syndrome: Secondary | ICD-10-CM | POA: Diagnosis not present

## 2021-07-23 DIAGNOSIS — G894 Chronic pain syndrome: Secondary | ICD-10-CM | POA: Diagnosis not present

## 2021-07-23 DIAGNOSIS — Z6841 Body Mass Index (BMI) 40.0 and over, adult: Secondary | ICD-10-CM | POA: Diagnosis not present

## 2021-07-23 DIAGNOSIS — Z23 Encounter for immunization: Secondary | ICD-10-CM | POA: Diagnosis not present

## 2021-07-24 NOTE — Progress Notes (Signed)
HPI- M never smoker followed for Chronic Hypoxic Respiratory Failure, Allergic rhinitis, OSA, morbid obesity, obesity/ hypoventilation, complicated by back pain, DM, HBP, glaucoma, pulmonary hypertension( Cardiology) ACE level -08/05/11- WNL 24 NPSG 09/22/14- AHI 72.7/ hr, CPAP to 19, weight 332 lbs PFT 10/21/16-  severe obstruction and restriction with diffusion relatively high for alveolar volume Echocardiogram 10/12/16- BNP 10/09/17- 20.5   WNL O2 qualifying Walk Test 08/07/20-- desat to 87% on rrom air, 94% on 3L HRCT chest 04/08/20- post infectious or inflammatory scarring and volume loss. There are no specific findings to suggest fibrotic interstitial lung disease.  There are numerous prominent, hyperdense, although not pathologically enlarged mediastinal and hilar lymph nodes, unchanged compared to prior examination and most consistent with nodal involvement of sarcoidosis, although occupational inhalational lung disease may also have this appearance. 4. Coronary artery disease. Aortic Atherosclerosis (ICD10-I70.0). 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 Download-compliance 100%, AHI 0.9/ hr Body weight today-300 lbs Covid vax-3 Moderna Flu vax-had CPAP is comfortable but he is getting a maintenance message in the mornings. Machine may be 70 yo. He notices gradually more DOE over past year. Harder to get around due to bilateral hip pain. Little cough or wheezing, O2 concentrator at home is 4L due to long hose. Portable on 2L.  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 Download- compliance 100%, AHI 1.5/ hr Body weight today-300 lbs Covid vax- 3 Moderna ACE level 01/15/21- 14 (9-67) Arrival O2 sat 92% on O2 2L Flu vax today CPAP doing fine.  O2- uses 4L at house with long cord, 2L with portable tank. Feels more easily DOE, little cough or wheeze, no acute events. He had missed notice to update HRCT- we can reschedule.  ROS-see HPI   += positive Constitutional:    weight loss, night sweats, fevers, chills, fatigue, lassitude. HEENT:    headaches, difficulty swallowing, tooth/dental problems, sore throat,       sneezing, itching, ear ache, nasal congestion, post nasal drip, snoring CV:    chest pain, orthopnea, PND, swelling in lower extremities, anasarca,                                   dizziness, palpitations Resp:  + shortness of breath with exertion or at rest.                productive cough,   non-productive cough, coughing up of blood.              change in color of mucus.  wheezing.   Skin:    rash or lesions. GI:  No-   heartburn, indigestion, abdominal pain, nausea, vomiting, diarrhea,                 change in bowel habits, loss of appetite GU: dysuria, change in color of urine, no urgency or frequency.   flank  pain. MS:   +joint pain, stiffness, decreased range of motion, back pain. Neuro-     nothing unusual Psych:  change in mood or affect.  depression or anxiety.   memory loss.  OBJ- Physical Exam     His O2 2L  O2 sat 92%, + morbidly obese, + rolling walker,  General- Alert, Oriented, Affect-appropriate, Distress- none acute Skin- rash-none, lesions- none, excoriation- none Lymphadenopathy- none Head- atraumatic             Eyes- Gross vision intact, PERRLA, conjunctivae and secretions clear            Ears- Hearing, canals-normal            Nose- Clear, no-Septal dev, mucus, polyps, erosion, perforation             Throat- Mallampati III , mucosa clear , drainage- none, tonsils- atrophic Neck- flexible , trachea midline, no stridor , thyroid nl, carotid no bruit Chest - symmetrical excursion , unlabored           Heart/CV- RRR , no murmur , no gallop  , no rub, nl s1 s2                           - JVD- none , edema- none, stasis changes- none, varices- none           Lung- clear to P&A, wheeze- none, cough- none , dullness-none, rub- none           Chest wall-  Abd-  Br/ Gen/ Rectal- Not done, not indicated Extrem- cyanosis- none, clubbing, none, atrophy- none, strength- nl Neuro- grossly intact to observation

## 2021-07-25 ENCOUNTER — Other Ambulatory Visit: Payer: Self-pay

## 2021-07-25 ENCOUNTER — Encounter: Payer: Self-pay | Admitting: Internal Medicine

## 2021-07-25 ENCOUNTER — Ambulatory Visit (INDEPENDENT_AMBULATORY_CARE_PROVIDER_SITE_OTHER): Payer: Medicare Other | Admitting: Internal Medicine

## 2021-07-25 VITALS — BP 138/68 | HR 88 | Temp 98.1°F | Ht 68.0 in | Wt 300.0 lb

## 2021-07-25 DIAGNOSIS — J9611 Chronic respiratory failure with hypoxia: Secondary | ICD-10-CM | POA: Diagnosis not present

## 2021-07-25 DIAGNOSIS — Z23 Encounter for immunization: Secondary | ICD-10-CM

## 2021-07-25 DIAGNOSIS — G4733 Obstructive sleep apnea (adult) (pediatric): Secondary | ICD-10-CM

## 2021-07-25 NOTE — Assessment & Plan Note (Signed)
Good compliance and control. Sleeps better with CPAP Plan- continue CPAP 19/ O2 2-4L

## 2021-07-25 NOTE — Assessment & Plan Note (Addendum)
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 Plan- reschedule HRCT, Flu vax

## 2021-07-25 NOTE — Assessment & Plan Note (Signed)
Ongoing encouragement to lose weight

## 2021-07-25 NOTE — Patient Instructions (Addendum)
Order- reschedule CT chest High Resolution   dx Chronic Hypoxic Respiratory Failure, retired Therapist, sports- Flu vax- senior  Continue O2 4l at home, 2L portable  Please call if we can help

## 2021-07-31 ENCOUNTER — Ambulatory Visit: Payer: Medicare Other | Admitting: Urology

## 2021-08-07 ENCOUNTER — Ambulatory Visit: Payer: Medicare Other | Admitting: Internal Medicine

## 2021-08-22 DIAGNOSIS — I10 Essential (primary) hypertension: Secondary | ICD-10-CM | POA: Diagnosis not present

## 2021-08-22 DIAGNOSIS — E785 Hyperlipidemia, unspecified: Secondary | ICD-10-CM | POA: Diagnosis not present

## 2021-08-22 DIAGNOSIS — M1991 Primary osteoarthritis, unspecified site: Secondary | ICD-10-CM | POA: Diagnosis not present

## 2021-08-22 DIAGNOSIS — J449 Chronic obstructive pulmonary disease, unspecified: Secondary | ICD-10-CM | POA: Diagnosis not present

## 2021-08-22 DIAGNOSIS — Z6841 Body Mass Index (BMI) 40.0 and over, adult: Secondary | ICD-10-CM | POA: Diagnosis not present

## 2021-08-22 DIAGNOSIS — E109 Type 1 diabetes mellitus without complications: Secondary | ICD-10-CM | POA: Diagnosis not present

## 2021-08-22 DIAGNOSIS — I5030 Unspecified diastolic (congestive) heart failure: Secondary | ICD-10-CM | POA: Diagnosis not present

## 2021-08-22 DIAGNOSIS — G894 Chronic pain syndrome: Secondary | ICD-10-CM | POA: Diagnosis not present

## 2021-08-22 DIAGNOSIS — K5903 Drug induced constipation: Secondary | ICD-10-CM | POA: Diagnosis not present

## 2021-08-22 DIAGNOSIS — I700 Atherosclerosis of aorta: Secondary | ICD-10-CM | POA: Diagnosis not present

## 2021-08-22 IMAGING — CT CT CHEST HIGH RESOLUTION W/O CM
3 of 4 series · 14 of 36 positions shown, 15 images · non-contrast
Comparison: Chest radiographs, 03/26/2020, CT chest angiogram,
07/07/2019, CT chest, 07/28/2018, thyroid ultrasound, 07/27/2019

CLINICAL DATA: Interstitial lung disease, possible pulmonary nodule
identified by radiograph

EXAM:
CT CHEST WITHOUT CONTRAST
TECHNIQUE: Multidetector CT imaging of the chest was performed following the
standard protocol without intravenous contrast. High resolution
imaging of the lungs, as well as inspiratory and expiratory imaging,
was performed.

[Series 3: standard chest · axial · 0.78mm/px · z∈[+1304,+1550]mm · 8 of 149 slices shown]
[im 13/149  mediastinal]
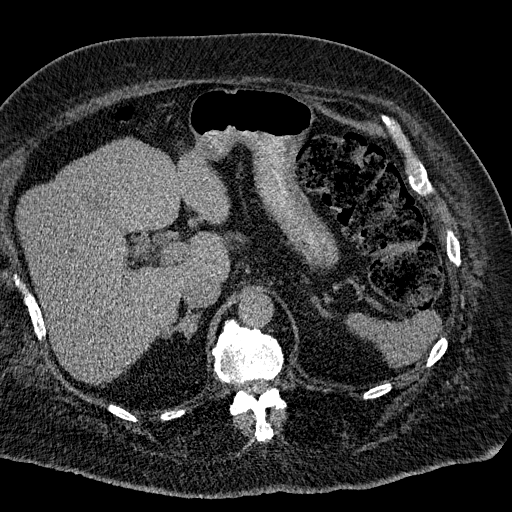
[im 31/149  mediastinal]
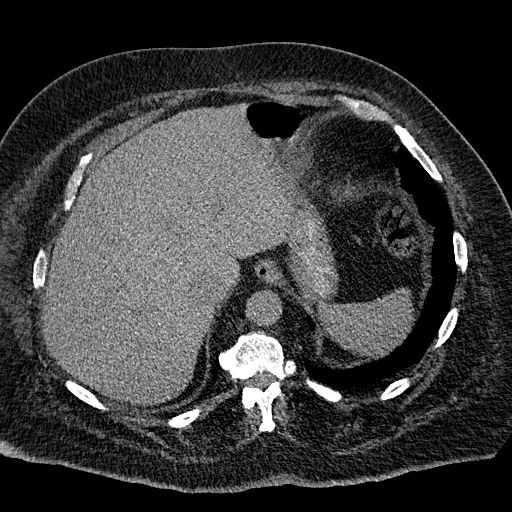
[im 50/149  mediastinal]
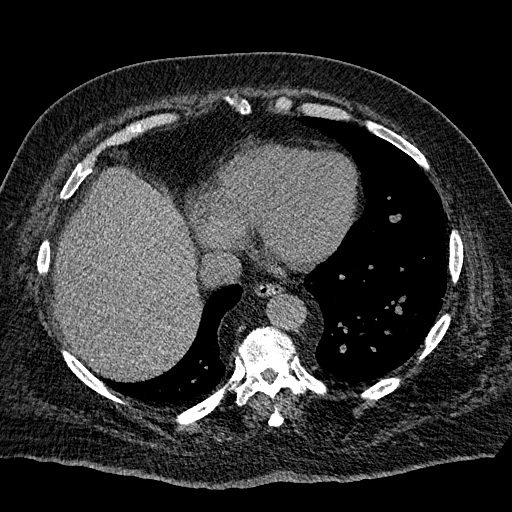
[im 68/149  mediastinal]
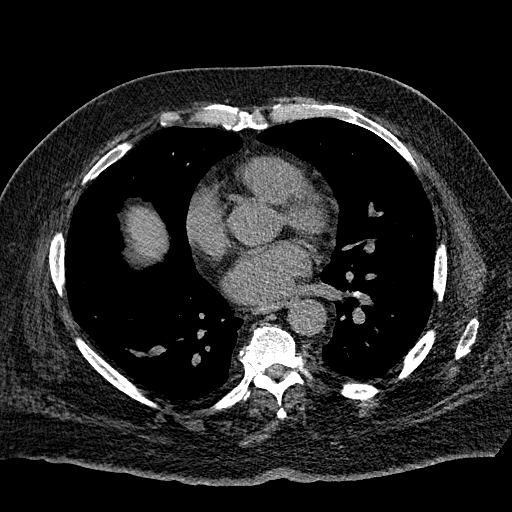
[im 81/149  mediastinal]
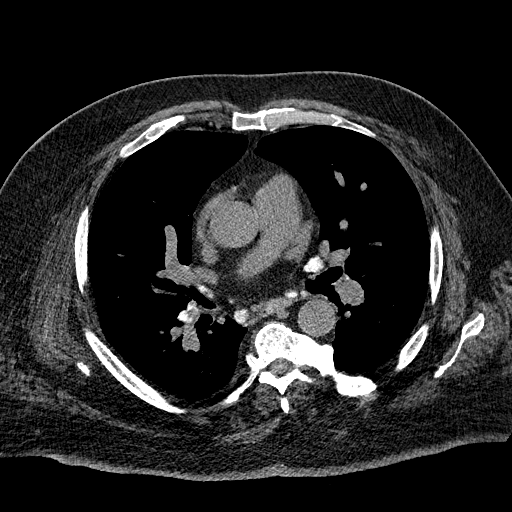
[im 99/149  mediastinal]
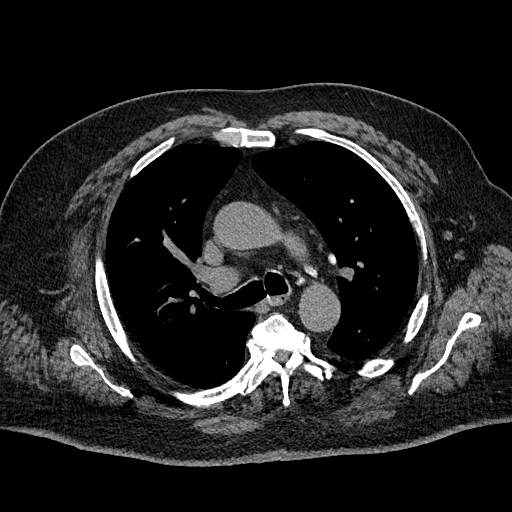
[im 118/149  mediastinal]
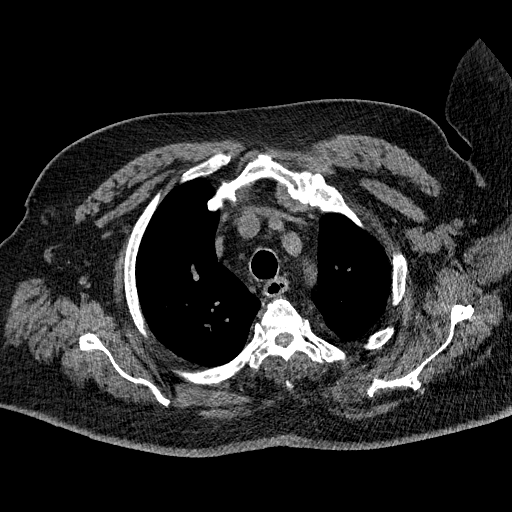
[im 136/149  mediastinal]
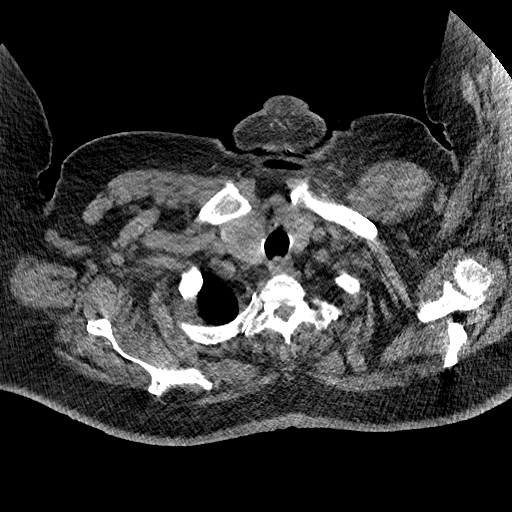

[Series 5: high res exp · axial · 0.78mm/px · z∈[+1336,+1578]mm · 3 of 18 slices shown, 4 images]
[im 1/18  mediastinal]
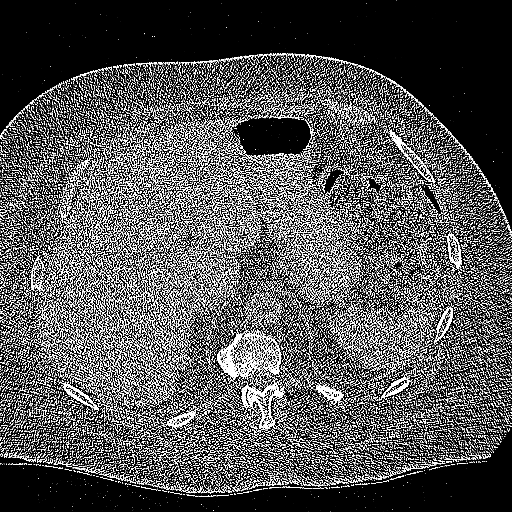
[im 1/18  lung]
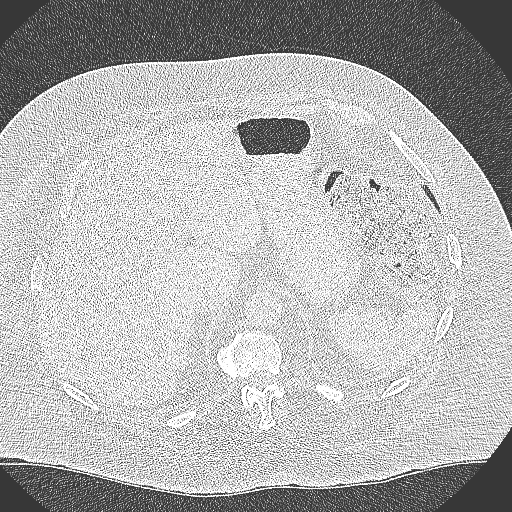
[im 9/18  lung]
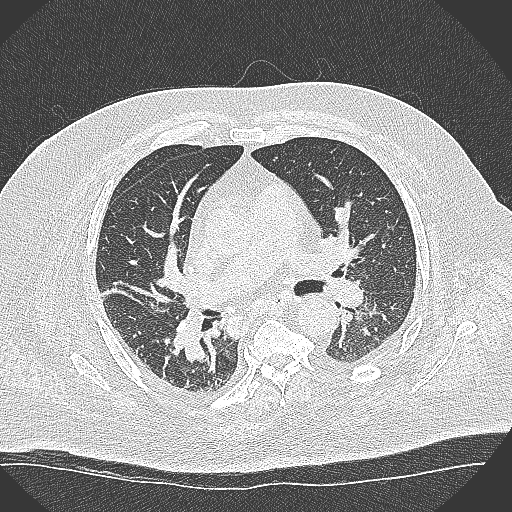
[im 18/18  lung]
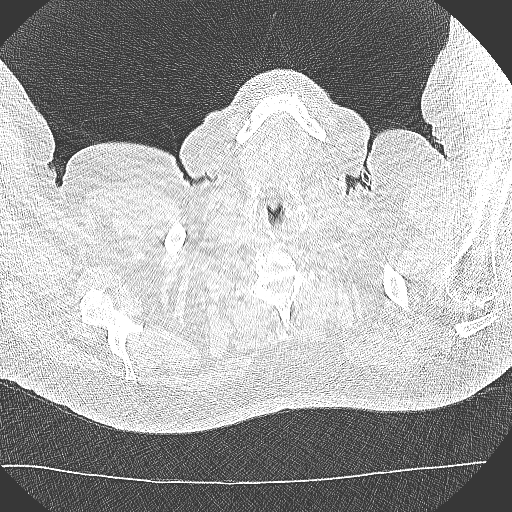

[Series 9: coronal · coronal · 0.62mm/px · 3 of 181 slices shown]
[im 37/181  lung]
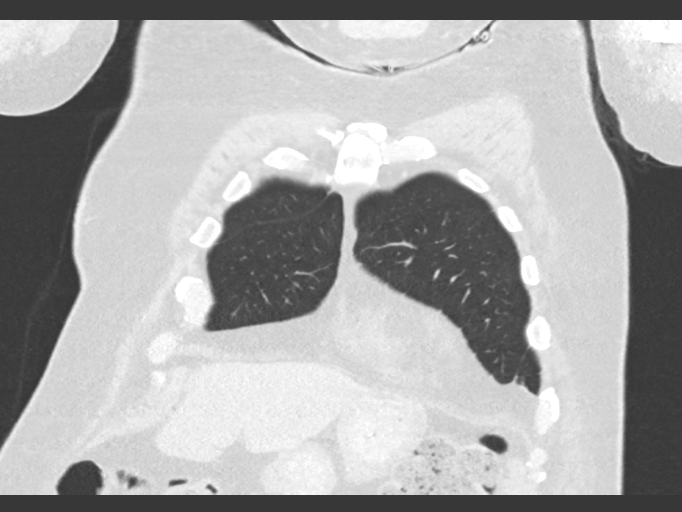
[im 73/181  lung]
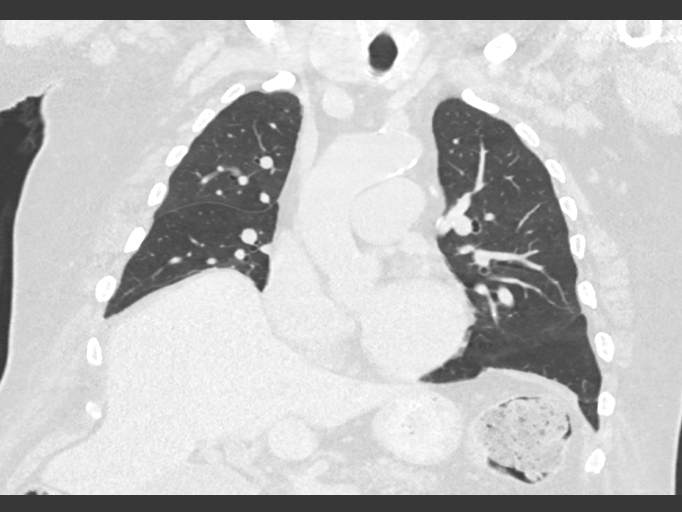
[im 109/181  lung]
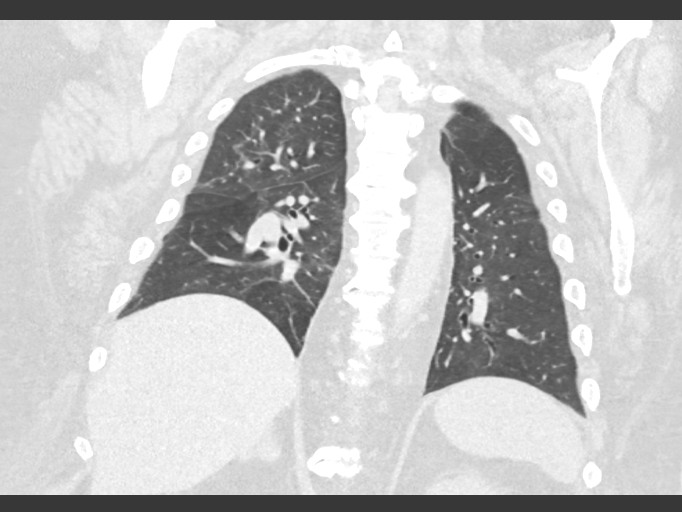

[14 of 36 positions shown; findings below may reference images not displayed]

FINDINGS: Cardiovascular: Aortic atherosclerosis. Normal heart size. Coronary
artery calcifications. No pericardial effusion.

Mediastinum/Nodes: There are numerous prominent, hyperdense
mediastinal and hilar lymph nodes. There is a nodule of the right
lobe of the thyroid measuring at least 3.6 cm, previously assessed
by ultrasound and biopsy. Trachea, and esophagus demonstrate no
significant findings.

Lungs/Pleura: There is irregular, bandlike scarring and volume loss
of right lung, primarily involving the right lower lobe, not
significantly changed. No air trapping on expiratory phase imaging.
There is a stable, benign 4 mm pulmonary nodule of the left lower
lobe (series 7, image 81). No pleural effusion or pneumothorax.

Upper Abdomen: No acute abnormality.

Musculoskeletal: No chest wall mass or suspicious bone lesions
identified.
IMPRESSION: 1. Irregular, bandlike scarring and volume loss of the right lung,
primarily involving the right lower lobe. Findings are not
significantly changed compared to prior examinations and most
consistent with post infectious or inflammatory scarring and volume
loss. There are no specific findings to suggest fibrotic
interstitial lung disease.
2. No new pulmonary nodule or other findings to explain queried
abnormality of the left lung base on prior chest radiograph.
3. There are numerous prominent, hyperdense, although not
pathologically enlarged mediastinal and hilar lymph nodes, unchanged
compared to prior examination and most consistent with nodal
involvement of sarcoidosis, although occupational inhalational lung
disease may also have this appearance.
4. Coronary artery disease. Aortic Atherosclerosis ([id number]).

## 2021-09-19 DIAGNOSIS — J3489 Other specified disorders of nose and nasal sinuses: Secondary | ICD-10-CM | POA: Diagnosis not present

## 2021-09-19 DIAGNOSIS — R0981 Nasal congestion: Secondary | ICD-10-CM | POA: Diagnosis not present

## 2021-09-19 DIAGNOSIS — E109 Type 1 diabetes mellitus without complications: Secondary | ICD-10-CM | POA: Diagnosis not present

## 2021-09-19 DIAGNOSIS — Z681 Body mass index (BMI) 19 or less, adult: Secondary | ICD-10-CM | POA: Diagnosis not present

## 2021-09-19 DIAGNOSIS — G8929 Other chronic pain: Secondary | ICD-10-CM | POA: Diagnosis not present

## 2021-10-16 ENCOUNTER — Other Ambulatory Visit: Payer: Self-pay

## 2021-10-16 ENCOUNTER — Encounter: Payer: Self-pay | Admitting: Urology

## 2021-10-16 ENCOUNTER — Ambulatory Visit (INDEPENDENT_AMBULATORY_CARE_PROVIDER_SITE_OTHER): Payer: Medicare Other | Admitting: Urology

## 2021-10-16 DIAGNOSIS — N3941 Urge incontinence: Secondary | ICD-10-CM

## 2021-10-16 DIAGNOSIS — N5201 Erectile dysfunction due to arterial insufficiency: Secondary | ICD-10-CM | POA: Diagnosis not present

## 2021-10-16 DIAGNOSIS — N390 Urinary tract infection, site not specified: Secondary | ICD-10-CM

## 2021-10-16 DIAGNOSIS — R350 Frequency of micturition: Secondary | ICD-10-CM | POA: Diagnosis not present

## 2021-10-16 DIAGNOSIS — R319 Hematuria, unspecified: Secondary | ICD-10-CM

## 2021-10-16 LAB — URINALYSIS, ROUTINE W REFLEX MICROSCOPIC
Bilirubin, UA: NEGATIVE
Glucose, UA: NEGATIVE
Ketones, UA: NEGATIVE
Nitrite, UA: POSITIVE — AB
Specific Gravity, UA: 1.015 (ref 1.005–1.030)
Urobilinogen, Ur: 0.2 mg/dL (ref 0.2–1.0)
pH, UA: 8.5 — ABNORMAL HIGH (ref 5.0–7.5)

## 2021-10-16 LAB — MICROSCOPIC EXAMINATION
Renal Epithel, UA: NONE SEEN /hpf
WBC, UA: >30 /hpf — AB (ref 0–5)

## 2021-10-16 MED ORDER — TROSPIUM CHLORIDE 20 MG PO TABS: 20.0000 mg | ORAL_TABLET | Freq: Two times a day (BID) | ORAL | 3 refills | Status: DC

## 2021-10-16 MED ORDER — GEMTESA 75 MG PO TABS: 75.0000 mg | ORAL_TABLET | Freq: Every day | ORAL | 0 refills | Status: DC

## 2021-10-16 NOTE — Progress Notes (Signed)
Subjective:  1. Urge incontinence   2. Urinary frequency   3. Urinary tract infection with hematuria, site unspecified   4. Erectile dysfunction due to arterial insufficiency      CC: Incontinence   [name] return today in f/u. He has a several year history of OAB wet with UUI and has completed PTNS induction and was on maintenance q4wks but has been off of that for over a year.  He remains on Trospium but he has had his diuretic increased and he will have increased am frequency as a result.   He had been given Gemtesa samples and took those with the trospium with improvement in his symptoms but he had increased leg swelling.   He didn't benefit from the [place] alone.   He has rare nocturia.  He has urgency with persistent UUI and wears a pad.. He has had no hematuria or dysuria. He has frequency q2-3hrs. He has no hesitancy but a variable stream with some intermittency. His IPSS is 15.   His UA looks infected today with >30 WBC, 3-10 RBC and many bacteria.      He has ED and has some response to sildenafil.  He has not been active because of COVID concerns.      IPSS     Row Name 10/16/21 0900         International Prostate Symptom Score   How often have you had the sensation of not emptying your bladder? Less than 1 in 5     How often have you had to urinate less than every two hours? More than half the time     How often have you found you stopped and started again several times when you urinated? Not at All     How often have you found it difficult to postpone urination? Almost always     How often have you had a weak urinary stream? About half the time     How often have you had to strain to start urination? Less than half the time     How many times did you typically get up at night to urinate? None     Total IPSS Score 15       Quality of Life due to urinary symptoms   If you were to spend the rest of your life with your urinary condition just the way it is now how would  you feel about that? Mostly Satisfied                ROS:  ROS:  A complete review of systems was performed.  All systems are negative except for pertinent findings as noted.   ROS  Allergies  Allergen Reactions   Covid-19 Mrna Vaccine [employer]) [Covid-19 Mrna Vacc (Moderna)] Other (See Comments)    Joint pain and fatigue   Sulfa Antibiotics Swelling    eyelids    Outpatient Encounter Medications as of 10/16/2021  Medication Sig   acetaminophen (TYLENOL) 500 MG tablet Take 500 mg by mouth every 6 (six) hours as needed for mild pain or moderate pain (In addition to Hydrocodone/Apap as needed for pain).   amLODipine (NORVASC) 10 MG tablet Take 1 tablet by mouth once daily (Patient taking differently: Take 10 mg by mouth daily.)   cloNIDine (CATAPRES) 0.2 MG tablet Take 1 tablet (0.2 mg total) by mouth 3 (three) times daily.   cromolyn (NASALCROM) 5.2 MG/ACT nasal spray Place 1 spray into both nostrils 3 (three) times daily  as needed for allergies.    famotidine (PEPCID) 20 MG tablet Take 20 mg by mouth 2 (two) times daily.    glipiZIDE (GLUCOTROL) 10 MG tablet Take 10 mg by mouth 2 (two) times daily.    HYDROcodone-acetaminophen (NORCO) 10-325 MG tablet Take 1 tablet by mouth 3 (three) times daily as needed for moderate pain.    Krill Oil 500 MG CAPS Take 500 mg by mouth daily.    latanoprost (XALATAN) 0.005 % ophthalmic solution INSTILL 1 DROP IN BOTH EYES EVERY DAY AT BEDTIME- NEEDS TO FOLLOWUP WITH GLAUCOMA DOCTOR FOR REFILLS   losartan (COZAAR) 100 MG tablet Take 1 tablet (100 mg total) by mouth daily.   meclizine (ANTIVERT) 25 MG tablet Take 25 mg by mouth 3 (three) times daily as needed for dizziness or nausea (when taking hydrocodone/apap).    meloxicam (MOBIC) 7.5 MG tablet Take 7.5 mg by mouth 2 (two) times daily.   metFORMIN (GLUCOPHAGE-XR) 500 MG 24 hr tablet Take 1,000 mg by mouth 2 (two) times daily.   metoprolol succinate (TOPROL-XL) 50 MG 24 hr tablet Take 1 tablet  (50 mg total) by mouth daily. (Patient taking differently: Take 50 mg by mouth daily. Pt states 2037m)   mupirocin ointment (BACTROBAN) 2 % 1 application 2 (two) times daily.   OXYGEN Inhale 2 L/hr into the lungs.   psyllium (METAMUCIL) 58.6 % powder Take 1 packet by mouth 2 (two) times daily.    Vibegron (GEMTESA) 75 MG TABS Take 75 mg by mouth daily.   Vitamin D, Ergocalciferol, (DRISDOL) 1.25 MG (50000 UNIT) CAPS capsule Take by mouth.   [DISCONTINUED] trospium (SANCTURA) 20 MG tablet Take 1 tablet (20 mg total) by mouth 2 (two) times daily.   trospium (SANCTURA) 20 MG tablet Take 1 tablet (20 mg total) by mouth 2 (two) times daily.   [DISCONTINUED] metroNIDAZOLE (METROGEL) 1 % gel Apply topically daily.   No facility-administered encounter medications on file as of 10/16/2021.    Past Medical History:  Diagnosis Date   Allergic rhinitis    Aneurysm artery, neck (HCC)    Dr. YAnnamaria Bootspulmonologist   Degenerative disk disease    Essential hypertension    GERD (gastroesophageal reflux disease)    H/O cardiovascular stress test 02/02/11   EF 59% - Normal stress test   Hearing deficit, right    Knee pain, bilateral    Lower back pain    Morbid obesity (HCC)    OSA (obstructive sleep apnea)    On CPAP   Thyroid nodule    Type 2 diabetes mellitus ([facility] [place]    Type 2 diabetes mellitus without complication ([facility] 81/97/5883  Qualifier: Diagnosis of  By: YAnnamaria BootsMD, Clinton D     Past Surgical History:  Procedure Laterality Date   APPENDECTOMY     CARDIAC CATHETERIZATION     Cataract surgery Bilateral    COLONOSCOPY WITH PROPOFOL N/A 08/01/2019   Procedure: COLONOSCOPY WITH PROPOFOL;  Surgeon: JAviva Signs MD;  Location: [facility];  Service: General;  Laterality: N/A;   KNEE ARTHROSCOPY Bilateral    NASAL SEPTOPLASTY W/ TURBINOPLASTY     RIGHT/LEFT HEART CATH AND CORONARY ANGIOGRAPHY N/A 06/23/2018   Procedure: RIGHT/LEFT HEART CATH AND CORONARY ANGIOGRAPHY;  Surgeon: MLarey Dresser  MD;  [facility];  Service: Cardiovascular;  Laterality: N/A;    Social History   Socioeconomic History   Marital status: Widowed    Spouse name: Not on file   Number of  children: Not on file   Years of education: Not on file   Highest education level: Not on file  Occupational History   Occupation: former [name], tow truck service  Tobacco Use   Smoking status: Former    Types: Cigarettes    Quit date: 1971    Years since quitting: 51.9   Smokeless tobacco: Never   Tobacco comments:    smoked when he was a teenager  Scientific laboratory technician Use: Never used  Substance and Sexual Activity   Alcohol use: No   Drug use: No   Sexual activity: Not on file  Other Topics Concern   Not on file  Social History Narrative   Not on file   Social Determinants of Health   Financial Resource Strain: Not on file  Food Insecurity: Not on file  Transportation Needs: Not on file  Physical Activity: Not on file  Stress: Not on file  Social Connections: Not on file  Intimate Partner Violence: Not on file    Family History  Problem Relation Age of Onset   Lung disease Father    Stroke Father 33   Diabetes Father    Diabetes Maternal Grandfather    Heart disease Paternal Grandmother    Stroke Paternal Grandfather 65   Diabetes Paternal Grandfather    Diabetes Mother    Cancer Mother    Stroke Mother 64   Diabetes Sister    Diabetes Brother 36       Objective: There were no vitals filed for this visit.    Physical Exam  Lab Results:  Results for orders placed or performed in visit on 10/16/21 (from the past 24 hour(s))  Urinalysis, Routine w reflex microscopic     Status: Abnormal   Collection Time: 10/16/21 10:23 AM  Result Value Ref Range   Specific Gravity, UA 1.015 1.005 - 1.030   pH, UA 8.5 (H) 5.0 - 7.5   Color, UA Yellow Yellow   Appearance Ur Cloudy (A) Clear   Leukocytes,UA 2+ (A) Negative   Protein,UA 1+ (A) Negative/Trace   Glucose, UA  Negative Negative   Ketones, UA Negative Negative   RBC, UA Trace (A) Negative   Bilirubin, UA Negative Negative   Urobilinogen, Ur 0.2 0.2 - 1.0 mg/dL   Nitrite, UA Positive (A) Negative   Microscopic Examination See below:    Narrative   Performed at:  [facility] [address], [place], [place]  [id number] Lab Director: Mina Marble MT, Phone:  [phone]  Microscopic Examination     Status: Abnormal   Collection Time: 10/16/21 10:23 AM   Urine  Result Value Ref Range   WBC, UA >30 (A) 0 - 5 /hpf   RBC 3-10 (A) 0 - 2 /hpf   Epithelial Cells (non renal) 0-10 0 - 10 /hpf   Renal Epithel, UA None seen None seen /hpf   Mucus, UA Present Not Estab.   Bacteria, UA Many (A) None seen/Few   Narrative   Performed at:  [facility] [address] [place], [place]  [id number] Lab Director: Hustler, Phone:  [phone]    BMET No results for input(s): NA, K, CL, CO2, GLUCOSE, BUN, CREATININE, CALCIUM in the last 72 hours. PSA No results found for: PSA No results found for: TESTOSTERONE  UA is clear.   Studies/Results: No results found. I have reviewed his records and labs in Epic.   Assessment & Plan: Urge incontinence remains  which is partially controlled with Trospium.  He got some benefit from 1/2 tab of Gemtesa in addition but had some leg swelling.  I will give him additional samples to try again as the Gemtesa may not have been the cause.  UTI.  Urine looks infected today.  I will culture and treat as indicated.     ED.   He has not been active but reports a response to sildenafil.    Meds ordered this encounter  Medications   trospium (SANCTURA) 20 MG tablet    Sig: Take 1 tablet (20 mg total) by mouth 2 (two) times daily.    Dispense:  180 tablet    Refill:  3   Vibegron (GEMTESA) 75 MG TABS    Sig: Take 75 mg by mouth daily.    Dispense:  28 tablet    Refill:  0      Orders Placed This Encounter  Procedures   Urine  Culture   Microscopic Examination   Urinalysis, Routine w reflex microscopic      Return in about 6 months (around 04/16/2022).   CC: Sharilyn Sites, MD      Irine Seal 10/16/2021 Patient ID: Colton Cecil., male   DOB: May 06, 1951, 70 y.o.   MRN: [id number]

## 2021-10-16 NOTE — Progress Notes (Signed)
Urological Symptom Review  Patient is experiencing the following symptoms: Frequent urination Hard to postpone urination Leakage of urine Weak stream   Review of Systems  Gastrointestinal (upper)  : Indigestion/heartburn  Gastrointestinal (lower) : Constipation  Constitutional : Fatigue  Skin: Skin rash/lesion  Eyes: Negative for eye symptoms  Ear/Nose/Throat : Negative for Ear/Nose/Throat symptoms  Hematologic/Lymphatic: Negative for Hematologic/Lymphatic symptoms  Cardiovascular : Leg swelling Negative for cardiovascular symptoms  Respiratory : Shortness of breath  Endocrine: Excessive thirst  Musculoskeletal: Back pain  Neurological: Negative for neurological symptoms  Psychologic: Negative for psychiatric symptoms

## 2021-10-20 DIAGNOSIS — Z6841 Body Mass Index (BMI) 40.0 and over, adult: Secondary | ICD-10-CM | POA: Diagnosis not present

## 2021-10-20 DIAGNOSIS — G894 Chronic pain syndrome: Secondary | ICD-10-CM | POA: Diagnosis not present

## 2021-10-21 LAB — URINE CULTURE

## 2021-10-22 ENCOUNTER — Telehealth: Payer: Self-pay

## 2021-10-22 ENCOUNTER — Ambulatory Visit (INDEPENDENT_AMBULATORY_CARE_PROVIDER_SITE_OTHER): Payer: Medicare Other | Admitting: Ophthalmology

## 2021-10-22 ENCOUNTER — Other Ambulatory Visit: Payer: Self-pay

## 2021-10-22 DIAGNOSIS — E113492 Type 2 diabetes mellitus with severe nonproliferative diabetic retinopathy without macular edema, left eye: Secondary | ICD-10-CM

## 2021-10-22 DIAGNOSIS — E113491 Type 2 diabetes mellitus with severe nonproliferative diabetic retinopathy without macular edema, right eye: Secondary | ICD-10-CM

## 2021-10-22 MED ORDER — CEPHALEXIN 500 MG PO CAPS: 500.0000 mg | ORAL_CAPSULE | Freq: Three times a day (TID) | ORAL | 0 refills | Status: DC

## 2021-10-22 NOTE — Telephone Encounter (Signed)
-----  Message from Irine Seal, MD sent at 10/21/2021  5:09 PM EST ----- He needs keflex 538m po tid #21.  ----- Message ----- From: CIris Pert LPN Sent: [id number]/04/5536  2:24 PM EST To: JIrine Seal MD  No treatment started

## 2021-10-22 NOTE — Assessment & Plan Note (Addendum)
The nature of severe nonproliferative diabetic retinopathy discussed with the patient as well as the need for more frequent follow up and likely progression to proliferative disease in the near future. The options of continued observation versus panretinal photocoagulation at this time were reviewed as well as the risks, benefits, and alternatives. More recent option includes the use of ocular injectable medications to slow progression of retinal disease. Tight control of glucose, blood pressure, and serum lipid levels were recommended under the direction of general physician or endocrinologist, as well as avoidance of smoking and maintenance of normal body weight. The 2-year risk of progression to proliferative diabetic retinopathy is 60%.  No signs of significant progression observe

## 2021-10-22 NOTE — Telephone Encounter (Signed)
Rx sent. Patient called with no answer.  Detailed message left. Request call back to confirm message was received.

## 2021-10-22 NOTE — Progress Notes (Signed)
10/22/2021     CHIEF COMPLAINT Patient presents for  Chief Complaint  Patient presents with   Retina Evaluation      HISTORY OF PRESENT ILLNESS: Colton Jackson. is a 70 y.o. male who presents to the clinic today for:     Referring physician: Sharilyn Sites, MD [address] [place],  [place] [address]  HISTORICAL INFORMATION:   Selected notes from the [facility]: Current Outpatient Medications (Ophthalmic Drugs)  Medication Sig   latanoprost (XALATAN) 0.005 % ophthalmic solution INSTILL 1 DROP IN BOTH EYES EVERY DAY AT BEDTIME- NEEDS TO FOLLOWUP WITH [facility] DOCTOR FOR REFILLS   No current facility-administered medications for this visit. (Ophthalmic Drugs)   Current Outpatient Medications (Other)  Medication Sig   acetaminophen (TYLENOL) 500 MG tablet Take 500 mg by mouth every 6 (six) hours as needed for mild pain or moderate pain (In addition to Hydrocodone/Apap as needed for pain).   amLODipine (NORVASC) 10 MG tablet Take 1 tablet by mouth once daily (Patient taking differently: Take 10 mg by mouth daily.)   cephALEXin (KEFLEX) 500 MG capsule Take 1 capsule (500 mg total) by mouth 3 (three) times daily.   cloNIDine (CATAPRES) 0.2 MG tablet Take 1 tablet (0.2 mg total) by mouth 3 (three) times daily.   cromolyn (NASALCROM) 5.2 MG/ACT nasal spray Place 1 spray into both nostrils 3 (three) times daily as needed for allergies.    famotidine (PEPCID) 20 MG tablet Take 20 mg by mouth 2 (two) times daily.    glipiZIDE (GLUCOTROL) 10 MG tablet Take 10 mg by mouth 2 (two) times daily.    HYDROcodone-acetaminophen (NORCO) 10-325 MG tablet Take 1 tablet by mouth 3 (three) times daily as needed for moderate pain.    Krill Oil 500 MG CAPS Take 500 mg by mouth daily.    losartan (COZAAR) 100 MG tablet Take 1 tablet (100 mg total) by mouth daily.   meclizine (ANTIVERT) 25 MG tablet Take 25 mg by mouth 3 (three) times daily as needed for  dizziness or nausea (when taking hydrocodone/apap).    meloxicam (MOBIC) 7.5 MG tablet Take 7.5 mg by mouth 2 (two) times daily.   metFORMIN (GLUCOPHAGE-XR) 500 MG 24 hr tablet Take 1,000 mg by mouth 2 (two) times daily.   metoprolol succinate (TOPROL-XL) 50 MG 24 hr tablet Take 1 tablet (50 mg total) by mouth daily. (Patient taking differently: Take 50 mg by mouth daily. Pt states 2050m)   mupirocin ointment (BACTROBAN) 2 % 1 application 2 (two) times daily.   OXYGEN Inhale 2 L/hr into the lungs.   psyllium (METAMUCIL) 58.6 % powder Take 1 packet by mouth 2 (two) times daily.    trospium (SANCTURA) 20 MG tablet Take 1 tablet (20 mg total) by mouth 2 (two) times daily.   Vibegron (GEMTESA) 75 MG TABS Take 75 mg by mouth daily.   Vitamin D, Ergocalciferol, (DRISDOL) 1.25 MG (50000 UNIT) CAPS capsule Take by mouth.   No current facility-administered medications for this visit. (Other)      REVIEW OF SYSTEMS:    ALLERGIES Allergies  Allergen Reactions   Covid-19 Mrna Vaccine ([employer] [[phone] Vacc (Moderna)] Other (See Comments)    Joint pain and fatigue   Sulfa Antibiotics Swelling    eyelids    PAST MEDICAL HISTORY Past Medical History:  Diagnosis Date   Allergic rhinitis    Aneurysm artery, neck (HCC)    Dr. YAnnamaria Boots  pulmonologist   Degenerative disk disease    Essential hypertension    GERD (gastroesophageal reflux disease)    H/O cardiovascular stress test 02/02/11   EF 59% - Normal stress test   Hearing deficit, right    Knee pain, bilateral    Lower back pain    Morbid obesity (HCC)    OSA (obstructive sleep apnea)    On CPAP   Thyroid nodule    Type 2 diabetes mellitus ([facility])    Type 2 diabetes mellitus without complication ([place]) 2/54/9826   Qualifier: Diagnosis of  By: Annamaria Boots MD, Clinton D    Past Surgical History:  Procedure Laterality Date   APPENDECTOMY     CARDIAC CATHETERIZATION     Cataract surgery Bilateral    COLONOSCOPY WITH PROPOFOL N/A  08/01/2019   Procedure: COLONOSCOPY WITH PROPOFOL;  Surgeon: Aviva Signs, MD;  Location: [facility];  Service: General;  Laterality: N/A;   KNEE ARTHROSCOPY Bilateral    NASAL SEPTOPLASTY W/ TURBINOPLASTY     RIGHT/LEFT HEART CATH AND CORONARY ANGIOGRAPHY N/A 06/23/2018   Procedure: RIGHT/LEFT HEART CATH AND CORONARY ANGIOGRAPHY;  Surgeon: Larey Dresser, MD;  Location: [facility];  Service: Cardiovascular;  Laterality: N/A;    FAMILY HISTORY Family History  Problem Relation Age of Onset   Lung disease Father    Stroke Father 43   Diabetes Father    Diabetes Maternal Grandfather    Heart disease Paternal Grandmother    Stroke Paternal Grandfather 51   Diabetes Paternal Grandfather    Diabetes Mother    Cancer Mother    Stroke Mother 88   Diabetes Sister    Diabetes Brother 9    SOCIAL HISTORY Social History   Tobacco Use   Smoking status: Former    Types: Cigarettes    Quit date: 1971    Years since quitting: 51.9   Smokeless tobacco: Never   Tobacco comments:    smoked when he was a teenager  Scientific laboratory technician Use: Never used  Substance Use Topics   Alcohol use: No   Drug use: No         OPHTHALMIC EXAM:  Base Eye Exam     Visual Acuity (ETDRS)       Right Left   Dist [place] 20/20 20/20         Tonometry (Tonopen, 11:28 AM)       Right Left   Pressure 14 11         Pupils       Pupils APD   Right PERRL None   Left PERRL None         Visual Fields       Left Right    Full Full         Extraocular Movement       Right Left    Full, Ortho Full, Ortho         Neuro/Psych     Oriented x3: Yes   Mood/Affect: Normal           Slit Lamp and Fundus Exam     External Exam       Right Left   External Normal Normal         Slit Lamp Exam       Right Left   Lids/Lashes Normal Normal   Conjunctiva/Sclera White and quiet White and quiet   Cornea Clear Clear   Anterior Chamber Deep and quiet Deep  and  quiet   Iris Round and reactive Round and reactive   Lens Posterior chamber intraocular lens Posterior chamber intraocular lens   Anterior Vitreous Normal Normal         Fundus Exam       Right Left   Posterior Vitreous Normal Normal   Disc Normal Normal   C/D Ratio 0.7 0.7   Macula Microaneurysms, no clinically significant macular edema, no exudates, no macular thickening Microaneurysms, no clinically significant macular edema, no exudates, no macular thickening   Vessels NPDR-Severe NPDR-Severe   Periphery Normal Normal            IMAGING AND PROCEDURES  Imaging and Procedures for 10/22/21  Color Fundus Photography Optos - OU - Both Eyes       Right Eye Progression has no prior data. Disc findings include increased cup to disc ratio. Macula : microaneurysms.   Left Eye Progression has no prior data. Disc findings include increased cup to disc ratio. Macula : microaneurysms.   Notes Severe NPDR yet no progression OU will observe             ASSESSMENT/PLAN:  Severe nonproliferative diabetic retinopathy of left eye (HCC) The nature of severe nonproliferative diabetic retinopathy discussed with the patient as well as the need for more frequent follow up and likely progression to proliferative disease in the near future. The options of continued observation versus panretinal photocoagulation at this time were reviewed as well as the risks, benefits, and alternatives. More recent option includes the use of ocular injectable medications to slow progression of retinal disease. Tight control of glucose, blood pressure, and serum lipid levels were recommended under the direction of general physician or endocrinologist, as well as avoidance of smoking and maintenance of normal body weight. The 2-year risk of progression to proliferative diabetic retinopathy is 60%.  No signs of significant progression observe  Severe nonproliferative diabetic retinopathy of right eye  (HCC) The nature of severe nonproliferative diabetic retinopathy discussed with the patient as well as the need for more frequent follow up and likely progression to proliferative disease in the near future. The options of continued observation versus panretinal photocoagulation at this time were reviewed as well as the risks, benefits, and alternatives. More recent option includes the use of ocular injectable medications to slow progression of retinal disease. Tight control of glucose, blood pressure, and serum lipid levels were recommended under the direction of general physician or endocrinologist, as well as avoidance of smoking and maintenance of normal body weight. The 2-year risk of progression to proliferative diabetic retinopathy is 60%.  No progression observe      ICD-10-CM   1. Severe nonproliferative diabetic retinopathy of left eye associated with type 2 diabetes mellitus, macular edema presence unspecified (HCC)  M40.3754 Color Fundus Photography Optos - OU - Both Eyes    CANCELED: OCT, Retina - OU - Both Eyes    2. Severe nonproliferative diabetic retinopathy of right eye without macular edema associated with type 2 diabetes mellitus (HCC)  H60.6770 Color Fundus Photography Optos - OU - Both Eyes    CANCELED: OCT, Retina - OU - Both Eyes      1.  OU with stable level of retinopathy and stable acuity.  2.  3.  Ophthalmic Meds Ordered this visit:  No orders of the defined types were placed in this encounter.      Return in about 6 months (around 04/22/2022) for DILATE OU, OCT.  There are no Patient  Instructions on file for this visit.   Explained the diagnoses, plan, and follow up with the patient and they expressed understanding.  Patient expressed understanding of the importance of proper follow up care.   Clent Demark Emaad Nanna M.D. Diseases & Surgery of the Retina and Vitreous [facility] 10/22/21     Abbreviations: M myopia (nearsighted); A  astigmatism; H hyperopia (farsighted); P presbyopia; Mrx spectacle prescription;  CTL contact lenses; OD right eye; OS left eye; OU both eyes  XT exotropia; ET esotropia; PEK punctate epithelial keratitis; PEE punctate epithelial erosions; DES dry eye syndrome; MGD meibomian gland dysfunction; ATs artificial tears; PFAT's preservative free artificial tears; [place] nuclear sclerotic cataract; PSC posterior subcapsular cataract; ERM epi-retinal membrane; PVD posterior vitreous detachment; RD retinal detachment; DM diabetes mellitus; DR diabetic retinopathy; NPDR non-proliferative diabetic retinopathy; PDR proliferative diabetic retinopathy; CSME clinically significant macular edema; DME diabetic macular edema; dbh dot blot hemorrhages; CWS cotton wool spot; POAG primary open angle glaucoma; C/D cup-to-disc ratio; HVF humphrey visual field; GVF goldmann visual field; OCT optical coherence tomography; IOP intraocular pressure; BRVO Branch retinal vein occlusion; CRVO central retinal vein occlusion; CRAO central retinal artery occlusion; BRAO branch retinal artery occlusion; RT retinal tear; SB scleral buckle; PPV pars plana vitrectomy; VH Vitreous hemorrhage; PRP panretinal laser photocoagulation; IVK intravitreal kenalog; VMT vitreomacular traction; MH Macular hole;  NVD neovascularization of the disc; NVE neovascularization elsewhere; AREDS age related eye disease study; ARMD age related macular degeneration; POAG primary open angle glaucoma; EBMD epithelial/anterior basement membrane dystrophy; ACIOL anterior chamber intraocular lens; IOL intraocular lens; PCIOL posterior chamber intraocular lens; Phaco/IOL phacoemulsification with intraocular lens placement; [place] photorefractive keratectomy; LASIK laser assisted in situ keratomileusis; HTN hypertension; DM diabetes mellitus; COPD chronic obstructive pulmonary disease

## 2021-10-22 NOTE — Telephone Encounter (Signed)
Patient returned call and made aware of positive urine culture and antibiotic sent to pharmacy.

## 2021-10-22 NOTE — Assessment & Plan Note (Addendum)
The nature of severe nonproliferative diabetic retinopathy discussed with the patient as well as the need for more frequent follow up and likely progression to proliferative disease in the near future. The options of continued observation versus panretinal photocoagulation at this time were reviewed as well as the risks, benefits, and alternatives. More recent option includes the use of ocular injectable medications to slow progression of retinal disease. Tight control of glucose, blood pressure, and serum lipid levels were recommended under the direction of general physician or endocrinologist, as well as avoidance of smoking and maintenance of normal body weight. The 2-year risk of progression to proliferative diabetic retinopathy is 60%.  No progression observe

## 2021-10-23 DIAGNOSIS — E119 Type 2 diabetes mellitus without complications: Secondary | ICD-10-CM | POA: Diagnosis not present

## 2021-11-20 DIAGNOSIS — Z6841 Body Mass Index (BMI) 40.0 and over, adult: Secondary | ICD-10-CM | POA: Diagnosis not present

## 2021-11-20 DIAGNOSIS — J449 Chronic obstructive pulmonary disease, unspecified: Secondary | ICD-10-CM | POA: Diagnosis not present

## 2021-11-20 DIAGNOSIS — E785 Hyperlipidemia, unspecified: Secondary | ICD-10-CM | POA: Diagnosis not present

## 2021-11-20 DIAGNOSIS — G894 Chronic pain syndrome: Secondary | ICD-10-CM | POA: Diagnosis not present

## 2021-11-20 DIAGNOSIS — E109 Type 1 diabetes mellitus without complications: Secondary | ICD-10-CM | POA: Diagnosis not present

## 2021-11-20 DIAGNOSIS — K5903 Drug induced constipation: Secondary | ICD-10-CM | POA: Diagnosis not present

## 2021-11-20 DIAGNOSIS — I700 Atherosclerosis of aorta: Secondary | ICD-10-CM | POA: Diagnosis not present

## 2021-11-20 DIAGNOSIS — I5030 Unspecified diastolic (congestive) heart failure: Secondary | ICD-10-CM | POA: Diagnosis not present

## 2021-11-20 DIAGNOSIS — M1991 Primary osteoarthritis, unspecified site: Secondary | ICD-10-CM | POA: Diagnosis not present

## 2021-11-20 DIAGNOSIS — B351 Tinea unguium: Secondary | ICD-10-CM | POA: Diagnosis not present

## 2021-11-20 DIAGNOSIS — I10 Essential (primary) hypertension: Secondary | ICD-10-CM | POA: Diagnosis not present

## 2021-11-24 DIAGNOSIS — Z20822 Contact with and (suspected) exposure to covid-19: Secondary | ICD-10-CM | POA: Diagnosis not present

## 2021-12-11 DIAGNOSIS — Z20822 Contact with and (suspected) exposure to covid-19: Secondary | ICD-10-CM | POA: Diagnosis not present

## 2021-12-22 DIAGNOSIS — E785 Hyperlipidemia, unspecified: Secondary | ICD-10-CM | POA: Diagnosis not present

## 2021-12-22 DIAGNOSIS — I10 Essential (primary) hypertension: Secondary | ICD-10-CM | POA: Diagnosis not present

## 2021-12-22 DIAGNOSIS — I5030 Unspecified diastolic (congestive) heart failure: Secondary | ICD-10-CM | POA: Diagnosis not present

## 2021-12-22 DIAGNOSIS — B351 Tinea unguium: Secondary | ICD-10-CM | POA: Diagnosis not present

## 2021-12-22 DIAGNOSIS — Z1331 Encounter for screening for depression: Secondary | ICD-10-CM | POA: Diagnosis not present

## 2021-12-22 DIAGNOSIS — Z6841 Body Mass Index (BMI) 40.0 and over, adult: Secondary | ICD-10-CM | POA: Diagnosis not present

## 2021-12-22 DIAGNOSIS — Z0000 Encounter for general adult medical examination without abnormal findings: Secondary | ICD-10-CM | POA: Diagnosis not present

## 2021-12-22 DIAGNOSIS — G894 Chronic pain syndrome: Secondary | ICD-10-CM | POA: Diagnosis not present

## 2021-12-22 DIAGNOSIS — R0981 Nasal congestion: Secondary | ICD-10-CM | POA: Diagnosis not present

## 2021-12-22 DIAGNOSIS — J3489 Other specified disorders of nose and nasal sinuses: Secondary | ICD-10-CM | POA: Diagnosis not present

## 2021-12-22 DIAGNOSIS — E109 Type 1 diabetes mellitus without complications: Secondary | ICD-10-CM | POA: Diagnosis not present

## 2021-12-25 ENCOUNTER — Telehealth: Payer: Self-pay | Admitting: Internal Medicine

## 2021-12-25 DIAGNOSIS — G4733 Obstructive sleep apnea (adult) (pediatric): Secondary | ICD-10-CM

## 2021-12-26 NOTE — Telephone Encounter (Signed)
Spoke with the pt  He states that he received his new CPAP and the settings are wrong  The new machine says 15 but it's supposed to be 19  I send order to [facility] to have this corrected

## 2022-01-14 DIAGNOSIS — Z20822 Contact with and (suspected) exposure to covid-19: Secondary | ICD-10-CM | POA: Diagnosis not present

## 2022-01-19 DIAGNOSIS — G894 Chronic pain syndrome: Secondary | ICD-10-CM | POA: Diagnosis not present

## 2022-01-19 DIAGNOSIS — Z6841 Body Mass Index (BMI) 40.0 and over, adult: Secondary | ICD-10-CM | POA: Diagnosis not present

## 2022-01-22 ENCOUNTER — Other Ambulatory Visit: Payer: Self-pay

## 2022-01-22 ENCOUNTER — Ambulatory Visit (INDEPENDENT_AMBULATORY_CARE_PROVIDER_SITE_OTHER): Payer: Medicare Other | Admitting: Internal Medicine

## 2022-01-22 ENCOUNTER — Encounter: Payer: Self-pay | Admitting: Internal Medicine

## 2022-01-22 DIAGNOSIS — J9611 Chronic respiratory failure with hypoxia: Secondary | ICD-10-CM

## 2022-01-22 DIAGNOSIS — G4733 Obstructive sleep apnea (adult) (pediatric): Secondary | ICD-10-CM

## 2022-01-22 NOTE — Progress Notes (Unsigned)
HPI- M never smoker followed for Chronic Hypoxic Respiratory Failure, Allergic rhinitis, OSA, morbid obesity, obesity/ hypoventilation, complicated by back pain, DM, HBP, glaucoma, pulmonary hypertension( Cardiology) ACE level -08/05/11- WNL 24 ACE level 01/15/21- 14 (9-67) NPSG 09/22/14- AHI 72.7/ hr, CPAP to 19, weight 332 lbs PFT 10/21/16-  severe obstruction and restriction with diffusion relatively high for alveolar volume Echocardiogram 10/12/16- BNP 10/09/17- 20.5   WNL O2 qualifying Walk Test 08/07/20-- desat to 87% on rrom air, 94% on 3L HRCT chest 04/08/20- post infectious or inflammatory scarring and volume loss. There are no specific findings to suggest fibrotic interstitial lung disease.  There are numerous prominent, hyperdense, although not pathologically enlarged mediastinal and hilar lymph nodes, unchanged compared to prior examination and most consistent with nodal involvement of sarcoidosis, although occupational inhalational lung disease may also have this appearance. 4. Coronary artery disease. Aortic Atherosclerosis (ICD10-I70.0). 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 Download- compliance 100%, AHI 1.5/ hr Body weight today-300 lbs Covid vax- 3 Moderna ACE level 01/15/21- 14 (9-67) Arrival O2 sat 92% on O2 2L Flu vax today CPAP doing fine.  O2- uses 4L at house with long cord, 2L with portable tank. Feels more easily DOE, little cough or wheeze, no acute events. He had missed notice to update HRCT- we can reschedule.  01/22/22- 71 year old male former light smoker/ retired  [name]) followed for Chronic Hypoxic Respiratory Failure, allergic rhinitis, OSA, Obesity/Hypoventilation, Pulmonary Hypertension(cards), complicated by Morbid Obesity, back pain, DM1/ retinopathy, HBP, Glaucoma, CAD, Aortic Atherosclerosis, Thyroid Nodule,  CPAP 19    O2 2-4 L/[facility]      Requalified for O2 with walk test 03/26/20. Download- compliance 96%, AHI 3.1/ hr Body weight today-290 lbs Covid vax- 3 Moderna Flu vax -had -----Wearing CPAP-doing well Arrival O2 sat today on 2L POC 98%   ROS-see HPI   += positive Constitutional:    weight loss, night sweats, fevers, chills, fatigue, lassitude. HEENT:    headaches, difficulty swallowing, tooth/dental problems, sore throat,       sneezing, itching, ear ache, nasal congestion, post nasal drip, snoring CV:    chest pain, orthopnea, PND, swelling in lower extremities, anasarca,                                   dizziness, palpitations Resp:  + shortness of breath with exertion or at rest.                productive cough,   non-productive cough, coughing up of blood.              change in color of mucus.  wheezing.   Skin:    rash or lesions. GI:  No-   heartburn, indigestion, abdominal pain, nausea, vomiting, diarrhea,                 change in bowel habits, loss of appetite GU: dysuria, change in color of urine, no urgency or frequency.   flank pain. MS:   +joint pain, stiffness, decreased range of motion, back pain. Neuro-     nothing unusual Psych:  change in mood or affect.  depression  or anxiety.   memory loss.  OBJ- Physical Exam     His O2 2L  O2 sat 92%, + morbidly obese, + rolling walker,  General- Alert, Oriented, Affect-appropriate, Distress- none acute Skin- rash-none, lesions- none, excoriation- none Lymphadenopathy- none Head- atraumatic            Eyes- Gross vision intact, PERRLA, conjunctivae and secretions clear            Ears- Hearing, canals-normal            Nose- Clear, no-Septal dev, mucus, polyps, erosion,  perforation             Throat- Mallampati III , mucosa clear , drainage- none, tonsils- atrophic Neck- flexible , trachea midline, no stridor , thyroid nl, carotid no bruit Chest - symmetrical excursion , unlabored           Heart/CV- RRR , no murmur , no gallop  , no rub, nl s1 s2                           - JVD- none , edema- none, stasis changes- none, varices- none           Lung- clear to P&A, wheeze- none, cough- none , dullness-none, rub- none           Chest wall-  Abd-  Br/ Gen/ Rectal- Not done, not indicated Extrem- cyanosis- none, clubbing, none, atrophy- none, strength- nl Neuro- grossly intact to observation

## 2022-01-22 NOTE — Patient Instructions (Signed)
We can continue oxygen as you are doing ? ?Continue CPAP 19 ? ?Please call if we can help ? ? ?

## 2022-02-04 ENCOUNTER — Other Ambulatory Visit: Payer: Self-pay

## 2022-02-04 ENCOUNTER — Telehealth: Payer: Self-pay

## 2022-02-04 NOTE — Telephone Encounter (Signed)
Patient states that the Gemtesa samples are working well, he wants to know if he can get a prescription to continue the med. ?

## 2022-02-05 ENCOUNTER — Other Ambulatory Visit: Payer: Self-pay | Admitting: Urology

## 2022-02-05 DIAGNOSIS — N3941 Urge incontinence: Secondary | ICD-10-CM

## 2022-02-05 MED ORDER — GEMTESA 75 MG PO TABS: 75.0000 mg | ORAL_TABLET | Freq: Every day | ORAL | 11 refills | Status: DC

## 2022-02-06 NOTE — Telephone Encounter (Signed)
Pt aware, samples at front office for pt to pick up on Monday.  ?

## 2022-02-12 DIAGNOSIS — G894 Chronic pain syndrome: Secondary | ICD-10-CM | POA: Diagnosis not present

## 2022-02-12 DIAGNOSIS — Z6841 Body Mass Index (BMI) 40.0 and over, adult: Secondary | ICD-10-CM | POA: Diagnosis not present

## 2022-02-18 NOTE — Assessment & Plan Note (Signed)
He remains dependent on oxygen and continues 2 L.  Here today with his portable tank. ?

## 2022-02-18 NOTE — Assessment & Plan Note (Signed)
Benefits from CPAP with good compliance and control ?Plan-continue CPAP 19 ?

## 2022-02-18 NOTE — Assessment & Plan Note (Signed)
I do not believe he is able to change this significantly on his own.  An external support program might help. ?

## 2022-03-03 DIAGNOSIS — Z20822 Contact with and (suspected) exposure to covid-19: Secondary | ICD-10-CM | POA: Diagnosis not present

## 2022-03-10 ENCOUNTER — Telehealth: Payer: Self-pay

## 2022-03-10 NOTE — Telephone Encounter (Signed)
Pt left a voicemail 03-10-2022 ? ?Needing a medication filled. Pharmacy sent forms to be filled out for refill. ?(Didn't leave name of Rx) ? ?Please advise. ? ?Thanks, ?Helene Kelp ? ?

## 2022-03-11 ENCOUNTER — Telehealth: Payer: Self-pay

## 2022-03-11 ENCOUNTER — Other Ambulatory Visit: Payer: Self-pay

## 2022-03-11 DIAGNOSIS — N3941 Urge incontinence: Secondary | ICD-10-CM

## 2022-03-11 MED ORDER — TROSPIUM CHLORIDE 20 MG PO TABS: 20.0000 mg | ORAL_TABLET | Freq: Two times a day (BID) | ORAL | 3 refills | Status: DC

## 2022-03-11 NOTE — Telephone Encounter (Signed)
Patient requesting refill of trospium.  Last office visit. 10/16/21.  Ok to refill this rx?  Please advise.  ?

## 2022-03-11 NOTE — Telephone Encounter (Signed)
Reordered rx 180 tabs  ?

## 2022-03-16 DIAGNOSIS — I5030 Unspecified diastolic (congestive) heart failure: Secondary | ICD-10-CM | POA: Diagnosis not present

## 2022-03-16 DIAGNOSIS — G8929 Other chronic pain: Secondary | ICD-10-CM | POA: Diagnosis not present

## 2022-03-16 DIAGNOSIS — I10 Essential (primary) hypertension: Secondary | ICD-10-CM | POA: Diagnosis not present

## 2022-03-16 DIAGNOSIS — Z6841 Body Mass Index (BMI) 40.0 and over, adult: Secondary | ICD-10-CM | POA: Diagnosis not present

## 2022-03-16 DIAGNOSIS — R0981 Nasal congestion: Secondary | ICD-10-CM | POA: Diagnosis not present

## 2022-03-16 DIAGNOSIS — J3489 Other specified disorders of nose and nasal sinuses: Secondary | ICD-10-CM | POA: Diagnosis not present

## 2022-03-16 DIAGNOSIS — E109 Type 1 diabetes mellitus without complications: Secondary | ICD-10-CM | POA: Diagnosis not present

## 2022-03-16 DIAGNOSIS — E785 Hyperlipidemia, unspecified: Secondary | ICD-10-CM | POA: Diagnosis not present

## 2022-03-16 DIAGNOSIS — B351 Tinea unguium: Secondary | ICD-10-CM | POA: Diagnosis not present

## 2022-03-21 DIAGNOSIS — Z20822 Contact with and (suspected) exposure to covid-19: Secondary | ICD-10-CM | POA: Diagnosis not present

## 2022-04-16 ENCOUNTER — Encounter: Payer: Self-pay | Admitting: Urology

## 2022-04-16 ENCOUNTER — Ambulatory Visit (INDEPENDENT_AMBULATORY_CARE_PROVIDER_SITE_OTHER): Payer: Medicare Other | Admitting: Urology

## 2022-04-16 VITALS — BP 136/75 | HR 97

## 2022-04-16 DIAGNOSIS — R319 Hematuria, unspecified: Secondary | ICD-10-CM

## 2022-04-16 DIAGNOSIS — N5201 Erectile dysfunction due to arterial insufficiency: Secondary | ICD-10-CM | POA: Diagnosis not present

## 2022-04-16 DIAGNOSIS — K5903 Drug induced constipation: Secondary | ICD-10-CM | POA: Diagnosis not present

## 2022-04-16 DIAGNOSIS — N390 Urinary tract infection, site not specified: Secondary | ICD-10-CM | POA: Diagnosis not present

## 2022-04-16 DIAGNOSIS — Z6841 Body Mass Index (BMI) 40.0 and over, adult: Secondary | ICD-10-CM | POA: Diagnosis not present

## 2022-04-16 DIAGNOSIS — N3941 Urge incontinence: Secondary | ICD-10-CM

## 2022-04-16 DIAGNOSIS — R350 Frequency of micturition: Secondary | ICD-10-CM | POA: Diagnosis not present

## 2022-04-16 DIAGNOSIS — G894 Chronic pain syndrome: Secondary | ICD-10-CM | POA: Diagnosis not present

## 2022-04-16 LAB — URINALYSIS, ROUTINE W REFLEX MICROSCOPIC
Bilirubin, UA: NEGATIVE
Ketones, UA: NEGATIVE
Leukocytes,UA: NEGATIVE
Nitrite, UA: NEGATIVE
Protein,UA: NEGATIVE
Specific Gravity, UA: 1.015 (ref 1.005–1.030)
Urobilinogen, Ur: 0.2 mg/dL (ref 0.2–1.0)
pH, UA: 5.5 (ref 5.0–7.5)

## 2022-04-16 LAB — MICROSCOPIC EXAMINATION
Bacteria, UA: NONE SEEN
Renal Epithel, UA: NONE SEEN /hpf
WBC, UA: NONE SEEN /hpf (ref 0–5)

## 2022-04-16 MED ORDER — MIRABEGRON ER 50 MG PO TB24
50.0000 mg | ORAL_TABLET | Freq: Every day | ORAL | 0 refills | Status: DC
Start: 2022-04-16 — End: 2022-06-10

## 2022-04-16 MED ORDER — MIRABEGRON ER 25 MG PO TB24: 25.0000 mg | ORAL_TABLET | Freq: Every day | ORAL | 0 refills | Status: DC

## 2022-04-16 NOTE — Progress Notes (Unsigned)
Subjective:  1. Urge incontinence   2. Urinary frequency   3. Urinary tract infection with hematuria, site unspecified   4. Erectile dysfunction due to arterial insufficiency      CC: Incontinence   Colton Jackson return today in f/u. He has a several year history of OAB wet with UUI and has completed PTNS induction and was on maintenance q4wks but has been off of that for over a year.  He remains on Trospium but he has had his diuretic increased and he will have increased am frequency as a result.   He had been given Gemtesa samples and took those with the trospium with improvement in his symptoms but he had increased leg swelling.   He didn't benefit from the [place] alone.  He couldn't afford the Gemtesa.   He has rare nocturia.  He has urgency with persistent UUI and wears a pad.. He has had no hematuria or dysuria. He has frequency q2-3hrs. He has no hesitancy but a variable stream with some intermittency. His IPSS is 7.   He had klebsiella on culture at his last visit and was treated.  He has no dysuria or hematuria.        He has ED and has some response to sildenafil.  He has not been active because of COVID concerns.      IPSS     Row Name 04/16/22 1300         International Prostate Symptom Score   How often have you had the sensation of not emptying your bladder? Not at All     How often have you had to urinate less than every two hours? Less than half the time     How often have you found you stopped and started again several times when you urinated? Less than 1 in 5 times     How often have you found it difficult to postpone urination? About half the time     How often have you had a weak urinary stream? Less than 1 in 5 times     How often have you had to strain to start urination? Not at All     How many times did you typically get up at night to urinate? None     Total IPSS Score 7                ROS:  ROS:  A complete review of systems was performed.  All  systems are negative except for pertinent findings as noted.   ROS  Allergies  Allergen Reactions   Covid-19 Mrna Vaccine [employer]) [Covid-19 Mrna Vacc (Moderna)] Other (See Comments)    Joint pain and fatigue   Sulfa Antibiotics Swelling    eyelids    Outpatient Encounter Medications as of 04/16/2022  Medication Sig   acetaminophen (TYLENOL) 500 MG tablet Take 500 mg by mouth every 6 (six) hours as needed for mild pain or moderate pain (In addition to Hydrocodone/Apap as needed for pain).   amLODipine (NORVASC) 10 MG tablet Take 1 tablet by mouth once daily (Patient taking differently: Take 10 mg by mouth daily.)   cloNIDine (CATAPRES) 0.2 MG tablet Take 1 tablet (0.2 mg total) by mouth 3 (three) times daily.   cromolyn (NASALCROM) 5.2 MG/ACT nasal spray Place 1 spray into both nostrils 3 (three) times daily as needed for allergies.    famotidine (PEPCID) 20 MG tablet Take 20 mg by mouth 2 (two) times daily.    furosemide (LASIX)  40 MG tablet Take 40 mg by mouth daily.   glipiZIDE (GLUCOTROL) 10 MG tablet Take 10 mg by mouth 2 (two) times daily.    HYDROcodone-acetaminophen (NORCO) 10-325 MG tablet Take 1 tablet by mouth 3 (three) times daily as needed for moderate pain.    Krill Oil 500 MG CAPS Take 500 mg by mouth daily.    latanoprost (XALATAN) 0.005 % ophthalmic solution INSTILL 1 DROP IN BOTH EYES EVERY DAY AT BEDTIME- NEEDS TO FOLLOWUP WITH GLAUCOMA DOCTOR FOR REFILLS   losartan (COZAAR) 100 MG tablet Take 1 tablet (100 mg total) by mouth daily.   meclizine (ANTIVERT) 25 MG tablet Take 25 mg by mouth 3 (three) times daily as needed for dizziness or nausea (when taking hydrocodone/apap).    meloxicam (MOBIC) 7.5 MG tablet Take 7.5 mg by mouth 2 (two) times daily.   metFORMIN (GLUCOPHAGE-XR) 500 MG 24 hr tablet Take 1,000 mg by mouth 2 (two) times daily.   metoprolol succinate (TOPROL-XL) 50 MG 24 hr tablet Take 1 tablet (50 mg total) by mouth daily. (Patient taking differently: Take 50  mg by mouth daily. Pt states 2070m)   mirabegron ER (MYRBETRIQ) 25 MG TB24 tablet Take 1 tablet (25 mg total) by mouth daily.   mirabegron ER (MYRBETRIQ) 50 MG TB24 tablet Take 1 tablet (50 mg total) by mouth daily.   mupirocin ointment (BACTROBAN) 2 % 1 application 2 (two) times daily.   OXYGEN Inhale 2 L/hr into the lungs.   psyllium (METAMUCIL) 58.6 % powder Take 1 packet by mouth 2 (two) times daily.    terbinafine (LAMISIL) 250 MG tablet Take 500 mg by mouth daily.   trospium (SANCTURA) 20 MG tablet Take 1 tablet (20 mg total) by mouth 2 (two) times daily.   Vitamin D, Ergocalciferol, (DRISDOL) 1.25 MG (50000 UNIT) CAPS capsule Take by mouth.   [DISCONTINUED] Vibegron (GEMTESA) 75 MG TABS Take 75 mg by mouth daily.   No facility-administered encounter medications on file as of 04/16/2022.    Past Medical History:  Diagnosis Date   Allergic rhinitis    Aneurysm artery, neck (HCC)    Dr. YAnnamaria Bootspulmonologist   Degenerative disk disease    Essential hypertension    GERD (gastroesophageal reflux disease)    H/O cardiovascular stress test 02/02/11   EF 59% - Normal stress test   Hearing deficit, right    Knee pain, bilateral    Lower back pain    Morbid obesity (HCC)    OSA (obstructive sleep apnea)    On CPAP   Thyroid nodule    Type 2 diabetes mellitus ([place]    Type 2 diabetes mellitus without complication ([place] 82/42/6834  Qualifier: Diagnosis of  By: YAnnamaria BootsMD, Clinton D     Past Surgical History:  Procedure Laterality Date   APPENDECTOMY     CARDIAC CATHETERIZATION     Cataract surgery Bilateral    COLONOSCOPY WITH PROPOFOL N/A 08/01/2019   Procedure: COLONOSCOPY WITH PROPOFOL;  Surgeon: JAviva Signs MD;  [facility];  Service: General;  Laterality: N/A;   KNEE ARTHROSCOPY Bilateral    NASAL SEPTOPLASTY W/ TURBINOPLASTY     RIGHT/LEFT HEART CATH AND CORONARY ANGIOGRAPHY N/A 06/23/2018   Procedure: RIGHT/LEFT HEART CATH AND CORONARY ANGIOGRAPHY;  Surgeon:  MLarey Dresser MD;  [facility];  Service: Cardiovascular;  Laterality: N/A;    Social History   Socioeconomic History   Marital status: Widowed    Spouse name: Not  on file   Number of children: Not on file   Years of education: Not on file   Highest education level: Not on file  Occupational History   Occupation: former [name], tow truck service  Tobacco Use   Smoking status: Former    Types: Cigarettes    Quit date: 1971    Years since quitting: 52.4   Smokeless tobacco: Never   Tobacco comments:    smoked when he was a teenager  Scientific laboratory technician Use: Never used  Substance and Sexual Activity   Alcohol use: No   Drug use: No   Sexual activity: Not on file  Other Topics Concern   Not on file  Social History Narrative   Not on file   Social Determinants of Health   Financial Resource Strain: Not on file  Food Insecurity: Not on file  Transportation Needs: Not on file  Physical Activity: Not on file  Stress: Not on file  Social Connections: Not on file  Intimate Partner Violence: Not on file    Family History  Problem Relation Age of Onset   Lung disease Father    Stroke Father 57   Diabetes Father    Diabetes Maternal Grandfather    Heart disease Paternal Grandmother    Stroke Paternal Grandfather 39   Diabetes Paternal Grandfather    Diabetes Mother    Cancer Mother    Stroke Mother 45   Diabetes Sister    Diabetes Brother 36       Objective: Vitals:   04/16/22 1321  BP: 136/75  Pulse: 97      Physical Exam  Lab Results:  Results for orders placed or performed in visit on 04/16/22 (from the past 24 hour(s))  Urinalysis, Routine w reflex microscopic     Status: Abnormal   Collection Time: 04/16/22  3:16 PM  Result Value Ref Range   Specific Gravity, UA 1.015 1.005 - 1.030   pH, UA 5.5 5.0 - 7.5   Color, UA Yellow Yellow   Appearance Ur Clear Clear   Leukocytes,UA Negative Negative   Protein,UA Negative  Negative/Trace   Glucose, UA Trace (A) Negative   Ketones, UA Negative Negative   RBC, UA Trace (A) Negative   Bilirubin, UA Negative Negative   Urobilinogen, Ur 0.2 0.2 - 1.0 mg/dL   Nitrite, UA Negative Negative   Microscopic Examination See below:    Narrative   Performed at:  [address] [facility] [address], [place], [place]  [id number] Lab Director: Mina Marble MT, Phone:  [phone]  Microscopic Examination     Status: None   Collection Time: 04/16/22  3:16 PM   Urine  Result Value Ref Range   WBC, UA None seen 0 - 5 /hpf   RBC 0-2 0 - 2 /hpf   Epithelial Cells (non renal) 0-10 0 - 10 /hpf   Renal Epithel, UA None seen None seen /hpf   Mucus, UA Present Not Estab.   Bacteria, UA None seen None seen/Few   Narrative   Performed at:  [facility] [address], [place], [place]  [id number] Lab Director: [place], Phone:  [phone]     BMET No results for input(s): NA, K, CL, CO2, GLUCOSE, BUN, CREATININE, CALCIUM in the last 72 hours. PSA No results found for: PSA No results found for: TESTOSTERONE  UA is clear.   Studies/Results: No results found. I have reviewed his records and labs in  [facility].   Assessment & Plan: Urge incontinence remains which is partially controlled with Trospium.  He couldn't afford the Gemtesa so I will try Myrbetriq since his BP is controlled.   He was given 56m and 583msamples.  He will try the 2516mor at least two weeks.  Side effects reviewed.   UTI.  He will try to get a UA today.     ED.   He has not been active but reports a response to sildenafil.    Meds ordered this encounter  Medications   mirabegron ER (MYRBETRIQ) 25 MG TB24 tablet    Sig: Take 1 tablet (25 mg total) by mouth daily.    Dispense:  28 tablet    Refill:  0   mirabegron ER (MYRBETRIQ) 50 MG TB24 tablet    Sig: Take 1 tablet (50 mg total) by mouth daily.    Dispense:  28 tablet    Refill:  0      Orders Placed This  Encounter  Procedures   Microscopic Examination   Urinalysis, Routine w reflex microscopic      Return in about 4 weeks (around 05/14/2022) for for PVR and OV and could see JilSharee Pimple I am full. .   CC: GolSharilyn SitesD      JohIrine Seal2/2023 Patient ID: Colton Cecilmale   DOB: [birth year] 62o.   MRN: [id number]

## 2022-04-22 ENCOUNTER — Ambulatory Visit (INDEPENDENT_AMBULATORY_CARE_PROVIDER_SITE_OTHER): Payer: Medicare Other | Admitting: Ophthalmology

## 2022-04-22 ENCOUNTER — Encounter (INDEPENDENT_AMBULATORY_CARE_PROVIDER_SITE_OTHER): Payer: Self-pay | Admitting: Ophthalmology

## 2022-04-22 DIAGNOSIS — H401131 Primary open-angle glaucoma, bilateral, mild stage: Secondary | ICD-10-CM | POA: Diagnosis not present

## 2022-04-22 DIAGNOSIS — E113491 Type 2 diabetes mellitus with severe nonproliferative diabetic retinopathy without macular edema, right eye: Secondary | ICD-10-CM

## 2022-04-22 DIAGNOSIS — E113492 Type 2 diabetes mellitus with severe nonproliferative diabetic retinopathy without macular edema, left eye: Secondary | ICD-10-CM | POA: Diagnosis not present

## 2022-04-22 NOTE — Assessment & Plan Note (Signed)
No active maculopathy we will continue to monitor no progression of disease

## 2022-04-22 NOTE — Assessment & Plan Note (Signed)
Stable OU

## 2022-04-22 NOTE — Assessment & Plan Note (Signed)
Stable OD, no signs of active maculopathy we will continue to monitor and observe

## 2022-04-22 NOTE — Progress Notes (Signed)
04/22/2022     CHIEF COMPLAINT Patient presents for  Chief Complaint  Patient presents with   Diabetic Retinopathy with Macular Edema      HISTORY OF PRESENT ILLNESS: Colton Jackson. is a 71 y.o. male who presents to the clinic today for:   HPI   6 MOS for DILATE OU, OCT. Pt stated,"I can see well but I have a lot of medicines so when I am looking at them, its a little harder to see. I can't see smaller print like I used to." Pt denies floaters and FOL. Pt is still taking Latanoprost- 1 drop into both eyes at bedtime.    Last edited by Silvestre Moment on 04/22/2022 10:46 AM.      Referring physician: Sharilyn Sites, MD [address] [place],  [place] [address]  HISTORICAL INFORMATION:   Selected notes from the MEDICAL RECORD NUMBER       CURRENT MEDICATIONS: Current Outpatient Medications (Ophthalmic Drugs)  Medication Sig   latanoprost (XALATAN) 0.005 % ophthalmic solution INSTILL 1 DROP IN BOTH EYES EVERY DAY AT BEDTIME- NEEDS TO FOLLOWUP WITH GLAUCOMA DOCTOR FOR REFILLS   No current facility-administered medications for this visit. (Ophthalmic Drugs)   Current Outpatient Medications (Other)  Medication Sig   acetaminophen (TYLENOL) 500 MG tablet Take 500 mg by mouth every 6 (six) hours as needed for mild pain or moderate pain (In addition to Hydrocodone/Apap as needed for pain).   amLODipine (NORVASC) 10 MG tablet Take 1 tablet by mouth once daily (Patient taking differently: Take 10 mg by mouth daily.)   cloNIDine (CATAPRES) 0.2 MG tablet Take 1 tablet (0.2 mg total) by mouth 3 (three) times daily.   cromolyn (NASALCROM) 5.2 MG/ACT nasal spray Place 1 spray into both nostrils 3 (three) times daily as needed for allergies.    famotidine (PEPCID) 20 MG tablet Take 20 mg by mouth 2 (two) times daily.    furosemide (LASIX) 40 MG tablet Take 40 mg by mouth daily.   glipiZIDE (GLUCOTROL) 10 MG tablet Take 10 mg by mouth 2 (two) times daily.    HYDROcodone-acetaminophen  (NORCO) 10-325 MG tablet Take 1 tablet by mouth 3 (three) times daily as needed for moderate pain.    Krill Oil 500 MG CAPS Take 500 mg by mouth daily.    losartan (COZAAR) 100 MG tablet Take 1 tablet (100 mg total) by mouth daily.   meclizine (ANTIVERT) 25 MG tablet Take 25 mg by mouth 3 (three) times daily as needed for dizziness or nausea (when taking hydrocodone/apap).    meloxicam (MOBIC) 7.5 MG tablet Take 7.5 mg by mouth 2 (two) times daily.   metFORMIN (GLUCOPHAGE-XR) 500 MG 24 hr tablet Take 1,000 mg by mouth 2 (two) times daily.   metoprolol succinate (TOPROL-XL) 50 MG 24 hr tablet Take 1 tablet (50 mg total) by mouth daily. (Patient taking differently: Take 50 mg by mouth daily. Pt states 2070m)   mirabegron ER (MYRBETRIQ) 25 MG TB24 tablet Take 1 tablet (25 mg total) by mouth daily.   mirabegron ER (MYRBETRIQ) 50 MG TB24 tablet Take 1 tablet (50 mg total) by mouth daily.   mupirocin ointment (BACTROBAN) 2 % 1 application 2 (two) times daily.   OXYGEN Inhale 2 L/hr into the lungs.   psyllium (METAMUCIL) 58.6 % powder Take 1 packet by mouth 2 (two) times daily.    terbinafine (LAMISIL) 250 MG tablet Take 500 mg by mouth daily.   trospium (SANCTURA) 20 MG tablet Take 1  tablet (20 mg total) by mouth 2 (two) times daily.   Vitamin D, Ergocalciferol, (DRISDOL) 1.25 MG (50000 UNIT) CAPS capsule Take by mouth.   No current facility-administered medications for this visit. (Other)      REVIEW OF SYSTEMS: ROS   Negative for: Constitutional, Gastrointestinal, Neurological, Skin, Genitourinary, Musculoskeletal, HENT, Endocrine, Cardiovascular, Eyes, Respiratory, Psychiatric, Allergic/Imm, Heme/Lymph Last edited by Silvestre Moment on 04/22/2022 10:46 AM.       ALLERGIES Allergies  Allergen Reactions   Covid-19 Mrna Vaccine ([place]) [Covid-19 Mrna Vacc (Moderna)] Other (See Comments)    Joint pain and fatigue   Sulfa Antibiotics Swelling    eyelids    PAST MEDICAL HISTORY Past Medical  History:  Diagnosis Date   Allergic rhinitis    Aneurysm artery, neck (HCC)    [name] pulmonologist   Degenerative disk disease    Essential hypertension    GERD (gastroesophageal reflux disease)    H/O cardiovascular stress test 02/02/11   EF 59% - Normal stress test   Hearing deficit, right    Knee pain, bilateral    Lower back pain    Morbid obesity (HCC)    OSA (obstructive sleep apnea)    On CPAP   Thyroid nodule    Type 2 diabetes mellitus ([place])    Type 2 diabetes mellitus without complication ([place]) 0/88/1103   Qualifier: Diagnosis of  By: Annamaria Boots MD, Clinton D    Past Surgical History:  Procedure Laterality Date   APPENDECTOMY     CARDIAC CATHETERIZATION     Cataract surgery Bilateral    COLONOSCOPY WITH PROPOFOL N/A 08/01/2019   Procedure: COLONOSCOPY WITH PROPOFOL;  Surgeon: Aviva Signs, MD;  [facility];  Service: General;  Laterality: N/A;   KNEE ARTHROSCOPY Bilateral    NASAL SEPTOPLASTY W/ TURBINOPLASTY     RIGHT/LEFT HEART CATH AND CORONARY ANGIOGRAPHY N/A 06/23/2018   Procedure: RIGHT/LEFT HEART CATH AND CORONARY ANGIOGRAPHY;  Surgeon: Larey Dresser, MD;  Location: [facility];  Service: Cardiovascular;  Laterality: N/A;    FAMILY HISTORY Family History  Problem Relation Age of Onset   Lung disease Father    Stroke Father 79   Diabetes Father    Diabetes Maternal Grandfather    Heart disease Paternal Grandmother    Stroke Paternal Grandfather 38   Diabetes Paternal Grandfather    Diabetes Mother    Cancer Mother    Stroke Mother 69   Diabetes Sister    Diabetes Brother 53    SOCIAL HISTORY Social History   Tobacco Use   Smoking status: Former    Types: Cigarettes    Quit date: 1971    Years since quitting: 52.4   Smokeless tobacco: Never   Tobacco comments:    smoked when he was a teenager  Scientific laboratory technician Use: Never used  Substance Use Topics   Alcohol use: No   Drug use: No         OPHTHALMIC  EXAM:  Base Eye Exam     Visual Acuity (ETDRS)       Right Left   Dist  20/20 20/20 -1         Tonometry (Tonopen, 10:50 AM)       Right Left   Pressure 13 20         Pupils       Pupils APD   Right PERRL None   Left PERRL None  Visual Fields       Left Right    Full Full         Extraocular Movement       Right Left    Full Full         Neuro/Psych     Oriented x3: Yes   Mood/Affect: Normal         Dilation     Both eyes: 1.0% Mydriacyl, 2.5% Phenylephrine @ 10:50 AM           Slit Lamp and Fundus Exam     External Exam       Right Left   External Normal Normal         Slit Lamp Exam       Right Left   Lids/Lashes Normal Normal   Conjunctiva/Sclera White and quiet White and quiet   Cornea Clear Clear   Anterior Chamber Deep and quiet Deep and quiet   Iris Round and reactive Round and reactive   Lens Posterior chamber intraocular lens Posterior chamber intraocular lens   Anterior Vitreous Normal Normal         Fundus Exam       Right Left   Posterior Vitreous Normal Normal   Disc Normal Normal   C/D Ratio 0.7 0.7   Macula Microaneurysms, no clinically significant macular edema, no exudates, no macular thickening Microaneurysms, no clinically significant macular edema, no exudates, no macular thickening   Vessels NPDR-Severe NPDR-Severe   Periphery Normal Normal            IMAGING AND PROCEDURES  Imaging and Procedures for 04/22/22  OCT, Retina - OU - Both Eyes       Right Eye Quality was good. Scan locations included subfoveal. Central Foveal Thickness: 253. Progression has been stable.   Left Eye Quality was good. Central Foveal Thickness: 253. Progression has been stable. Findings include vitreomacular adhesion .   Notes No foveal traction.  No active maculopathy OU.             ASSESSMENT/PLAN:  Severe nonproliferative diabetic retinopathy of right eye (HCC) Stable OD, no signs  of active maculopathy we will continue to monitor and observe  Severe nonproliferative diabetic retinopathy of left eye (HCC) No active maculopathy we will continue to monitor no progression of disease  Primary open angle glaucoma of both eyes, mild stage Stable OU     ICD-10-CM   1. Severe nonproliferative diabetic retinopathy of left eye associated with type 2 diabetes mellitus, macular edema presence unspecified (HCC)  M35.3614 OCT, Retina - OU - Both Eyes    2. Severe nonproliferative diabetic retinopathy of right eye without macular edema associated with type 2 diabetes mellitus ([place])  E11.3491     3. Primary open angle glaucoma of both eyes, mild stage  H40.1131       1.  Stable acuity and no signs of active maculopathy OU.  Will observe  2.  Excellent blood sugar control reported  3.  Ophthalmic Meds Ordered this visit:  No orders of the defined types were placed in this encounter.      Return in about 9 months (around 01/21/2023) for DILATE OU, COLOR FP, OCT.  There are no Patient Instructions on file for this visit.   Explained the diagnoses, plan, and follow up with the patient and they expressed understanding.  Patient expressed understanding of the importance of proper follow up care.   Clent Demark Tynasia Mccaul M.D. Diseases & Surgery of the  Retina and Vitreous Retina & Diabetic [place] 04/22/22     Abbreviations: M myopia (nearsighted); A astigmatism; H hyperopia (farsighted); P presbyopia; Mrx spectacle prescription;  CTL contact lenses; OD right eye; OS left eye; OU both eyes  XT exotropia; ET esotropia; PEK punctate epithelial keratitis; PEE punctate epithelial erosions; DES dry eye syndrome; MGD meibomian gland dysfunction; ATs artificial tears; PFAT's preservative free artificial tears; [employer] nuclear sclerotic cataract; PSC posterior subcapsular cataract; ERM epi-retinal membrane; PVD posterior vitreous detachment; RD retinal detachment; DM diabetes mellitus; DR  diabetic retinopathy; NPDR non-proliferative diabetic retinopathy; PDR proliferative diabetic retinopathy; CSME clinically significant macular edema; DME diabetic macular edema; dbh dot blot hemorrhages; CWS cotton wool spot; POAG primary open angle glaucoma; C/D cup-to-disc ratio; HVF humphrey visual field; GVF goldmann visual field; OCT optical coherence tomography; IOP intraocular pressure; BRVO Branch retinal vein occlusion; CRVO central retinal vein occlusion; CRAO central retinal artery occlusion; BRAO branch retinal artery occlusion; RT retinal tear; SB scleral buckle; PPV pars plana vitrectomy; VH Vitreous hemorrhage; PRP panretinal laser photocoagulation; IVK intravitreal kenalog; VMT vitreomacular traction; MH Macular hole;  NVD neovascularization of the disc; NVE neovascularization elsewhere; AREDS age related eye disease study; ARMD age related macular degeneration; POAG primary open angle glaucoma; EBMD epithelial/anterior basement membrane dystrophy; ACIOL anterior chamber intraocular lens; IOL intraocular lens; PCIOL posterior chamber intraocular lens; Phaco/IOL phacoemulsification with intraocular lens placement; [place] photorefractive keratectomy; LASIK laser assisted in situ keratomileusis; HTN hypertension; DM diabetes mellitus; COPD chronic obstructive pulmonary disease

## 2022-05-14 ENCOUNTER — Ambulatory Visit: Payer: Medicare Other | Admitting: Physician Assistant

## 2022-05-18 DIAGNOSIS — G894 Chronic pain syndrome: Secondary | ICD-10-CM | POA: Diagnosis not present

## 2022-06-03 ENCOUNTER — Encounter (HOSPITAL_COMMUNITY): Payer: Self-pay | Admitting: Emergency Medicine

## 2022-06-03 ENCOUNTER — Inpatient Hospital Stay (HOSPITAL_COMMUNITY)
Admission: EM | Admit: 2022-06-03 | Discharge: 2022-06-10 | DRG: 388 | Disposition: A | Discharge status: Home Health | Payer: Medicare Other | Attending: Internal Medicine | Admitting: Internal Medicine

## 2022-06-03 ENCOUNTER — Other Ambulatory Visit: Payer: Self-pay

## 2022-06-03 ENCOUNTER — Emergency Department (HOSPITAL_COMMUNITY): Payer: Medicare Other

## 2022-06-03 DIAGNOSIS — J9621 Acute and chronic respiratory failure with hypoxia: Secondary | ICD-10-CM | POA: Diagnosis not present

## 2022-06-03 DIAGNOSIS — I10 Essential (primary) hypertension: Secondary | ICD-10-CM | POA: Diagnosis not present

## 2022-06-03 DIAGNOSIS — E662 Morbid (severe) obesity with alveolar hypoventilation: Secondary | ICD-10-CM | POA: Diagnosis not present

## 2022-06-03 DIAGNOSIS — D72829 Elevated white blood cell count, unspecified: Secondary | ICD-10-CM | POA: Diagnosis not present

## 2022-06-03 DIAGNOSIS — R000 Tachycardia, unspecified: Secondary | ICD-10-CM | POA: Diagnosis not present

## 2022-06-03 DIAGNOSIS — E785 Hyperlipidemia, unspecified: Secondary | ICD-10-CM | POA: Diagnosis present

## 2022-06-03 DIAGNOSIS — J9622 Acute and chronic respiratory failure with hypercapnia: Secondary | ICD-10-CM | POA: Diagnosis not present

## 2022-06-03 DIAGNOSIS — N179 Acute kidney failure, unspecified: Secondary | ICD-10-CM | POA: Diagnosis not present

## 2022-06-03 DIAGNOSIS — K56609 Unspecified intestinal obstruction, unspecified as to partial versus complete obstruction: Secondary | ICD-10-CM | POA: Diagnosis present

## 2022-06-03 DIAGNOSIS — K760 Fatty (change of) liver, not elsewhere classified: Secondary | ICD-10-CM | POA: Diagnosis not present

## 2022-06-03 DIAGNOSIS — E041 Nontoxic single thyroid nodule: Secondary | ICD-10-CM | POA: Diagnosis present

## 2022-06-03 DIAGNOSIS — I272 Pulmonary hypertension, unspecified: Secondary | ICD-10-CM | POA: Diagnosis present

## 2022-06-03 DIAGNOSIS — G8929 Other chronic pain: Secondary | ICD-10-CM | POA: Diagnosis present

## 2022-06-03 DIAGNOSIS — K7689 Other specified diseases of liver: Secondary | ICD-10-CM | POA: Diagnosis not present

## 2022-06-03 DIAGNOSIS — Z6841 Body Mass Index (BMI) 40.0 and over, adult: Secondary | ICD-10-CM

## 2022-06-03 DIAGNOSIS — Z833 Family history of diabetes mellitus: Secondary | ICD-10-CM

## 2022-06-03 DIAGNOSIS — H9191 Unspecified hearing loss, right ear: Secondary | ICD-10-CM | POA: Diagnosis present

## 2022-06-03 DIAGNOSIS — R7989 Other specified abnormal findings of blood chemistry: Secondary | ICD-10-CM

## 2022-06-03 DIAGNOSIS — E1169 Type 2 diabetes mellitus with other specified complication: Secondary | ICD-10-CM

## 2022-06-03 DIAGNOSIS — I509 Heart failure, unspecified: Secondary | ICD-10-CM | POA: Diagnosis present

## 2022-06-03 DIAGNOSIS — Z01810 Encounter for preprocedural cardiovascular examination: Secondary | ICD-10-CM | POA: Diagnosis not present

## 2022-06-03 DIAGNOSIS — Z9981 Dependence on supplemental oxygen: Secondary | ICD-10-CM | POA: Diagnosis not present

## 2022-06-03 DIAGNOSIS — K219 Gastro-esophageal reflux disease without esophagitis: Secondary | ICD-10-CM | POA: Diagnosis present

## 2022-06-03 DIAGNOSIS — Z79899 Other long term (current) drug therapy: Secondary | ICD-10-CM

## 2022-06-03 DIAGNOSIS — J449 Chronic obstructive pulmonary disease, unspecified: Secondary | ICD-10-CM | POA: Diagnosis not present

## 2022-06-03 DIAGNOSIS — E119 Type 2 diabetes mellitus without complications: Secondary | ICD-10-CM | POA: Diagnosis present

## 2022-06-03 DIAGNOSIS — I2723 Pulmonary hypertension due to lung diseases and hypoxia: Secondary | ICD-10-CM | POA: Diagnosis present

## 2022-06-03 DIAGNOSIS — E669 Obesity, unspecified: Secondary | ICD-10-CM | POA: Diagnosis not present

## 2022-06-03 DIAGNOSIS — K3189 Other diseases of stomach and duodenum: Secondary | ICD-10-CM | POA: Diagnosis not present

## 2022-06-03 DIAGNOSIS — R110 Nausea: Secondary | ICD-10-CM | POA: Diagnosis not present

## 2022-06-03 DIAGNOSIS — Z7984 Long term (current) use of oral hypoglycemic drugs: Secondary | ICD-10-CM

## 2022-06-03 DIAGNOSIS — I110 Hypertensive heart disease with heart failure: Secondary | ICD-10-CM | POA: Diagnosis not present

## 2022-06-03 DIAGNOSIS — E876 Hypokalemia: Secondary | ICD-10-CM | POA: Diagnosis not present

## 2022-06-03 DIAGNOSIS — R0902 Hypoxemia: Secondary | ICD-10-CM | POA: Diagnosis not present

## 2022-06-03 DIAGNOSIS — R1111 Vomiting without nausea: Secondary | ICD-10-CM | POA: Diagnosis not present

## 2022-06-03 DIAGNOSIS — Z882 Allergy status to sulfonamides status: Secondary | ICD-10-CM

## 2022-06-03 DIAGNOSIS — K828 Other specified diseases of gallbladder: Secondary | ICD-10-CM | POA: Diagnosis not present

## 2022-06-03 DIAGNOSIS — R5381 Other malaise: Secondary | ICD-10-CM | POA: Diagnosis present

## 2022-06-03 DIAGNOSIS — I2510 Atherosclerotic heart disease of native coronary artery without angina pectoris: Secondary | ICD-10-CM | POA: Diagnosis present

## 2022-06-03 DIAGNOSIS — R0609 Other forms of dyspnea: Secondary | ICD-10-CM | POA: Diagnosis not present

## 2022-06-03 DIAGNOSIS — J9811 Atelectasis: Secondary | ICD-10-CM | POA: Diagnosis not present

## 2022-06-03 DIAGNOSIS — K565 Intestinal adhesions [bands], unspecified as to partial versus complete obstruction: Secondary | ICD-10-CM | POA: Diagnosis not present

## 2022-06-03 DIAGNOSIS — K6389 Other specified diseases of intestine: Secondary | ICD-10-CM | POA: Diagnosis not present

## 2022-06-03 DIAGNOSIS — Z87891 Personal history of nicotine dependence: Secondary | ICD-10-CM

## 2022-06-03 DIAGNOSIS — Z4682 Encounter for fitting and adjustment of non-vascular catheter: Secondary | ICD-10-CM | POA: Diagnosis not present

## 2022-06-03 DIAGNOSIS — Z9049 Acquired absence of other specified parts of digestive tract: Secondary | ICD-10-CM

## 2022-06-03 DIAGNOSIS — N200 Calculus of kidney: Secondary | ICD-10-CM | POA: Diagnosis present

## 2022-06-03 DIAGNOSIS — J309 Allergic rhinitis, unspecified: Secondary | ICD-10-CM | POA: Diagnosis present

## 2022-06-03 DIAGNOSIS — Z887 Allergy status to serum and vaccine status: Secondary | ICD-10-CM

## 2022-06-03 LAB — CBC WITH DIFFERENTIAL/PLATELET
Abs Immature Granulocytes: 0.09 10*3/uL — ABNORMAL HIGH (ref 0.00–0.07)
Basophils Absolute: 0 10*3/uL (ref 0.0–0.1)
Basophils Relative: 0 %
Eosinophils Absolute: 0 10*3/uL (ref 0.0–0.5)
Eosinophils Relative: 0 %
HCT: 39.6 % (ref 39.0–52.0)
Hemoglobin: 13.1 g/dL (ref 13.0–17.0)
Immature Granulocytes: 1 %
Lymphocytes Relative: 3 %
Lymphs Abs: 0.5 10*3/uL — ABNORMAL LOW (ref 0.7–4.0)
MCH: 31.8 pg (ref 26.0–34.0)
MCHC: 33.1 g/dL (ref 30.0–36.0)
MCV: 96.1 fL (ref 80.0–100.0)
Monocytes Absolute: 0.8 10*3/uL (ref 0.1–1.0)
Monocytes Relative: 5 %
Neutro Abs: 13.2 10*3/uL — ABNORMAL HIGH (ref 1.7–7.7)
Neutrophils Relative %: 91 %
Platelets: 275 10*3/uL (ref 150–400)
RBC: 4.12 MIL/uL — ABNORMAL LOW (ref 4.22–5.81)
RDW: 12.6 % (ref 11.5–15.5)
WBC: 14.5 10*3/uL — ABNORMAL HIGH (ref 4.0–10.5)
nRBC: 0 % (ref 0.0–0.2)

## 2022-06-03 LAB — COMPREHENSIVE METABOLIC PANEL
ALT: 14 U/L (ref 0–44)
AST: 25 U/L (ref 15–41)
Albumin: 4.5 g/dL (ref 3.5–5.0)
Alkaline Phosphatase: 59 U/L (ref 38–126)
Anion gap: 15 (ref 5–15)
BUN: 19 mg/dL (ref 8–23)
CO2: 33 mmol/L — ABNORMAL HIGH (ref 22–32)
Calcium: 9 mg/dL (ref 8.9–10.3)
Chloride: 94 mmol/L — ABNORMAL LOW (ref 98–111)
Creatinine, Ser: 1.27 mg/dL — ABNORMAL HIGH (ref 0.61–1.24)
GFR, Estimated: >60 mL/min (ref >60)
Glucose, Bld: 183 mg/dL — ABNORMAL HIGH (ref 70–99)
Potassium: 3.4 mmol/L — ABNORMAL LOW (ref 3.5–5.1)
Sodium: 142 mmol/L (ref 135–145)
Total Bilirubin: 0.8 mg/dL (ref 0.3–1.2)
Total Protein: 8.6 g/dL — ABNORMAL HIGH (ref 6.5–8.1)

## 2022-06-03 MED ORDER — BISACODYL 10 MG RE SUPP
10.0000 mg | Freq: Once | RECTAL | Status: AC
  Administered 2022-06-04: 10 mg via RECTAL
  Filled 2022-06-03: qty 1

## 2022-06-03 MED ORDER — SODIUM CHLORIDE 0.9 % IV BOLUS
1000.0000 mL | Freq: Once | INTRAVENOUS | Status: AC
Start: 2022-06-03 — End: 2022-06-03
  Administered 2022-06-03: 1000 mL via INTRAVENOUS

## 2022-06-03 MED ORDER — IOHEXOL 300 MG/ML  SOLN
100.0000 mL | Freq: Once | INTRAMUSCULAR | Status: AC | PRN
  Administered 2022-06-03: 100 mL via INTRAVENOUS

## 2022-06-03 MED ORDER — ONDANSETRON HCL 4 MG/2ML IJ SOLN
4.0000 mg | Freq: Once | INTRAMUSCULAR | Status: AC
  Administered 2022-06-03: 4 mg via INTRAVENOUS
  Filled 2022-06-03: qty 2

## 2022-06-03 NOTE — ED Notes (Signed)
Pt is sitting on the side of the bed with a stool to support his legs. Pt states "I cannot lay down because it is harder for me to breathe, I use a CPAP at home at bedtime when I do lay down"

## 2022-06-03 NOTE — ED Notes (Signed)
Moved pt to a recliner for pt comfort.

## 2022-06-03 NOTE — ED Notes (Signed)
Spoke to lab regarding pt's bloodwork. Lab had troubles getting pt's blood. Will send another tech to get pt's blood

## 2022-06-03 NOTE — ED Triage Notes (Signed)
Pt arrived via [facility] c/o N/V started this morning due to pt stating "indigestion". Pt also has not had a bowel movement in 7 days. Pt on oxygen chronically  4 L. Hx of CHF

## 2022-06-03 NOTE — ED Notes (Signed)
Pt complaining of emesis and weakness at this time. Pt wants to know when they can eat/drink. States they "feel weak". Pt made aware of being NPO until CT scan. EDP made aware

## 2022-06-03 NOTE — ED Provider Notes (Signed)
[facility] EMERGENCY DEPARTMENT Provider Note   CSN: [id number] Arrival date & time: 06/03/22  1611     History  Chief Complaint  Patient presents with   Emesis    Colton Bob. is a 71 y.o. male.  HPI     Pt comes in with cc of vomiting. Pt has hx of diabetes, sleep apnea, chronic pain on narcotic medicine for symptom management and previous history of appendectomy.  Patient comes in with chief complaint of nausea and vomiting that started today.  He indicates that he has had 20+ episodes of emesis, mostly small-volume.  Initially the emesis was clear, now there is some bilious material.  He has generalized abdominal tenderness.  His last bowel movement was a week ago.  He thinks he is passing flatus.  No history of small bowel obstruction.  Review of systems negative for fevers and chills.   Home Medications Prior to Admission medications   Medication Sig Start Date End Date Taking? Authorizing Provider  acetaminophen (TYLENOL) 500 MG tablet Take 500 mg by mouth 3 (three) times daily.   Yes [provider]  amLODipine (NORVASC) 10 MG tablet Take 1 tablet by mouth once daily Patient taking differently: Take 10 mg by mouth daily. 01/27/19  Yes Larey Dresser, MD  Cholecalciferol (VITAMIN D3) 10 MCG (400 UNIT) CAPS Take 1 capsule by mouth daily.   Yes [provider]  cloNIDine (CATAPRES) 0.2 MG tablet Take 1 tablet (0.2 mg total) by mouth 3 (three) times daily. 01/18/19  Yes Emokpae, Courage, MD  cromolyn (NASALCROM) 5.2 MG/ACT nasal spray Place 1 spray into both nostrils 3 (three) times daily as needed for allergies.    Yes [provider]  famotidine (PEPCID) 20 MG tablet Take 20 mg by mouth 2 (two) times daily.    Yes [provider]  furosemide (LASIX) 40 MG tablet Take 40 mg by mouth daily. 04/03/22  Yes [provider]  glipiZIDE (GLUCOTROL) 10 MG tablet Take 10 mg by mouth 2 (two) times daily.  06/26/19  Yes [provider]  HYDROcodone-acetaminophen (NORCO) 10-325 MG tablet Take 1 tablet by mouth 3 (three) times daily as needed for moderate pain.    Yes [provider]  Javier Docker Oil 500 MG CAPS Take 500 mg by mouth daily.    Yes [provider]  latanoprost (XALATAN) 0.005 % ophthalmic solution INSTILL 1 DROP IN BOTH EYES EVERY DAY AT BEDTIME- NEEDS TO FOLLOWUP WITH GLAUCOMA DOCTOR FOR REFILLS Patient taking differently: Place 1 drop into both eyes at bedtime. 03/24/21  Yes Rankin, Clent Demark, MD  losartan (COZAAR) 100 MG tablet Take 1 tablet (100 mg total) by mouth daily. 01/19/19  Yes Roxan Hockey, MD  meclizine (ANTIVERT) 25 MG tablet Take 25 mg by mouth 3 (three) times daily as needed for dizziness or nausea (when taking hydrocodone/apap).    Yes [provider]  meloxicam (MOBIC) 7.5 MG tablet Take 7.5 mg by mouth 2 (two) times daily. 05/22/19  Yes [provider]  metFORMIN (GLUCOPHAGE-XR) 500 MG 24 hr tablet Take 1,000 mg by mouth 2 (two) times daily. 12/19/18  Yes [provider]  metoprolol succinate (TOPROL-XL) 50 MG 24 hr tablet Take 1 tablet (50 mg total) by mouth daily. 01/19/19  Yes Emokpae, Courage, MD  mupirocin ointment (BACTROBAN) 2 % 1 application 2 (two) times daily.   Yes [provider]  OXYGEN Inhale 5 L/hr into the lungs daily.   Yes [provider]  psyllium (METAMUCIL) 58.6 % powder Take 1 packet by mouth 2 (two) times daily.    Yes [provider]  trospium (SANCTURA) 20 MG tablet Take 1 tablet (20 mg total) by mouth 2 (two) times daily. 03/11/22  Yes Irine Seal, MD  mirabegron ER (MYRBETRIQ) 50 MG TB24 tablet Take 1 tablet (50 mg total) by mouth daily. Patient not taking: Reported on 06/03/2022 04/16/22   Irine Seal, MD  terbinafine (LAMISIL) 250 MG tablet Take 500 mg by mouth daily. Patient not taking: Reported on 06/03/2022 01/18/22   [provider]      Allergies    Covid-19 mrna vaccine (pfizer) [covid-19  mrna vacc (moderna)] and Sulfa antibiotics    Review of Systems   Review of Systems  All other systems reviewed and are negative.   Physical Exam Updated Vital Signs BP 122/60   Pulse (!) 102   Temp 98.8 F (37.1 C) (Oral)   Resp 19   Ht _0  (1.702 m)   SpO2 92%   BMI 45.42 kg/m  Physical Exam Vitals and nursing note reviewed.  Constitutional:      General: He is in acute distress.     Appearance: He is well-developed. He is not ill-appearing.     Comments: Distress due to nausea  HENT:     Head: Atraumatic.  Cardiovascular:     Rate and Rhythm: Normal rate.  Pulmonary:     Effort: Pulmonary effort is normal.  Abdominal:     General: There is distension.     Tenderness: There is abdominal tenderness. There is no guarding or rebound.  Musculoskeletal:     Cervical back: Neck supple.  Skin:    General: Skin is warm.  Neurological:     Mental Status: He is alert and oriented to person, place, and time.     ED Results / Procedures / Treatments   Labs (all labs ordered are listed, but only abnormal results are displayed) Labs Reviewed  COMPREHENSIVE METABOLIC PANEL - Abnormal; Notable for the following components:      Result Value   Potassium 3.4 (*)    Chloride 94 (*)    CO2 33 (*)    Glucose, Bld 183 (*)    Creatinine, Ser 1.27 (*)    Total Protein 8.6 (*)    All other components within normal limits  CBC WITH DIFFERENTIAL/PLATELET - Abnormal; Notable for the following components:   WBC 14.5 (*)    RBC 4.12 (*)    Neutro Abs 13.2 (*)    Lymphs Abs 0.5 (*)    Abs Immature Granulocytes 0.09 (*)    All other components within normal limits    EKG EKG Interpretation  Date/Time:  Wednesday June 03 2022 16:22:49 EDT Ventricular Rate:  112 PR Interval:  173 QRS Duration: 86 QT Interval:  298 QTC Calculation: 407 R Axis:   34 Text Interpretation: Sinus tachycardia Probable left atrial enlargement Repol abnrm suggests ischemia, diffuse leads  Nonspecific ST and T wave abnormality Confirmed by Varney Biles [phone]) on 06/03/2022 6:09:30 PM  Radiology CT ABDOMEN PELVIS W CONTRAST  Result Date: 06/03/2022 CLINICAL DATA:  Vomiting and weakness. Bowel obstruction suspected. Patient reports a have not passed gas or stool for 7 days EXAM: CT ABDOMEN AND PELVIS WITH CONTRAST TECHNIQUE: Multidetector CT imaging of the abdomen and pelvis was performed using the standard protocol following bolus administration of intravenous contrast. RADIATION DOSE REDUCTION: This exam was performed according to the departmental dose-optimization program which  includes automated exposure control, adjustment of the mA and/or kV according to patient size and/or use of iterative reconstruction technique. CONTRAST:  128m OMNIPAQUE IOHEXOL 300 MG/ML  SOLN COMPARISON:  CT 01/14/2019 FINDINGS: Lower chest: Mild cardiomegaly. Atelectasis in both lower lobes. No pleural effusion. Hepatobiliary: Mild subjective hepatic steatosis. Gallbladder physiologically distended, no calcified stone. No biliary dilatation. Pancreas: Fatty atrophy.  No ductal dilatation or inflammation. Spleen: Normal in size without focal abnormality. Adrenals/Urinary Tract: No adrenal nodule. Two punctate nonobstructing stones in the mid left kidney. No hydronephrosis. No focal renal lesion. Urinary bladder is partially distended, no wall thickening. Stomach/Bowel: Marked fluid distention of the stomach. Dilated fluid-filled proximal small bowel, greatest small bowel dimension 5.3 cm. There is no evidence of bowel pneumatosis. Mild mesenteric edema. Transition point in the right lower quadrant series 2, image 73. The distal small bowel is decompressed. There is fecalization of distal ileal small bowel contents. Large volume of stool in the ascending, transverse, and descending colon. Small to moderate stool in the sigmoid colon. There is small sigmoid redundancy. No abnormal rectal distention. No colonic  inflammation or evidence of colonic mass. Vascular/Lymphatic: Mild aortic atherosclerosis and tortuosity. Patent portal vein. No portal venous or mesenteric gas. No abdominopelvic adenopathy. Reproductive: Prostate is unremarkable. Other: No free air or ascites. Mild generalized body wall edema. No abdominal wall hernia. Musculoskeletal: Stable sclerotic focus within T9 vertebral body, likely bone island. Degenerative change in the spine and both hips. There is fatty atrophy of the paraspinal musculature. IMPRESSION: 1. Small bowel obstruction with transition point in the right lower quadrant, likely due to adhesions. 2. Large volume of stool in the ascending, transverse, and descending colon, consistent with constipation. 3. Nonobstructing left nephrolithiasis. 4. Mild subjective hepatic steatosis. Aortic Atherosclerosis (ICD10-I70.0). Electronically Signed   By: MKeith RakeM.D.   On: 06/03/2022 22:46    Procedures Procedures    Medications Ordered in ED Medications  ondansetron (ZOFRAN) injection 4 mg (4 mg Intravenous Given 06/03/22 1849)  sodium chloride 0.9 % bolus 1,000 mL (0 mLs Intravenous Stopped 06/03/22 2001)  iohexol (OMNIPAQUE) 300 MG/ML solution 100 mL (100 mLs Intravenous Contrast Given 06/03/22 2226)    ED Course/ Medical Decision Making/ A&P                           Medical Decision Making Amount and/or Complexity of Data Reviewed Labs: ordered. Radiology: ordered.  Risk Prescription drug management. Decision regarding hospitalization.   This patient presents to the ED with chief complaint(s) of nausea, vomiting and abdominal discomfort with no bowel movements over the last 7 days with pertinent past medical history of previous appendectomy, chronic narcotic use and diabetes which further complicates the presenting complaint. The complaint involves an extensive differential diagnosis and also carries with it a high risk of complications and morbidity.    The  differential diagnosis includes : Diabetic ketoacidosis, gastroparesis, ileus, constipation, fecal impaction, small bowel obstruction, dehydration, electrolyte abnormality, AKI.  The initial plan is to get basic labs, start IV fluid hydration and control the symptoms better with IV Zofran.  We will also get CT abdomen and pelvis   Additional history obtained: Additional history obtained from spouse  Independent labs interpretation:  The following labs were independently interpreted: White count with slight elevation.  Labs are otherwise reassuring  Independent visualization of imaging: - I independently visualized the following imaging with scope of interpretation limited to determining acute life threatening conditions related to  emergency care: CT scan of the abdomen and pelvis, which revealed evidence of acute small bowel obstruction without perforation  Treatment and Reassessment: Case discussed with Dr. Constance Haw, she recommends NG and admission.  She also recommends rectal suppositories.  Consultation: - Consulted or discussed management/test interpretation w/ external professional: Dr. Constance Haw, general surgery.  She recommends medicine admission.  11:37 PM The patient appears reasonably screened and/or stabilized for discharge and I doubt any other medical condition or other [facility] requiring further screening, evaluation, or treatment in the ED at this time prior to discharge.   Results from the ER workup discussed with the patient face to face and all questions answered to the best of my ability. The patient is safe for discharge with strict return precautions.  Final Clinical Impression(s) / ED Diagnoses Final diagnoses:  SBO (small bowel obstruction) ([place])    Rx / DC Orders ED Discharge Orders     None         Varney Biles, MD 06/03/22 2337

## 2022-06-03 NOTE — ED Notes (Signed)
Patient transported to CT 

## 2022-06-04 ENCOUNTER — Encounter (HOSPITAL_COMMUNITY): Payer: Self-pay | Admitting: Anesthesiology

## 2022-06-04 ENCOUNTER — Inpatient Hospital Stay (HOSPITAL_COMMUNITY): Payer: Medicare Other

## 2022-06-04 DIAGNOSIS — I10 Essential (primary) hypertension: Secondary | ICD-10-CM | POA: Diagnosis not present

## 2022-06-04 DIAGNOSIS — E1169 Type 2 diabetes mellitus with other specified complication: Secondary | ICD-10-CM

## 2022-06-04 DIAGNOSIS — D72829 Elevated white blood cell count, unspecified: Secondary | ICD-10-CM

## 2022-06-04 DIAGNOSIS — K56609 Unspecified intestinal obstruction, unspecified as to partial versus complete obstruction: Secondary | ICD-10-CM | POA: Diagnosis not present

## 2022-06-04 DIAGNOSIS — E669 Obesity, unspecified: Secondary | ICD-10-CM

## 2022-06-04 DIAGNOSIS — R7989 Other specified abnormal findings of blood chemistry: Secondary | ICD-10-CM | POA: Diagnosis not present

## 2022-06-04 LAB — CBC WITH DIFFERENTIAL/PLATELET
Abs Immature Granulocytes: 0.04 10*3/uL (ref 0.00–0.07)
Basophils Absolute: 0 10*3/uL (ref 0.0–0.1)
Basophils Relative: 0 %
Eosinophils Absolute: 0.3 10*3/uL (ref 0.0–0.5)
Eosinophils Relative: 3 %
HCT: 36.4 % — ABNORMAL LOW (ref 39.0–52.0)
Hemoglobin: 12.2 g/dL — ABNORMAL LOW (ref 13.0–17.0)
Immature Granulocytes: 0 %
Lymphocytes Relative: 9 %
Lymphs Abs: 0.8 10*3/uL (ref 0.7–4.0)
MCH: 32.2 pg (ref 26.0–34.0)
MCHC: 33.5 g/dL (ref 30.0–36.0)
MCV: 96 fL (ref 80.0–100.0)
Monocytes Absolute: 0.5 10*3/uL (ref 0.1–1.0)
Monocytes Relative: 6 %
Neutro Abs: 7.6 10*3/uL (ref 1.7–7.7)
Neutrophils Relative %: 82 %
Platelets: 254 10*3/uL (ref 150–400)
RBC: 3.79 MIL/uL — ABNORMAL LOW (ref 4.22–5.81)
RDW: 12.8 % (ref 11.5–15.5)
WBC: 9.3 10*3/uL (ref 4.0–10.5)
nRBC: 0 % (ref 0.0–0.2)

## 2022-06-04 LAB — OCCULT BLOOD GASTRIC / DUODENUM (SPECIMEN CUP)
Occult Blood, Gastric: POSITIVE — AB
pH, Gastric: 2

## 2022-06-04 LAB — URINALYSIS, ROUTINE W REFLEX MICROSCOPIC
Bacteria, UA: NONE SEEN
Bilirubin Urine: NEGATIVE
Glucose, UA: NEGATIVE mg/dL
Ketones, ur: 5 mg/dL — AB
Leukocytes,Ua: NEGATIVE
Nitrite: NEGATIVE
Protein, ur: 100 mg/dL — AB
Specific Gravity, Urine: 1.031 — ABNORMAL HIGH (ref 1.005–1.030)
pH: 7 (ref 5.0–8.0)

## 2022-06-04 LAB — TSH: TSH: 0.855 u[IU]/mL (ref 0.350–4.500)

## 2022-06-04 LAB — HEMOGLOBIN A1C
Hgb A1c MFr Bld: 6.9 % — ABNORMAL HIGH (ref 4.8–5.6)
Mean Plasma Glucose: 151.33 mg/dL

## 2022-06-04 LAB — COMPREHENSIVE METABOLIC PANEL
ALT: 15 U/L (ref 0–44)
AST: 24 U/L (ref 15–41)
Albumin: 3.8 g/dL (ref 3.5–5.0)
Alkaline Phosphatase: 50 U/L (ref 38–126)
Anion gap: 14 (ref 5–15)
BUN: 19 mg/dL (ref 8–23)
CO2: 32 mmol/L (ref 22–32)
Calcium: 8.4 mg/dL — ABNORMAL LOW (ref 8.9–10.3)
Chloride: 95 mmol/L — ABNORMAL LOW (ref 98–111)
Creatinine, Ser: 1.23 mg/dL (ref 0.61–1.24)
GFR, Estimated: >60 mL/min (ref >60)
Glucose, Bld: 191 mg/dL — ABNORMAL HIGH (ref 70–99)
Potassium: 3.6 mmol/L (ref 3.5–5.1)
Sodium: 141 mmol/L (ref 135–145)
Total Bilirubin: 0.6 mg/dL (ref 0.3–1.2)
Total Protein: 7.6 g/dL (ref 6.5–8.1)

## 2022-06-04 LAB — MAGNESIUM: Magnesium: 1.4 mg/dL — ABNORMAL LOW (ref 1.7–2.4)

## 2022-06-04 LAB — HIV ANTIBODY (ROUTINE TESTING W REFLEX): HIV Screen 4th Generation wRfx: NONREACTIVE

## 2022-06-04 LAB — GLUCOSE, CAPILLARY
Glucose-Capillary: 184 mg/dL — ABNORMAL HIGH (ref 70–99)
Glucose-Capillary: 161 mg/dL — ABNORMAL HIGH (ref 70–99)
Glucose-Capillary: 180 mg/dL — ABNORMAL HIGH (ref 70–99)
Glucose-Capillary: 151 mg/dL — ABNORMAL HIGH (ref 70–99)

## 2022-06-04 LAB — PROCALCITONIN: Procalcitonin: 7.86 ng/mL

## 2022-06-04 MED ORDER — DIATRIZOATE MEGLUMINE & SODIUM 66-10 % PO SOLN
ORAL | Status: AC
  Filled 2022-06-04: qty 90

## 2022-06-04 MED ORDER — DARIFENACIN HYDROBROMIDE ER 7.5 MG PO TB24: 7.5000 mg | ORAL_TABLET | Freq: Every day | ORAL | Status: DC

## 2022-06-04 MED ORDER — METOPROLOL SUCCINATE ER 50 MG PO TB24: 50.0000 mg | ORAL_TABLET | Freq: Every day | ORAL | Status: DC

## 2022-06-04 MED ORDER — ACETAMINOPHEN 325 MG PO TABS
650.0000 mg | ORAL_TABLET | Freq: Four times a day (QID) | ORAL | Status: DC | PRN
Start: 2022-06-04 — End: 2022-06-04

## 2022-06-04 MED ORDER — FAMOTIDINE 40 MG/5ML PO SUSR
20.0000 mg | Freq: Two times a day (BID) | ORAL | Status: DC
  Filled 2022-06-04 (×3): qty 2.5

## 2022-06-04 MED ORDER — ACETAMINOPHEN 650 MG RE SUPP: 650.0000 mg | Freq: Four times a day (QID) | RECTAL | Status: DC | PRN

## 2022-06-04 MED ORDER — HYDROCODONE BIT-HOMATROP MBR 5-1.5 MG/5ML PO SOLN
5.0000 mL | Freq: Once | ORAL | Status: AC
  Administered 2022-06-04: 5 mL via ORAL

## 2022-06-04 MED ORDER — BISACODYL 10 MG RE SUPP: 10.0000 mg | Freq: Once | RECTAL | Status: DC

## 2022-06-04 MED ORDER — ONDANSETRON HCL 4 MG PO TABS
4.0000 mg | ORAL_TABLET | Freq: Four times a day (QID) | ORAL | Status: DC | PRN
Start: 2022-06-04 — End: 2022-06-04

## 2022-06-04 MED ORDER — ACETAMINOPHEN 325 MG PO TABS: 650.0000 mg | ORAL_TABLET | Freq: Four times a day (QID) | ORAL | Status: DC | PRN

## 2022-06-04 MED ORDER — CLONIDINE HCL 0.2 MG PO TABS: 0.2000 mg | ORAL_TABLET | Freq: Three times a day (TID) | ORAL | Status: DC

## 2022-06-04 MED ORDER — INSULIN ASPART 100 UNIT/ML IJ SOLN
0.0000 [IU] | Freq: Every day | INTRAMUSCULAR | Status: DC
  Administered 2022-06-05: 2 [IU] via SUBCUTANEOUS
  Administered 2022-06-09: 3 [IU] via SUBCUTANEOUS

## 2022-06-04 MED ORDER — LOSARTAN POTASSIUM 50 MG PO TABS: 100.0000 mg | ORAL_TABLET | Freq: Every day | ORAL | Status: DC

## 2022-06-04 MED ORDER — FAMOTIDINE 20 MG PO TABS: 20.0000 mg | ORAL_TABLET | Freq: Two times a day (BID) | ORAL | Status: DC

## 2022-06-04 MED ORDER — POTASSIUM CHLORIDE 10 MEQ/100ML IV SOLN
10.0000 meq | INTRAVENOUS | Status: AC
  Administered 2022-06-04 (×2): 10 meq via INTRAVENOUS
  Filled 2022-06-04 (×2): qty 100

## 2022-06-04 MED ORDER — ONDANSETRON HCL 4 MG/2ML IJ SOLN
4.0000 mg | Freq: Four times a day (QID) | INTRAMUSCULAR | Status: DC | PRN
  Administered 2022-06-05 – 2022-06-07 (×3): 4 mg via INTRAVENOUS
  Filled 2022-06-04 (×4): qty 2

## 2022-06-04 MED ORDER — HEPARIN SODIUM (PORCINE) 5000 UNIT/ML IJ SOLN
5000.0000 [IU] | Freq: Three times a day (TID) | INTRAMUSCULAR | Status: DC
  Administered 2022-06-04 – 2022-06-10 (×19): 5000 [IU] via SUBCUTANEOUS
  Filled 2022-06-04 (×18): qty 1

## 2022-06-04 MED ORDER — CROMOLYN SODIUM 5.2 MG/ACT NA AERS
1.0000 | INHALATION_SPRAY | Freq: Three times a day (TID) | NASAL | Status: DC
Start: 2022-06-04 — End: 2022-06-10
  Administered 2022-06-08 – 2022-06-10 (×5): 1 via NASAL
  Filled 2022-06-04: qty 26

## 2022-06-04 MED ORDER — MIRABEGRON ER 25 MG PO TB24: 50.0000 mg | ORAL_TABLET | Freq: Every day | ORAL | Status: DC

## 2022-06-04 MED ORDER — LOSARTAN POTASSIUM 25 MG PO TABS: 100.0000 mg | ORAL_TABLET | Freq: Every day | ORAL | Status: DC

## 2022-06-04 MED ORDER — MORPHINE SULFATE (PF) 2 MG/ML IV SOLN: 2.0000 mg | INTRAVENOUS | Status: DC | PRN

## 2022-06-04 MED ORDER — SODIUM CHLORIDE 0.9 % IV SOLN: INTRAVENOUS | Status: DC

## 2022-06-04 MED ORDER — AMLODIPINE BESYLATE 5 MG PO TABS
10.0000 mg | ORAL_TABLET | Freq: Every day | ORAL | Status: DC
  Administered 2022-06-05 – 2022-06-07 (×3): 10 mg
  Filled 2022-06-04 (×4): qty 2

## 2022-06-04 MED ORDER — BISACODYL 10 MG RE SUPP
10.0000 mg | Freq: Two times a day (BID) | RECTAL | Status: DC
  Administered 2022-06-04 – 2022-06-08 (×8): 10 mg via RECTAL
  Filled 2022-06-04 (×10): qty 1

## 2022-06-04 MED ORDER — LOSARTAN POTASSIUM 50 MG PO TABS
100.0000 mg | ORAL_TABLET | Freq: Every day | ORAL | Status: DC
  Filled 2022-06-04: qty 2

## 2022-06-04 MED ORDER — PHENOL 1.4 % MT LIQD
1.0000 | OROMUCOSAL | Status: DC | PRN
  Administered 2022-06-04 – 2022-06-07 (×3): 1 via OROMUCOSAL
  Filled 2022-06-04 (×3): qty 177

## 2022-06-04 MED ORDER — INSULIN ASPART 100 UNIT/ML IJ SOLN
0.0000 [IU] | Freq: Three times a day (TID) | INTRAMUSCULAR | Status: DC
  Administered 2022-06-04 – 2022-06-05 (×4): 3 [IU] via SUBCUTANEOUS
  Administered 2022-06-05 – 2022-06-06 (×3): 5 [IU] via SUBCUTANEOUS
  Administered 2022-06-06 (×2): 3 [IU] via SUBCUTANEOUS
  Administered 2022-06-07 (×3): 2 [IU] via SUBCUTANEOUS
  Administered 2022-06-08: 5 [IU] via SUBCUTANEOUS
  Administered 2022-06-08: 3 [IU] via SUBCUTANEOUS
  Administered 2022-06-08: 2 [IU] via SUBCUTANEOUS
  Administered 2022-06-09: 3 [IU] via SUBCUTANEOUS
  Administered 2022-06-09: 5 [IU] via SUBCUTANEOUS
  Administered 2022-06-09: 8 [IU] via SUBCUTANEOUS
  Administered 2022-06-10: 11 [IU] via SUBCUTANEOUS
  Administered 2022-06-10: 3 [IU] via SUBCUTANEOUS

## 2022-06-04 MED ORDER — ONDANSETRON HCL 4 MG PO TABS: 4.0000 mg | ORAL_TABLET | Freq: Four times a day (QID) | ORAL | Status: DC | PRN

## 2022-06-04 MED ORDER — OXYCODONE HCL 5 MG PO TABS: 5.0000 mg | ORAL_TABLET | ORAL | Status: DC | PRN

## 2022-06-04 MED ORDER — HYDROCODONE BIT-HOMATROP MBR 5-1.5 MG/5ML PO SOLN
ORAL | Status: AC
  Filled 2022-06-04: qty 5

## 2022-06-04 MED ORDER — ONDANSETRON HCL 4 MG/2ML IJ SOLN: 4.0000 mg | Freq: Four times a day (QID) | INTRAMUSCULAR | Status: DC | PRN

## 2022-06-04 MED ORDER — HYDRALAZINE HCL 20 MG/ML IJ SOLN: 10.0000 mg | Freq: Four times a day (QID) | INTRAMUSCULAR | Status: DC | PRN

## 2022-06-04 MED ORDER — DIATRIZOATE MEGLUMINE & SODIUM 66-10 % PO SOLN
90.0000 mL | Freq: Once | ORAL | Status: AC
  Administered 2022-06-04: 90 mL via NASOGASTRIC

## 2022-06-04 MED ORDER — CLONIDINE HCL 0.2 MG PO TABS
0.2000 mg | ORAL_TABLET | Freq: Three times a day (TID) | ORAL | Status: DC
  Administered 2022-06-04 – 2022-06-05 (×3): 0.2 mg
  Filled 2022-06-04 (×3): qty 2
  Filled 2022-06-04: qty 1

## 2022-06-04 MED ORDER — AMLODIPINE BESYLATE 5 MG PO TABS: 10.0000 mg | ORAL_TABLET | Freq: Every day | ORAL | Status: DC

## 2022-06-04 NOTE — Assessment & Plan Note (Addendum)
-   Likely at baseline, he was trending up -Discontinue IV fluids Lab Results  Component Value Date   CREATININE 1.23 06/09/2022   CREATININE 1.56 (H) 06/08/2022   CREATININE 1.88 (H) 06/07/2022    -Trending creatinine  -Avoiding nephrotoxins

## 2022-06-04 NOTE — ED Notes (Signed)
Patient transported to X-ray 

## 2022-06-04 NOTE — Assessment & Plan Note (Addendum)
-  White blood cell count 14.5 >> > 11.1, 7.9 -Technically meets SIRS criteria, but likely from pain and not from infection, afebrile, -sepsis ruled out -Leukocytosis is thought to be secondary to SBO -No antibiotics at this time

## 2022-06-04 NOTE — Assessment & Plan Note (Addendum)
-  Resolved with medical management -Tolerating soft diet and having BMs -cleared by surgery for d/c -KUB small bowel follow-through revealing contrast throughout the colon -General surgery was following, signed off -NG tube discontinued -initially on IVF -Hemoccult also positive, likely due to SBO,no melena or bleeding was noted, H&H stable Follow-up as an outpatient with a gastroenterologist

## 2022-06-04 NOTE — Progress Notes (Signed)
  Transition of Care [facility]) Screening Note   Patient Details  Name: Colton Jackson. Date of Birth: 1951/09/06   Transition of Care [facility]) CM/SW Contact:    Ihor Gully, LCSW Phone Number: 06/04/2022, 2:05 PM    Transition of Care Department [facility]) has reviewed patient and no TOC needs have been identified at this time. We will continue to monitor patient advancement through interdisciplinary progression rounds. If new patient transition needs arise, please place a TOC consult.

## 2022-06-04 NOTE — Care Management Important Message (Signed)
Important Message  Patient Details  Name: Colton Jackson. MRN: [id number] Date of Birth: 10-12-1951   Medicare Important Message Given:  N/A - LOS <3 / Initial given by admissions     Dannette Barbara 06/04/2022, 5:20 PM

## 2022-06-04 NOTE — Assessment & Plan Note (Addendum)
-  Holding amlodipine, clonidine, losartan Till  tolerating p.o. -Continue Toprol-XL  -Continue to monitor -BP stabilizing -Stable, as needed hydralazine

## 2022-06-04 NOTE — Assessment & Plan Note (Addendum)
-   Hold metformin and glipizide CBG (last 3)  Recent Labs    06/08/22 2157 06/09/22 0709 06/09/22 1108  GLUCAP 190* 155* 252*    -Tolerating p.o., switching CBG to q. ACHS with SSI coverage -Starting clear liquid diet, titrating insulin accordingly -Continue to monitor -Adding long-acting insulin-increasing dose accordingly -d/c home with metformin and glipizide -06/04/22 A1C--6.9

## 2022-06-04 NOTE — ED Notes (Signed)
Lab at bedside

## 2022-06-04 NOTE — Progress Notes (Signed)
PROGRESS NOTE    Patient: Colton Jackson.                            PCP: Sharilyn Sites, MD                    DOB: [birth year]            DOA: 06/03/2022 TLX:[id number]             DOS: 06/04/2022, 12:54 PM   LOS: 1 day   Date of Service: The patient was seen and examined on 06/04/2022  Subjective:   The patient was seen and examined this morning. Stable at this time. Still complaining of :  Otherwise no issues overnight .  Brief Narrative:   Tajae Rybicki. is a 71 y.o. male with medical history significant of neck artery aneurysm, hypertension, GERD, OSA, thyroid nodule, diabetes mellitus type 2, and more presents the ED with a chief complaint of abdominal distention and nausea vomiting.   Patient reports that his abdomen started getting distended the day prior to presentation.  On the day of presentation, he reports vomiting that first look like undigested food and then lifted and felt like feces.  Patient reports no blood in his emesis, but the NG tube does have blood in the output.  Patient reports that his last bowel movement was 1 week ago.  Approximately 6 weeks ago he had 5-6 days of diarrhea that spontaneously resolved.  Patient reports he has had abdominal pain for approximately 1 week that feels like fullness.  Reports has been progressively worse since it started.  He has never had symptoms like this before.  He does report chest pain that is associated with the abdomen.  He reports that it feels like indigestion coming up from his abdomen.  He has had dyspnea that is chronic for him.  He is dyspnea is worse with exertion.  He wears 4 L nasal cannula at home.  Patient denies any fevers, chills, or other symptoms.   Patient does not smoke, does not drink, does not use illicit drugs.  He is vaccinated for COVID.  Patient is full code.  ED:  Temp 98.1, heart rate 100-111, respiratory rate 10-40, blood pressure 107/55-174/109, satting 85-100% Leukocytosis 14.5, hemoglobin  13.1 Hypokalemia at 3.4 CT abdomen pelvis shows small bowel obstruction with right lower quadrant transition zone likely due to adhesions.  Patient does have a history of appendectomy as a child General surgery was consulted and recommends suppositories, NG tube, n.p.o. Admission was requested for small bowel obstruction management    Assessment & Plan:   Principal Problem:   SBO (small bowel obstruction) (HCC) Active Problems:   Essential hypertension   Elevated serum creatinine   Diabetes mellitus type 2 in obese (HCC)   Leukocytosis     Assessment and Plan: * SBO (small bowel obstruction) ([place]) - General surgery aware and will see patient in the a.m. -N.p.o. -NG tube -Continue maintenance IV fluids  -As needed analgesics -Hemoccult also positive, monitoring closely  Leukocytosis - White blood cell count 14.5 >> 9.3 -Technically meets SIRS criteria, but likely from pain and not from infection, afebrile, -Leukocytosis is thought to be secondary to SBO -No antibiotics at this time -Monitoring closely  Diabetes mellitus type 2 in obese (HCC) - Hold metformin and glipizide -Checking CBG every 4-6 hours forward SSI coverage -N.p.o. -Continue to monitor  Elevated serum creatinine - Likely  at baseline, he was trending up through 2020  -Continue IV fluids -Trending creatinine 1.27, 1.23 today    Essential hypertension - Continue amlodipine, clonidine, metoprolol When tolerating p.o. -Holding losartan -Continue to monitor -Stable, as needed hydralazine    ----------------------------------------------------------------------------------------------------------------------------------------------- Obesity - nutritional status:  The patient's BMI is: Body mass index is 44.44 kg/m. I agree with the assessment and plan as outlined  Currently  n.p.o. --------------------------------------------------------------------------------------------------------------------------------------------  DVT prophylaxis:  heparin injection 5,000 Units Start: 06/04/22 0600 SCDs Start: 06/04/22 0406   Code Status:   Code Status: Full Code  Family Communication: No family member present at bedside- attempt will be made to update daily The above findings and plan of care has been discussed with patient (and family)  in detail,  they expressed understanding and agreement of above. -Advance care planning has been discussed.   Admission status:   Status is: Inpatient Remains inpatient appropriate because: Needing surgical management including NG tube, IV fluids, surgery evaluation for possible intervention     Procedures:   No admission procedures for hospital encounter.   Antimicrobials:  Anti-infectives (From admission, onward)    None        Medication:   amLODipine  10 mg Per Tube Daily   bisacodyl  10 mg Rectal Once   bisacodyl  10 mg Rectal BID   cloNIDine  0.2 mg Per Tube TID   cromolyn  1 spray Each Nare TID   diatrizoate meglumine-sodium  90 mL Per NG tube Once   famotidine  20 mg Per Tube BID   heparin  5,000 Units Subcutaneous Q8H   HYDROcodone bit-homatropine       insulin aspart  0-15 Units Subcutaneous TID WC   insulin aspart  0-5 Units Subcutaneous QHS   [START ON 06/05/2022] losartan  100 mg Per Tube Daily    acetaminophen **OR** acetaminophen, hydrALAZINE, HYDROcodone bit-homatropine, morphine injection, ondansetron **OR** ondansetron (ZOFRAN) IV, oxyCODONE, phenol   Objective:   Vitals:   06/04/22 0515 06/04/22 0614 06/04/22 0858 06/04/22 1248  BP: 139/75  123/67 131/72  Pulse: 100  (!) 104 (!) 110  Resp: (!) _0 Temp: 98.6 F (37 C)  99.6 F (37.6 C) 98.3 F (36.8 C)  TempSrc:   Oral Oral  SpO2: 91%  93% 93%  Weight:  128.7 kg    Height:        Intake/Output Summary (Last 24 hours)  at 06/04/2022 1254 Last data filed at 06/04/2022 0900 Gross per 24 hour  Intake 175 ml  Output 4100 ml  Net -3925 ml   Filed Weights   06/04/22 0614  Weight: 128.7 kg     Examination:   Physical Exam  Constitution: NG tube in place alert, cooperative, no distress,  Appears calm and comfortable  Psychiatric:   Normal and stable mood and affect, cognition intact,   HEENT:        Normocephalic, PERRL, otherwise with in Normal limits  Chest:         Chest symmetric Cardio vascular:  S1/S2, RRR, No murmure, No Rubs or Gallops  pulmonary: Clear to auscultation bilaterally, respirations unlabored, negative wheezes / crackles Abdomen: Hypoactive bowel sounds  soft, non-tender, distended, no masses, no organomegaly Muscular skeletal: Limited exam - in bed, able to move all 4 extremities,   Neuro: CNII-XII intact. , normal motor and sensation, reflexes intact  Extremities: No pitting edema lower extremities, +2 pulses  Skin: Dry, warm to touch, negative for any Rashes, No open  wounds Wounds: per nursing documentation   ------------------------------------------------------------------------------------------------------------------------------------------    LABs:     Latest Ref Rng & Units 06/04/2022    4:32 AM 06/03/2022    5:51 PM 02/04/2021   11:40 AM  CBC  WBC 4.0 - 10.5 K/uL 9.3  14.5  10.7   Hemoglobin 13.0 - 17.0 g/dL 12.2  13.1  14.1   Hematocrit 39.0 - 52.0 % 36.4  39.6  41.8   Platelets 150 - 400 K/uL 254  275  317.0       Latest Ref Rng & Units 06/04/2022    4:32 AM 06/03/2022    5:51 PM 07/06/2019   12:05 PM  CMP  Glucose 70 - 99 mg/dL 191  183  220   BUN 8 - 23 mg/dL _0 Creatinine 0.61 - 1.24 mg/dL 1.23  1.27  1.04   Sodium 135 - 145 mmol/L 141  142  140   Potassium 3.5 - 5.1 mmol/L 3.6  3.4  3.8   Chloride 98 - 111 mmol/L 95  94  100   CO2 22 - 32 mmol/L 32  33  29   Calcium 8.9 - 10.3 mg/dL 8.4  9.0  10.3   Total Protein 6.5 - 8.1 g/dL 7.6  8.6     Total Bilirubin 0.3 - 1.2 mg/dL 0.6  0.8    Alkaline Phos 38 - 126 U/L 50  59    AST 15 - 41 U/L 24  25    ALT 0 - 44 U/L 15  14         Micro Results No results found for this or any previous visit (from the past 240 hour(s)).  Radiology Reports DG Chest Portable 1 View  Result Date: 06/04/2022 CLINICAL DATA:  NG tube placement. EXAM: PORTABLE CHEST 1 VIEW COMPARISON:  Abdominal CT earlier today FINDINGS: Tip and side port of the enteric tube below the diaphragm in the stomach. There is streaky bibasilar atelectasis. Lung volumes are low. Heart size is normal for technique. No pneumothorax or large pleural effusion. IMPRESSION: 1. Tip and side port of the enteric tube below the diaphragm in the stomach. 2. Low lung volumes with streaky bibasilar atelectasis. Electronically Signed   By: Keith Rake M.D.   On: 06/04/2022 00:28   CT ABDOMEN PELVIS W CONTRAST  Result Date: 06/03/2022 CLINICAL DATA:  Vomiting and weakness. Bowel obstruction suspected. Patient reports a have not passed gas or stool for 7 days EXAM: CT ABDOMEN AND PELVIS WITH CONTRAST TECHNIQUE: Multidetector CT imaging of the abdomen and pelvis was performed using the standard protocol following bolus administration of intravenous contrast. RADIATION DOSE REDUCTION: This exam was performed according to the departmental dose-optimization program which includes automated exposure control, adjustment of the mA and/or kV according to patient size and/or use of iterative reconstruction technique. CONTRAST:  133m OMNIPAQUE IOHEXOL 300 MG/ML  SOLN COMPARISON:  CT 01/14/2019 FINDINGS: Lower chest: Mild cardiomegaly. Atelectasis in both lower lobes. No pleural effusion. Hepatobiliary: Mild subjective hepatic steatosis. Gallbladder physiologically distended, no calcified stone. No biliary dilatation. Pancreas: Fatty atrophy.  No ductal dilatation or inflammation. Spleen: Normal in size without focal abnormality. Adrenals/Urinary Tract:  No adrenal nodule. Two punctate nonobstructing stones in the mid left kidney. No hydronephrosis. No focal renal lesion. Urinary bladder is partially distended, no wall thickening. Stomach/Bowel: Marked fluid distention of the stomach. Dilated fluid-filled proximal small bowel, greatest small bowel dimension 5.3 cm. There is no evidence of bowel  pneumatosis. Mild mesenteric edema. Transition point in the right lower quadrant series 2, image 73. The distal small bowel is decompressed. There is fecalization of distal ileal small bowel contents. Large volume of stool in the ascending, transverse, and descending colon. Small to moderate stool in the sigmoid colon. There is small sigmoid redundancy. No abnormal rectal distention. No colonic inflammation or evidence of colonic mass. Vascular/Lymphatic: Mild aortic atherosclerosis and tortuosity. Patent portal vein. No portal venous or mesenteric gas. No abdominopelvic adenopathy. Reproductive: Prostate is unremarkable. Other: No free air or ascites. Mild generalized body wall edema. No abdominal wall hernia. Musculoskeletal: Stable sclerotic focus within T9 vertebral body, likely bone island. Degenerative change in the spine and both hips. There is fatty atrophy of the paraspinal musculature. IMPRESSION: 1. Small bowel obstruction with transition point in the right lower quadrant, likely due to adhesions. 2. Large volume of stool in the ascending, transverse, and descending colon, consistent with constipation. 3. Nonobstructing left nephrolithiasis. 4. Mild subjective hepatic steatosis. Aortic Atherosclerosis (ICD10-I70.0). Electronically Signed   By: Keith Rake M.D.   On: 06/03/2022 22:46    SIGNED: Deatra Trampus, MD, FHM. [facility],  Pager (please use [email or web address] to page/text) Please use Epic Secure Chat for non-urgent communication (7AM-7PM)  If 7PM-7AM, please contact night-coverage [email or web address], 06/04/2022, 12:54 PM

## 2022-06-04 NOTE — Progress Notes (Signed)
[facility]  SBFT with continued SBO. NG and NPO. KUB in AM.   Curlene Labrum, MD [facility] [address] [place], [place] [id number] [phone] (office)

## 2022-06-04 NOTE — Consult Note (Addendum)
[facility] Consult  Reason for Consult:small bowel obstruction Referring Physician: Dr. Varney Biles  Chief Complaint   Emesis     HPI: Colton Jackson. is a 71 y.o. male with a history of hypertension, GERD, OSA, and DM2 who presented to the hospital with abdominal pain and distension with nausea and vomiting.  Patient reports that he has had mild indigestion symptoms since the beginning of the week, which became significantly more intense yesterday 7/19. He reports yesterday the indigestion caused a much more painful burning diffusely throughout the abdomen, which was accompanied by nausea and vomiting. He reports that at baseline he does experience constipation with sometimes 4-5 days between bowel movements. Leading up to yesterday he reports his last bowel movement was 6-7 days ago. He reports a previous small bowel obstruction he was admitted for a few years ago.   Past Medical History:  Diagnosis Date   Allergic rhinitis    Aneurysm artery, neck (HCC)    [name] pulmonologist   Degenerative disk disease    Essential hypertension    GERD (gastroesophageal reflux disease)    H/O cardiovascular stress test 02/02/11   EF 59% - Normal stress test   Hearing deficit, right    Knee pain, bilateral    Lower back pain    Morbid obesity (HCC)    OSA (obstructive sleep apnea)    On CPAP   Thyroid nodule    Type 2 diabetes mellitus ([place])    Type 2 diabetes mellitus without complication ([place]) 08/14/2445   Qualifier: Diagnosis of  By: Annamaria Boots MD, Clinton D     Past Surgical History:  Procedure Laterality Date   APPENDECTOMY     CARDIAC CATHETERIZATION     Cataract surgery Bilateral    COLONOSCOPY WITH PROPOFOL N/A 08/01/2019   Procedure: COLONOSCOPY WITH PROPOFOL;  Surgeon: Aviva Signs, MD;  Location: [facility];  Service: General;  Laterality: N/A;   KNEE ARTHROSCOPY Bilateral    NASAL SEPTOPLASTY W/ TURBINOPLASTY     RIGHT/LEFT HEART CATH AND CORONARY  ANGIOGRAPHY N/A 06/23/2018   Procedure: RIGHT/LEFT HEART CATH AND CORONARY ANGIOGRAPHY;  Surgeon: Larey Dresser, MD;  Location: [facility];  Service: Cardiovascular;  Laterality: N/A;    Family History  Problem Relation Age of Onset   Lung disease Father    Stroke Father 62   Diabetes Father    Diabetes Maternal Grandfather    Heart disease Paternal Grandmother    Stroke Paternal Grandfather 47   Diabetes Paternal Grandfather    Diabetes Mother    Cancer Mother    Stroke Mother 61   Diabetes Sister    Diabetes Brother 22    Social History   Tobacco Use   Smoking status: Former    Types: Cigarettes    Quit date: 1971    Years since quitting: 52.5   Smokeless tobacco: Never   Tobacco comments:    smoked when he was a teenager  Scientific laboratory technician Use: Never used  Substance Use Topics   Alcohol use: No   Drug use: No    Medications: I have reviewed the patient's current medications.  Scheduled:  amLODipine  10 mg Per Tube Daily   bisacodyl  10 mg Rectal Once   bisacodyl  10 mg Rectal BID   cloNIDine  0.2 mg Per Tube TID   cromolyn  1 spray Each Nare TID   diatrizoate meglumine-sodium  90 mL Per NG tube Once  famotidine  20 mg Per Tube BID   heparin  5,000 Units Subcutaneous Q8H   HYDROcodone bit-homatropine       insulin aspart  0-15 Units Subcutaneous TID WC   insulin aspart  0-5 Units Subcutaneous QHS   [START ON 06/05/2022] losartan  100 mg Per Tube Daily   Continuous:  sodium chloride 125 mL/hr at 06/04/22 5189   QMK:[id number] **OR** acetaminophen, hydrALAZINE, HYDROcodone bit-homatropine, morphine injection, ondansetron **OR** ondansetron (ZOFRAN) IV, oxyCODONE, phenol  Allergies  Allergen Reactions   Covid-19 Mrna Vaccine (Pfizer) [Covid-19 Mrna Vacc (Moderna)] Other (See Comments)    Joint pain and fatigue   Sulfa Antibiotics Swelling    eyelids    ROS:  Pertinent items are noted in HPI.  Blood pressure 123/67, pulse (!) 104,  temperature 99.6 F (37.6 C), temperature source Oral, resp. rate 20, height _0  (1.702 m), weight 128.7 kg, SpO2 93 %.  Physical Exam Constitutional:      General: He is not in acute distress.    Comments: Gastric tube in; Patient sitting in chair watching TV  HENT:     Head: Normocephalic.  Cardiovascular:     Rate and Rhythm: Normal rate and regular rhythm.  Pulmonary:     Effort: Pulmonary effort is normal.     Comments: Patient on nasal canula - coughs on deep inspiration Abdominal:     General: Abdomen is flat. Bowel sounds are decreased. There is distension.     Tenderness: There is no abdominal tenderness.     Comments: Tympanic on palpation  Skin:    General: Skin is warm and dry.  Neurological:     Mental Status: He is alert and oriented to person, place, and time.  Psychiatric:        Mood and Affect: Mood normal.        Behavior: Behavior normal.     Results: Results for orders placed or performed during the hospital encounter of 06/03/22 (from the past 48 hour(s))  Comprehensive metabolic panel     Status: Abnormal   Collection Time: 06/03/22  5:51 PM  Result Value Ref Range   Sodium 142 135 - 145 mmol/L   Potassium 3.4 (L) 3.5 - 5.1 mmol/L   Chloride 94 (L) 98 - 111 mmol/L   CO2 33 (H) 22 - 32 mmol/L   Glucose, Bld 183 (H) 70 - 99 mg/dL    Comment: Glucose reference range applies only to samples taken after fasting for at least 8 hours.   BUN 19 8 - 23 mg/dL   Creatinine, Ser 1.27 (H) 0.61 - 1.24 mg/dL   Calcium 9.0 8.9 - 10.3 mg/dL   Total Protein 8.6 (H) 6.5 - 8.1 g/dL   Albumin 4.5 3.5 - 5.0 g/dL   AST 25 15 - 41 U/L   ALT 14 0 - 44 U/L   Alkaline Phosphatase 59 38 - 126 U/L   Total Bilirubin 0.8 0.3 - 1.2 mg/dL   GFR, Estimated >60 >60 mL/min    Comment: (NOTE) Calculated using the CKD-EPI Creatinine Equation (2021)    Anion gap 15 5 - 15    Comment: Performed at [facility], [address], [place], [place] [address]  CBC with Differential      Status: Abnormal   Collection Time: 06/03/22  5:51 PM  Result Value Ref Range   WBC 14.5 (H) 4.0 - 10.5 K/uL   RBC 4.12 (L) 4.22 - 5.81 MIL/uL   Hemoglobin 13.1 13.0 - 17.0 g/dL  HCT 39.6 39.0 - 52.0 %   MCV 96.1 80.0 - 100.0 fL   MCH 31.8 26.0 - 34.0 pg   MCHC 33.1 30.0 - 36.0 g/dL   RDW 12.6 11.5 - 15.5 %   Platelets 275 150 - 400 K/uL   nRBC 0.0 0.0 - 0.2 %   Neutrophils Relative % 91 %   Neutro Abs 13.2 (H) 1.7 - 7.7 K/uL   Lymphocytes Relative 3 %   Lymphs Abs 0.5 (L) 0.7 - 4.0 K/uL   Monocytes Relative 5 %   Monocytes Absolute 0.8 0.1 - 1.0 K/uL   Eosinophils Relative 0 %   Eosinophils Absolute 0.0 0.0 - 0.5 K/uL   Basophils Relative 0 %   Basophils Absolute 0.0 0.0 - 0.1 K/uL   Immature Granulocytes 1 %   Abs Immature Granulocytes 0.09 (H) 0.00 - 0.07 K/uL    Comment: Performed at [facility], [address], [place], [place] [address]  Procalcitonin - Baseline     Status: None   Collection Time: 06/04/22  1:00 AM  Result Value Ref Range   Procalcitonin 7.86 ng/mL    Comment:        Interpretation: PCT > 2 ng/mL: Systemic infection (sepsis) is likely, unless other causes are known. (NOTE)       Sepsis PCT Algorithm           Lower Respiratory Tract                                      Infection PCT Algorithm    ----------------------------     ----------------------------         PCT < 0.25 ng/mL                PCT < 0.10 ng/mL          Strongly encourage             Strongly discourage   discontinuation of antibiotics    initiation of antibiotics    ----------------------------     -----------------------------       PCT 0.25 - 0.50 ng/mL            PCT 0.10 - 0.25 ng/mL               OR       >80% decrease in PCT            Discourage initiation of                                            antibiotics      Encourage discontinuation           of antibiotics    ----------------------------     -----------------------------         PCT >= 0.50 ng/mL               PCT 0.26 - 0.50 ng/mL               AND       <80% decrease in PCT              Encourage initiation of  antibiotics       Encourage continuation           of antibiotics    ----------------------------     -----------------------------        PCT >= 0.50 ng/mL                  PCT > 0.50 ng/mL               AND         increase in PCT                  Strongly encourage                                      initiation of antibiotics    Strongly encourage escalation           of antibiotics                                     -----------------------------                                           PCT <= 0.25 ng/mL                                                 OR                                        > 80% decrease in PCT                                      Discontinue / Do not initiate                                             antibiotics  Performed at [facility], [address], [place], [place] [address]   Occult bld gastric/duodenum (cup to lab)     Status: Abnormal   Collection Time: 06/04/22  2:49 AM  Result Value Ref Range   pH, Gastric 2    Occult Blood, Gastric POSITIVE (A) NEGATIVE    Comment: Performed at [facility], [address], [place], [place] [address]  Urinalysis, Routine w reflex microscopic Urine, Clean Catch     Status: Abnormal   Collection Time: 06/04/22  4:06 AM  Result Value Ref Range   Color, Urine YELLOW YELLOW   APPearance CLEAR CLEAR   Specific Gravity, Urine 1.031 (H) 1.005 - 1.030   pH 7.0 5.0 - 8.0   Glucose, UA NEGATIVE NEGATIVE mg/dL   Hgb urine dipstick SMALL (A) NEGATIVE   Bilirubin Urine NEGATIVE NEGATIVE   Ketones, ur 5 (A) NEGATIVE mg/dL   Protein, ur 100 (A) NEGATIVE mg/dL   Nitrite NEGATIVE NEGATIVE   Leukocytes,Ua NEGATIVE NEGATIVE  RBC / HPF 0-5 0 - 5 RBC/hpf   WBC, UA 0-5 0 - 5 WBC/hpf   Bacteria, UA NONE SEEN NONE SEEN   Squamous Epithelial / LPF 0-5 0 - 5    Comment:  Performed at [facility], [address], [place], [place] [address]  Hemoglobin A1c     Status: Abnormal   Collection Time: 06/04/22  4:32 AM  Result Value Ref Range   Hgb A1c MFr Bld 6.9 (H) 4.8 - 5.6 %    Comment: (NOTE) Pre diabetes:          5.7%-6.4%  Diabetes:              >6.4%  Glycemic control for   <7.0% adults with diabetes    Mean Plasma Glucose 151.33 mg/dL    Comment: Performed at [place] [address], [place], [place] [address]  HIV Antibody (routine testing w rflx)     Status: None   Collection Time: 06/04/22  4:32 AM  Result Value Ref Range   HIV Screen 4th Generation wRfx Non Reactive Non Reactive    Comment: Performed at [facility], [place] [address], [place], [place] [address]  Comprehensive metabolic panel     Status: Abnormal   Collection Time: 06/04/22  4:32 AM  Result Value Ref Range   Sodium 141 135 - 145 mmol/L   Potassium 3.6 3.5 - 5.1 mmol/L   Chloride 95 (L) 98 - 111 mmol/L   CO2 32 22 - 32 mmol/L   Glucose, Bld 191 (H) 70 - 99 mg/dL    Comment: Glucose reference range applies only to samples taken after fasting for at least 8 hours.   BUN 19 8 - 23 mg/dL   Creatinine, Ser 1.23 0.61 - 1.24 mg/dL   Calcium 8.4 (L) 8.9 - 10.3 mg/dL   Total Protein 7.6 6.5 - 8.1 g/dL   Albumin 3.8 3.5 - 5.0 g/dL   AST 24 15 - 41 U/L   ALT 15 0 - 44 U/L   Alkaline Phosphatase 50 38 - 126 U/L   Total Bilirubin 0.6 0.3 - 1.2 mg/dL   GFR, Estimated >60 >60 mL/min    Comment: (NOTE) Calculated using the CKD-EPI Creatinine Equation (2021)    Anion gap 14 5 - 15    Comment: Performed at [facility], [address] [place], [place] [address]  Magnesium     Status: Abnormal   Collection Time: 06/04/22  4:32 AM  Result Value Ref Range   Magnesium 1.4 (L) 1.7 - 2.4 mg/dL    Comment: Performed at [facility], [address], [place], [place] [address]  CBC with Differential/Platelet     Status: Abnormal   Collection Time: 06/04/22  4:32 AM  Result  Value Ref Range   WBC 9.3 4.0 - 10.5 K/uL   RBC 3.79 (L) 4.22 - 5.81 MIL/uL   Hemoglobin 12.2 (L) 13.0 - 17.0 g/dL   HCT 36.4 (L) 39.0 - 52.0 %   MCV 96.0 80.0 - 100.0 fL   MCH 32.2 26.0 - 34.0 pg   MCHC 33.5 30.0 - 36.0 g/dL   RDW 12.8 11.5 - 15.5 %   Platelets 254 150 - 400 K/uL   nRBC 0.0 0.0 - 0.2 %   Neutrophils Relative % 82 %   Neutro Abs 7.6 1.7 - 7.7 K/uL   Lymphocytes Relative 9 %   Lymphs Abs 0.8 0.7 - 4.0 K/uL   Monocytes Relative 6 %  Monocytes Absolute 0.5 0.1 - 1.0 K/uL   Eosinophils Relative 3 %   Eosinophils Absolute 0.3 0.0 - 0.5 K/uL   Basophils Relative 0 %   Basophils Absolute 0.0 0.0 - 0.1 K/uL   Immature Granulocytes 0 %   Abs Immature Granulocytes 0.04 0.00 - 0.07 K/uL    Comment: Performed at [facility], [address] [place], [place] [address]  TSH     Status: None   Collection Time: 06/04/22  4:32 AM  Result Value Ref Range   TSH 0.855 0.350 - 4.500 uIU/mL    Comment: Performed by a 3rd Generation assay with a functional sensitivity of <=0.01 uIU/mL. Performed at [facility], [address] [place], [place] [address]   Glucose, capillary     Status: Abnormal   Collection Time: 06/04/22  7:15 AM  Result Value Ref Range   Glucose-Capillary 184 (H) 70 - 99 mg/dL    Comment: Glucose reference range applies only to samples taken after fasting for at least 8 hours.    DG Chest Portable 1 View  Result Date: 06/04/2022 CLINICAL DATA:  NG tube placement. EXAM: PORTABLE CHEST 1 VIEW COMPARISON:  Abdominal CT earlier today FINDINGS: Tip and side port of the enteric tube below the diaphragm in the stomach. There is streaky bibasilar atelectasis. Lung volumes are low. Heart size is normal for technique. No pneumothorax or large pleural effusion. IMPRESSION: 1. Tip and side port of the enteric tube below the diaphragm in the stomach. 2. Low lung volumes with streaky bibasilar atelectasis. Electronically Signed   By: Keith Rake M.D.   On: 06/04/2022  00:28   CT ABDOMEN PELVIS W CONTRAST  Result Date: 06/03/2022 CLINICAL DATA:  Vomiting and weakness. Bowel obstruction suspected. Patient reports a have not passed gas or stool for 7 days EXAM: CT ABDOMEN AND PELVIS WITH CONTRAST TECHNIQUE: Multidetector CT imaging of the abdomen and pelvis was performed using the standard protocol following bolus administration of intravenous contrast. RADIATION DOSE REDUCTION: This exam was performed according to the departmental dose-optimization program which includes automated exposure control, adjustment of the mA and/or kV according to patient size and/or use of iterative reconstruction technique. CONTRAST:  127m OMNIPAQUE IOHEXOL 300 MG/ML  SOLN COMPARISON:  CT 01/14/2019 FINDINGS: Lower chest: Mild cardiomegaly. Atelectasis in both lower lobes. No pleural effusion. Hepatobiliary: Mild subjective hepatic steatosis. Gallbladder physiologically distended, no calcified stone. No biliary dilatation. Pancreas: Fatty atrophy.  No ductal dilatation or inflammation. Spleen: Normal in size without focal abnormality. Adrenals/Urinary Tract: No adrenal nodule. Two punctate nonobstructing stones in the mid left kidney. No hydronephrosis. No focal renal lesion. Urinary bladder is partially distended, no wall thickening. Stomach/Bowel: Marked fluid distention of the stomach. Dilated fluid-filled proximal small bowel, greatest small bowel dimension 5.3 cm. There is no evidence of bowel pneumatosis. Mild mesenteric edema. Transition point in the right lower quadrant series 2, image 73. The distal small bowel is decompressed. There is fecalization of distal ileal small bowel contents. Large volume of stool in the ascending, transverse, and descending colon. Small to moderate stool in the sigmoid colon. There is small sigmoid redundancy. No abnormal rectal distention. No colonic inflammation or evidence of colonic mass. Vascular/Lymphatic: Mild aortic atherosclerosis and tortuosity.  Patent portal vein. No portal venous or mesenteric gas. No abdominopelvic adenopathy. Reproductive: Prostate is unremarkable. Other: No free air or ascites. Mild generalized body wall edema. No abdominal wall hernia. Musculoskeletal: Stable sclerotic focus within T9 vertebral body, likely bone island. Degenerative change in  the spine and both hips. There is fatty atrophy of the paraspinal musculature. IMPRESSION: 1. Small bowel obstruction with transition point in the right lower quadrant, likely due to adhesions. 2. Large volume of stool in the ascending, transverse, and descending colon, consistent with constipation. 3. Nonobstructing left nephrolithiasis. 4. Mild subjective hepatic steatosis. Aortic Atherosclerosis (ICD10-I70.0). Electronically Signed   By: Keith Rake M.D.   On: 06/03/2022 22:46     Assessment & Plan:  Colton Jackson. is a 71 y.o. male with a history of hypertension, GERD, OSA, and DM2 who presented to the hospital with abdominal pain and distension with nausea and vomiting concerning for small bowel obstruction.  Assessment  Per CT abdomen review and report there is a small bowel obstruction with transition point in the right lower quadrant most likely due to adhesions. There is evidence of marked fluid distension in the stomach and dilated fluid-filled proximal small bowel.  Given patient's imaging, clinical presentation including no bowel movement for about 7 days, and physical exam, patient likely has a small bowel obstruction. Leukocytosis on admission 7/19 (WBC 14.5) potentially secondary to SBO, WBC trending down today 7/20 (9.3 WBC).  Plan - PRN for pain and anti-emetics - NPO - Small bowel follow-through; KUB this evening and in the morning to evaluate obstruction - Suppository management - Possible surgery if obstruction persists overnight  All questions were answered to the satisfaction of the patient.  Yarlin Breisch (Max) Deshawn Skelley, Medical  Student 06/04/2022, 10:09 AM

## 2022-06-04 NOTE — H&P (Signed)
History and Physical    Patient: Colton Jackson. IEP:[id number] DOB: 05/17/1951 DOA: 06/03/2022 DOS: the patient was seen and examined on 06/04/2022 PCP: Sharilyn Sites, MD  Patient coming from: Home  Chief Complaint:  Chief Complaint  Patient presents with   Emesis   HPI: Colton Jackson. is a 71 y.o. male with medical history significant of neck artery aneurysm, hypertension, GERD, OSA, thyroid nodule, diabetes mellitus type 2, and more presents the ED with a chief complaint of abdominal distention and nausea vomiting.  Patient reports that his abdomen started getting distended the day prior to presentation.  On the day of presentation, he reports vomiting that first look like undigested food and then lifted and felt like feces.  Patient reports no blood in his emesis, but the NG tube does have blood in the output.  Patient reports that his last bowel movement was 1 week ago.  Approximately 6 weeks ago he had 5-6 days of diarrhea that spontaneously resolved.  Patient reports he has had abdominal pain for approximately 1 week that feels like fullness.  Reports has been progressively worse since it started.  He has never had symptoms like this before.  He does report chest pain that is associated with the abdomen.  He reports that it feels like indigestion coming up from his abdomen.  He has had dyspnea that is chronic for him.  He is dyspnea is worse with exertion.  He wears 4 L nasal cannula at home.  Patient denies any fevers, chills, or other symptoms.  Patient does not smoke, does not drink, does not use illicit drugs.  He is vaccinated for COVID.  Patient is full code. Review of Systems: As mentioned in the history of present illness. All other systems reviewed and are negative. Past Medical History:  Diagnosis Date   Allergic rhinitis    Aneurysm artery, neck (HCC)    [name] pulmonologist   Degenerative disk disease    Essential hypertension    GERD (gastroesophageal reflux disease)     H/O cardiovascular stress test 02/02/11   EF 59% - Normal stress test   Hearing deficit, right    Knee pain, bilateral    Lower back pain    Morbid obesity (HCC)    OSA (obstructive sleep apnea)    On CPAP   Thyroid nodule    Type 2 diabetes mellitus ([place])    Type 2 diabetes mellitus without complication ([place]) 6/60/6301   Qualifier: Diagnosis of  By: Annamaria Boots MD, Clinton D    Past Surgical History:  Procedure Laterality Date   APPENDECTOMY     CARDIAC CATHETERIZATION     Cataract surgery Bilateral    COLONOSCOPY WITH PROPOFOL N/A 08/01/2019   Procedure: COLONOSCOPY WITH PROPOFOL;  Surgeon: Aviva Signs, MD;  Location: [facility];  Service: General;  Laterality: N/A;   KNEE ARTHROSCOPY Bilateral    NASAL SEPTOPLASTY W/ TURBINOPLASTY     RIGHT/LEFT HEART CATH AND CORONARY ANGIOGRAPHY N/A 06/23/2018   Procedure: RIGHT/LEFT HEART CATH AND CORONARY ANGIOGRAPHY;  Surgeon: Larey Dresser, MD;  Location: [facility];  Service: Cardiovascular;  Laterality: N/A;   Social History:  reports that he quit smoking about 52 years ago. His smoking use included cigarettes. He has never used smokeless tobacco. He reports that he does not drink alcohol and does not use drugs.  Allergies  Allergen Reactions   Covid-19 Mrna Vaccine [employer]) [phone] Mrna Vacc (Moderna)] Other (See Comments)    Joint pain  and fatigue   Sulfa Antibiotics Swelling    eyelids    Family History  Problem Relation Age of Onset   Lung disease Father    Stroke Father 61   Diabetes Father    Diabetes Maternal Grandfather    Heart disease Paternal Grandmother    Stroke Paternal Grandfather 22   Diabetes Paternal Grandfather    Diabetes Mother    Cancer Mother    Stroke Mother 52   Diabetes Sister    Diabetes Brother 3    Prior to Admission medications   Medication Sig Start Date End Date Taking? Authorizing Provider  acetaminophen (TYLENOL) 500 MG tablet Take 500 mg by mouth 3 (three) times daily.    Yes [provider]  amLODipine (NORVASC) 10 MG tablet Take 1 tablet by mouth once daily Patient taking differently: Take 10 mg by mouth daily. 01/27/19  Yes Larey Dresser, MD  Cholecalciferol (VITAMIN D3) 10 MCG (400 UNIT) CAPS Take 1 capsule by mouth daily.   Yes [provider]  cloNIDine (CATAPRES) 0.2 MG tablet Take 1 tablet (0.2 mg total) by mouth 3 (three) times daily. 01/18/19  Yes Emokpae, Courage, MD  cromolyn (NASALCROM) 5.2 MG/ACT nasal spray Place 1 spray into both nostrils 3 (three) times daily as needed for allergies.    Yes [provider]  famotidine (PEPCID) 20 MG tablet Take 20 mg by mouth 2 (two) times daily.    Yes [provider]  furosemide (LASIX) 40 MG tablet Take 40 mg by mouth daily. 04/03/22  Yes [provider]  glipiZIDE (GLUCOTROL) 10 MG tablet Take 10 mg by mouth 2 (two) times daily.  06/26/19  Yes [provider]  HYDROcodone-acetaminophen (NORCO) 10-325 MG tablet Take 1 tablet by mouth 3 (three) times daily as needed for moderate pain.    Yes [provider]  Javier Docker Oil 500 MG CAPS Take 500 mg by mouth daily.    Yes [provider]  latanoprost (XALATAN) 0.005 % ophthalmic solution INSTILL 1 DROP IN BOTH EYES EVERY DAY AT BEDTIME- NEEDS TO FOLLOWUP WITH GLAUCOMA DOCTOR FOR REFILLS Patient taking differently: Place 1 drop into both eyes at bedtime. 03/24/21  Yes Rankin, Clent Demark, MD  losartan (COZAAR) 100 MG tablet Take 1 tablet (100 mg total) by mouth daily. 01/19/19  Yes Roxan Hockey, MD  meclizine (ANTIVERT) 25 MG tablet Take 25 mg by mouth 3 (three) times daily as needed for dizziness or nausea (when taking hydrocodone/apap).    Yes [provider]  meloxicam (MOBIC) 7.5 MG tablet Take 7.5 mg by mouth 2 (two) times daily. 05/22/19  Yes [provider]  metFORMIN (GLUCOPHAGE-XR) 500 MG 24 hr tablet Take 1,000 mg by mouth 2 (two) times daily. 12/19/18  Yes [provider]   metoprolol succinate (TOPROL-XL) 50 MG 24 hr tablet Take 1 tablet (50 mg total) by mouth daily. 01/19/19  Yes Emokpae, Courage, MD  mupirocin ointment (BACTROBAN) 2 % 1 application 2 (two) times daily.   Yes [provider]  OXYGEN Inhale 5 L/hr into the lungs daily.   Yes [provider]  psyllium (METAMUCIL) 58.6 % powder Take 1 packet by mouth 2 (two) times daily.    Yes [provider]  trospium (SANCTURA) 20 MG tablet Take 1 tablet (20 mg total) by mouth 2 (two) times daily. 03/11/22  Yes Irine Seal, MD  mirabegron ER (MYRBETRIQ) 50 MG TB24 tablet Take 1 tablet (50 mg total) by mouth daily. Patient not  taking: Reported on 06/03/2022 04/16/22   Irine Seal, MD  terbinafine (LAMISIL) 250 MG tablet Take 500 mg by mouth daily. Patient not taking: Reported on 06/03/2022 01/18/22   [provider]    Physical Exam: Vitals:   06/04/22 0130 06/04/22 0200 06/04/22 0230 06/04/22 0300  BP: 123/69 117/69 (!) 109/59 (!) 146/87  Pulse: 94 91 91 (!) 109  Resp: (!) 21 (!) 33 (!) 21 (!) 23  Temp:      TempSrc:      SpO2: 96% 92% 97% 100%  Height:       1.  General: Patient lying supine in bed,  no acute distress   2. Psychiatric: Alert and oriented x 3, mood and behavior normal for situation, pleasant and cooperative with exam   3. Neurologic: Speech and language are normal, face is symmetric, moves all 4 extremities voluntarily, at baseline without acute deficits on limited exam   4. HEENMT:  Head is atraumatic, normocephalic, pupils reactive to light, neck is supple, trachea is midline, mucous membranes are moist   5. Respiratory : Lungs are clear to auscultation bilaterally without wheezing, rhonchi, rales, no cyanosis, no increase in work of breathing or accessory muscle use   6. Cardiovascular : Heart rate normal, rhythm is regular, no murmurs, rubs or gallops, no peripheral edema, peripheral pulses palpated   7. Gastrointestinal:  Abdomen is soft,  diffusely tender to palpation without guarding bowel sounds active, no masses or organomegaly palpated, NG tube in place   8. Skin:  Skin is warm, dry and intact without rashes, acute lesions, or ulcers on limited exam   9.Musculoskeletal:  No acute deformities or trauma, no asymmetry in tone, no peripheral edema, peripheral pulses palpated, no tenderness to palpation in the extremities  Data Reviewed: In the ED Temp 98.1, heart rate 100-111, respiratory rate 10-40, blood pressure 107/55-174/109, satting 85-100% Leukocytosis 14.5, hemoglobin 13.1 Hypokalemia at 3.4 CT abdomen pelvis shows small bowel obstruction with right lower quadrant transition zone likely due to adhesions.  Patient does have a history of appendectomy as a child General surgery was consulted and recommends suppositories, NG tube, n.p.o. Admission was requested for small bowel obstruction management Assessment and Plan: * SBO (small bowel obstruction) ([place]) - General surgery aware and will see patient in the a.m. -N.p.o. -NG tube -Maintenance fluids -Pain control  Leukocytosis - White blood cell count 14.5 -Technically meets SIRS criteria, but likely from pain and not from infection -Leukocytosis is thought to be secondary to SBO -No antibiotics at this time -Add Pro-Cal to previous blood -Trend in the a.m.  Diabetes mellitus type 2 in obese (HCC) - Hold metformin and glipizide -Sliding scale coverage -Currently n.p.o. -Continue to monitor  Elevated serum creatinine - Likely at baseline, he was trending up through 2020 and we have not had a creatinine since that year -Continue IV fluids -Trend in the a.m.  Essential hypertension - Continue amlodipine, clonidine, losartan, metoprolol -Continue to monitor      Advance Care Planning:   Code Status: Prior   Consults: General surgery  Family Communication: No family at bedside  Severity of Illness: The appropriate patient status for this patient  is INPATIENT. Inpatient status is judged to be reasonable and necessary in order to provide the required intensity of service to ensure the patient's safety. The patient's presenting symptoms, physical exam findings, and initial radiographic and laboratory data in the context of their chronic comorbidities is felt to place them at high risk for further  clinical deterioration. Furthermore, it is not anticipated that the patient will be medically stable for discharge from the hospital within 2 midnights of admission.   * I certify that at the point of admission it is my clinical judgment that the patient will require inpatient hospital care spanning beyond 2 midnights from the point of admission due to high intensity of service, high risk for further deterioration and high frequency of surveillance required.*  Author: Rolla Plate, DO 06/04/2022 3:49 AM  For on call review [email or web address].

## 2022-06-04 NOTE — Hospital Course (Addendum)
Colton Jackson. is a 71 y.o. male with medical history significant of neck artery aneurysm, hypertension, GERD, OSA, thyroid nodule, diabetes mellitus type 2, and more presents the ED with a chief complaint of abdominal distention and nausea vomiting.   Patient reports that his abdomen started getting distended the day prior to presentation.  On the day of presentation, he reports vomiting that first look like undigested food and then lifted and felt like feces.  Patient reports no blood in his emesis, but the NG tube does have blood in the output.  Patient reports that his last bowel movement was 1 week ago.  Approximately 6 weeks ago he had 5-6 days of diarrhea that spontaneously resolved.  Patient reports he has had abdominal pain for approximately 1 week that feels like fullness.  Reports has been progressively worse since it started.  He has never had symptoms like this before.  He does report chest pain that is associated with the abdomen.  He reports that it feels like indigestion coming up from his abdomen.  He has had dyspnea that is chronic for him.  He is dyspnea is worse with exertion.  He wears 4 L nasal cannula at home.  Patient denies any fevers, chills, or other symptoms.   Patient does not smoke, does not drink, does not use illicit drugs.  He is vaccinated for COVID.  Patient is full code.  ED:  Temp 98.1, heart rate 100-111, respiratory rate 10-40, blood pressure 107/55-174/109, satting 85-100% Leukocytosis 14.5, hemoglobin 13.1 Hypokalemia at 3.4 CT abdomen pelvis shows small bowel obstruction with right lower quadrant transition zone likely due to adhesions.  Patient does have a history of appendectomy as a child General surgery was consulted and recommends suppositories, NG tube, n.p.o. Admission was requested for small bowel obstruction management

## 2022-06-05 ENCOUNTER — Encounter (HOSPITAL_COMMUNITY)
Admission: EM | Disposition: A | Discharge status: Home Health | Payer: Self-pay | Source: Home / Self Care | Attending: Family Medicine

## 2022-06-05 ENCOUNTER — Encounter (HOSPITAL_COMMUNITY): Payer: Self-pay | Admitting: Family Medicine

## 2022-06-05 ENCOUNTER — Inpatient Hospital Stay (HOSPITAL_COMMUNITY): Payer: Medicare Other

## 2022-06-05 DIAGNOSIS — Z01810 Encounter for preprocedural cardiovascular examination: Secondary | ICD-10-CM | POA: Diagnosis not present

## 2022-06-05 DIAGNOSIS — J9622 Acute and chronic respiratory failure with hypercapnia: Secondary | ICD-10-CM | POA: Diagnosis not present

## 2022-06-05 DIAGNOSIS — K56609 Unspecified intestinal obstruction, unspecified as to partial versus complete obstruction: Secondary | ICD-10-CM | POA: Diagnosis not present

## 2022-06-05 DIAGNOSIS — J9621 Acute and chronic respiratory failure with hypoxia: Secondary | ICD-10-CM | POA: Diagnosis present

## 2022-06-05 DIAGNOSIS — I272 Pulmonary hypertension, unspecified: Secondary | ICD-10-CM | POA: Diagnosis not present

## 2022-06-05 LAB — COMPREHENSIVE METABOLIC PANEL
ALT: 14 U/L (ref 0–44)
AST: 23 U/L (ref 15–41)
Albumin: 3.6 g/dL (ref 3.5–5.0)
Alkaline Phosphatase: 53 U/L (ref 38–126)
Anion gap: 15 (ref 5–15)
BUN: 21 mg/dL (ref 8–23)
CO2: 31 mmol/L (ref 22–32)
Calcium: 7.8 mg/dL — ABNORMAL LOW (ref 8.9–10.3)
Chloride: 99 mmol/L (ref 98–111)
Creatinine, Ser: 1.33 mg/dL — ABNORMAL HIGH (ref 0.61–1.24)
GFR, Estimated: 58 mL/min — ABNORMAL LOW (ref >60)
Glucose, Bld: 184 mg/dL — ABNORMAL HIGH (ref 70–99)
Potassium: 3.5 mmol/L (ref 3.5–5.1)
Sodium: 145 mmol/L (ref 135–145)
Total Bilirubin: 1.3 mg/dL — ABNORMAL HIGH (ref 0.3–1.2)
Total Protein: 7.5 g/dL (ref 6.5–8.1)

## 2022-06-05 LAB — CBC
HCT: 39.5 % (ref 39.0–52.0)
Hemoglobin: 12.6 g/dL — ABNORMAL LOW (ref 13.0–17.0)
MCH: 31.8 pg (ref 26.0–34.0)
MCHC: 31.9 g/dL (ref 30.0–36.0)
MCV: 99.7 fL (ref 80.0–100.0)
Platelets: 262 10*3/uL (ref 150–400)
RBC: 3.96 MIL/uL — ABNORMAL LOW (ref 4.22–5.81)
RDW: 13 % (ref 11.5–15.5)
WBC: 8.7 10*3/uL (ref 4.0–10.5)
nRBC: 0 % (ref 0.0–0.2)

## 2022-06-05 LAB — GLUCOSE, CAPILLARY
Glucose-Capillary: 176 mg/dL — ABNORMAL HIGH (ref 70–99)
Glucose-Capillary: 202 mg/dL — ABNORMAL HIGH (ref 70–99)
Glucose-Capillary: 220 mg/dL — ABNORMAL HIGH (ref 70–99)
Glucose-Capillary: 220 mg/dL — ABNORMAL HIGH (ref 70–99)

## 2022-06-05 LAB — BLOOD GAS, VENOUS
Acid-Base Excess: 9.9 mmol/L — ABNORMAL HIGH (ref 0.0–2.0)
Bicarbonate: 36.8 mmol/L — ABNORMAL HIGH (ref 20.0–28.0)
Drawn by: 27160
O2 Saturation: 27.6 %
Patient temperature: 36.5
pCO2, Ven: 57 mmHg (ref 44–60)
pH, Ven: 7.42 (ref 7.25–7.43)
pO2, Ven: <31 mmHg — CL (ref 32–45)

## 2022-06-05 LAB — LACTIC ACID, PLASMA: Lactic Acid, Venous: 1.5 mmol/L (ref 0.5–1.9)

## 2022-06-05 LAB — MRSA NEXT GEN BY PCR, NASAL: MRSA by PCR Next Gen: NOT DETECTED

## 2022-06-05 LAB — BRAIN NATRIURETIC PEPTIDE: B Natriuretic Peptide: 291 pg/mL — ABNORMAL HIGH (ref 0.0–100.0)

## 2022-06-05 SURGERY — LAPAROTOMY, EXPLORATORY
Anesthesia: General

## 2022-06-05 MED ORDER — PANTOPRAZOLE SODIUM 40 MG IV SOLR
40.0000 mg | Freq: Every day | INTRAVENOUS | Status: DC
Start: 2022-06-05 — End: 2022-06-08
  Administered 2022-06-05 – 2022-06-08 (×4): 40 mg via INTRAVENOUS
  Filled 2022-06-05 (×4): qty 10

## 2022-06-05 MED ORDER — FUROSEMIDE 10 MG/ML IJ SOLN
40.0000 mg | Freq: Once | INTRAMUSCULAR | Status: AC
  Administered 2022-06-05: 40 mg via INTRAVENOUS
  Filled 2022-06-05: qty 4

## 2022-06-05 MED ORDER — CLONIDINE HCL 0.2 MG PO TABS
0.2000 mg | ORAL_TABLET | Freq: Three times a day (TID) | ORAL | Status: DC
  Administered 2022-06-05 – 2022-06-07 (×5): 0.2 mg via ORAL
  Filled 2022-06-05 (×5): qty 1

## 2022-06-05 MED ORDER — SENNOSIDES 8.8 MG/5ML PO SYRP
5.0000 mL | ORAL_SOLUTION | Freq: Two times a day (BID) | ORAL | Status: DC
  Administered 2022-06-06 – 2022-06-07 (×4): 5 mL via ORAL
  Filled 2022-06-05 (×8): qty 5

## 2022-06-05 MED ORDER — LOSARTAN POTASSIUM 50 MG PO TABS: 100.0000 mg | ORAL_TABLET | Freq: Every day | ORAL | Status: DC

## 2022-06-05 MED ORDER — FAMOTIDINE IN NACL 20-0.9 MG/50ML-% IV SOLN
20.0000 mg | Freq: Two times a day (BID) | INTRAVENOUS | Status: DC
  Administered 2022-06-05 – 2022-06-08 (×6): 20 mg via INTRAVENOUS
  Filled 2022-06-05 (×6): qty 50

## 2022-06-05 MED ORDER — CHLORHEXIDINE GLUCONATE CLOTH 2 % EX PADS
6.0000 | MEDICATED_PAD | Freq: Every day | CUTANEOUS | Status: DC
Start: 2022-06-05 — End: 2022-06-10
  Administered 2022-06-05 – 2022-06-10 (×6): 6 via TOPICAL

## 2022-06-05 MED ORDER — OXYCODONE HCL 5 MG PO TABS: 5.0000 mg | ORAL_TABLET | ORAL | Status: DC | PRN

## 2022-06-05 MED ORDER — INSULIN DETEMIR 100 UNIT/ML ~~LOC~~ SOLN
5.0000 [IU] | Freq: Two times a day (BID) | SUBCUTANEOUS | Status: DC
  Administered 2022-06-05 (×2): 5 [IU] via SUBCUTANEOUS
  Filled 2022-06-05 (×3): qty 0.05

## 2022-06-05 MED ORDER — LEVALBUTEROL HCL 0.63 MG/3ML IN NEBU: 0.6300 mg | INHALATION_SOLUTION | Freq: Four times a day (QID) | RESPIRATORY_TRACT | Status: DC | PRN

## 2022-06-05 NOTE — Progress Notes (Signed)
   06/05/22 1230  Vitals  Temp 98.5 F (36.9 C)  Temp Source Oral  BP 130/72  MAP (mmHg) 88  BP Location Left Arm  BP Method Automatic  Patient Position (if appropriate) Sitting  Pulse Rate (!) 124  Pulse Rate Source Apical  Resp (!) 36  Level of Consciousness  Level of Consciousness Alert  MEWS COLOR  MEWS Score Color Red  Oxygen Therapy  SpO2 93 %  O2 Device HFNC  O2 Flow Rate (L/min) 6 L/min  Pain Assessment  Pain Scale 0-10  Pain Score 0  MEWS Score  MEWS Temp 0  MEWS Systolic 0  MEWS Pulse 2  MEWS RR 3  MEWS LOC 0  MEWS Score 5   Pt remains up in recliner, states feels a lot better, states not nearly as short of breath. Remains tachypneic, breath sounds continue with crackles bilaterally mid chest to bases. HR remains elevated, ST 124-126,  denies any chest pain. Pt has put out an additional 451m of clear yellow urine since last dose of lasix admin'd. Instructed on purse-lipped breathing to slow respirations and improve lung aearation. Pt did fair on return demonstration, will require encouragement. [facility] status and VS.

## 2022-06-05 NOTE — Progress Notes (Signed)
MD Wert in to evaluate pt.

## 2022-06-05 NOTE — Progress Notes (Signed)
PROGRESS NOTE    Patient: Colton Jackson.                            PCP: Sharilyn Sites, MD                    DOB: May 28, 1951            DOA: 06/03/2022 JXB:[id number]             DOS: 06/05/2022, 11:04 AM   LOS: 2 days   Date of Service: The patient was seen and examined on 06/05/2022  Subjective:   The patient was seen and examined this morning, in respiratory distress, O2 demand increased from 6 to 11 Ls by nasal cannula Complaining of shortness of breath satting 92% Tachypneic with heart rate 123, respiratory rate as high as 40, temp 99 Awake alert following commands  Apparently had gas and bowel movements x2 over past 12 hrs    Brief Narrative:   Marquavious Nazar. is a 71 y.o. male with medical history significant of neck artery aneurysm, hypertension, GERD, OSA, thyroid nodule, diabetes mellitus type 2, and more presents the ED with a chief complaint of abdominal distention and nausea vomiting.   Patient reports that his abdomen started getting distended the day prior to presentation.  On the day of presentation, he reports vomiting that first look like undigested food and then lifted and felt like feces.  Patient reports no blood in his emesis, but the NG tube does have blood in the output.  Patient reports that his last bowel movement was 1 week ago.  Approximately 6 weeks ago he had 5-6 days of diarrhea that spontaneously resolved.  Patient reports he has had abdominal pain for approximately 1 week that feels like fullness.  Reports has been progressively worse since it started.  He has never had symptoms like this before.  He does report chest pain that is associated with the abdomen.  He reports that it feels like indigestion coming up from his abdomen.  He has had dyspnea that is chronic for him.  He is dyspnea is worse with exertion.  He wears 4 L nasal cannula at home.  Patient denies any fevers, chills, or other symptoms.   Patient does not smoke, does not drink, does not use  illicit drugs.  He is vaccinated for COVID.  Patient is full code.  ED:  Temp 98.1, heart rate 100-111, respiratory rate 10-40, blood pressure 107/55-174/109, satting 85-100% Leukocytosis 14.5, hemoglobin 13.1 Hypokalemia at 3.4 CT abdomen pelvis shows small bowel obstruction with right lower quadrant transition zone likely due to adhesions.  Patient does have a history of appendectomy as a child General surgery was consulted and recommends suppositories, NG tube, n.p.o. Admission was requested for small bowel obstruction management    Assessment & Plan:   Principal Problem:   SBO (small bowel obstruction) (HCC) Active Problems:   Acute on chronic respiratory failure with hypoxia (HCC)   Essential hypertension   Elevated serum creatinine   Diabetes mellitus type 2 in obese (HCC)   Leukocytosis     Assessment and Plan: * SBO (small bowel obstruction) ([place]) - General surgery following closely -Patient had gas and 2 bowel movements in the past 12 hours -KUB this a.m. revealing contrast in colon  -N.p.o. -NG tube --to be discontinued today -Discontinuing IV fluids..  As patient's shortness of breath is getting worse, volume overload  -As  needed analgesics -Hemoccult also positive, monitoring closely  Acute on chronic respiratory failure with hypoxia (HCC) - Baseline O2 demand 6 L -She is not requiring 11 high flow oxygen, satting 92% -Likely worsening shortness of breath and hypoxia due to volume overload is IV fluid was given as patient was n.p.o. for SBO--- also abdominal distention due to SBO  -IV fluids discontinued, dose of Lasix was initiated Chest x-ray obtained: Revealing;  cardiomegaly and vascular congestion. 2. Streaky density in the bilateral lungs which could be atelectasis or infection :  Leukocytosis - White blood cell count 14.5 >> 9.3 >>8.7  -Technically meets SIRS criteria, but likely from pain and not from infection, afebrile, -Leukocytosis is  thought to be secondary to SBO -No antibiotics at this time -Monitoring closely  Diabetes mellitus type 2 in obese (HCC) - Hold metformin and glipizide CBG (last 3)  Recent Labs    06/04/22 1556 06/04/22 2043 06/05/22 0741  GLUCAP 180* 151* 176*    -Checking CBG every 4-6 hours forward SSI coverage -N.p.o. -Continue to monitor -Adding long-acting insulin  Elevated serum creatinine - Likely at baseline, he was trending up through 2020  -Continue IV fluids -Trending creatinine 1.27, 1.23, 1.33  today    Essential hypertension - Continue amlodipine, clonidine, metoprolol When tolerating p.o. -Holding losartan -Continue to monitor -Stable, as needed hydralazine    ----------------------------------------------------------------------------------------------------------------------------------------------- Obesity - nutritional status:  The patient's BMI is: Body mass index is 44.44 kg/m. I agree with the assessment and plan as outlined  Currently n.p.o. --------------------------------------------------------------------------------------------------------------------------------------------  DVT prophylaxis:  heparin injection 5,000 Units Start: 06/04/22 0600 SCDs Start: 06/04/22 0406   Code Status:   Code Status: Full Code  Family Communication: No family member present at bedside- attempt will be made to update daily The above findings and plan of care has been discussed with patient (and family)  in detail,  they expressed understanding and agreement of above. -Advance care planning has been discussed.   Admission status:   Status is: Inpatient Remains inpatient appropriate because: Needing surgical management including NG tube, IV fluids, surgery evaluation for possible intervention     Procedures:   No admission procedures for hospital encounter.   Antimicrobials:  Anti-infectives (From admission, onward)    None        Medication:    amLODipine  10 mg Per Tube Daily   bisacodyl  10 mg Rectal Once   bisacodyl  10 mg Rectal BID   cloNIDine  0.2 mg Per Tube TID   cromolyn  1 spray Each Nare TID   heparin  5,000 Units Subcutaneous Q8H   insulin aspart  0-15 Units Subcutaneous TID WC   insulin aspart  0-5 Units Subcutaneous QHS   insulin detemir  5 Units Subcutaneous BID   [START ON 06/06/2022] losartan  100 mg Per Tube Daily   pantoprazole (PROTONIX) IV  40 mg Intravenous Daily    acetaminophen **OR** acetaminophen, hydrALAZINE, levalbuterol, morphine injection, ondansetron **OR** ondansetron (ZOFRAN) IV, oxyCODONE, phenol   Objective:   Vitals:   06/05/22 0746 06/05/22 0800 06/05/22 0848 06/05/22 1019  BP: (!) 142/75  129/73 138/71  Pulse: (!) 121 (!) 120 (!) 120 (!) 123  Resp: (!) 35 (!) 36 (!) 40 (!) 40  Temp: 99.4 F (37.4 C)  99 F (37.2 C) 99 F (37.2 C)  TempSrc:   Oral Oral  SpO2: 96% 98% 92% 92%  Weight:      Height:        Intake/Output Summary (  Last 24 hours) at 06/05/2022 1104 Last data filed at 06/05/2022 0930 Gross per 24 hour  Intake --  Output 1400 ml  Net -1400 ml   Filed Weights   06/04/22 0601  Weight: 128.7 kg     Examination:      Physical Exam:   General:  NG tube in place - AAO x 3,  cooperative, no distress; with shortness of breath  HEENT:  Normocephalic, PERRL, otherwise with in Normal limits   Neuro:  CNII-XII intact. , normal motor and sensation, reflexes intact   Lungs:   Clear to auscultation BL, Respirations unlabored,  No wheezes /positive for Rales and crackles mid, lower lobes  Cardio:    S1/S2, RRR, No murmure, No Rubs or Gallops   Abdomen:  Distended  but soft, non-tender, proactive bowel sounds but present no guarding or peritoneal signs.  Muscular  skeletal:  Limited exam -global generalized weaknesses - in bed, able to move all 4 extremities,   2+ pulses,  symmetric, +_2 pitting edema  Skin:  Dry, warm to touch, negative for any Rashes,  Wounds:  Please see nursing documentation         ------------------------------------------------------------------------------------------------------------------------------------------    LABs:     Latest Ref Rng & Units 06/05/2022    6:47 AM 06/04/2022    4:32 AM 06/03/2022    5:51 PM  CBC  WBC 4.0 - 10.5 K/uL 8.7  9.3  14.5   Hemoglobin 13.0 - 17.0 g/dL 12.6  12.2  13.1   Hematocrit 39.0 - 52.0 % 39.5  36.4  39.6   Platelets 150 - 400 K/uL 262  254  275       Latest Ref Rng & Units 06/05/2022    6:47 AM 06/04/2022    4:32 AM 06/03/2022    5:51 PM  CMP  Glucose 70 - 99 mg/dL 184  191  183   BUN 8 - 23 mg/dL _0 Creatinine 0.61 - 1.24 mg/dL 1.33  1.23  1.27   Sodium 135 - 145 mmol/L 145  141  142   Potassium 3.5 - 5.1 mmol/L 3.5  3.6  3.4   Chloride 98 - 111 mmol/L 99  95  94   CO2 22 - 32 mmol/L 31  32  33   Calcium 8.9 - 10.3 mg/dL 7.8  8.4  9.0   Total Protein 6.5 - 8.1 g/dL 7.5  7.6  8.6   Total Bilirubin 0.3 - 1.2 mg/dL 1.3  0.6  0.8   Alkaline Phos 38 - 126 U/L 53  50  59   AST 15 - 41 U/L _1 ALT 0 - 44 U/L _2 Micro Results No results found for this or any previous visit (from the past 240 hour(s)).  Radiology Reports DG Abd 1 View  Result Date: 06/05/2022 CLINICAL DATA:  Follow-up SBO EXAM: ABDOMEN - 1 VIEW COMPARISON:  Abdominal radiograph dated June 12, 2022 FINDINGS: Enteric tube tip and side port project over the stomach. Contrast material is seen throughout the colon. Mildly distended gas-filled loops of small bowel are seen in the right hemiabdomen. No contrast is seen in the rectum. IMPRESSION: Contrast material seen in the colon. Electronically Signed   By: Yetta Glassman M.D.   On: 06/05/2022 08:27   DG Chest Port 1 View  Result Date: 06/05/2022 CLINICAL DATA:  Hypoxia EXAM: PORTABLE  CHEST 1 VIEW COMPARISON:  06/04/2022 FINDINGS: Enteric tube reaches the stomach at least. Low volume chest with indistinct density in the  bilateral lungs. Cardiomegaly and vascular pedicle widening. No visible effusion or pneumothorax. IMPRESSION: 1. Cardiomegaly and vascular congestion. 2. Streaky density in the bilateral lungs which could be atelectasis or infection. Electronically Signed   By: Jorje Guild M.D.   On: 06/05/2022 05:46   DG Abd Portable 1V-Small Bowel Obstruction Protocol-initial, 8 hr delay  Result Date: 06/04/2022 CLINICAL DATA:  8 hour delayed film for small-bowel follow-through. EXAM: PORTABLE ABDOMEN - 1 VIEW COMPARISON:  CT yesterday. FINDINGS: Tip and side port of the enteric tube below the diaphragm in the stomach. Stomach is distended with enteric contrast. There is no contrast in the colon. Persistent gaseous distention of small bowel in the central abdomen. IMPRESSION: 1. No contrast in the colon. Persistent small bowel obstruction with gaseous small bowel distension. 2. Gastric distension with a large amount of residual contrast in the stomach. 3. Recommend 24 hour film. Electronically Signed   By: Keith Rake M.D.   On: 06/04/2022 18:56    SIGNED: Deatra Ewell, MD, FHM. [facility],  Pager (please use [email or web address] to page/text) Please use Epic Secure Chat for non-urgent communication (7AM-7PM)  If 7PM-7AM, please contact night-coverage [email or web address], 06/05/2022, 11:04 AM

## 2022-06-05 NOTE — Consult Note (Signed)
NAME:  Colton Dakin., MRN:  [id number], DOB:  1951-06-07, LOS: 2 ADMISSION DATE:  06/03/2022, CONSULTATION DATE:  06/05/22 REFERRING MD:  [facility] , CHIEF COMPLAINT:  sob    History of Present Illness:  60 ywobm remote smoker  with medical history significant of neck artery aneurysm, hypertension, GERD, OSA/OHS , thyroid nodule, diabetes mellitus type 2, and more presents the ED with a chief complaint of abdominal distention and nausea vomiting.  Patient reports that  abdomen pain x 1 week and noted started getting distended the day prior to admit   with onset of feculent vomitus.     Patient reported that his last bowel movement was 1 week PTA  Approximately 6 weeks PTA he had 5-6 days of diarrhea that spontaneously resolved   Chronically  he has had doe x room to room on 4lpm rest and up to 6 lpm with ex and also on cpap per Dr Annamaria Boots at hs.  Prior pfts reviewed and show only retriction, no  significant obstruction.   PCCM consulted am 7/21 p developing worse sob at rest in setting of worse abd distention/ pain - no pleuritic cp/ no cough and he does not think he choked on vomit earlier in admit.   Significant Hospital Events: Including procedures, antibiotic start and stop dates in addition to other pertinent events   CT abd 7/19 1. Small bowel obstruction with transition point in the right lower quadrant, likely due to adhesions.Large volume of stool in the ascending, transverse, and descending colon, consistent with constipation.Nonobstructing left nephrolithiasis. . Mild subjective hepatic steatosis.   Scheduled Meds:  amLODipine  10 mg Per Tube Daily   bisacodyl  10 mg Rectal Once   bisacodyl  10 mg Rectal BID   cloNIDine  0.2 mg Per Tube TID   cromolyn  1 spray Each Nare TID   heparin  5,000 Units Subcutaneous Q8H   insulin aspart  0-15 Units Subcutaneous TID WC   insulin aspart  0-5 Units Subcutaneous QHS   insulin detemir  5 Units Subcutaneous BID   [START ON 06/06/2022] losartan   100 mg Per Tube Daily   pantoprazole (PROTONIX) IV  40 mg Intravenous Daily   sennosides  5 mL Oral BID   Continuous Infusions: PRN Meds:.acetaminophen **OR** acetaminophen, hydrALAZINE, levalbuterol, morphine injection, ondansetron **OR** ondansetron (ZOFRAN) IV, oxyCODONE, phenol  Interim History / Subjective:  Sitting up in chair with 02 needs improving somewhat as abd distention relieved by NG Says able to pass some gas this am and pain is less  Objective   Blood pressure 130/72, pulse (!) 124, temperature 98.5 F (36.9 C), temperature source Oral, resp. rate (!) 36, height 5' 7" (1.702 m), weight 128.7 kg, SpO2 93 %.        Intake/Output Summary (Last 24 hours) at 06/05/2022 1349 Last data filed at 06/05/2022 1200 Gross per 24 hour  Intake --  Output 1800 ml  Net -1800 ml   Filed Weights   06/04/22 0614  Weight: 128.7 kg    Examination: Tmax:  99.4 General appearance:    obese bm looks uncomfortable sitting in chair but no acute distress   At Rest 02 sats  93% on 6lpm   No jvd Oropharynx clear,  mucosa nl Neck supple Lungs with decreased bs / min crackles bases bilaterally RRR no s3 or or sign murmur Abd obese with very limited  excursion / mild gen tenderness s guarding Extr warm with  1+ bitting bilateral edema -  no clubbing noted Neuro  Sensorium intact,  no apparent motor deficits      I personally reviewed images and agree with radiology impression as follows:  CXR:   portable 7/21 1. Cardiomegaly and vascular congestion. 2. Streaky density in the bilateral lungs which could be atelectasis or infection.    Assessment & Plan:  1)  Acute on chronic hypoxemic/ hypercarbic Resp failure secondary to OHS plus CHF exac by very low Cabd due to obesity with superimposed SBO which is starting to improve >>> adjust 02 to low 90s >>> noct cpap as at home  Risk of aspiration but should be better now that abd distention improving and no longer N or V   2) Morbid  obesity with restrictive PFTS  as of 12/18/11  >>> no evidence of airflow obst  3) diastolic dysfunction Duanne Limerick worse with  IV fluids/ now diuresing   Best Practice (right click and "Reselect all SmartList Selections" daily)    Per [facility]   Labs   CBC: Recent Labs  Lab 06/03/22 1751 06/04/22 0432 06/05/22 0647  WBC 14.5* 9.3 8.7  NEUTROABS 13.2* 7.6  --   HGB 13.1 12.2* 12.6*  HCT 39.6 36.4* 39.5  MCV 96.1 96.0 99.7  PLT 275 254 224    Basic Metabolic Panel: Recent Labs  Lab 06/03/22 1751 06/04/22 0432 06/05/22 0647  NA 142 141 145  K 3.4* 3.6 3.5  CL 94* 95* 99  CO2 33* 32 31  GLUCOSE 183* 191* 184*  BUN _0 CREATININE 1.27* 1.23 1.33*  CALCIUM 9.0 8.4* 7.8*  MG  --  1.4*  --    GFR: Estimated Creatinine Clearance: 67.5 mL/min (A) (by C-G formula based on SCr of 1.33 mg/dL (H)). Recent Labs  Lab 06/03/22 1751 06/04/22 0100 06/04/22 0432 06/05/22 0647 06/05/22 0833  PROCALCITON  --  7.86  --   --   --   WBC 14.5*  --  9.3 8.7  --   LATICACIDVEN  --   --   --   --  1.5    Liver Function Tests: Recent Labs  Lab 06/03/22 1751 06/04/22 0432 06/05/22 0647  AST _1 ALT _2 ALKPHOS 59 50 53  BILITOT 0.8 0.6 1.3*  PROT 8.6* 7.6 7.5  ALBUMIN 4.5 3.8 3.6   No results for input(s): "LIPASE", "AMYLASE" in the last 168 hours. No results for input(s): "AMMONIA" in the last 168 hours.  ABG    Component Value Date/Time   HCO3 36.8 (H) 06/05/2022 0648   TCO2 29 06/23/2018 0810   TCO2 30 06/23/2018 0810   O2SAT 27.6 06/05/2022 0648     Coagulation Profile: No results for input(s): "INR", "PROTIME" in the last 168 hours.  Cardiac Enzymes: No results for input(s): "CKTOTAL", "CKMB", "CKMBINDEX", "TROPONINI" in the last 168 hours.  HbA1C: Hgb A1c MFr Bld  Date/Time Value Ref Range Status  06/04/2022 04:32 AM 6.9 (H) 4.8 - 5.6 % Final    Comment:    (NOTE) Pre diabetes:          5.7%-6.4%  Diabetes:              >6.4%  Glycemic  control for   <7.0% adults with diabetes     CBG: Recent Labs  Lab 06/04/22 1105 06/04/22 1556 06/04/22 2043 06/05/22 0741 06/05/22 1135  GLUCAP 161* 180* 151* 176* 202*       Past Medical History:  He,  has  a past medical history of Allergic rhinitis, Aneurysm artery, neck ([place]), Degenerative disk disease, Essential hypertension, GERD (gastroesophageal reflux disease), H/O cardiovascular stress test (02/02/11), Hearing deficit, right, Knee pain, bilateral, Lower back pain, Morbid obesity ([place]), OSA (obstructive sleep apnea), Thyroid nodule, Type 2 diabetes mellitus ([place]), and Type 2 diabetes mellitus without complication ([place]) (7/32/2025).   Surgical History:   Past Surgical History:  Procedure Laterality Date   APPENDECTOMY     CARDIAC CATHETERIZATION     Cataract surgery Bilateral    COLONOSCOPY WITH PROPOFOL N/A 08/01/2019   Procedure: COLONOSCOPY WITH PROPOFOL;  Surgeon: Aviva Signs, MD;  Location: [facility];  Service: General;  Laterality: N/A;   KNEE ARTHROSCOPY Bilateral    NASAL SEPTOPLASTY W/ TURBINOPLASTY     RIGHT/LEFT HEART CATH AND CORONARY ANGIOGRAPHY N/A 06/23/2018   Procedure: RIGHT/LEFT HEART CATH AND CORONARY ANGIOGRAPHY;  Surgeon: Larey Dresser, MD;  Location: [facility];  Service: Cardiovascular;  Laterality: N/A;     Social History:   reports that he quit smoking about 52 years ago. His smoking use included cigarettes. He has never used smokeless tobacco. He reports that he does not drink alcohol and does not use drugs.   Family History:  His family history includes Cancer in his mother; Diabetes in his father, maternal grandfather, mother, paternal grandfather, and sister; Diabetes (age of onset: 18) in his brother; Heart disease in his paternal grandmother; Lung disease in his father; Stroke (age of onset: 15) in his father; Stroke (age of onset: 60) in his paternal grandfather; Stroke (age of onset: 72) in his mother.    Allergies Allergies  Allergen Reactions   Covid-19 Mrna Vaccine [employer]) [Covid-19 Mrna Vacc (Moderna)] Other (See Comments)    Joint pain and fatigue   Sulfa Antibiotics Swelling    eyelids     Home Medications  Prior to Admission medications   Medication Sig Start Date End Date Taking? Authorizing Provider  acetaminophen (TYLENOL) 500 MG tablet Take 500 mg by mouth 3 (three) times daily.   Yes [provider]  amLODipine (NORVASC) 10 MG tablet Take 1 tablet by mouth once daily Patient taking differently: Take 10 mg by mouth daily. 01/27/19  Yes Larey Dresser, MD  Cholecalciferol (VITAMIN D3) 10 MCG (400 UNIT) CAPS Take 1 capsule by mouth daily.   Yes [provider]  cloNIDine (CATAPRES) 0.2 MG tablet Take 1 tablet (0.2 mg total) by mouth 3 (three) times daily. 01/18/19  Yes Emokpae, Courage, MD  cromolyn (NASALCROM) 5.2 MG/ACT nasal spray Place 1 spray into both nostrils 3 (three) times daily as needed for allergies.    Yes [provider]  famotidine (PEPCID) 20 MG tablet Take 20 mg by mouth 2 (two) times daily.    Yes [provider]  furosemide (LASIX) 40 MG tablet Take 40 mg by mouth daily. 04/03/22  Yes [provider]  glipiZIDE (GLUCOTROL) 10 MG tablet Take 10 mg by mouth 2 (two) times daily.  06/26/19  Yes [provider]  HYDROcodone-acetaminophen (NORCO) 10-325 MG tablet Take 1 tablet by mouth 3 (three) times daily as needed for moderate pain.    Yes [provider]  Javier Docker Oil 500 MG CAPS Take 500 mg by mouth daily.    Yes [provider]  latanoprost (XALATAN) 0.005 % ophthalmic solution INSTILL 1 DROP IN BOTH EYES EVERY DAY AT BEDTIME- NEEDS TO FOLLOWUP WITH GLAUCOMA DOCTOR FOR REFILLS Patient taking differently: Place 1 drop into both eyes  at bedtime. 03/24/21  Yes Rankin, Clent Demark, MD  losartan (COZAAR) 100 MG tablet Take 1 tablet (100 mg total) by mouth daily. 01/19/19  Yes Roxan Hockey, MD  meclizine  (ANTIVERT) 25 MG tablet Take 25 mg by mouth 3 (three) times daily as needed for dizziness or nausea (when taking hydrocodone/apap).    Yes [provider]  meloxicam (MOBIC) 7.5 MG tablet Take 7.5 mg by mouth 2 (two) times daily. 05/22/19  Yes [provider]  metFORMIN (GLUCOPHAGE-XR) 500 MG 24 hr tablet Take 1,000 mg by mouth 2 (two) times daily. 12/19/18  Yes [provider]  metoprolol succinate (TOPROL-XL) 50 MG 24 hr tablet Take 1 tablet (50 mg total) by mouth daily. 01/19/19  Yes Emokpae, Courage, MD  mupirocin ointment (BACTROBAN) 2 % 1 application 2 (two) times daily.   Yes [provider]  OXYGEN Inhale 5 L/hr into the lungs daily.   Yes [provider]  psyllium (METAMUCIL) 58.6 % powder Take 1 packet by mouth 2 (two) times daily.    Yes [provider]  trospium (SANCTURA) 20 MG tablet Take 1 tablet (20 mg total) by mouth 2 (two) times daily. 03/11/22  Yes Irine Seal, MD  mirabegron ER (MYRBETRIQ) 50 MG TB24 tablet Take 1 tablet (50 mg total) by mouth daily. Patient not taking: Reported on 06/03/2022 04/16/22   Irine Seal, MD  terbinafine (LAMISIL) 250 MG tablet Take 500 mg by mouth daily. Patient not taking: Reported on 06/03/2022 01/18/22   [provider]       The patient is critically ill with multiple organ systems failure and requires high complexity decision making for assessment and support, frequent evaluation and titration of therapies, application of advanced monitoring technologies and extensive interpretation of multiple databases. Critical Care Time devoted to patient care services described in this note is 45 minutes.   Christinia Gully, MD [facility] [place] [phone]   After 7:00 pm call Elink  [phone]

## 2022-06-05 NOTE — Progress Notes (Signed)
0800: Pt sitting up on side of bed, resp labored at 36/min, SaO2 96% on 10 lpm HFNC. Crackles noted bilaterally from bases to mid-chest. HR 122/min, regular. Pt with +3 edema to bilateral lower legs. NGT intact with green colored drainage. MD Shahmedhi states on his way to evaluate pt. Radiology in to obtain abd x-ray. Pt able to lie flat on bed for short period of time for x-ray but c/o increased SOB. Pt assisted up to recliner, able to stand and take several small steps without difficulty.  Lasix IV administered per order, malewick applied for accurate urine output measurement. MD Shahmedhi in to evaluate pt, discussed xrays results with pt and advised of current plan of treatment. Pt asked that MD talk with his daughter to update on current condition and plan of care.

## 2022-06-05 NOTE — Progress Notes (Signed)
RECEIVED PT FROM 3A

## 2022-06-05 NOTE — Progress Notes (Signed)
[facility] Progress Note  Day of Surgery  Subjective: Hd Bms and KUB with contrast throughout colon.   Objective: Vital signs in last 24 hours: Temp:  [98 F (36.7 C)-99.4 F (37.4 C)] 99 F (37.2 C) (07/21 1019) Pulse Rate:  [110-123] 123 (07/21 1019) Resp:  [18-40] 40 (07/21 1019) BP: (129-143)/(71-84) 138/71 (07/21 1019) SpO2:  [91 %-99 %] 92 % (07/21 1019) Last BM Date : 06/05/22 (Pt.had very large BM)  Intake/Output from previous day: 07/20 0701 - 07/21 0700 In: -  Out: 650 [Urine:550; Emesis/NG output:100] Intake/Output this shift: Total I/O In: -  Out: 1100 [Urine:700; Emesis/NG output:400]  General appearance: alert and no distress GI: soft, distended, nontender   Lab Results:  Recent Labs    06/04/22 0432 06/05/22 0647  WBC 9.3 8.7  HGB 12.2* 12.6*  HCT 36.4* 39.5  PLT 254 262   BMET Recent Labs    06/04/22 0432 06/05/22 0647  NA 141 145  K 3.6 3.5  CL 95* 99  CO2 32 31  GLUCOSE 191* 184*  BUN 19 21  CREATININE 1.23 1.33*  CALCIUM 8.4* 7.8*   PT/INR No results for input(s): "LABPROT", "INR" in the last 72 hours.  Studies/Results: DG Abd 1 View  Result Date: 06/05/2022 CLINICAL DATA:  Follow-up SBO EXAM: ABDOMEN - 1 VIEW COMPARISON:  Abdominal radiograph dated June 12, 2022 FINDINGS: Enteric tube tip and side port project over the stomach. Contrast material is seen throughout the colon. Mildly distended gas-filled loops of small bowel are seen in the right hemiabdomen. No contrast is seen in the rectum. IMPRESSION: Contrast material seen in the colon. Electronically Signed   By: Yetta Glassman M.D.   On: 06/05/2022 08:27   DG Chest Port 1 View  Result Date: 06/05/2022 CLINICAL DATA:  Hypoxia EXAM: PORTABLE CHEST 1 VIEW COMPARISON:  06/04/2022 FINDINGS: Enteric tube reaches the stomach at least. Low volume chest with indistinct density in the bilateral lungs. Cardiomegaly and vascular pedicle widening. No visible effusion or  pneumothorax. IMPRESSION: 1. Cardiomegaly and vascular congestion. 2. Streaky density in the bilateral lungs which could be atelectasis or infection. Electronically Signed   By: Jorje Guild M.D.   On: 06/05/2022 05:46   DG Abd Portable 1V-Small Bowel Obstruction Protocol-initial, 8 hr delay  Result Date: 06/04/2022 CLINICAL DATA:  8 hour delayed film for small-bowel follow-through. EXAM: PORTABLE ABDOMEN - 1 VIEW COMPARISON:  CT yesterday. FINDINGS: Tip and side port of the enteric tube below the diaphragm in the stomach. Stomach is distended with enteric contrast. There is no contrast in the colon. Persistent gaseous distention of small bowel in the central abdomen. IMPRESSION: 1. No contrast in the colon. Persistent small bowel obstruction with gaseous small bowel distension. 2. Gastric distension with a large amount of residual contrast in the stomach. 3. Recommend 24 hour film. Electronically Signed   By: Keith Rake M.D.   On: 06/04/2022 18:56   DG Chest Portable 1 View  Result Date: 06/04/2022 CLINICAL DATA:  NG tube placement. EXAM: PORTABLE CHEST 1 VIEW COMPARISON:  Abdominal CT earlier today FINDINGS: Tip and side port of the enteric tube below the diaphragm in the stomach. There is streaky bibasilar atelectasis. Lung volumes are low. Heart size is normal for technique. No pneumothorax or large pleural effusion. IMPRESSION: 1. Tip and side port of the enteric tube below the diaphragm in the stomach. 2. Low lung volumes with streaky bibasilar atelectasis. Electronically Signed   By: Aurther Loft.D.  On: 06/04/2022 00:28   CT ABDOMEN PELVIS W CONTRAST  Result Date: 06/03/2022 CLINICAL DATA:  Vomiting and weakness. Bowel obstruction suspected. Patient reports a have not passed gas or stool for 7 days EXAM: CT ABDOMEN AND PELVIS WITH CONTRAST TECHNIQUE: Multidetector CT imaging of the abdomen and pelvis was performed using the standard protocol following bolus administration of  intravenous contrast. RADIATION DOSE REDUCTION: This exam was performed according to the departmental dose-optimization program which includes automated exposure control, adjustment of the mA and/or kV according to patient size and/or use of iterative reconstruction technique. CONTRAST:  160m OMNIPAQUE IOHEXOL 300 MG/ML  SOLN COMPARISON:  CT 01/14/2019 FINDINGS: Lower chest: Mild cardiomegaly. Atelectasis in both lower lobes. No pleural effusion. Hepatobiliary: Mild subjective hepatic steatosis. Gallbladder physiologically distended, no calcified stone. No biliary dilatation. Pancreas: Fatty atrophy.  No ductal dilatation or inflammation. Spleen: Normal in size without focal abnormality. Adrenals/Urinary Tract: No adrenal nodule. Two punctate nonobstructing stones in the mid left kidney. No hydronephrosis. No focal renal lesion. Urinary bladder is partially distended, no wall thickening. Stomach/Bowel: Marked fluid distention of the stomach. Dilated fluid-filled proximal small bowel, greatest small bowel dimension 5.3 cm. There is no evidence of bowel pneumatosis. Mild mesenteric edema. Transition point in the right lower quadrant series 2, image 73. The distal small bowel is decompressed. There is fecalization of distal ileal small bowel contents. Large volume of stool in the ascending, transverse, and descending colon. Small to moderate stool in the sigmoid colon. There is small sigmoid redundancy. No abnormal rectal distention. No colonic inflammation or evidence of colonic mass. Vascular/Lymphatic: Mild aortic atherosclerosis and tortuosity. Patent portal vein. No portal venous or mesenteric gas. No abdominopelvic adenopathy. Reproductive: Prostate is unremarkable. Other: No free air or ascites. Mild generalized body wall edema. No abdominal wall hernia. Musculoskeletal: Stable sclerotic focus within T9 vertebral body, likely bone island. Degenerative change in the spine and both hips. There is fatty atrophy of  the paraspinal musculature. IMPRESSION: 1. Small bowel obstruction with transition point in the right lower quadrant, likely due to adhesions. 2. Large volume of stool in the ascending, transverse, and descending colon, consistent with constipation. 3. Nonobstructing left nephrolithiasis. 4. Mild subjective hepatic steatosis. Aortic Atherosclerosis (ICD10-I70.0). Electronically Signed   By: MKeith RakeM.D.   On: 06/03/2022 22:46    Anti-infectives: Anti-infectives (From admission, onward)    None       Assessment/Plan: Patient with SBO that is resolving. Has some respiratory decline. Getting 11L now. Getting lasix. Clear diet adv as tolerated Dr. JArnoldo Moralecovering the weekend Will see how he does and hopefully will avoid surgery   LOS: 2 days    LVirl Cagey7/21/2023

## 2022-06-05 NOTE — Plan of Care (Signed)
  Problem: Coping: Goal: Ability to adjust to condition or change in health will improve Outcome: Progressing   Problem: Fluid Volume: Goal: Ability to maintain a balanced intake and output will improve Outcome: Progressing   Problem: Health Behavior/Discharge Planning: Goal: Ability to identify and utilize available resources and services will improve Outcome: Progressing Goal: Ability to manage health-related needs will improve Outcome: Progressing

## 2022-06-05 NOTE — Assessment & Plan Note (Addendum)
-  saturation 96-98% on 5L at time of d/c -On this admission patient was as high as 11 L of oxygen, -Improved tachycardia, tachypnea -Hypoxic off supplemental oxygen -IV fluids discontinued, IVLasix was given x2 06/05/2022 Chest x-ray personally reviewed--  cardiomegaly and vascular congestion.  -Cardiology concern as patient's pulmonary hypertension is contributing to acute on chronic respiratory failure>>outpt referral to advanced HF clinic/Dr. Shirlee Latch with whom pt had lost follow up -d/c home with lasix 40 mg po daily

## 2022-06-05 NOTE — Progress Notes (Signed)
06/04/22 2040  Vitals  Temp 98.5 F (36.9 C)  Temp Source Oral  BP 136/84  MAP (mmHg) 100  BP Location Left Arm  BP Method Automatic  Patient Position (if appropriate) Lying  Pulse Rate (!) 112  Pulse Rate Source Monitor  Resp 20  MEWS COLOR  MEWS Score Color Yellow  Oxygen Therapy  SpO2 96 %  O2 Device Nasal Cannula  MEWS Score  MEWS Temp 0  MEWS Systolic 0  MEWS Pulse 2  MEWS RR 0  MEWS LOC 0  MEWS Score 2

## 2022-06-05 NOTE — Progress Notes (Signed)
Pt transferred via [facility] for pt comfort to ICU room 2. Pt remains on 6 lpm [place] with SaO2 91-92%, remains tachypneic at 40/min, tachycardic at 130/min. Pt advised for reason for transfer and is agreeable. Alert and oriented, denies c/o pain.

## 2022-06-05 NOTE — Progress Notes (Signed)
[facility]  Patient has had multiple Bms and Kub with contrast throughout the colon.  Curlene Labrum, MD [facility] [address] [place], [place] [address] [phone] (office)

## 2022-06-05 NOTE — Progress Notes (Signed)
pO2, Ven 32 - 45 mmHg <31 Low Panic    Notified Dr. Roger Shelter in regards to critical value on patient.

## 2022-06-05 NOTE — Progress Notes (Signed)
   06/05/22 0848  Assess: MEWS Score  Temp 99 F (37.2 C)  BP 129/73  MAP (mmHg) 86  Pulse Rate (!) 120  Resp (!) 40  Level of Consciousness Alert  SpO2 92 %  O2 Device HFNC  O2 Flow Rate (L/min) 6 L/min  Assess: MEWS Score  MEWS Temp 0  MEWS Systolic 0  MEWS Pulse 2  MEWS RR 3  MEWS LOC 0  MEWS Score 5  MEWS Score Color Red  Treat  Pain Scale 0-10  Pain Score 0  Assess: SIRS CRITERIA  SIRS Temperature  0  SIRS Pulse 1  SIRS Respirations  1  SIRS WBC 1  SIRS Score Sum  3   Pt remains up in recliner, states breathing, "feels a lot better", but remains tachypneic at 40/min. Maintaining SaO2 on 6 lpm [place] pt states that this is his normal level of O2 supplementation at home. Pt has voided 300 ml of amber colored urine since IV lasix administered. Pt has congested cough, expectorates mod amount of thick brown colored phlegm. Continues with crackles in mid-lungs to bases. Skin warm and dry, color appropriate. Cap refill < 3 sec. MD Shahmehdi updated on current condition. Blood has been drawn for ordered BNP and lactic acid levels.

## 2022-06-05 NOTE — Consult Note (Signed)
Cardiology Consultation:   Patient ID: Colton Jackson. MRN: [id number]; DOB: November 18, 1950  Admit date: 06/03/2022 Date of Consult: 06/05/2022  PCP:  Sharilyn Sites, MD   [facility] Cardiologist:  Rozann Lesches, MD (last visit in 2019) AHF: Dr. Aundra Dubin (last visit in 2019)  Patient Profile:   Kacin Dancy. is a 71 y.o. male with a hx of pulmonary HTN (felt to be Group 3), HTN, HLD, Type II DM, chronic hypoxic respiratory failure (on 4 L nasal cannula at baseline) and OSA who is being seen 06/05/2022 for the evaluation of CHF and preoperative cardiac clearance at the request of Dr. Roger Shelter.  History of Present Illness:   Mr. Kneale was last examined by Dr. Aundra Dubin in 12/2018 for pulmonary hypertension and cardiac catheterization in 2019 had shown no obstructive CAD.  He did have mildly elevated filling pressures and normal cardiac output with moderate pulmonary hypertension and this was felt to be group 3 due to chronic bronchitis and OHS/OSA. Plans were to review further with Pulmonology and have him follow-up in 3 months but he has not been evaluated since. He was continued on Lasix 40 mg daily.   He presented to [facility] ED on 06/03/2022 for evaluation of abdominal pain, nausea and vomiting starting earlier in the morning. Also reported not having a bowel movement for over 7 days. Initial labs showed WBC 14.5, Hgb 13.1, platelets 275, +142, K+ 3.4 and creatinine 1.27.  Occult blood positive. Abdominal CT showed a small bowel obstruction with transition point in the right lower quadrant likely due to adhesions. CXR showed low lung volumes but no acute changes. EKG showed sinus tachycardia, heart rate 112 with baseline artifact but no distinct ST changes.  He was admitted for further management of a small bowel obstruction with an NG tube being placed and started on maintenance fluids. Repeat imaging yesterday showed persistent small bowel obstruction with small bowel distention.  He is being followed by general surgery with plans for possible exploratory laparotomy later today.  This morning, he developed acutely worsening dyspnea and was tachypneic. Was placed on 10 L HFNC. CXR was obtained and showed cardiomegaly and vascular congestion with streaky densities in the bilateral lungs which may represent atelectasis or infection. Lactic acid normal at 1.5 with BNP pending. He had been receiving IV fluids at 53m/hr after receiving a 1L bolus on admission and fluids were discontinued this AM. Received IV Lasix 464m I&O's not fully recorded. Weight at 283 lbs yesterday but no weight on file for today. Repeat abdominal imaging shows contrast material has passed into the colon and his nurse reports he had 2 BM's overnight. He says his abdominal pain is starting to improve. No chest pain or palpitations. Has chronic dyspnea and 3-pillow orthopnea but acutely worsening dyspnea this AM. Was unable to use his CPAP last night due to the NG tube in place.     Past Medical History:  Diagnosis Date   Allergic rhinitis    Aneurysm artery, neck (HCC)    Dr. YoAnnamaria Bootsulmonologist   Degenerative disk disease    Essential hypertension    GERD (gastroesophageal reflux disease)    H/O cardiovascular stress test 02/02/11   EF 59% - Normal stress test   Hearing deficit, right    Knee pain, bilateral    Lower back pain    Morbid obesity (HCC)    OSA (obstructive sleep apnea)    On CPAP   Thyroid nodule    Type 2 diabetes  mellitus ([place])    Type 2 diabetes mellitus without complication ([place]) 8/56/3149   Qualifier: Diagnosis of  By: Annamaria Boots MD, Clinton D     Past Surgical History:  Procedure Laterality Date   APPENDECTOMY     CARDIAC CATHETERIZATION     Cataract surgery Bilateral    COLONOSCOPY WITH PROPOFOL N/A 08/01/2019   Procedure: COLONOSCOPY WITH PROPOFOL;  Surgeon: Aviva Signs, MD;  Location: [facility];  Service: General;  Laterality: N/A;   KNEE ARTHROSCOPY Bilateral     NASAL SEPTOPLASTY W/ TURBINOPLASTY     RIGHT/LEFT HEART CATH AND CORONARY ANGIOGRAPHY N/A 06/23/2018   Procedure: RIGHT/LEFT HEART CATH AND CORONARY ANGIOGRAPHY;  Surgeon: Larey Dresser, MD;  Location: [facility];  Service: Cardiovascular;  Laterality: N/A;     Home Medications:  Prior to Admission medications   Medication Sig Start Date End Date Taking? Authorizing Provider  acetaminophen (TYLENOL) 500 MG tablet Take 500 mg by mouth 3 (three) times daily.   Yes [provider]  amLODipine (NORVASC) 10 MG tablet Take 1 tablet by mouth once daily Patient taking differently: Take 10 mg by mouth daily. 01/27/19  Yes Larey Dresser, MD  Cholecalciferol (VITAMIN D3) 10 MCG (400 UNIT) CAPS Take 1 capsule by mouth daily.   Yes [provider]  cloNIDine (CATAPRES) 0.2 MG tablet Take 1 tablet (0.2 mg total) by mouth 3 (three) times daily. 01/18/19  Yes Emokpae, Courage, MD  cromolyn (NASALCROM) 5.2 MG/ACT nasal spray Place 1 spray into both nostrils 3 (three) times daily as needed for allergies.    Yes [provider]  famotidine (PEPCID) 20 MG tablet Take 20 mg by mouth 2 (two) times daily.    Yes [provider]  furosemide (LASIX) 40 MG tablet Take 40 mg by mouth daily. 04/03/22  Yes [provider]  glipiZIDE (GLUCOTROL) 10 MG tablet Take 10 mg by mouth 2 (two) times daily.  06/26/19  Yes [provider]  HYDROcodone-acetaminophen (NORCO) 10-325 MG tablet Take 1 tablet by mouth 3 (three) times daily as needed for moderate pain.    Yes [provider]  Javier Docker Oil 500 MG CAPS Take 500 mg by mouth daily.    Yes [provider]  latanoprost (XALATAN) 0.005 % ophthalmic solution INSTILL 1 DROP IN BOTH EYES EVERY DAY AT BEDTIME- NEEDS TO FOLLOWUP WITH GLAUCOMA DOCTOR FOR REFILLS Patient taking differently: Place 1 drop into both eyes at bedtime. 03/24/21  Yes Rankin, Clent Demark, MD  losartan (COZAAR) 100 MG tablet Take 1 tablet (100 mg  total) by mouth daily. 01/19/19  Yes Roxan Hockey, MD  meclizine (ANTIVERT) 25 MG tablet Take 25 mg by mouth 3 (three) times daily as needed for dizziness or nausea (when taking hydrocodone/apap).    Yes [provider]  meloxicam (MOBIC) 7.5 MG tablet Take 7.5 mg by mouth 2 (two) times daily. 05/22/19  Yes [provider]  metFORMIN (GLUCOPHAGE-XR) 500 MG 24 hr tablet Take 1,000 mg by mouth 2 (two) times daily. 12/19/18  Yes [provider]  metoprolol succinate (TOPROL-XL) 50 MG 24 hr tablet Take 1 tablet (50 mg total) by mouth daily. 01/19/19  Yes Emokpae, Courage, MD  mupirocin ointment (BACTROBAN) 2 % 1 application 2 (two) times daily.   Yes [provider]  OXYGEN Inhale 5 L/hr into the lungs daily.   Yes [provider]  psyllium (METAMUCIL) 58.6 % powder Take 1 packet by mouth 2 (two) times daily.  Yes [provider]  trospium (SANCTURA) 20 MG tablet Take 1 tablet (20 mg total) by mouth 2 (two) times daily. 03/11/22  Yes Irine Seal, MD  mirabegron ER (MYRBETRIQ) 50 MG TB24 tablet Take 1 tablet (50 mg total) by mouth daily. Patient not taking: Reported on 06/03/2022 04/16/22   Irine Seal, MD  terbinafine (LAMISIL) 250 MG tablet Take 500 mg by mouth daily. Patient not taking: Reported on 06/03/2022 01/18/22   [provider]    Inpatient Medications: Scheduled Meds:  amLODipine  10 mg Per Tube Daily   bisacodyl  10 mg Rectal Once   bisacodyl  10 mg Rectal BID   cloNIDine  0.2 mg Per Tube TID   cromolyn  1 spray Each Nare TID   heparin  5,000 Units Subcutaneous Q8H   insulin aspart  0-15 Units Subcutaneous TID WC   insulin aspart  0-5 Units Subcutaneous QHS   insulin detemir  5 Units Subcutaneous BID   [START ON 06/06/2022] losartan  100 mg Per Tube Daily   pantoprazole (PROTONIX) IV  40 mg Intravenous Daily   Continuous Infusions:  PRN Meds: acetaminophen **OR** acetaminophen, hydrALAZINE, levalbuterol, morphine injection,  ondansetron **OR** ondansetron (ZOFRAN) IV, oxyCODONE, phenol  Allergies:    Allergies  Allergen Reactions   Covid-19 Mrna Vaccine [employer]) [Covid-19 Mrna Vacc (Moderna)] Other (See Comments)    Joint pain and fatigue   Sulfa Antibiotics Swelling    eyelids    Social History:   Social History   Socioeconomic History   Marital status: Widowed    Spouse name: Not on file   Number of children: Not on file   Years of education: Not on file   Highest education level: Not on file  Occupational History   Occupation: former [name], tow truck service  Tobacco Use   Smoking status: Former    Types: Cigarettes    Quit date: 1971    Years since quitting: 52.5   Smokeless tobacco: Never   Tobacco comments:    smoked when he was a teenager  Scientific laboratory technician Use: Never used  Substance and Sexual Activity   Alcohol use: No   Drug use: No   Sexual activity: Not on file  Other Topics Concern   Not on file  Social History Narrative   Not on file   Social Determinants of Health   Financial Resource Strain: Not on file  Food Insecurity: Not on file  Transportation Needs: Not on file  Physical Activity: Not on file  Stress: Not on file  Social Connections: Not on file  Intimate Partner Violence: Not on file    Family History:    Family History  Problem Relation Age of Onset   Lung disease Father    Stroke Father 75   Diabetes Father    Diabetes Maternal Grandfather    Heart disease Paternal Grandmother    Stroke Paternal Grandfather 83   Diabetes Paternal Grandfather    Diabetes Mother    Cancer Mother    Stroke Mother 83   Diabetes Sister    Diabetes Brother 28     ROS:  Please see the history of present illness.   All other ROS reviewed and negative.     Physical Exam/Data:   Vitals:   06/05/22 0557 06/05/22 0746 06/05/22 0800 06/05/22 0848  BP: 140/71 (!) 142/75  129/73  Pulse: (!) 116 (!) 121 (!) 120 (!) 120  Resp: (!) 26 (!) 35 (!) 36 (!)  40  Temp:  98.8 F (37.1 C) 99.4 F (37.4 C)  99 F (37.2 C)  TempSrc: Oral   Oral  SpO2: 99% 96% 98% 92%  Weight:      Height:        Intake/Output Summary (Last 24 hours) at 06/05/2022 1000 Last data filed at 06/05/2022 0016 Gross per 24 hour  Intake --  Output 300 ml  Net -300 ml      06/04/2022    6:14 AM 01/22/2022   11:33 AM 07/25/2021   11:45 AM  Last 3 Weights  Weight (lbs) 283 lb 11.7 oz 290 lb 300 lb  Weight (kg) 128.7 kg 131.543 kg 136.079 kg     Body mass index is 44.44 kg/m.  General: Pleasant obese male, sitting in recliner.  HEENT: normal Neck: JVD elevated.  Vascular: No carotid bruits; Distal pulses 2+ bilaterally Cardiac:  normal S1, S2; Regular rhythm, tachycardiac rate.  Lungs: rales along bases bilaterally and using accessory muscles.  Abd: soft, nontender, no hepatomegaly  Ext: 1+ pitting edema bilaterally.  Musculoskeletal:  No deformities, BUE and BLE strength normal and equal Skin: warm and dry  Neuro:  CNs 2-12 intact, no focal abnormalities noted Psych:  Normal affect   EKG:  The EKG was personally reviewed and demonstrates: Sinus tachycardia, heart rate 112 with baseline artifact but no distinct ST changes  Telemetry: Not currently on telemetry.   Relevant CV Studies:  Echocardiogram: 01/2018 Study Conclusions   - Left ventricle: The cavity size was normal. Wall thickness was    increased in a pattern of moderate LVH. Systolic function was    vigorous. The estimated ejection fraction was in the range of 65%    to 70%. Wall motion was normal; there were no regional wall    motion abnormalities. Left ventricular diastolic function    parameters were normal for the patient&'s age.  - Aortic valve: Mildly to moderately calcified annulus. Trileaflet.    Mean gradient (S): 5 mm Hg. Valve area (VTI): 3.67 cm^2.  - Mitral valve: Mildly calcified annulus. There was trivial    regurgitation.  - Right atrium: Central venous pressure (est): 8 mm Hg.  -  Atrial septum: The septum bowed from right to left, consistent    with increased right atrial pressure.  - Tricuspid valve: There was mild regurgitation.  - Pulmonary arteries: Systolic pressure was severely increased. PA    peak pressure: 72 mm Hg (S).  - Pericardium, extracardiac: There was no pericardial effusion.   R/LHC: 06/2018 1. Mildly elevated filling pressures.  2. Normal cardiac output.  3. Moderate pulmonary artery hypertension.  4. No obstructive coronary disease   Pulmonary hypertension, probably group 3 from chronic bronchitis and OHS/OSA.  Will get V/Q scan to rule out chronic PE.  Need to discuss with pulmonary whether patient could have sarcoidosis given calcified nodes noted in mediastinum on CT.   Laboratory Data:  High Sensitivity Troponin:  No results for input(s): "TROPONINIHS" in the last 720 hours.   Chemistry Recent Labs  Lab 06/03/22 1751 06/04/22 0432 06/05/22 0647  NA 142 141 145  K 3.4* 3.6 3.5  CL 94* 95* 99  CO2 33* 32 31  GLUCOSE 183* 191* 184*  BUN _0 CREATININE 1.27* 1.23 1.33*  CALCIUM 9.0 8.4* 7.8*  MG  --  1.4*  --   GFRNONAA >60 >60 58*  ANIONGAP _1 Recent Labs  Lab 06/03/22 1751 06/04/22 0432  06/05/22 0647  PROT 8.6* 7.6 7.5  ALBUMIN 4.5 3.8 3.6  AST _0 ALT _1 ALKPHOS 59 50 53  BILITOT 0.8 0.6 1.3*   Lipids No results for input(s): "CHOL", "TRIG", "HDL", "LABVLDL", "LDLCALC", "CHOLHDL" in the last 168 hours.  Hematology Recent Labs  Lab 06/03/22 1751 06/04/22 0432 06/05/22 0647  WBC 14.5* 9.3 8.7  RBC 4.12* 3.79* 3.96*  HGB 13.1 12.2* 12.6*  HCT 39.6 36.4* 39.5  MCV 96.1 96.0 99.7  MCH 31.8 32.2 31.8  MCHC 33.1 33.5 31.9  RDW 12.6 12.8 13.0  PLT 275 254 262   Thyroid  Recent Labs  Lab 06/04/22 0432  TSH 0.855    BNPNo results for input(s): "BNP", "PROBNP" in the last 168 hours.  DDimer No results for input(s): "DDIMER" in the last 168 hours.   Radiology/Studies:  DG Abd 1  View  Result Date: 06/05/2022 CLINICAL DATA:  Follow-up SBO EXAM: ABDOMEN - 1 VIEW COMPARISON:  Abdominal radiograph dated June 12, 2022 FINDINGS: Enteric tube tip and side port project over the stomach. Contrast material is seen throughout the colon. Mildly distended gas-filled loops of small bowel are seen in the right hemiabdomen. No contrast is seen in the rectum. IMPRESSION: Contrast material seen in the colon. Electronically Signed   By: Yetta Glassman M.D.   On: 06/05/2022 08:27   DG Chest Port 1 View  Result Date: 06/05/2022 CLINICAL DATA:  Hypoxia EXAM: PORTABLE CHEST 1 VIEW COMPARISON:  06/04/2022 FINDINGS: Enteric tube reaches the stomach at least. Low volume chest with indistinct density in the bilateral lungs. Cardiomegaly and vascular pedicle widening. No visible effusion or pneumothorax. IMPRESSION: 1. Cardiomegaly and vascular congestion. 2. Streaky density in the bilateral lungs which could be atelectasis or infection. Electronically Signed   By: Jorje Guild M.D.   On: 06/05/2022 05:46   DG Abd Portable 1V-Small Bowel Obstruction Protocol-initial, 8 hr delay  Result Date: 06/04/2022 CLINICAL DATA:  8 hour delayed film for small-bowel follow-through. EXAM: PORTABLE ABDOMEN - 1 VIEW COMPARISON:  CT yesterday. FINDINGS: Tip and side port of the enteric tube below the diaphragm in the stomach. Stomach is distended with enteric contrast. There is no contrast in the colon. Persistent gaseous distention of small bowel in the central abdomen. IMPRESSION: 1. No contrast in the colon. Persistent small bowel obstruction with gaseous small bowel distension. 2. Gastric distension with a large amount of residual contrast in the stomach. 3. Recommend 24 hour film. Electronically Signed   By: Keith Rake M.D.   On: 06/04/2022 18:56   DG Chest Portable 1 View  Result Date: 06/04/2022 CLINICAL DATA:  NG tube placement. EXAM: PORTABLE CHEST 1 VIEW COMPARISON:  Abdominal CT earlier today  FINDINGS: Tip and side port of the enteric tube below the diaphragm in the stomach. There is streaky bibasilar atelectasis. Lung volumes are low. Heart size is normal for technique. No pneumothorax or large pleural effusion. IMPRESSION: 1. Tip and side port of the enteric tube below the diaphragm in the stomach. 2. Low lung volumes with streaky bibasilar atelectasis. Electronically Signed   By: Keith Rake M.D.   On: 06/04/2022 00:28   CT ABDOMEN PELVIS W CONTRAST  Result Date: 06/03/2022 CLINICAL DATA:  Vomiting and weakness. Bowel obstruction suspected. Patient reports a have not passed gas or stool for 7 days EXAM: CT ABDOMEN AND PELVIS WITH CONTRAST TECHNIQUE: Multidetector CT imaging of the abdomen and pelvis was performed using the standard protocol following  bolus administration of intravenous contrast. RADIATION DOSE REDUCTION: This exam was performed according to the departmental dose-optimization program which includes automated exposure control, adjustment of the mA and/or kV according to patient size and/or use of iterative reconstruction technique. CONTRAST:  158m OMNIPAQUE IOHEXOL 300 MG/ML  SOLN COMPARISON:  CT 01/14/2019 FINDINGS: Lower chest: Mild cardiomegaly. Atelectasis in both lower lobes. No pleural effusion. Hepatobiliary: Mild subjective hepatic steatosis. Gallbladder physiologically distended, no calcified stone. No biliary dilatation. Pancreas: Fatty atrophy.  No ductal dilatation or inflammation. Spleen: Normal in size without focal abnormality. Adrenals/Urinary Tract: No adrenal nodule. Two punctate nonobstructing stones in the mid left kidney. No hydronephrosis. No focal renal lesion. Urinary bladder is partially distended, no wall thickening. Stomach/Bowel: Marked fluid distention of the stomach. Dilated fluid-filled proximal small bowel, greatest small bowel dimension 5.3 cm. There is no evidence of bowel pneumatosis. Mild mesenteric edema. Transition point in the right lower  quadrant series 2, image 73. The distal small bowel is decompressed. There is fecalization of distal ileal small bowel contents. Large volume of stool in the ascending, transverse, and descending colon. Small to moderate stool in the sigmoid colon. There is small sigmoid redundancy. No abnormal rectal distention. No colonic inflammation or evidence of colonic mass. Vascular/Lymphatic: Mild aortic atherosclerosis and tortuosity. Patent portal vein. No portal venous or mesenteric gas. No abdominopelvic adenopathy. Reproductive: Prostate is unremarkable. Other: No free air or ascites. Mild generalized body wall edema. No abdominal wall hernia. Musculoskeletal: Stable sclerotic focus within T9 vertebral body, likely bone island. Degenerative change in the spine and both hips. There is fatty atrophy of the paraspinal musculature. IMPRESSION: 1. Small bowel obstruction with transition point in the right lower quadrant, likely due to adhesions. 2. Large volume of stool in the ascending, transverse, and descending colon, consistent with constipation. 3. Nonobstructing left nephrolithiasis. 4. Mild subjective hepatic steatosis. Aortic Atherosclerosis (ICD10-I70.0). Electronically Signed   By: MKeith RakeM.D.   On: 06/03/2022 22:46     Assessment and Plan:   1. Acute Hypoxic Respiratory Failure in the setting of Volume Overload - Developed acutely worsening dyspnea this morning which is likely secondary to fluid overload as he has been receiving IV fluids given his NPO status. CXR this AM showed vascular congestion and BNP is pending. He has received IV Lasix 40 mg with an output of 600 mL thus far but is still tachypneic and using accessory muscles on examination. Will dose IV Lasix 40 mg x1 again. Follow I's and O's along with daily weights. Given that his NG tube is being removed today, hopefully he can start using his CPAP again tonight. Would update an echocardiogram once his respiratory status  improves.  2. Pulmonary HTN/Chronic Hypoxic Respiratory Failure - He has known Group 3 Pulmonary HTN and has been followed by Pulmonology.  He is on 4 L nasal cannula at baseline. Would update an echocardiogram once his respiratory status has improved.  3. Preoperative Cardiac Clearance for Exploratory Laparotomy - While his RCRI risk is at 6.6% risk of a major cardiac event, anticipate he would be at least moderate to high risk given his underlying respiratory issues. Prior catheterization in 2019 showed no obstructive CAD but he does have chronic hypoxic respiratory failure and is on 4 L nasal cannula at baseline. Unable to perform 4 METS of activity given his dyspnea but denies any recent chest pain. - Thankfully, he is starting to have bowel movements and repeat imaging shows he is passing contrast. Notified by the patient's  nurse his laparotomy for today has been canceled by Dr. Constance Haw given improvement.  4. HTN - BP has been stable at 123/67 - 143/78 within the past 24 hours. Remains on Losartan 150m daily, Amlodipine 145mdaily and Clonidine 0.26m57mID. Was on Toprol-XL prior to admission and can restart once his respiratory status improves.   For questions or updates, please contact [facility] consult [email or web address] for contact info under    Signed, BriErma HeritageA-C  06/05/2022 10:00 AM

## 2022-06-06 ENCOUNTER — Inpatient Hospital Stay (HOSPITAL_COMMUNITY): Payer: Medicare Other

## 2022-06-06 DIAGNOSIS — K56609 Unspecified intestinal obstruction, unspecified as to partial versus complete obstruction: Secondary | ICD-10-CM | POA: Diagnosis not present

## 2022-06-06 LAB — CBC
HCT: 38.6 % — ABNORMAL LOW (ref 39.0–52.0)
Hemoglobin: 12.7 g/dL — ABNORMAL LOW (ref 13.0–17.0)
MCH: 32 pg (ref 26.0–34.0)
MCHC: 32.9 g/dL (ref 30.0–36.0)
MCV: 97.2 fL (ref 80.0–100.0)
Platelets: 262 10*3/uL (ref 150–400)
RBC: 3.97 MIL/uL — ABNORMAL LOW (ref 4.22–5.81)
RDW: 13 % (ref 11.5–15.5)
WBC: 10.6 10*3/uL — ABNORMAL HIGH (ref 4.0–10.5)
nRBC: 0 % (ref 0.0–0.2)

## 2022-06-06 LAB — COMPREHENSIVE METABOLIC PANEL
ALT: 16 U/L (ref 0–44)
AST: 19 U/L (ref 15–41)
Albumin: 3.5 g/dL (ref 3.5–5.0)
Alkaline Phosphatase: 59 U/L (ref 38–126)
Anion gap: 12 (ref 5–15)
BUN: 23 mg/dL (ref 8–23)
CO2: 32 mmol/L (ref 22–32)
Calcium: 7.5 mg/dL — ABNORMAL LOW (ref 8.9–10.3)
Chloride: 100 mmol/L (ref 98–111)
Creatinine, Ser: 1.53 mg/dL — ABNORMAL HIGH (ref 0.61–1.24)
GFR, Estimated: 49 mL/min — ABNORMAL LOW (ref >60)
Glucose, Bld: 239 mg/dL — ABNORMAL HIGH (ref 70–99)
Potassium: 3 mmol/L — ABNORMAL LOW (ref 3.5–5.1)
Sodium: 144 mmol/L (ref 135–145)
Total Bilirubin: 1.3 mg/dL — ABNORMAL HIGH (ref 0.3–1.2)
Total Protein: 7.7 g/dL (ref 6.5–8.1)

## 2022-06-06 LAB — GLUCOSE, CAPILLARY
Glucose-Capillary: 208 mg/dL — ABNORMAL HIGH (ref 70–99)
Glucose-Capillary: 183 mg/dL — ABNORMAL HIGH (ref 70–99)
Glucose-Capillary: 185 mg/dL — ABNORMAL HIGH (ref 70–99)
Glucose-Capillary: 157 mg/dL — ABNORMAL HIGH (ref 70–99)

## 2022-06-06 LAB — TROPONIN I (HIGH SENSITIVITY): Troponin I (High Sensitivity): 61 ng/L — ABNORMAL HIGH (ref <18)

## 2022-06-06 LAB — BRAIN NATRIURETIC PEPTIDE: B Natriuretic Peptide: 177 pg/mL — ABNORMAL HIGH (ref 0.0–100.0)

## 2022-06-06 MED ORDER — LOSARTAN POTASSIUM 50 MG PO TABS
100.0000 mg | ORAL_TABLET | Freq: Every day | ORAL | Status: DC
Start: 2022-06-07 — End: 2022-06-07
  Filled 2022-06-06: qty 2

## 2022-06-06 MED ORDER — METOPROLOL SUCCINATE ER 50 MG PO TB24
50.0000 mg | ORAL_TABLET | Freq: Every day | ORAL | Status: DC
  Administered 2022-06-07 – 2022-06-10 (×4): 50 mg via ORAL
  Filled 2022-06-06 (×4): qty 1

## 2022-06-06 MED ORDER — CALCIUM GLUCONATE-NACL 1-0.675 GM/50ML-% IV SOLN
1.0000 g | Freq: Once | INTRAVENOUS | Status: AC
Start: 2022-06-06 — End: 2022-06-06
  Administered 2022-06-06: 1000 mg via INTRAVENOUS
  Filled 2022-06-06: qty 50

## 2022-06-06 MED ORDER — MAGNESIUM SULFATE 2 GM/50ML IV SOLN
2.0000 g | Freq: Once | INTRAVENOUS | Status: AC
  Administered 2022-06-06: 2 g via INTRAVENOUS
  Filled 2022-06-06: qty 50

## 2022-06-06 MED ORDER — ALUM & MAG HYDROXIDE-SIMETH 200-200-20 MG/5ML PO SUSP
30.0000 mL | Freq: Once | ORAL | Status: AC
  Administered 2022-06-06: 30 mL via ORAL
  Filled 2022-06-06: qty 30

## 2022-06-06 MED ORDER — METOPROLOL TARTRATE 5 MG/5ML IV SOLN
5.0000 mg | Freq: Four times a day (QID) | INTRAVENOUS | Status: DC | PRN
Start: 2022-06-06 — End: 2022-06-10
  Administered 2022-06-06: 5 mg via INTRAVENOUS
  Filled 2022-06-06: qty 5

## 2022-06-06 MED ORDER — POTASSIUM CHLORIDE 10 MEQ/100ML IV SOLN
10.0000 meq | INTRAVENOUS | Status: AC
  Administered 2022-06-06 (×4): 10 meq via INTRAVENOUS
  Filled 2022-06-06 (×4): qty 100

## 2022-06-06 MED ORDER — INSULIN DETEMIR 100 UNIT/ML ~~LOC~~ SOLN
10.0000 [IU] | Freq: Two times a day (BID) | SUBCUTANEOUS | Status: DC
Start: 2022-06-06 — End: 2022-06-10
  Administered 2022-06-06 – 2022-06-10 (×9): 10 [IU] via SUBCUTANEOUS
  Filled 2022-06-06 (×11): qty 0.1

## 2022-06-06 MED ORDER — SODIUM CHLORIDE 0.9 % IV BOLUS
500.0000 mL | Freq: Once | INTRAVENOUS | Status: AC
  Administered 2022-06-06: 500 mL via INTRAVENOUS

## 2022-06-06 NOTE — Progress Notes (Signed)
Plugged in patient's machine in red outlet.  Patient's CPAP machine was placed near patient for him to use when he was ready.

## 2022-06-06 NOTE — Progress Notes (Signed)
PROGRESS NOTE    Patient: Colton Jackson.                            PCP: Sharilyn Sites, MD                    DOB: 19-May-1951            DOA: 06/03/2022 DGL:[id number]             DOS: 06/06/2022, 11:40 AM   LOS: 3 days   Date of Service: The patient was seen and examined on 06/06/2022  Subjective:   The patient was seen and examined this morning, more stable, down from 11 L to back to 5 L of oxygen, satting 87%, tachycardic with heart rate 104, tachypneic respiratory rate of 29, blood pressure at 114/58 Per nursing staff had a small bowel movement this morning  Awake alert, following commands   Brief Narrative:   Samarth Ogle. is a 71 y.o. male with medical history significant of neck artery aneurysm, hypertension, GERD, OSA, thyroid nodule, diabetes mellitus type 2, and more presents the ED with a chief complaint of abdominal distention and nausea vomiting.   Patient reports that his abdomen started getting distended the day prior to presentation.  On the day of presentation, he reports vomiting that first look like undigested food and then lifted and felt like feces.  Patient reports no blood in his emesis, but the NG tube does have blood in the output.  Patient reports that his last bowel movement was 1 week ago.  Approximately 6 weeks ago he had 5-6 days of diarrhea that spontaneously resolved.  Patient reports he has had abdominal pain for approximately 1 week that feels like fullness.  Reports has been progressively worse since it started.  He has never had symptoms like this before.  He does report chest pain that is associated with the abdomen.  He reports that it feels like indigestion coming up from his abdomen.  He has had dyspnea that is chronic for him.  He is dyspnea is worse with exertion.  He wears 4 L nasal cannula at home.  Patient denies any fevers, chills, or other symptoms.   Patient does not smoke, does not drink, does not use illicit drugs.  He is vaccinated for  COVID.  Patient is full code.  ED:  Temp 98.1, heart rate 100-111, respiratory rate 10-40, blood pressure 107/55-174/109, satting 85-100% Leukocytosis 14.5, hemoglobin 13.1 Hypokalemia at 3.4 CT abdomen pelvis shows small bowel obstruction with right lower quadrant transition zone likely due to adhesions.  Patient does have a history of appendectomy as a child General surgery was consulted and recommends suppositories, NG tube, n.p.o. Admission was requested for small bowel obstruction management    Assessment & Plan:   Principal Problem:   SBO (small bowel obstruction) (HCC) Active Problems:   Acute on chronic respiratory failure with hypoxia (HCC)   Essential hypertension   Elevated serum creatinine   Diabetes mellitus type 2 in obese (HCC)   Leukocytosis     Assessment and Plan: * SBO (small bowel obstruction) ([place]) - General surgery following closely -NG tube still in place -Still having inconsistent small BMs -KUB this a.m. revealing contrast in colon Repeating KUB in a.m. 06/07/2022  -N.p.o.  -Discontinuing IV fluids..  As patient's shortness of breath is getting worse, volume overload  -As needed analgesics -Hemoccult also positive, monitoring closely  Acute  on chronic respiratory failure with hypoxia (HCC) - Baseline O2 demand 6 L -Yesterday patient was as high as 11 L of oxygen, currently down to 5 L of oxygen but satting 87% -Still tachypneic, tachycardic -Hypoxic   -IV fluids discontinued, dose of Lasix was given x2 06/05/2022 Chest x-ray obtained: Revealing;  cardiomegaly and vascular congestion. 2. Streaky density in the bilateral lungs which could be atelectasis or infection :  Leukocytosis - White blood cell count 14.5 >> 9.3 >>8.7, 10.6 -Technically meets SIRS criteria, but likely from pain and not from infection, afebrile, -Leukocytosis is thought to be secondary to SBO -No antibiotics at this time -Monitoring closely  Diabetes mellitus type  2 in obese (HCC) - Hold metformin and glipizide CBG (last 3)  Recent Labs    06/05/22 1651 06/05/22 2056 06/06/22 0742  GLUCAP 220* 220* 208*     -Checking CBG every 4-6 hours forward SSI coverage -N.p.o. -Continue to monitor -Adding long-acting insulin-increasing dose accordingly  Elevated serum creatinine - Likely at baseline, he was trending up -Discontinue IV fluids -Trending creatinine 1.27, 1.23, 1.33, 1.53 today -Avoiding nephrotoxins    Essential hypertension - Continue amlodipine, clonidine, metoprolol When tolerating p.o. -Holding losartan -Continue to monitor -Stable, as needed hydralazine    ----------------------------------------------------------------------------------------------------------------------------------------------- Obesity - nutritional status:  The patient's BMI is: Body mass index is 44.44 kg/m. I agree with the assessment and plan as outlined  Currently n.p.o. --------------------------------------------------------------------------------------------------------------------------------------------  DVT prophylaxis:  heparin injection 5,000 Units Start: 06/04/22 0600 SCDs Start: 06/04/22 0406   Code Status:   Code Status: Full Code  Family Communication: No family member present at bedside- attempt will be made to update daily The above findings and plan of care has been discussed with patient (and family)  in detail,  they expressed understanding and agreement of above. -Advance care planning has been discussed.   Admission status:   Status is: Inpatient Remains inpatient appropriate because: Needing surgical management including NG tube, IV fluids, surgery evaluation for possible intervention     Procedures:   No admission procedures for hospital encounter.   Antimicrobials:  Anti-infectives (From admission, onward)    None        Medication:   amLODipine  10 mg Per Tube Daily   bisacodyl  10 mg Rectal  Once   bisacodyl  10 mg Rectal BID   Chlorhexidine Gluconate Cloth  6 each Topical Daily   cloNIDine  0.2 mg Oral TID   cromolyn  1 spray Each Nare TID   heparin  5,000 Units Subcutaneous Q8H   insulin aspart  0-15 Units Subcutaneous TID WC   insulin aspart  0-5 Units Subcutaneous QHS   insulin detemir  10 Units Subcutaneous BID   [START ON 06/07/2022] losartan  100 mg Oral Daily   metoprolol succinate  50 mg Oral Daily   pantoprazole (PROTONIX) IV  40 mg Intravenous Daily   sennosides  5 mL Oral BID    acetaminophen **OR** acetaminophen, hydrALAZINE, levalbuterol, metoprolol tartrate, morphine injection, ondansetron **OR** ondansetron (ZOFRAN) IV, oxyCODONE, phenol   Objective:   Vitals:   06/05/22 2005 06/06/22 0400 06/06/22 0746 06/06/22 0800  BP:  (!) 114/55  (!) 114/58  Pulse:  (!) 119  (!) 104  Resp:  (!) 29  (!) 29  Temp: 98.6 F (37 C) 98.8 F (37.1 C) 98.3 F (36.8 C)   TempSrc: Oral Oral Oral   SpO2:  97%  (!) 87%  Weight:      Height:  Intake/Output Summary (Last 24 hours) at 06/06/2022 1140 Last data filed at 06/06/2022 0800 Gross per 24 hour  Intake 1405.31 ml  Output 3950 ml  Net -2544.69 ml   Filed Weights   06/04/22 0614  Weight: 128.7 kg     Examination:      General:  AAO x 3,  cooperative, no distress;   HEENT:  NG tube in place, to wall suction, copious secretion Normocephalic, PERRL, otherwise with in Normal limits   Neuro:  CNII-XII intact. , normal motor and sensation, reflexes intact   Lungs:   Clear to auscultation BL, Respirations unlabored,  No wheezes / crackles  Cardio:    S1/S2, RRR, No murmure, No Rubs or Gallops   Abdomen:  Soft, non-tender, distended, hypoactive bowel sounds no guarding or peritoneal signs.  Muscular  skeletal:  Limited exam -global generalized weaknesses - in bed, able to move all 4 extremities,   2+ pulses,  symmetric, No pitting edema  Skin:  Dry, warm to touch, negative for any Rashes,  Wounds:  Please see nursing documentation            ------------------------------------------------------------------------------------------------------------------------------------------    LABs:     Latest Ref Rng & Units 06/06/2022    2:05 AM 06/05/2022    6:47 AM 06/04/2022    4:32 AM  CBC  WBC 4.0 - 10.5 K/uL 10.6  8.7  9.3   Hemoglobin 13.0 - 17.0 g/dL 12.7  12.6  12.2   Hematocrit 39.0 - 52.0 % 38.6  39.5  36.4   Platelets 150 - 400 K/uL 262  262  254       Latest Ref Rng & Units 06/06/2022    2:05 AM 06/05/2022    6:47 AM 06/04/2022    4:32 AM  CMP  Glucose 70 - 99 mg/dL 239  184  191   BUN 8 - 23 mg/dL _0 Creatinine 0.61 - 1.24 mg/dL 1.53  1.33  1.23   Sodium 135 - 145 mmol/L 144  145  141   Potassium 3.5 - 5.1 mmol/L 3.0  3.5  3.6   Chloride 98 - 111 mmol/L 100  99  95   CO2 22 - 32 mmol/L 32  31  32   Calcium 8.9 - 10.3 mg/dL 7.5  7.8  8.4   Total Protein 6.5 - 8.1 g/dL 7.7  7.5  7.6   Total Bilirubin 0.3 - 1.2 mg/dL 1.3  1.3  0.6   Alkaline Phos 38 - 126 U/L 59  53  50   AST 15 - 41 U/L _1 ALT 0 - 44 U/L _2 Micro Results Recent Results (from the past 240 hour(s))  MRSA Next Gen by PCR, Nasal     Status: None   Collection Time: 06/05/22  3:15 PM   Specimen: Nasal Mucosa; Nasal Swab  Result Value Ref Range Status   MRSA by PCR Next Gen NOT DETECTED NOT DETECTED Final    Comment: (NOTE) The GeneXpert MRSA Assay (FDA approved for NASAL specimens only), is one component of a comprehensive MRSA colonization surveillance program. It is not intended to diagnose MRSA infection nor to guide or monitor treatment for MRSA infections. Test performance is not FDA approved in patients less than 31 years old. Performed at [facility], [address], [place], [place] [address]     Radiology Reports DG  Abd 1 View  Result Date: 06/06/2022 CLINICAL DATA:  Nasogastric tube. EXAM: ABDOMEN - 1 VIEW COMPARISON:  06/06/2022. FINDINGS: A  mildly distended loop of small bowel is noted in the upper abdomen measuring 3.2 cm. An NG tube terminates in the stomach in the stomach is decompressed from the prior exam. No radio-opaque calculi. Mild atelectasis is present at the left lung base. IMPRESSION: 1. Mildly distended loop of small bowel in the left upper quadrant, not significantly changed from the prior exam. 2. An enteric tube terminates in the stomach in the stomach is decompressed from the prior exam. Electronically Signed   By: Brett Fairy M.D.   On: 06/06/2022 04:05   DG Abd 1 View  Result Date: 06/06/2022 CLINICAL DATA:  Small-bowel obstruction. EXAM: ABDOMEN - 1 VIEW COMPARISON:  06/05/2022. FINDINGS: Examination is limited due to patient's body habitus resulting in artifact. Mildly distended gas-filled loops of small bowel are noted in the abdomen measuring up to 3.8 cm. There is gaseous distention of the stomach. Mild residual contrast is noted in the in the colon. No radio-opaque calculi or other acute radiographic abnormality are seen. IMPRESSION: Mildly distended gas-filled loops of small bowel in the abdomen measuring up to 3.8 cm, slightly increased from the prior exam. Electronically Signed   By: Brett Fairy M.D.   On: 06/06/2022 02:21    SIGNED: Deatra Verlon, MD, FHM. [facility],  Pager (please use [email or web address] to page/text) Please use Epic Secure Chat for non-urgent communication (7AM-7PM)  If 7PM-7AM, please contact night-coverage [email or web address], 06/06/2022, 11:40 AM

## 2022-06-06 NOTE — Progress Notes (Signed)
Patient up in recliner most of the night for comfort. He verbalizes relief after the reinsertion on the ng tube with 2 cannisters full immediately. He was finally able to get a few hours of sleep afterward. Patient had decreased urinary output. Attending notified and 500cc NS bolus given.  Patient was not able to be positioned supine to obtain a good bladder scan, but he does denies pressure or the urge to void. We will continue to monitor.

## 2022-06-06 NOTE — Progress Notes (Signed)
1 Day Post-Op  Subjective: Patient denies any abdominal pain.  He did have a smear of a bowel movement this morning.  His NG tube had to be replaced yesterday evening and he had approximately 3 L out.  He is not passing flatus at the present time.  Objective: Vital signs in last 24 hours: Temp:  [98.3 F (36.8 C)-99.4 F (37.4 C)] 98.3 F (36.8 C) (07/22 0746) Pulse Rate:  [104-130] 104 (07/22 0800) Resp:  [29-40] 29 (07/22 0800) BP: (114-145)/(55-83) 114/58 (07/22 0800) SpO2:  [87 %-97 %] 87 % (07/22 0800) Last BM Date : 06/05/22  Intake/Output from previous day: 07/21 0701 - 07/22 0700 In: 1344.5 [P.O.:800; IV Piggyback:544.5] Out: 4650 [Urine:1500; Emesis/NG output:3150] Intake/Output this shift: No intake/output data recorded.  General appearance: alert, cooperative, and no distress GI: Soft, not particularly distended.  No rigidity noted.  Nontender.  Minimal bowel sounds appreciated.  Lab Results:  Recent Labs    06/05/22 0647 06/06/22 0205  WBC 8.7 10.6*  HGB 12.6* 12.7*  HCT 39.5 38.6*  PLT 262 262   BMET Recent Labs    06/05/22 0647 06/06/22 0205  NA 145 144  K 3.5 3.0*  CL 99 100  CO2 31 32  GLUCOSE 184* 239*  BUN 21 23  CREATININE 1.33* 1.53*  CALCIUM 7.8* 7.5*   PT/INR No results for input(s): "LABPROT", "INR" in the last 72 hours.  Studies/Results: DG Abd 1 View  Result Date: 06/06/2022 CLINICAL DATA:  Nasogastric tube. EXAM: ABDOMEN - 1 VIEW COMPARISON:  06/06/2022. FINDINGS: A mildly distended loop of small bowel is noted in the upper abdomen measuring 3.2 cm. An NG tube terminates in the stomach in the stomach is decompressed from the prior exam. No radio-opaque calculi. Mild atelectasis is present at the left lung base. IMPRESSION: 1. Mildly distended loop of small bowel in the left upper quadrant, not significantly changed from the prior exam. 2. An enteric tube terminates in the stomach in the stomach is decompressed from the prior exam.  Electronically Signed   By: Brett Fairy M.D.   On: 06/06/2022 04:05   DG Abd 1 View  Result Date: 06/06/2022 CLINICAL DATA:  Small-bowel obstruction. EXAM: ABDOMEN - 1 VIEW COMPARISON:  06/05/2022. FINDINGS: Examination is limited due to patient's body habitus resulting in artifact. Mildly distended gas-filled loops of small bowel are noted in the abdomen measuring up to 3.8 cm. There is gaseous distention of the stomach. Mild residual contrast is noted in the in the colon. No radio-opaque calculi or other acute radiographic abnormality are seen. IMPRESSION: Mildly distended gas-filled loops of small bowel in the abdomen measuring up to 3.8 cm, slightly increased from the prior exam. Electronically Signed   By: Brett Fairy M.D.   On: 06/06/2022 02:21   DG Abd 1 View  Result Date: 06/05/2022 CLINICAL DATA:  Follow-up SBO EXAM: ABDOMEN - 1 VIEW COMPARISON:  Abdominal radiograph dated June 12, 2022 FINDINGS: Enteric tube tip and side port project over the stomach. Contrast material is seen throughout the colon. Mildly distended gas-filled loops of small bowel are seen in the right hemiabdomen. No contrast is seen in the rectum. IMPRESSION: Contrast material seen in the colon. Electronically Signed   By: Yetta Glassman M.D.   On: 06/05/2022 08:27   DG Chest Port 1 View  Result Date: 06/05/2022 CLINICAL DATA:  Hypoxia EXAM: PORTABLE CHEST 1 VIEW COMPARISON:  06/04/2022 FINDINGS: Enteric tube reaches the stomach at least. Low volume chest with indistinct  density in the bilateral lungs. Cardiomegaly and vascular pedicle widening. No visible effusion or pneumothorax. IMPRESSION: 1. Cardiomegaly and vascular congestion. 2. Streaky density in the bilateral lungs which could be atelectasis or infection. Electronically Signed   By: Jorje Guild M.D.   On: 06/05/2022 05:46   DG Abd Portable 1V-Small Bowel Obstruction Protocol-initial, 8 hr delay  Result Date: 06/04/2022 CLINICAL DATA:  8 hour delayed  film for small-bowel follow-through. EXAM: PORTABLE ABDOMEN - 1 VIEW COMPARISON:  CT yesterday. FINDINGS: Tip and side port of the enteric tube below the diaphragm in the stomach. Stomach is distended with enteric contrast. There is no contrast in the colon. Persistent gaseous distention of small bowel in the central abdomen. IMPRESSION: 1. No contrast in the colon. Persistent small bowel obstruction with gaseous small bowel distension. 2. Gastric distension with a large amount of residual contrast in the stomach. 3. Recommend 24 hour film. Electronically Signed   By: Keith Rake M.D.   On: 06/04/2022 18:56    Anti-infectives: Anti-infectives (From admission, onward)    None       Assessment/Plan: Impression: Persistent partial small bowel obstruction versus ileus.  NG tube had to be replaced yesterday evening. Plan: Continue NG tube compression for today.  We will replace magnesium for hypomagnesemia.  Patient is already having replacement for hypokalemia.  Will reassess in a.m.  May get small bowel obstruction protocol series tomorrow.  LOS: 3 days    Aviva Signs 06/06/2022

## 2022-06-06 NOTE — Progress Notes (Signed)
Attending made aware of patient status and complaint via secure chat. New orders placed.

## 2022-06-07 ENCOUNTER — Inpatient Hospital Stay (HOSPITAL_COMMUNITY): Payer: Medicare Other

## 2022-06-07 DIAGNOSIS — K56609 Unspecified intestinal obstruction, unspecified as to partial versus complete obstruction: Secondary | ICD-10-CM | POA: Diagnosis not present

## 2022-06-07 LAB — COMPREHENSIVE METABOLIC PANEL
ALT: 11 U/L (ref 0–44)
AST: 14 U/L — ABNORMAL LOW (ref 15–41)
Albumin: 3.2 g/dL — ABNORMAL LOW (ref 3.5–5.0)
Alkaline Phosphatase: 54 U/L (ref 38–126)
Anion gap: 12 (ref 5–15)
BUN: 31 mg/dL — ABNORMAL HIGH (ref 8–23)
CO2: 31 mmol/L (ref 22–32)
Calcium: 7.7 mg/dL — ABNORMAL LOW (ref 8.9–10.3)
Chloride: 99 mmol/L (ref 98–111)
Creatinine, Ser: 1.88 mg/dL — ABNORMAL HIGH (ref 0.61–1.24)
GFR, Estimated: 38 mL/min — ABNORMAL LOW (ref >60)
Glucose, Bld: 156 mg/dL — ABNORMAL HIGH (ref 70–99)
Potassium: 3.4 mmol/L — ABNORMAL LOW (ref 3.5–5.1)
Sodium: 142 mmol/L (ref 135–145)
Total Bilirubin: 1.2 mg/dL (ref 0.3–1.2)
Total Protein: 7 g/dL (ref 6.5–8.1)

## 2022-06-07 LAB — CBC
HCT: 38.4 % — ABNORMAL LOW (ref 39.0–52.0)
Hemoglobin: 12.4 g/dL — ABNORMAL LOW (ref 13.0–17.0)
MCH: 31.7 pg (ref 26.0–34.0)
MCHC: 32.3 g/dL (ref 30.0–36.0)
MCV: 98.2 fL (ref 80.0–100.0)
Platelets: 267 10*3/uL (ref 150–400)
RBC: 3.91 MIL/uL — ABNORMAL LOW (ref 4.22–5.81)
RDW: 13.3 % (ref 11.5–15.5)
WBC: 9.6 10*3/uL (ref 4.0–10.5)
nRBC: 0 % (ref 0.0–0.2)

## 2022-06-07 LAB — GLUCOSE, CAPILLARY
Glucose-Capillary: 136 mg/dL — ABNORMAL HIGH (ref 70–99)
Glucose-Capillary: 129 mg/dL — ABNORMAL HIGH (ref 70–99)
Glucose-Capillary: 138 mg/dL — ABNORMAL HIGH (ref 70–99)
Glucose-Capillary: 111 mg/dL — ABNORMAL HIGH (ref 70–99)

## 2022-06-07 LAB — CALCIUM, IONIZED: Calcium, Ionized, Serum: 4.2 mg/dL — ABNORMAL LOW (ref 4.5–5.6)

## 2022-06-07 LAB — LACTIC ACID, PLASMA: Lactic Acid, Venous: 1.1 mmol/L (ref 0.5–1.9)

## 2022-06-07 MED ORDER — POTASSIUM CHLORIDE 10 MEQ/100ML IV SOLN
10.0000 meq | INTRAVENOUS | Status: AC
  Administered 2022-06-07 (×4): 10 meq via INTRAVENOUS
  Filled 2022-06-07 (×3): qty 100

## 2022-06-07 MED ORDER — MENTHOL 3 MG MT LOZG
1.0000 | LOZENGE | OROMUCOSAL | Status: DC | PRN
Start: 2022-06-07 — End: 2022-06-10

## 2022-06-07 MED ORDER — LOSARTAN POTASSIUM 50 MG PO TABS: 100.0000 mg | ORAL_TABLET | Freq: Every day | ORAL | Status: DC

## 2022-06-07 MED ORDER — DIATRIZOATE MEGLUMINE & SODIUM 66-10 % PO SOLN
90.0000 mL | Freq: Once | ORAL | Status: AC
  Administered 2022-06-07: 90 mL via NASOGASTRIC
  Filled 2022-06-07: qty 90

## 2022-06-07 MED ORDER — DIATRIZOATE MEGLUMINE & SODIUM 66-10 % PO SOLN
ORAL | Status: AC
  Filled 2022-06-07: qty 90

## 2022-06-07 MED ORDER — MAGNESIUM SULFATE 2 GM/50ML IV SOLN
2.0000 g | Freq: Once | INTRAVENOUS | Status: AC
Start: 2022-06-07 — End: 2022-06-07
  Administered 2022-06-07: 2 g via INTRAVENOUS
  Filled 2022-06-07: qty 50

## 2022-06-07 NOTE — Progress Notes (Signed)
2 Days Post-Op  Subjective: Patient still with mild shortness of breath.  No significant bowel movement or flatus today.  Objective: Vital signs in last 24 hours: Temp:  [97.1 F (36.2 C)-98.4 F (36.9 C)] 98 F (36.7 C) (07/23 0700) Pulse Rate:  [99-106] 103 (07/23 0700) Resp:  [19-36] 27 (07/23 0700) BP: (94-129)/(49-87) 119/57 (07/23 0700) SpO2:  [92 %-100 %] 98 % (07/23 0700) Weight:  [126.3 kg] 126.3 kg (07/23 0400) Last BM Date : 06/05/22  Intake/Output from previous day: 07/22 0701 - 07/23 0700 In: 626.9 [P.O.:50; IV Piggyback:576.9] Out: 3300 [Urine:800; Emesis/NG output:2500] Intake/Output this shift: No intake/output data recorded.  General appearance: alert, cooperative, and no distress GI: Soft, nontender, nondistended.  No significant bowel sounds appreciated.  Lab Results:  Recent Labs    06/06/22 0205 06/07/22 0416  WBC 10.6* 9.6  HGB 12.7* 12.4*  HCT 38.6* 38.4*  PLT 262 267   BMET Recent Labs    06/06/22 0205 06/07/22 0416  NA 144 142  K 3.0* 3.4*  CL 100 99  CO2 32 31  GLUCOSE 239* 156*  BUN 23 31*  CREATININE 1.53* 1.88*  CALCIUM 7.5* 7.7*   PT/INR No results for input(s): "LABPROT", "INR" in the last 72 hours.  Studies/Results: DG Abd 1 View  Result Date: 06/06/2022 CLINICAL DATA:  Nasogastric tube. EXAM: ABDOMEN - 1 VIEW COMPARISON:  06/06/2022. FINDINGS: A mildly distended loop of small bowel is noted in the upper abdomen measuring 3.2 cm. An NG tube terminates in the stomach in the stomach is decompressed from the prior exam. No radio-opaque calculi. Mild atelectasis is present at the left lung base. IMPRESSION: 1. Mildly distended loop of small bowel in the left upper quadrant, not significantly changed from the prior exam. 2. An enteric tube terminates in the stomach in the stomach is decompressed from the prior exam. Electronically Signed   By: Brett Fairy M.D.   On: 06/06/2022 04:05   DG Abd 1 View  Result Date:  06/06/2022 CLINICAL DATA:  Small-bowel obstruction. EXAM: ABDOMEN - 1 VIEW COMPARISON:  06/05/2022. FINDINGS: Examination is limited due to patient's body habitus resulting in artifact. Mildly distended gas-filled loops of small bowel are noted in the abdomen measuring up to 3.8 cm. There is gaseous distention of the stomach. Mild residual contrast is noted in the in the colon. No radio-opaque calculi or other acute radiographic abnormality are seen. IMPRESSION: Mildly distended gas-filled loops of small bowel in the abdomen measuring up to 3.8 cm, slightly increased from the prior exam. Electronically Signed   By: Brett Fairy M.D.   On: 06/06/2022 02:21   DG Abd 1 View  Result Date: 06/05/2022 CLINICAL DATA:  Follow-up SBO EXAM: ABDOMEN - 1 VIEW COMPARISON:  Abdominal radiograph dated June 12, 2022 FINDINGS: Enteric tube tip and side port project over the stomach. Contrast material is seen throughout the colon. Mildly distended gas-filled loops of small bowel are seen in the right hemiabdomen. No contrast is seen in the rectum. IMPRESSION: Contrast material seen in the colon. Electronically Signed   By: Yetta Glassman M.D.   On: 06/05/2022 08:27    Anti-infectives: Anti-infectives (From admission, onward)    None       Assessment/Plan: Impression: Persistent small bowel obstruction.  Still with increased NG tube output.  We will get small bowel obstruction protocol series today.  Further management is pending those results.  LOS: 4 days    Aviva Signs 06/07/2022

## 2022-06-07 NOTE — Progress Notes (Signed)
Pt has no uop this shift bladder scan reveled 123-160m, notified Dr. ZCarmin Muskrat Creat bumped 1.88, K+ 3.3. pt very upset that he couldn't have soda or juice this evening because day shift let him have it. Explained to pt that the only order he has is for [employer]. Spoke to Dr. ZClearence Pedand was told to keep pt NPO with Ice Chips only. NGT remained to LIS, pt had no problems with nausea and still is not passing gas.

## 2022-06-07 NOTE — Progress Notes (Signed)
PROGRESS NOTE    Patient: Colton Jackson.                            PCP: Sharilyn Sites, MD                    DOB: 1951-01-22            DOA: 06/03/2022 AOZ:[id number]             DOS: 06/07/2022, 10:35 AM   LOS: 4 days   Date of Service: The patient was seen and examined on 06/07/2022  Subjective:   The patient was seen and examined this morning, laying comfortably in bed, complaining of severe generalized weaknesses, shortness of breath... Stating that he is thirsty Denies any nausea vomiting abdominal pain, bowel movement He may have had gas earlier this morning Satting 98% on 6 L of oxygen  Awake alert, following commands   Brief Narrative:   Bishop Vanderwerf. is a 71 y.o. male with medical history significant of neck artery aneurysm, hypertension, GERD, OSA, thyroid nodule, diabetes mellitus type 2, and more presents the ED with a chief complaint of abdominal distention and nausea vomiting.   Patient reports that his abdomen started getting distended the day prior to presentation.  On the day of presentation, he reports vomiting that first look like undigested food and then lifted and felt like feces.  Patient reports no blood in his emesis, but the NG tube does have blood in the output.  Patient reports that his last bowel movement was 1 week ago.  Approximately 6 weeks ago he had 5-6 days of diarrhea that spontaneously resolved.  Patient reports he has had abdominal pain for approximately 1 week that feels like fullness.  Reports has been progressively worse since it started.  He has never had symptoms like this before.  He does report chest pain that is associated with the abdomen.  He reports that it feels like indigestion coming up from his abdomen.  He has had dyspnea that is chronic for him.  He is dyspnea is worse with exertion.  He wears 4 L nasal cannula at home.  Patient denies any fevers, chills, or other symptoms.   Patient does not smoke, does not drink, does not use  illicit drugs.  He is vaccinated for COVID.  Patient is full code.  ED:  Temp 98.1, heart rate 100-111, respiratory rate 10-40, blood pressure 107/55-174/109, satting 85-100% Leukocytosis 14.5, hemoglobin 13.1 Hypokalemia at 3.4 CT abdomen pelvis shows small bowel obstruction with right lower quadrant transition zone likely due to adhesions.  Patient does have a history of appendectomy as a child General surgery was consulted and recommends suppositories, NG tube, n.p.o. Admission was requested for small bowel obstruction management    Assessment & Plan:   Principal Problem:   SBO (small bowel obstruction) (HCC) Active Problems:   Acute on chronic respiratory failure with hypoxia (HCC)   Essential hypertension   Elevated serum creatinine   Diabetes mellitus type 2 in obese (HCC)   Leukocytosis     Assessment and Plan: * SBO (small bowel obstruction) ([place]) - General surgery following closely -NG tube still in place -Still having inconsistent small BMs -KUB this a.m. revealing contrast in colon Repeating KUB in a.m. 06/07/2022  -N.p.o.  -Discontinuing IV fluids..  As patient's shortness of breath is getting worse, volume overload  -As needed analgesics -Hemoccult also positive, monitoring closely  Acute on chronic respiratory failure with hypoxia (HCC) -At baseline 6 L of oxygen, satting 98% - patient was as high as 11 L of oxygen, currently down to 6 L of oxygen but satting 98% -Still tachypneic, tachycardic -Hypoxic off supplemental oxygen   -IV fluids discontinued, dose of Lasix was given x2 06/05/2022 Chest x-ray obtained: Revealing;  cardiomegaly and vascular congestion. 2. Streaky density in the bilateral lungs which could be atelectasis or infection :  Leukocytosis - White blood cell count 14.5 >> 9.3 >>8.7, 10.6, 9.6 -Technically meets SIRS criteria, but likely from pain and not from infection, afebrile, -Leukocytosis is thought to be secondary to SBO -No  antibiotics at this time -Monitoring closely  Diabetes mellitus type 2 in obese (HCC) - Hold metformin and glipizide CBG (last 3)  Recent Labs    06/06/22 1613 06/06/22 2101 06/07/22 0729  GLUCAP 185* 157* 136*     -Checking CBG every 4-6 hours forward SSI coverage -N.p.o. -Continue to monitor -Adding long-acting insulin-increasing dose accordingly  Elevated serum creatinine - Likely at baseline, he was trending up -Discontinue IV fluids -Trending creatinine 1.27, 1.23, 1.33, 1.53, 1.88  today -Avoiding nephrotoxins    Essential hypertension -Holding amlodipine, clonidine, metoprolol Till  tolerating p.o. -Holding losartan -Continue to monitor -Blood pressure running soft -Stable, as needed hydralazine    ----------------------------------------------------------------------------------------------------------------------------------------------- Obesity - nutritional status:  The patient's BMI is: Body mass index is 43.61 kg/m. I agree with the assessment and plan as outlined  Currently n.p.o. --------------------------------------------------------------------------------------------------------------------------------------------  DVT prophylaxis:  heparin injection 5,000 Units Start: 06/04/22 0600 SCDs Start: 06/04/22 0406   Code Status:   Code Status: Full Code  Family Communication: No family member present at bedside- attempt will be made to update daily The above findings and plan of care has been discussed with patient (and family)  in detail,  they expressed understanding and agreement of above. -Advance care planning has been discussed.   Admission status:   Status is: Inpatient Remains inpatient appropriate because: Needing surgical management including NG tube, IV fluids, surgery evaluation for possible intervention     Procedures:   No admission procedures for hospital encounter.   Antimicrobials:  Anti-infectives (From admission,  onward)    None        Medication:   bisacodyl  10 mg Rectal Once   bisacodyl  10 mg Rectal BID   Chlorhexidine Gluconate Cloth  6 each Topical Daily   cromolyn  1 spray Each Nare TID   diatrizoate meglumine-sodium       heparin  5,000 Units Subcutaneous Q8H   insulin aspart  0-15 Units Subcutaneous TID WC   insulin aspart  0-5 Units Subcutaneous QHS   insulin detemir  10 Units Subcutaneous BID   metoprolol succinate  50 mg Oral Daily   pantoprazole (PROTONIX) IV  40 mg Intravenous Daily   sennosides  5 mL Oral BID    acetaminophen **OR** acetaminophen, diatrizoate meglumine-sodium, hydrALAZINE, levalbuterol, menthol-cetylpyridinium, metoprolol tartrate, morphine injection, ondansetron **OR** ondansetron (ZOFRAN) IV, oxyCODONE, phenol   Objective:   Vitals:   06/07/22 0400 06/07/22 0500 06/07/22 0600 06/07/22 0700  BP: (!) 94/52 (!) 124/53 (!) 116/49 (!) 119/57  Pulse: 100 (!) 103 (!) 103 (!) 103  Resp: (!) _0 (!) 27  Temp: (!) 97.5 F (36.4 C)   98 F (36.7 C)  TempSrc: Axillary   Oral  SpO2: 99% 100% 96% 98%  Weight: 126.3 kg     Height:  Intake/Output Summary (Last 24 hours) at 06/07/2022 1035 Last data filed at 06/07/2022 0400 Gross per 24 hour  Intake 566.09 ml  Output 2900 ml  Net -2333.91 ml   Filed Weights   06/04/22 0614 06/07/22 0400  Weight: 128.7 kg 126.3 kg     Examination:      Physical Exam:   General:  AAO x 3,  cooperative, no distress;   HEENT:  NG tube in place  normocephalic, PERRL, otherwise with in Normal limits   Neuro:  CNII-XII intact. , normal motor and sensation, reflexes intact   Lungs:   Clear to auscultation BL, Respirations unlabored,  No wheezes / crackles  Cardio:    S1/S2, RRR, No murmure, No Rubs or Gallops   Abdomen:  Soft, mildly distended, hypoactive bowel sounds, no guarding or peritoneal signs.  Muscular  skeletal:  Limited exam -global generalized weaknesses - in bed, able to move all 4  extremities,   2+ pulses,  symmetric, No pitting edema  Skin:  Dry, warm to touch, negative for any Rashes,  Wounds: Please see nursing documentation           ------------------------------------------------------------------------------------------------------------------------------------------    LABs:     Latest Ref Rng & Units 06/07/2022    4:16 AM 06/06/2022    2:05 AM 06/05/2022    6:47 AM  CBC  WBC 4.0 - 10.5 K/uL 9.6  10.6  8.7   Hemoglobin 13.0 - 17.0 g/dL 12.4  12.7  12.6   Hematocrit 39.0 - 52.0 % 38.4  38.6  39.5   Platelets 150 - 400 K/uL 267  262  262       Latest Ref Rng & Units 06/07/2022    4:16 AM 06/06/2022    2:05 AM 06/05/2022    6:47 AM  CMP  Glucose 70 - 99 mg/dL 156  239  184   BUN 8 - 23 mg/dL _0 Creatinine 0.61 - 1.24 mg/dL 1.88  1.53  1.33   Sodium 135 - 145 mmol/L 142  144  145   Potassium 3.5 - 5.1 mmol/L 3.4  3.0  3.5   Chloride 98 - 111 mmol/L 99  100  99   CO2 22 - 32 mmol/L 31  32  31   Calcium 8.9 - 10.3 mg/dL 7.7  7.5  7.8   Total Protein 6.5 - 8.1 g/dL 7.0  7.7  7.5   Total Bilirubin 0.3 - 1.2 mg/dL 1.2  1.3  1.3   Alkaline Phos 38 - 126 U/L 54  59  53   AST 15 - 41 U/L _1 ALT 0 - 44 U/L _2 Micro Results Recent Results (from the past 240 hour(s))  MRSA Next Gen by PCR, Nasal     Status: None   Collection Time: 06/05/22  3:15 PM   Specimen: Nasal Mucosa; Nasal Swab  Result Value Ref Range Status   MRSA by PCR Next Gen NOT DETECTED NOT DETECTED Final    Comment: (NOTE) The GeneXpert MRSA Assay (FDA approved for NASAL specimens only), is one component of a comprehensive MRSA colonization surveillance program. It is not intended to diagnose MRSA infection nor to guide or monitor treatment for MRSA infections. Test performance is not FDA approved in patients less than 54 years old. Performed at [facility], [address] [place], [place] [address]  Radiology Reports DG Abd Portable  1V-Small Bowel Protocol-Position Verification  Result Date: 06/07/2022 CLINICAL DATA:  Nasogastric tube placement. EXAM: PORTABLE ABDOMEN - 1 VIEW COMPARISON:  06/06/2022 FINDINGS: Nasogastric tube is looped once over the gastric fundus and has tip over the right upper quadrant likely over the distal stomach or proximal duodenum as this is unchanged. Remainder of the exam is unchanged. IMPRESSION: Nasogastric tube with tip over the right upper quadrant likely over the distal stomach or proximal duodenum unchanged. Electronically Signed   By: Marin Olp M.D.   On: 06/07/2022 08:32    SIGNED: Deatra Rutilio, MD, FHM. [facility],  Pager (please use [email or web address] to page/text) Please use Epic Secure Chat for non-urgent communication (7AM-7PM)  If 7PM-7AM, please contact night-coverage [email or web address], 06/07/2022, 10:35 AM

## 2022-06-08 ENCOUNTER — Inpatient Hospital Stay (HOSPITAL_COMMUNITY): Payer: Medicare Other

## 2022-06-08 ENCOUNTER — Other Ambulatory Visit (HOSPITAL_COMMUNITY): Payer: Self-pay | Admitting: *Deleted

## 2022-06-08 DIAGNOSIS — R0609 Other forms of dyspnea: Secondary | ICD-10-CM

## 2022-06-08 DIAGNOSIS — J9622 Acute and chronic respiratory failure with hypercapnia: Secondary | ICD-10-CM | POA: Diagnosis not present

## 2022-06-08 DIAGNOSIS — R5381 Other malaise: Secondary | ICD-10-CM | POA: Diagnosis present

## 2022-06-08 DIAGNOSIS — I272 Pulmonary hypertension, unspecified: Secondary | ICD-10-CM | POA: Diagnosis not present

## 2022-06-08 DIAGNOSIS — J9621 Acute and chronic respiratory failure with hypoxia: Secondary | ICD-10-CM | POA: Diagnosis not present

## 2022-06-08 DIAGNOSIS — K56609 Unspecified intestinal obstruction, unspecified as to partial versus complete obstruction: Secondary | ICD-10-CM | POA: Diagnosis not present

## 2022-06-08 LAB — COMPREHENSIVE METABOLIC PANEL
ALT: 16 U/L (ref 0–44)
AST: 23 U/L (ref 15–41)
Albumin: 3.2 g/dL — ABNORMAL LOW (ref 3.5–5.0)
Alkaline Phosphatase: 57 U/L (ref 38–126)
Anion gap: 12 (ref 5–15)
BUN: 26 mg/dL — ABNORMAL HIGH (ref 8–23)
CO2: 29 mmol/L (ref 22–32)
Calcium: 7.9 mg/dL — ABNORMAL LOW (ref 8.9–10.3)
Chloride: 101 mmol/L (ref 98–111)
Creatinine, Ser: 1.56 mg/dL — ABNORMAL HIGH (ref 0.61–1.24)
GFR, Estimated: 48 mL/min — ABNORMAL LOW (ref >60)
Glucose, Bld: 131 mg/dL — ABNORMAL HIGH (ref 70–99)
Potassium: 3.2 mmol/L — ABNORMAL LOW (ref 3.5–5.1)
Sodium: 142 mmol/L (ref 135–145)
Total Bilirubin: 1.4 mg/dL — ABNORMAL HIGH (ref 0.3–1.2)
Total Protein: 7 g/dL (ref 6.5–8.1)

## 2022-06-08 LAB — CBC
HCT: 37.3 % — ABNORMAL LOW (ref 39.0–52.0)
Hemoglobin: 11.9 g/dL — ABNORMAL LOW (ref 13.0–17.0)
MCH: 31.2 pg (ref 26.0–34.0)
MCHC: 31.9 g/dL (ref 30.0–36.0)
MCV: 97.6 fL (ref 80.0–100.0)
Platelets: 314 10*3/uL (ref 150–400)
RBC: 3.82 MIL/uL — ABNORMAL LOW (ref 4.22–5.81)
RDW: 13.2 % (ref 11.5–15.5)
WBC: 11.1 10*3/uL — ABNORMAL HIGH (ref 4.0–10.5)
nRBC: 0 % (ref 0.0–0.2)

## 2022-06-08 LAB — GLUCOSE, CAPILLARY
Glucose-Capillary: 132 mg/dL — ABNORMAL HIGH (ref 70–99)
Glucose-Capillary: 179 mg/dL — ABNORMAL HIGH (ref 70–99)
Glucose-Capillary: 244 mg/dL — ABNORMAL HIGH (ref 70–99)
Glucose-Capillary: 190 mg/dL — ABNORMAL HIGH (ref 70–99)

## 2022-06-08 LAB — ECHOCARDIOGRAM COMPLETE
Area-P 1/2: 3.6 cm2
Height: 67 in
S' Lateral: 2.1 cm
Weight: 4384.51 oz

## 2022-06-08 MED ORDER — SENNOSIDES 8.8 MG/5ML PO SYRP
10.0000 mL | ORAL_SOLUTION | Freq: Two times a day (BID) | ORAL | Status: DC
  Administered 2022-06-08: 10 mL via ORAL
  Filled 2022-06-08 (×9): qty 10

## 2022-06-08 MED ORDER — FAMOTIDINE 20 MG PO TABS
20.0000 mg | ORAL_TABLET | Freq: Two times a day (BID) | ORAL | Status: DC
  Administered 2022-06-08 – 2022-06-10 (×4): 20 mg via ORAL
  Filled 2022-06-08 (×4): qty 1

## 2022-06-08 MED ORDER — FAMOTIDINE 40 MG/5ML PO SUSR: 20.0000 mg | Freq: Two times a day (BID) | ORAL | Status: DC

## 2022-06-08 NOTE — Progress Notes (Signed)
PROGRESS NOTE    Patient: Colton Jackson.                            PCP: Sharilyn Sites, MD                    DOB: June 07, 1951            DOA: 06/03/2022 ZOX:[id number]             DOS: 06/08/2022, 9:25 AM   LOS: 5 days   Date of Service: The patient was seen and examined on 06/08/2022  Subjective:   The patient was seen and examined this morning, stable, reporting 1 bowel movement with some gas. Hemodynamically stable no further issues overnight Complaining of severe generalized weaknesses 100% on 5 L of oxygen   Brief Narrative:   Colton Jackson. is a 71 y.o. male with medical history significant of neck artery aneurysm, hypertension, GERD, OSA, thyroid nodule, diabetes mellitus type 2, and more presents the ED with a chief complaint of abdominal distention and nausea vomiting.   Patient reports that his abdomen started getting distended the day prior to presentation.  On the day of presentation, he reports vomiting that first look like undigested food and then lifted and felt like feces.  Patient reports no blood in his emesis, but the NG tube does have blood in the output.  Patient reports that his last bowel movement was 1 week ago.  Approximately 6 weeks ago he had 5-6 days of diarrhea that spontaneously resolved.  Patient reports he has had abdominal pain for approximately 1 week that feels like fullness.  Reports has been progressively worse since it started.  He has never had symptoms like this before.  He does report chest pain that is associated with the abdomen.  He reports that it feels like indigestion coming up from his abdomen.  He has had dyspnea that is chronic for him.  He is dyspnea is worse with exertion.  He wears 4 L nasal cannula at home.  Patient denies any fevers, chills, or other symptoms.   Patient does not smoke, does not drink, does not use illicit drugs.  He is vaccinated for COVID.  Patient is full code.  ED:  Temp 98.1, heart rate 100-111, respiratory rate  10-40, blood pressure 107/55-174/109, satting 85-100% Leukocytosis 14.5, hemoglobin 13.1 Hypokalemia at 3.4 CT abdomen pelvis shows small bowel obstruction with right lower quadrant transition zone likely due to adhesions.  Patient does have a history of appendectomy as a child General surgery was consulted and recommends suppositories, NG tube, n.p.o. Admission was requested for small bowel obstruction management    Assessment & Plan:   Principal Problem:   SBO (small bowel obstruction) (HCC) Active Problems:   Acute on chronic respiratory failure with hypoxia (HCC)   Essential hypertension   Elevated serum creatinine   Diabetes mellitus type 2 in obese (HCC)   Leukocytosis   Debility     Assessment and Plan: * SBO (small bowel obstruction) (HCC) -KUB small bowel follow-through revealing contrast throughout the colon -General surgery following closely -NG tube in place, likely to be discontinued and clamped today -General surgery agreed to start the patient on clear liquid diet -Bowel movement x1 and gas in last 24 hours   -Discontinuing IV fluids..  As patient's shortness of breath is getting worse, volume overload  -As needed analgesics -Hemoccult also positive, monitoring closely  Acute  on chronic respiratory failure with hypoxia (HCC) -Back to baseline, 5-6 L of oxygen, satting 100%  - patient was as high as 11 L of oxygen,\ -Still tachypneic, tachycardic -Hypoxic off supplemental oxygen   -IV fluids discontinued, dose of Lasix was given x2 06/05/2022 Chest x-ray obtained: Revealing;  cardiomegaly and vascular congestion. 2. Streaky density in the bilateral lungs which could be atelectasis or infection :  Debility - Acute on chronic -PT/OT evaluation recommendation, fall precautions  Leukocytosis - White blood cell count 14.5 >> 9.3 >>8.7, 10.6, 9.6, 11.1 -Technically meets SIRS criteria, but likely from pain and not from infection,  afebrile, -Leukocytosis is thought to be secondary to SBO -No antibiotics at this time -Monitoring closely  Diabetes mellitus type 2 in obese (HCC) - Hold metformin and glipizide CBG (last 3)  Recent Labs    06/07/22 1636 06/07/22 2119 06/08/22 0731  GLUCAP 138* 111* 132*     -Tolerating p.o., switching CBG to q. ACHS with SSI coverage -Starting clear liquid diet, titrating insulin accordingly -Continue to monitor -Adding long-acting insulin-increasing dose accordingly  Elevated serum creatinine - Likely at baseline, he was trending up -Discontinue IV fluids -Trending creatinine 1.27, 1.23, 1.33, 1.53, 1.88, 1.56 today -Avoiding nephrotoxins    Essential hypertension -Holding amlodipine, clonidine, losartan Till  tolerating p.o. -Continue Toprol-XL  -Continue to monitor -BP stabilizing -Stable, as needed hydralazine    ----------------------------------------------------------------------------------------------------------------------------------------------- Obesity - nutritional status:  The patient's BMI is: Body mass index is 42.92 kg/m. I agree with the assessment and plan as outlined  Currently n.p.o. --------------------------------------------------------------------------------------------------------------------------------------------  DVT prophylaxis:  heparin injection 5,000 Units Start: 06/04/22 0600 SCDs Start: 06/04/22 0406   Code Status:   Code Status: Full Code  Family Communication: No family member present at bedside- attempt will be made to update daily The above findings and plan of care has been discussed with patient (and family)  in detail,  they expressed understanding and agreement of above. -Advance care planning has been discussed.   Admission status:   Status is: Inpatient Remains inpatient appropriate because: Needing surgical management including NG tube, IV fluids, surgery evaluation for possible  intervention     Procedures:   No admission procedures for hospital encounter.   Antimicrobials:  Anti-infectives (From admission, onward)    None        Medication:   bisacodyl  10 mg Rectal Once   bisacodyl  10 mg Rectal BID   Chlorhexidine Gluconate Cloth  6 each Topical Daily   cromolyn  1 spray Each Nare TID   heparin  5,000 Units Subcutaneous Q8H   insulin aspart  0-15 Units Subcutaneous TID WC   insulin aspart  0-5 Units Subcutaneous QHS   insulin detemir  10 Units Subcutaneous BID   metoprolol succinate  50 mg Oral Daily   pantoprazole (PROTONIX) IV  40 mg Intravenous Daily   sennosides  10 mL Oral BID    acetaminophen **OR** acetaminophen, hydrALAZINE, levalbuterol, menthol-cetylpyridinium, metoprolol tartrate, morphine injection, ondansetron **OR** ondansetron (ZOFRAN) IV, oxyCODONE, phenol   Objective:   Vitals:   06/08/22 0711 06/08/22 0730 06/08/22 0800 06/08/22 0900  BP:   131/74 (!) 142/64  Pulse:   (!) 102 (!) 103  Resp: 18  20 (!) 22  Temp:  98.9 F (37.2 C)    TempSrc:  Oral    SpO2:   97% 100%  Weight:      Height:        Intake/Output Summary (Last 24 hours) at 06/08/2022 [phone]  Last data filed at 06/08/2022 0300 Gross per 24 hour  Intake 1250 ml  Output 3175 ml  Net -1925 ml   Filed Weights   06/04/22 0614 06/07/22 0400 06/08/22 0300  Weight: 128.7 kg 126.3 kg 124.3 kg     Examination:      Physical Exam:   General:  AAO x 3,  cooperative, no distress;   HEENT:  Normocephalic, PERRL, otherwise with in Normal limits   Neuro:  CNII-XII intact. , normal motor and sensation, reflexes intact   Lungs:   Clear to auscultation BL, Respirations unlabored,  No wheezes / crackles  Cardio:    S1/S2, RRR, No murmure, No Rubs or Gallops   Abdomen:  Soft, non-tender, positive bowel sounds but hypoactive no guarding or peritoneal signs.  Muscular  skeletal:  Limited exam -global generalized weaknesses - in bed, able to move all 4  extremities,   2+ pulses,  symmetric, No pitting edema  Skin:  Dry, warm to touch, negative for any Rashes,  Wounds: Please see nursing documentation             ------------------------------------------------------------------------------------------------------------------------------------------    LABs:     Latest Ref Rng & Units 06/08/2022    5:00 AM 06/07/2022    4:16 AM 06/06/2022    2:05 AM  CBC  WBC 4.0 - 10.5 K/uL 11.1  9.6  10.6   Hemoglobin 13.0 - 17.0 g/dL 11.9  12.4  12.7   Hematocrit 39.0 - 52.0 % 37.3  38.4  38.6   Platelets 150 - 400 K/uL 314  267  262       Latest Ref Rng & Units 06/08/2022    5:01 AM 06/07/2022    4:16 AM 06/06/2022    2:05 AM  CMP  Glucose 70 - 99 mg/dL 131  156  239   BUN 8 - 23 mg/dL _0 Creatinine 0.61 - 1.24 mg/dL 1.56  1.88  1.53   Sodium 135 - 145 mmol/L 142  142  144   Potassium 3.5 - 5.1 mmol/L 3.2  3.4  3.0   Chloride 98 - 111 mmol/L 101  99  100   CO2 22 - 32 mmol/L 29  31  32   Calcium 8.9 - 10.3 mg/dL 7.9  7.7  7.5   Total Protein 6.5 - 8.1 g/dL 7.0  7.0  7.7   Total Bilirubin 0.3 - 1.2 mg/dL 1.4  1.2  1.3   Alkaline Phos 38 - 126 U/L 57  54  59   AST 15 - 41 U/L _1 ALT 0 - 44 U/L _2 Micro Results Recent Results (from the past 240 hour(s))  MRSA Next Gen by PCR, Nasal     Status: None   Collection Time: 06/05/22  3:15 PM   Specimen: Nasal Mucosa; Nasal Swab  Result Value Ref Range Status   MRSA by PCR Next Gen NOT DETECTED NOT DETECTED Final    Comment: (NOTE) The GeneXpert MRSA Assay (FDA approved for NASAL specimens only), is one component of a comprehensive MRSA colonization surveillance program. It is not intended to diagnose MRSA infection nor to guide or monitor treatment for MRSA infections. Test performance is not FDA approved in patients less than 61 years old. Performed at [facility], [address], [place], [place] [address]     Radiology Reports DG Abd  Portable  1V-Small Bowel Obstruction Protocol-initial, 8 hr delay  Result Date: 06/07/2022 CLINICAL DATA:  Small-bowel obstruction, 8 hour delay EXAM: PORTABLE ABDOMEN - 1 VIEW COMPARISON:  06/07/2022, 8:17 a.m. FINDINGS: Nonobstructive pattern of bowel gas. The colon is opacified in its entirety by enteric contrast. Esophagogastric tube with tip and side port below the diaphragm. IMPRESSION: Nonobstructive pattern of bowel gas. The colon is opacified in its entirety by enteric contrast. Electronically Signed   By: Delanna Ahmadi M.D.   On: 06/07/2022 19:11    SIGNED: Deatra Nate, MD, FHM. [facility],  Pager (please use [email or web address] to page/text) Please use Epic Secure Chat for non-urgent communication (7AM-7PM)  If 7PM-7AM, please contact night-coverage [email or web address], 06/08/2022, 9:25 AM

## 2022-06-08 NOTE — Progress Notes (Signed)
NAME:  Colton Zweber., MRN:  [id number], DOB:  [birth year], LOS: 5 ADMISSION DATE:  06/03/2022, CONSULTATION DATE:  06/05/22 REFERRING MD:  [facility]ad , CHIEF COMPLAINT:  sob    History of Present Illness:  68 ywobm remote smoker  with medical history significant of neck artery aneurysm, hypertension, GERD, OSA/OHS , thyroid nodule, diabetes mellitus type 2, and more presents the ED with a chief complaint of abdominal distention and nausea vomiting.  Patient reports that  abdomen pain x 1 week and noted started getting distended the day prior to admit   with onset of feculent vomitus.     Patient reported that his last bowel movement was 1 week PTA  Approximately 6 weeks PTA he had 5-6 days of diarrhea that spontaneously resolved   Chronically  he has had doe x room to room on 4lpm rest and up to 6 lpm with ex and also on cpap per Dr Annamaria Boots at [facility].  Prior pfts reviewed and show only retriction, no  significant obstruction.   PCCM consulted am 7/21 p developing worse sob at rest in setting of worse abd distention/ pain - no pleuritic cp/ no cough and he does not think he choked on vomit earlier in admit.   Significant Hospital Events: Including procedures, antibiotic start and stop dates in addition to other pertinent events   CT abd 7/19 1. Small bowel obstruction with transition point in the right lower quadrant, likely due to adhesions.Large volume of stool in the ascending, transverse, and descending colon, consistent with constipation.Nonobstructing left nephrolithiasis. . Mild subjective hepatic steatosis.   Scheduled Meds:  bisacodyl  10 mg Rectal Once   bisacodyl  10 mg Rectal BID   Chlorhexidine Gluconate Cloth  6 each Topical Daily   cromolyn  1 spray Each Nare TID   famotidine  20 mg Oral BID   heparin  5,000 Units Subcutaneous Q8H   insulin aspart  0-15 Units Subcutaneous TID WC   insulin aspart  0-5 Units Subcutaneous QHS   insulin detemir  10 Units Subcutaneous BID   metoprolol  succinate  50 mg Oral Daily   sennosides  10 mL Oral BID   Continuous Infusions: PRN Meds:.acetaminophen **OR** acetaminophen, hydrALAZINE, levalbuterol, menthol-cetylpyridinium, metoprolol tartrate, morphine injection, ondansetron **OR** ondansetron (ZOFRAN) IV, oxyCODONE, phenol  Interim History / Subjective:   Able to ambulate. Had a bowel movement. Down to 5 L nasal cannula  Objective   Blood pressure (!) 136/59, pulse 91, temperature 98.8 F (37.1 C), temperature source Oral, resp. rate (!) 22, height _0  (1.702 m), weight 124.3 kg, SpO2 99 %.        Intake/Output Summary (Last 24 hours) at 06/08/2022 1428 Last data filed at 06/08/2022 0300 Gross per 24 hour  Intake 1250 ml  Output 2575 ml  Net -1325 ml    Filed Weights   06/04/22 0614 06/07/22 0400 06/08/22 0300  Weight: 128.7 kg 126.3 kg 124.3 kg    Examination: General appearance:    obese bm , ambulating, no distress  No jvd Oropharynx clear,  mucosa nl Neck supple Lungs decreased breath sounds bilateral, no rhonchi, no accessory muscle use RRR S1-S2 regular, no S3 or murmur Abd obese with very limited  excursion / mild gen tenderness s guarding Extr warm with  1+ bitting bilateral edema - no clubbing noted Neuro alert, interactive, nonfocal    Labs show mild hypokalemia, creatinine dropped to 1.5, mild leukocytosis   I personally reviewed images and agree with radiology  impression as follows:  CXR:   portable 7/21 1. Cardiomegaly and vascular congestion. 2. Streaky density in the bilateral lungs which could be atelectasis or infection.  X-ray abdomen shows nonobstructive pattern of bowel gas    Assessment & Plan:  1)  Acute on chronic hypoxemic/ hypercarbic Resp failure secondary to OHS plus CHF exac by very low Cabd due to obesity with superimposed SBO which is resolving >>> adjust 02 to low 90s >>> Resume nocturnal CPAP 13 cm with oxygen blended, now that SBO is resolved  2) Morbid obesity with  restrictive PFTS  as of 12/18/11  >>> no evidence of airflow obst  3) diastolic dysfunction /chf worse with  IV fluids/ now diuresing as permitted by renal function  PCCM will be available as needed  Best Practice (right click and "Reselect all SmartList Selections" daily)    Per riad   Labs   CBC: Recent Labs  Lab 06/03/22 1751 06/04/22 0432 06/05/22 0647 06/06/22 0205 06/07/22 0416 06/08/22 0500  WBC 14.5* 9.3 8.7 10.6* 9.6 11.1*  NEUTROABS 13.2* 7.6  --   --   --   --   HGB 13.1 12.2* 12.6* 12.7* 12.4* 11.9*  HCT 39.6 36.4* 39.5 38.6* 38.4* 37.3*  MCV 96.1 96.0 99.7 97.2 98.2 97.6  PLT 275 254 262 262 267 314     Basic Metabolic Panel: Recent Labs  Lab 06/04/22 0432 06/05/22 0647 06/06/22 0205 06/07/22 0416 06/08/22 0501  NA 141 145 144 142 142  K 3.6 3.5 3.0* 3.4* 3.2*  CL 95* 99 100 99 101  CO2 32 31 32 31 29  GLUCOSE 191* 184* 239* 156* 131*  BUN _0 31* 26*  CREATININE 1.23 1.33* 1.53* 1.88* 1.56*  CALCIUM 8.4* 7.8* 7.5* 7.7* 7.9*  MG 1.4*  --   --   --   --     GFR: Estimated Creatinine Clearance: 56.5 mL/min (A) (by C-G formula based on SCr of 1.56 mg/dL (H)). Recent Labs  Lab 06/04/22 0100 06/04/22 0432 06/05/22 0647 06/05/22 0833 06/06/22 0205 06/07/22 0416 06/07/22 0746 06/08/22 0500  PROCALCITON 7.86  --   --   --   --   --   --   --   WBC  --    < > 8.7  --  10.6* 9.6  --  11.1*  LATICACIDVEN  --   --   --  1.5  --   --  1.1  --    < > = values in this interval not displayed.     Liver Function Tests: Recent Labs  Lab 06/04/22 0432 06/05/22 0647 06/06/22 0205 06/07/22 0416 06/08/22 0501  AST _1 14* 23  ALT _2 ALKPHOS 50 53 59 54 57  BILITOT 0.6 1.3* 1.3* 1.2 1.4*  PROT 7.6 7.5 7.7 7.0 7.0  ALBUMIN 3.8 3.6 3.5 3.2* 3.2*    No results for input(s): "LIPASE", "AMYLASE" in the last 168 hours. No results for input(s): "AMMONIA" in the last 168 hours.  ABG    Component Value Date/Time   HCO3 36.8  (H) 06/05/2022 0648   TCO2 29 06/23/2018 0810   TCO2 30 06/23/2018 0810   O2SAT 27.6 06/05/2022 0648     Coagulation Profile: No results for input(s): "INR", "PROTIME" in the last 168 hours.  Cardiac Enzymes: No results for input(s): "CKTOTAL", "CKMB", "CKMBINDEX", "TROPONINI" in the last 168 hours.  HbA1C: Hgb A1c MFr Bld  Date/Time Value Ref  Range Status  06/04/2022 04:32 AM 6.9 (H) 4.8 - 5.6 % Final    Comment:    (NOTE) Pre diabetes:          5.7%-6.4%  Diabetes:              >6.4%  Glycemic control for   <7.0% adults with diabetes     CBG: Recent Labs  Lab 06/07/22 1107 06/07/22 1636 06/07/22 2119 06/08/22 0731 06/08/22 1106  GLUCAP 129* 138* 111* 132* [place] MD. FCCP.  Pulmonary & Critical care Pager : [phone]  If no response to pager , please call [phone] until 7 pm After 7:00 pm call Elink  [phone]   06/08/2022

## 2022-06-08 NOTE — Progress Notes (Addendum)
Progress Note  Patient Name: Colton Jackson. Date of Encounter: 06/08/2022  [facility] Cardiologist: Rozann Lesches, MD   Subjective   No chest pain, mild SOB, productive cough, beige mucus, taking liquids this morning.  Inpatient Medications    Scheduled Meds:  bisacodyl  10 mg Rectal Once   bisacodyl  10 mg Rectal BID   Chlorhexidine Gluconate Cloth  6 each Topical Daily   cromolyn  1 spray Each Nare TID   heparin  5,000 Units Subcutaneous Q8H   insulin aspart  0-15 Units Subcutaneous TID WC   insulin aspart  0-5 Units Subcutaneous QHS   insulin detemir  10 Units Subcutaneous BID   metoprolol succinate  50 mg Oral Daily   pantoprazole (PROTONIX) IV  40 mg Intravenous Daily   sennosides  10 mL Oral BID   Continuous Infusions:  famotidine (PEPCID) IV 20 mg (06/07/22 2143)   PRN Meds: acetaminophen **OR** acetaminophen, hydrALAZINE, levalbuterol, menthol-cetylpyridinium, metoprolol tartrate, morphine injection, ondansetron **OR** ondansetron (ZOFRAN) IV, oxyCODONE, phenol   Vital Signs    Vitals:   06/08/22 0100 06/08/22 0200 06/08/22 0300 06/08/22 0400  BP: 137/60 136/65 139/68 (!) 143/57  Pulse: 100 96 (!) 104 97  Resp: (!) 27 (!) 26 (!) 24 (!) 27  Temp:   99 F (37.2 C)   TempSrc:   Oral   SpO2: 100% 96% 100% 99%  Weight:   124.3 kg   Height:        Intake/Output Summary (Last 24 hours) at 06/08/2022 0755 Last data filed at 06/08/2022 0300 Gross per 24 hour  Intake 1250 ml  Output 3175 ml  Net -1925 ml      06/08/2022    3:00 AM 06/07/2022    4:00 AM 06/04/2022    6:14 AM  Last 3 Weights  Weight (lbs) 274 lb 0.5 oz 278 lb 7.1 oz 283 lb 11.7 oz  Weight (kg) 124.3 kg 126.3 kg 128.7 kg      Telemetry    SR to ST - Personally Reviewed  ECG    No new  - Personally Reviewed  Physical Exam   GEN: No acute distress.  NG in place Neck: No JVD sitting up Cardiac: RRR, no murmurs, rubs, or gallops.  Respiratory: Clear to auscultation  bilaterally.+ productive cough GI: Soft, nontender, non-distended  MS: No to tr edema; No deformity. Neuro:  Nonfocal  Psych: Normal affect   Labs    High Sensitivity Troponin:   Recent Labs  Lab 06/06/22 0205  TROPONINIHS 61*     Chemistry Recent Labs  Lab 06/04/22 0432 06/05/22 0647 06/06/22 0205 06/07/22 0416 06/08/22 0501  NA 141   < > 144 142 142  K 3.6   < > 3.0* 3.4* 3.2*  CL 95*   < > 100 99 101  CO2 32   < > 32 31 29  GLUCOSE 191*   < > 239* 156* 131*  BUN 19   < > 23 31* 26*  CREATININE 1.23   < > 1.53* 1.88* 1.56*  CALCIUM 8.4*   < > 7.5* 7.7* 7.9*  MG 1.4*  --   --   --   --   PROT 7.6   < > 7.7 7.0 7.0  ALBUMIN 3.8   < > 3.5 3.2* 3.2*  AST 24   < > 19 14* 23  ALT 15   < > _0 ALKPHOS 50   < > 59 54 57  BILITOT 0.6   < > 1.3* 1.2 1.4*  GFRNONAA >60   < > 49* 38* 48*  ANIONGAP 14   < > _0 < > = values in this interval not displayed.     Hematology Recent Labs  Lab 06/06/22 0205 06/07/22 0416 06/08/22 0500  WBC 10.6* 9.6 11.1*  RBC 3.97* 3.91* 3.82*  HGB 12.7* 12.4* 11.9*  HCT 38.6* 38.4* 37.3*  MCV 97.2 98.2 97.6  MCH 32.0 31.7 31.2  MCHC 32.9 32.3 31.9  RDW 13.0 13.3 13.2  PLT 262 267 314   Thyroid  Recent Labs  Lab 06/04/22 0432  TSH 0.855    BNP Recent Labs  Lab 06/05/22 0647 06/06/22 0727  BNP 291.0* 177.0*     Radiology    DG Abd Portable 1V-Small Bowel Obstruction Protocol-initial, 8 hr delay  Result Date: 06/07/2022 CLINICAL DATA:  Small-bowel obstruction, 8 hour delay EXAM: PORTABLE ABDOMEN - 1 VIEW COMPARISON:  06/07/2022, 8:17 a.m. FINDINGS: Nonobstructive pattern of bowel gas. The colon is opacified in its entirety by enteric contrast. Esophagogastric tube with tip and side port below the diaphragm. IMPRESSION: Nonobstructive pattern of bowel gas. The colon is opacified in its entirety by enteric contrast. Electronically Signed   By: Delanna Ahmadi M.D.   On: 06/07/2022 19:11   DG Abd Portable 1V-Small  Bowel Protocol-Position Verification  Result Date: 06/07/2022 CLINICAL DATA:  Nasogastric tube placement. EXAM: PORTABLE ABDOMEN - 1 VIEW COMPARISON:  06/06/2022 FINDINGS: Nasogastric tube is looped once over the gastric fundus and has tip over the right upper quadrant likely over the distal stomach or proximal duodenum as this is unchanged. Remainder of the exam is unchanged. IMPRESSION: Nasogastric tube with tip over the right upper quadrant likely over the distal stomach or proximal duodenum unchanged. Electronically Signed   By: Marin Olp M.D.   On: 06/07/2022 08:32    Cardiac Studies   Echocardiogram: 01/2018 Study Conclusions   - Left ventricle: The cavity size was normal. Wall thickness was    increased in a pattern of moderate LVH. Systolic function was    vigorous. The estimated ejection fraction was in the range of 65%    to 70%. Wall motion was normal; there were no regional wall    motion abnormalities. Left ventricular diastolic function    parameters were normal for the patient&'s age.  - Aortic valve: Mildly to moderately calcified annulus. Trileaflet.    Mean gradient (S): 5 mm Hg. Valve area (VTI): 3.67 cm^2.  - Mitral valve: Mildly calcified annulus. There was trivial    regurgitation.  - Right atrium: Central venous pressure (est): 8 mm Hg.  - Atrial septum: The septum bowed from right to left, consistent    with increased right atrial pressure.  - Tricuspid valve: There was mild regurgitation.  - Pulmonary arteries: Systolic pressure was severely increased. PA    peak pressure: 72 mm Hg (S).  - Pericardium, extracardiac: There was no pericardial effusion.    R/LHC: 06/2018 1. Mildly elevated filling pressures.  2. Normal cardiac output.  3. Moderate pulmonary artery hypertension.  4. No obstructive coronary disease   Pulmonary hypertension, probably group 3 from chronic bronchitis and OHS/OSA.   Patient Profile     71 y.o. male with a hx of pulmonary HTN  (felt to be Group 3), HTN, HLD, Type II DM, chronic hypoxic respiratory failure (on 4 L nasal cannula at baseline) and OSA now admitted with SBO and then acute  CHF.    Assessment & Plan    Acute Hypoxic Respiratory failure with volume overload.  --neg 12,128 since admit and wt down from 128.7 Kg to 124.3 Kg (lost 9.6 lbs) --last dose lasix 7/21 --AKI with Cr on admit 1.27 and elevated to 1.88 on the 22nd. Lasix held. --transferred to ICU with desat  requires 6 L 02 to maintain 98% sat --BNP now 177 down from 291.   Elevated troponin at 61  --likely demand ischemia from hypoxia --hx non obstructive CAD --repeat echo today   CAD- non obstructive with cath 2019.   Small bowel obstruction. , NG in place- per surgical team --Preoperative Cardiac Clearance for Exploratory Laparotomy - While his RCRI risk is at 6.6% risk of a major cardiac event, anticipate he would be at least moderate to high risk given his underlying respiratory issues. Prior catheterization in 2019 showed no obstructive CAD but he does have chronic hypoxic respiratory failure and is on 4 L nasal cannula at baseline. Unable to perform 4 METS of activity given his dyspnea but denies any recent chest pain.  HTN   --BP 143/57 to 131/74 --toprol XL 50 mg daily  amlodipine and clonidine on hold along with losartan   DM-2 /OSA PER IM      For questions or updates, please contact [place] Please consult [email or web address] for contact info under        Signed, Cecilie Kicks, NP  06/08/2022, 7:55 AM      Attending note:  Patient seen and examined.  I reviewed the chart and agree with above assessment by Ms. Ingold NP.  Mr. Methot still has NG tube in place for decompression with small bowel obstruction, although clinically improving and having liquids this morning.  Not anticipated that he will require surgery at this point.  Surgical risk would be increased with known pulmonary hypertension.  Also still with oxygen  requirement, acute on chronic hypoxic/hypercarbic respiratory failure in the setting of OHS, fluid status improving as well, and Lasix held.  He is afebrile, heart rate 90s to low 100s in sinus rhythm by telemetry which I personally reviewed.  Lungs exhibit decreased breath sounds, intermittent cough noted.  Cardiac exam with RRR no gallop, soft systolic murmur.  Pertinent lab work includes potassium 3.2, BUN 26, creatinine down to 1.56, normal AST and ALT, hemoglobin 11.9, platelets 314  We will plan a follow-up echocardiogram in comparison to prior study from 2019, both for reassessment of LVEF and diastolic dysfunction, also estimated PASP.  Continue Toprol-XL, hold Lasix as before.  Satira Sark, M.D., F.A.C.C.

## 2022-06-08 NOTE — Progress Notes (Signed)
*  PRELIMINARY RESULTS* Echocardiogram 2D Echocardiogram has been performed.  Samuel Germany 06/08/2022, 3:04 PM

## 2022-06-08 NOTE — Progress Notes (Signed)
3 Days Post-Op  Subjective: Patient had a bowel movement over the past 24 hours.  He denies any abdominal pain.  He has been given a lot of liquids.  Objective: Vital signs in last 24 hours: Temp:  [98.2 F (36.8 C)-99 F (37.2 C)] 98.9 F (37.2 C) (07/24 0730) Pulse Rate:  [93-110] 103 (07/24 0900) Resp:  [18-31] 22 (07/24 0900) BP: (119-143)/(53-74) 142/64 (07/24 0900) SpO2:  [96 %-100 %] 100 % (07/24 0900) Weight:  [124.3 kg] 124.3 kg (07/24 0300) Last BM Date : 06/07/22  Intake/Output from previous day: 07/23 0701 - 07/24 0700 In: 1250 [P.O.:800; IV Piggyback:450] Out: 0102 [Urine:1150; Emesis/NG output:2025] Intake/Output this shift: No intake/output data recorded.  General appearance: alert, cooperative, and no distress GI: soft, non-tender; bowel sounds normal; no masses,  no organomegaly  Lab Results:  Recent Labs    06/07/22 0416 06/08/22 0500  WBC 9.6 11.1*  HGB 12.4* 11.9*  HCT 38.4* 37.3*  PLT 267 314   BMET Recent Labs    06/07/22 0416 06/08/22 0501  NA 142 142  K 3.4* 3.2*  CL 99 101  CO2 31 29  GLUCOSE 156* 131*  BUN 31* 26*  CREATININE 1.88* 1.56*  CALCIUM 7.7* 7.9*   PT/INR No results for input(s): "LABPROT", "INR" in the last 72 hours.  Studies/Results: DG Abd Portable 1V-Small Bowel Obstruction Protocol-initial, 8 hr delay  Result Date: 06/07/2022 CLINICAL DATA:  Small-bowel obstruction, 8 hour delay EXAM: PORTABLE ABDOMEN - 1 VIEW COMPARISON:  06/07/2022, 8:17 a.m. FINDINGS: Nonobstructive pattern of bowel gas. The colon is opacified in its entirety by enteric contrast. Esophagogastric tube with tip and side port below the diaphragm. IMPRESSION: Nonobstructive pattern of bowel gas. The colon is opacified in its entirety by enteric contrast. Electronically Signed   By: Delanna Ahmadi M.D.   On: 06/07/2022 19:11   DG Abd Portable 1V-Small Bowel Protocol-Position Verification  Result Date: 06/07/2022 CLINICAL DATA:  Nasogastric tube  placement. EXAM: PORTABLE ABDOMEN - 1 VIEW COMPARISON:  06/06/2022 FINDINGS: Nasogastric tube is looped once over the gastric fundus and has tip over the right upper quadrant likely over the distal stomach or proximal duodenum as this is unchanged. Remainder of the exam is unchanged. IMPRESSION: Nasogastric tube with tip over the right upper quadrant likely over the distal stomach or proximal duodenum unchanged. Electronically Signed   By: Marin Olp M.D.   On: 06/07/2022 08:32    Anti-infectives: Anti-infectives (From admission, onward)    None       Assessment/Plan: Impression: Partial small bowel obstruction/ileus, resolved.  No acute surgical intervention warranted. Plan: Will advance to full liquid diet.  Continue cathartics for now.  LOS: 5 days    Aviva Signs 06/08/2022

## 2022-06-08 NOTE — Plan of Care (Signed)
  Problem: Acute Rehab PT Goals(only PT should resolve) Goal: Pt Will Transfer Bed To Chair/Chair To Bed Outcome: Progressing Flowsheets (Taken 06/08/2022 1442) Pt will Transfer Bed to Chair/Chair to Bed: with modified independence Goal: Pt Will Ambulate Outcome: Progressing Flowsheets (Taken 06/08/2022 1442) Pt will Ambulate:  > 125 feet  with modified independence Goal: Pt/caregiver will Perform Home Exercise Program Outcome: Progressing Flowsheets (Taken 06/08/2022 1442) Pt/caregiver will Perform Home Exercise Program:  For increased strengthening  For improved balance  Independently  2:42 PM, 06/08/22 Mearl Latin PT, DPT Physical Therapist at [facility]

## 2022-06-08 NOTE — Assessment & Plan Note (Signed)
-  Acute on chronic -PT/OT evaluation recommendation, fall precautions -Candidate for home health

## 2022-06-08 NOTE — Evaluation (Signed)
Physical Therapy Evaluation Patient Details Name: Colton Jackson. MRN: [id number] DOB: July 13, 1951 Today's Date: 06/08/2022  History of Present Illness  Blaze Nylund. is a 71 y.o. male with medical history significant of neck artery aneurysm, hypertension, GERD, OSA, thyroid nodule, diabetes mellitus type 2, and more presents the ED with a chief complaint of abdominal distention and nausea vomiting.  Patient reports that his abdomen started getting distended the day prior to presentation.  On the day of presentation, he reports vomiting that first look like undigested food and then lifted and felt like feces.  Patient reports no blood in his emesis, but the NG tube does have blood in the output.  Patient reports that his last bowel movement was 1 week ago.  Approximately 6 weeks ago he had 5-6 days of diarrhea that spontaneously resolved.  Patient reports he has had abdominal pain for approximately 1 week that feels like fullness.  Reports has been progressively worse since it started.  He has never had symptoms like this before.  He does report chest pain that is associated with the abdomen.  He reports that it feels like indigestion coming up from his abdomen.  He has had dyspnea that is chronic for him.  He is dyspnea is worse with exertion.  He wears 4 L nasal cannula at home.  Patient denies any fevers, chills, or other symptoms.    Clinical Impression  Patient limited for functional mobility as stated below secondary to BLE weakness, fatigue and impaired activity tolerance. Patient seated in chair at beginning of session. He does not require assist to transfer to standing but does rely on momentum and use of RW. He ambulates on 6L O2 with O2 sat remaining in 90s. He does require several standing rest breaks for fatigue along with RW. Patient returned to chair at end of session. Patient will benefit from continued physical therapy in hospital and recommended venue below to increase strength, balance,  endurance for safe ADLs and gait.        Recommendations for follow up therapy are one component of a multi-disciplinary discharge planning process, led by the attending physician.  Recommendations may be updated based on patient status, additional functional criteria and insurance authorization.  Follow Up Recommendations Home health PT      Assistance Recommended at Discharge Intermittent Supervision/Assistance  Patient can return home with the following  Assistance with cooking/housework    Equipment Recommendations None recommended by PT  Recommendations for Other Services       Functional Status Assessment Patient has had a recent decline in their functional status and demonstrates the ability to make significant improvements in function in a reasonable and predictable amount of time.     Precautions / Restrictions Precautions Precautions: Fall Restrictions Weight Bearing Restrictions: No      Mobility  Bed Mobility               General bed mobility comments: seated in chair at beginning of session    Transfers Overall transfer level: Modified independent Equipment used: Rolling walker (2 wheels)               General transfer comment: uses momentum to power up to standing    Ambulation/Gait Ambulation/Gait assistance: Supervision, Modified independent (Device/Increase time) Gait Distance (Feet): 120 Feet Assistive device: Rolling walker (2 wheels) Gait Pattern/deviations: Step-through pattern, Trunk flexed Gait velocity: decreased     General Gait Details: labored cadence with use of RW, intermittent standing rest breaks for  fatigue  Stairs            Wheelchair Mobility    Modified Rankin (Stroke Patients Only)       Balance Overall balance assessment: Needs assistance Sitting-balance support: No upper extremity supported, Feet supported Sitting balance-Leahy Scale: Good     Standing balance support: Bilateral upper extremity  supported, Reliant on assistive device for balance Standing balance-Leahy Scale: Fair Standing balance comment: fair/good with RW                             Pertinent Vitals/Pain Pain Assessment Pain Assessment: No/denies pain    Home Living Family/patient expects to be discharged to:: Private residence Living Arrangements: Alone Available Help at Discharge: Friend(s);Available PRN/intermittently Type of Home: House Home Access: Level entry       Home Layout: One level Home Equipment: Shower seat;Cane - single point      Prior Function Prior Level of Function : Independent/Modified Independent             Mobility Comments: states household ambulation with rollator, takes breaks when needed ADLs Comments: independent with basic ADL, has groceries delivered     Hand Dominance        Extremity/Trunk Assessment   Upper Extremity Assessment Upper Extremity Assessment: Generalized weakness    Lower Extremity Assessment Lower Extremity Assessment: Generalized weakness    Cervical / Trunk Assessment Cervical / Trunk Assessment: Kyphotic  Communication   Communication: No difficulties  Cognition Arousal/Alertness: Awake/alert Behavior During Therapy: WFL for tasks assessed/performed Overall Cognitive Status: Within Functional Limits for tasks assessed                                          General Comments      Exercises     Assessment/Plan    PT Assessment Patient needs continued PT services  PT Problem List Decreased strength;Decreased mobility;Decreased activity tolerance;Decreased balance;Cardiopulmonary status limiting activity       PT Treatment Interventions DME instruction;Therapeutic exercise;Gait training;Balance training;Stair training;Neuromuscular re-education;Functional mobility training;Therapeutic activities;Patient/family education    PT Goals (Current goals can be found in the Care Plan section)  Acute  Rehab PT Goals Patient Stated Goal: return home PT Goal Formulation: With patient Time For Goal Achievement: 06/22/22 Potential to Achieve Goals: Good    Frequency Min 3X/week     Co-evaluation               AM-PAC PT "6 Clicks" Mobility  Outcome Measure                  End of Session Equipment Utilized During Treatment: Oxygen Activity Tolerance: Patient tolerated treatment well Patient left: in chair;with call bell/phone within reach Nurse Communication: Mobility status PT Visit Diagnosis: Unsteadiness on feet (R26.81);Other abnormalities of gait and mobility (R26.89);Muscle weakness (generalized) (M62.81)    Time: 1610-9604 PT Time Calculation (min) (ACUTE ONLY): 26 min   Charges:   PT Evaluation $PT Eval Low Complexity: 1 Low PT Treatments $Therapeutic Activity: 23-37 mins        2:38 PM, 06/08/22 Mearl Latin PT, DPT Physical Therapist at [facility]

## 2022-06-09 DIAGNOSIS — J9621 Acute and chronic respiratory failure with hypoxia: Secondary | ICD-10-CM | POA: Diagnosis not present

## 2022-06-09 DIAGNOSIS — I272 Pulmonary hypertension, unspecified: Secondary | ICD-10-CM | POA: Diagnosis present

## 2022-06-09 DIAGNOSIS — E876 Hypokalemia: Secondary | ICD-10-CM | POA: Diagnosis not present

## 2022-06-09 DIAGNOSIS — K56609 Unspecified intestinal obstruction, unspecified as to partial versus complete obstruction: Secondary | ICD-10-CM | POA: Diagnosis not present

## 2022-06-09 LAB — BASIC METABOLIC PANEL
Anion gap: 9 (ref 5–15)
BUN: 15 mg/dL (ref 8–23)
CO2: 32 mmol/L (ref 22–32)
Calcium: 8.2 mg/dL — ABNORMAL LOW (ref 8.9–10.3)
Chloride: 98 mmol/L (ref 98–111)
Creatinine, Ser: 1.23 mg/dL (ref 0.61–1.24)
GFR, Estimated: >60 mL/min (ref >60)
Glucose, Bld: 153 mg/dL — ABNORMAL HIGH (ref 70–99)
Potassium: 3 mmol/L — ABNORMAL LOW (ref 3.5–5.1)
Sodium: 139 mmol/L (ref 135–145)

## 2022-06-09 LAB — CBC
HCT: 34.4 % — ABNORMAL LOW (ref 39.0–52.0)
Hemoglobin: 11.2 g/dL — ABNORMAL LOW (ref 13.0–17.0)
MCH: 31.4 pg (ref 26.0–34.0)
MCHC: 32.6 g/dL (ref 30.0–36.0)
MCV: 96.4 fL (ref 80.0–100.0)
Platelets: 280 10*3/uL (ref 150–400)
RBC: 3.57 MIL/uL — ABNORMAL LOW (ref 4.22–5.81)
RDW: 12.8 % (ref 11.5–15.5)
WBC: 7.9 10*3/uL (ref 4.0–10.5)
nRBC: 0 % (ref 0.0–0.2)

## 2022-06-09 LAB — GLUCOSE, CAPILLARY
Glucose-Capillary: 155 mg/dL — ABNORMAL HIGH (ref 70–99)
Glucose-Capillary: 252 mg/dL — ABNORMAL HIGH (ref 70–99)
Glucose-Capillary: 217 mg/dL — ABNORMAL HIGH (ref 70–99)
Glucose-Capillary: 252 mg/dL — ABNORMAL HIGH (ref 70–99)

## 2022-06-09 MED ORDER — POTASSIUM CHLORIDE CRYS ER 20 MEQ PO TBCR
40.0000 meq | EXTENDED_RELEASE_TABLET | Freq: Once | ORAL | Status: AC
  Administered 2022-06-09: 40 meq via ORAL
  Filled 2022-06-09: qty 2

## 2022-06-09 MED ORDER — LOSARTAN POTASSIUM 50 MG PO TABS
25.0000 mg | ORAL_TABLET | Freq: Every day | ORAL | Status: DC
  Administered 2022-06-09: 25 mg via ORAL
  Filled 2022-06-09: qty 1

## 2022-06-09 MED ORDER — FUROSEMIDE 40 MG PO TABS
40.0000 mg | ORAL_TABLET | Freq: Every day | ORAL | Status: DC
  Administered 2022-06-09 – 2022-06-10 (×2): 40 mg via ORAL
  Filled 2022-06-09 (×2): qty 1

## 2022-06-09 NOTE — Assessment & Plan Note (Signed)
-  Monitoring and repleting orally -d/c home with KCl 10 daily

## 2022-06-09 NOTE — Progress Notes (Signed)
Patient has home CPAP in room.  Patient manages.

## 2022-06-09 NOTE — Progress Notes (Signed)
PROGRESS NOTE    Patient: Colton Jackson.                            PCP: Sharilyn Sites, MD                    DOB: November 06, 1951            DOA: 06/03/2022 [id number]:[id number]             DOS: 06/09/2022, 12:02 PM   LOS: 6 days   Date of Service: The patient was seen and examined on 06/09/2022  Subjective:   The patient was seen and examined this morning, stable, in no acute distress Requiring 6 L of oxygen, satting 93% Bowel movement x1 overnight Tolerating p.o.   Brief Narrative:   Colton Elsass. is a 71 y.o. male with medical history significant of neck artery aneurysm, hypertension, GERD, OSA, thyroid nodule, diabetes mellitus type 2, and more presents the ED with a chief complaint of abdominal distention and nausea vomiting.   Patient reports that his abdomen started getting distended the day prior to presentation.  On the day of presentation, he reports vomiting that first look like undigested food and then lifted and felt like feces.  Patient reports no blood in his emesis, but the NG tube does have blood in the output.  Patient reports that his last bowel movement was 1 week ago.  Approximately 6 weeks ago he had 5-6 days of diarrhea that spontaneously resolved.  Patient reports he has had abdominal pain for approximately 1 week that feels like fullness.  Reports has been progressively worse since it started.  He has never had symptoms like this before.  He does report chest pain that is associated with the abdomen.  He reports that it feels like indigestion coming up from his abdomen.  He has had dyspnea that is chronic for him.  He is dyspnea is worse with exertion.  He wears 4 L nasal cannula at home.  Patient denies any fevers, chills, or other symptoms.   Patient does not smoke, does not drink, does not use illicit drugs.  He is vaccinated for COVID.  Patient is full code.  ED:  Temp 98.1, heart rate 100-111, respiratory rate 10-40, blood pressure 107/55-174/109, satting  85-100% Leukocytosis 14.5, hemoglobin 13.1 Hypokalemia at 3.4 CT abdomen pelvis shows small bowel obstruction with right lower quadrant transition zone likely due to adhesions.  Patient does have a history of appendectomy as a child General surgery was consulted and recommends suppositories, NG tube, n.p.o. Admission was requested for small bowel obstruction management    Assessment & Plan:   Principal Problem:   SBO (small bowel obstruction) (HCC) Active Problems:   Acute on chronic respiratory failure with hypoxia (HCC)   Pulmonary hypertension (HCC)   Hypokalemia   Essential hypertension   Elevated serum creatinine   Diabetes mellitus type 2 in obese (HCC)   Leukocytosis   Debility     Assessment and Plan: * SBO (small bowel obstruction) ([place]) -Resolved spontaneously -Tolerating p.o., BM x1  -KUB small bowel follow-through revealing contrast throughout the colon -General surgery was following, signed off -NG tube discontinued  -Bowel movement x1 and gas in last 24 hours   -Discontinuing IV fluids..  -As needed analgesics -Hemoccult also positive, likely due to SBO, melena or bleeding was noted, H&H stable Follow-up as an outpatient with a gastroenterologist  Pulmonary hypertension ([place]) -  Per cardiology acute on chronic pulmonary hypertension --Cardiology concern for severe pulmonary hypertension -P.o. Lasix initiated today 06/09/2022, Once stable patient is to follow-up with Dr. Aundra Dubin in HF clinic for pulmonary hypertension  Acute on chronic respiratory failure with hypoxia ([place]) -Back to baseline, satting 93% on 6 L of oxygen  -On this admission patient was as high as 11 L of oxygen, -Improved tachycardia, tachypnea -Hypoxic off supplemental oxygen   -IV fluids discontinued, dose of Lasix was given x2 06/05/2022 Chest x-ray obtained: Revealing;  cardiomegaly and vascular congestion. 2. Streaky density in the bilateral lungs which could be  atelectasis or infection :  -Cardiology concern as patient's pulmonary hypertension is contributing to acute on chronic respiratory failure.  Hypokalemia - Monitoring and repleting orally -KCl 3.2, 3.0 -40 mEq KCl today, checking magnesium  Debility - Acute on chronic -PT/OT evaluation recommendation, fall precautions -Candidate for home health  Leukocytosis - White blood cell count 14.5 >> > 11.1, 7.9 -Technically meets SIRS criteria, but likely from pain and not from infection, afebrile, -Leukocytosis is thought to be secondary to SBO -No antibiotics at this time   Diabetes mellitus type 2 in obese (HCC) - Hold metformin and glipizide CBG (last 3)  Recent Labs    06/08/22 2157 06/09/22 0709 06/09/22 1108  GLUCAP 190* 155* 252*    -Tolerating p.o., switching CBG to q. ACHS with SSI coverage -Starting clear liquid diet, titrating insulin accordingly -Continue to monitor -Adding long-acting insulin-increasing dose accordingly  Elevated serum creatinine - Likely at baseline, he was trending up -Discontinue IV fluids Lab Results  Component Value Date   CREATININE 1.23 06/09/2022   CREATININE 1.56 (H) 06/08/2022   CREATININE 1.88 (H) 06/07/2022    -Trending creatinine  -Avoiding nephrotoxins    Essential hypertension - Restarting: Home medication of losartan, Toprol-XL, -Continue to hold amlodipine and clonidine  (likely be DC'd on discharge  -Continue to monitor -BP stabilizing -Stable, as needed hydralazine    ----------------------------------------------------------------------------------------------------------------------------------------------- Obesity - nutritional status:  The patient's BMI is: Body mass index is 42.92 kg/m. I agree with the assessment and plan as outlined  Currently n.p.o. --------------------------------------------------------------------------------------------------------------------------------------------  DVT  prophylaxis:  heparin injection 5,000 Units Start: 06/04/22 0600 SCDs Start: 06/04/22 0406   Code Status:   Code Status: Full Code  Family Communication: No family member present at bedside- attempt will be made to update daily The above findings and plan of care has been discussed with patient (and family)  in detail,  they expressed understanding and agreement of above. -Advance care planning has been discussed.   Admission status:   Status is: Inpatient Remains inpatient appropriate because: Needing surgical management including NG tube, IV fluids, surgery evaluation for possible intervention  Disposition: From home likely to be discharged home in 24 hours If he remains stable-cardiology recommended 1 more night of evaluation and observation closely (Giving starting losartan Lasix, monitoring kidney function)   Procedures:   No admission procedures for hospital encounter.   Antimicrobials:  Anti-infectives (From admission, onward)    None        Medication:   bisacodyl  10 mg Rectal Once   bisacodyl  10 mg Rectal BID   Chlorhexidine Gluconate Cloth  6 each Topical Daily   cromolyn  1 spray Each Nare TID   famotidine  20 mg Oral BID   furosemide  40 mg Oral Daily   heparin  5,000 Units Subcutaneous Q8H   insulin aspart  0-15 Units Subcutaneous TID WC   insulin  aspart  0-5 Units Subcutaneous QHS   insulin detemir  10 Units Subcutaneous BID   losartan  25 mg Oral Daily   metoprolol succinate  50 mg Oral Daily   sennosides  10 mL Oral BID    acetaminophen **OR** acetaminophen, hydrALAZINE, levalbuterol, menthol-cetylpyridinium, metoprolol tartrate, morphine injection, ondansetron **OR** ondansetron (ZOFRAN) IV, oxyCODONE, phenol   Objective:   Vitals:   06/08/22 1103 06/08/22 1500 06/08/22 1644 06/09/22 0458  BP:   131/70 (!) 145/66  Pulse:  89 88 86  Resp:   20 (!) 21  Temp: 98.8 F (37.1 C)  98.5 F (36.9 C) 98 F (36.7 C)  TempSrc: Oral  Oral Oral   SpO2:  95% 99% 93%  Weight:      Height:        Intake/Output Summary (Last 24 hours) at 06/09/2022 1202 Last data filed at 06/09/2022 0930 Gross per 24 hour  Intake 1440 ml  Output --  Net 1440 ml   Filed Weights   06/04/22 0614 06/07/22 0400 06/08/22 0300  Weight: 128.7 kg 126.3 kg 124.3 kg     Examination:      Physical Exam:   General:  AAO x 3,  cooperative, no distress;   HEENT:  Normocephalic, PERRL, otherwise with in Normal limits   Neuro:  CNII-XII intact. , normal motor and sensation, reflexes intact   Lungs:   Clear to auscultation BL, Respirations unlabored,  No wheezes / crackles  Cardio:    S1/S2, RRR, No murmure, No Rubs or Gallops   Abdomen:  Soft, non-tender, bowel sounds active all four quadrants, no guarding or peritoneal signs.  Muscular  skeletal:  Limited exam -global generalized weaknesses - in bed, able to move all 4 extremities,   2+ pulses,  symmetric, +2 pitting edema  Skin:  Dry, warm to touch, negative for any Rashes,  Wounds: Please see nursing documentation          ------------------------------------------------------------------------------------------------------------------------------------------    LABs:     Latest Ref Rng & Units 06/09/2022    5:19 AM 06/08/2022    5:00 AM 06/07/2022    4:16 AM  CBC  WBC 4.0 - 10.5 K/uL 7.9  11.1  9.6   Hemoglobin 13.0 - 17.0 g/dL 11.2  11.9  12.4   Hematocrit 39.0 - 52.0 % 34.4  37.3  38.4   Platelets 150 - 400 K/uL 280  314  267       Latest Ref Rng & Units 06/09/2022    5:19 AM 06/08/2022    5:01 AM 06/07/2022    4:16 AM  CMP  Glucose 70 - 99 mg/dL 153  131  156   BUN 8 - 23 mg/dL _0 Creatinine 0.61 - 1.24 mg/dL 1.23  1.56  1.88   Sodium 135 - 145 mmol/L 139  142  142   Potassium 3.5 - 5.1 mmol/L 3.0  3.2  3.4   Chloride 98 - 111 mmol/L 98  101  99   CO2 22 - 32 mmol/L 32  29  31   Calcium 8.9 - 10.3 mg/dL 8.2  7.9  7.7   Total Protein 6.5 - 8.1 g/dL  7.0  7.0    Total Bilirubin 0.3 - 1.2 mg/dL  1.4  1.2   Alkaline Phos 38 - 126 U/L  57  54   AST 15 - 41 U/L  23  14   ALT 0 - 44 U/L  16  11        Micro Results Recent Results (from the past 240 hour(s))  MRSA Next Gen by PCR, Nasal     Status: None   Collection Time: 06/05/22  3:15 PM   Specimen: Nasal Mucosa; Nasal Swab  Result Value Ref Range Status   MRSA by PCR Next Gen NOT DETECTED NOT DETECTED Final    Comment: (NOTE) The GeneXpert MRSA Assay (FDA approved for NASAL specimens only), is one component of a comprehensive MRSA colonization surveillance program. It is not intended to diagnose MRSA infection nor to guide or monitor treatment for MRSA infections. Test performance is not FDA approved in patients less than 47 years old. Performed at [facility], [address] [place], [place] [address]     Radiology Reports ECHOCARDIOGRAM COMPLETE  Result Date: 06/08/2022    ECHOCARDIOGRAM REPORT   Patient Name:   Colton Mccluney. Date of Exam: 06/08/2022 Medical Rec #:  [id number]        Height:       67.0 in Accession #:    [id number]       Weight:       274.0 lb Date of Birth:  11-14-1951         BSA:          2.312 m Patient Age:    49 years         BP:           136/59 mmHg Patient Gender: M                HR:           91 bpm. Exam Location:  [facility] Procedure: 2D Echo, Cardiac Doppler and Color Doppler Indications:    R06.00 Dyspnea  History:        Patient has prior history of Echocardiogram examinations, most                 recent 01/24/2018. Risk Factors:Hypertension and Diabetes.                 Obstructive sleep apnea. MORBID OBESITY.  Sonographer:    Alvino Chapel RCS Referring Phys: [place]  1. Left ventricular ejection fraction, by estimation, is 70 to 75%. The left ventricle has hyperdynamic function. The left ventricle has no regional wall motion abnormalities. There is moderate left ventricular hypertrophy. Left ventricular diastolic parameters are  consistent with Grade I diastolic dysfunction (impaired relaxation). There is the interventricular septum is flattened in systole and diastole, consistent with right ventricular pressure and volume overload.  2. Right ventricular systolic function is normal. The right ventricular size is mildly enlarged. There is severely elevated pulmonary artery systolic pressure. The estimated right ventricular systolic pressure is 29.7 mmHg.  3. Right atrial size was mildly dilated.  4. The mitral valve is grossly normal. Trivial mitral valve regurgitation.  5. The aortic valve is tricuspid. Aortic valve regurgitation is not visualized.  6. The inferior vena cava is dilated in size with >50% respiratory variability, suggesting right atrial pressure of 8 mmHg. Comparison(s): Prior images unable to be directly viewed. FINDINGS  Left Ventricle: Left ventricular ejection fraction, by estimation, is 70 to 75%. The left ventricle has hyperdynamic function. The left ventricle has no regional wall motion abnormalities. The left ventricular internal cavity size was normal in size. There is moderate left ventricular hypertrophy. The interventricular septum is flattened in systole and diastole, consistent with right ventricular pressure and volume  overload. Left ventricular diastolic parameters are consistent with Grade I diastolic dysfunction (impaired relaxation). Right Ventricle: The right ventricular size is mildly enlarged. No increase in right ventricular wall thickness. Right ventricular systolic function is normal. There is severely elevated pulmonary artery systolic pressure. The tricuspid regurgitant velocity is 4.16 m/s, and with an assumed right atrial pressure of 8 mmHg, the estimated right ventricular systolic pressure is 63.8 mmHg. Left Atrium: Left atrial size was normal in size. Right Atrium: Right atrial size was mildly dilated. Pericardium: There is no evidence of pericardial effusion. Mitral Valve: The mitral valve is  grossly normal. Trivial mitral valve regurgitation. Tricuspid Valve: The tricuspid valve is grossly normal. Tricuspid valve regurgitation is mild. Aortic Valve: The aortic valve is tricuspid. There is mild aortic valve annular calcification. Aortic valve regurgitation is not visualized. Pulmonic Valve: The pulmonic valve was grossly normal. Pulmonic valve regurgitation is trivial. Aorta: The aortic root is normal in size and structure. Venous: The inferior vena cava is dilated in size with greater than 50% respiratory variability, suggesting right atrial pressure of 8 mmHg. IAS/Shunts: There is left bowing of the interatrial septum, suggestive of elevated right atrial pressure. No atrial level shunt detected by color flow Doppler.  LEFT VENTRICLE PLAX 2D LVIDd:         4.00 cm   Diastology LVIDs:         2.10 cm   LV e' medial:    9.46 cm/s LV PW:         1.30 cm   LV E/e' medial:  7.2 LV IVS:        1.50 cm   LV e' lateral:   12.00 cm/s LVOT diam:     2.10 cm   LV E/e' lateral: 5.7 LV SV:         81 LV SV Index:   35 LVOT Area:     3.46 cm  RIGHT VENTRICLE RV S prime:     20.80 cm/s TAPSE (M-mode): 2.8 cm LEFT ATRIUM             Index        RIGHT ATRIUM           Index LA diam:        3.20 cm 1.38 cm/m   RA Area:     23.50 cm LA Vol (A2C):   73.1 ml 31.62 ml/m  RA Volume:   81.20 ml  35.12 ml/m LA Vol (A4C):   42.5 ml 18.38 ml/m LA Biplane Vol: 56.2 ml 24.31 ml/m  AORTIC VALVE LVOT Vmax:   137.00 cm/s LVOT Vmean:  82.400 cm/s LVOT VTI:    0.234 m  AORTA Ao Root diam: 3.50 cm MITRAL VALVE               TRICUSPID VALVE MV Area (PHT): 3.60 cm    TR Peak grad:   69.2 mmHg MV Decel Time: 211 msec    TR Vmax:        416.00 cm/s MV E velocity: 67.90 cm/s MV A velocity: 90.80 cm/s  SHUNTS MV E/A ratio:  0.75        Systemic VTI:  0.23 m                            Systemic Diam: 2.10 cm Rozann Lesches MD Electronically signed by Rozann Lesches MD Signature Date/Time: 06/08/2022/3:17:29 PM    Final     SIGNED:  Deatra Dejour, MD, FHM. [facility],  Pager (please use [email or web address] to page/text) Please use Epic Secure Chat for non-urgent communication (7AM-7PM)  If 7PM-7AM, please contact night-coverage [email or web address], 06/09/2022, 12:02 PM

## 2022-06-09 NOTE — Progress Notes (Signed)
4 Days Post-Op  Subjective: Patient denies any nausea or vomiting.  Is having bowel movements.  No abdominal pain is noted.  Is tolerating full liquid diet well.  Objective: Vital signs in last 24 hours: Temp:  [98 F (36.7 C)-98.5 F (36.9 C)] 98 F (36.7 C) (07/25 0458) Pulse Rate:  [86-89] 86 (07/25 0458) Resp:  [20-21] 21 (07/25 0458) BP: (131-145)/(66-70) 145/66 (07/25 0458) SpO2:  [93 %-99 %] 93 % (07/25 0458) Last BM Date : 06/08/22  Intake/Output from previous day: 07/24 0701 - 07/25 0700 In: 960 [P.O.:960] Out: -  Intake/Output this shift: Total I/O In: 480 [P.O.:480] Out: -   General appearance: alert, cooperative, and no distress GI: soft, non-tender; bowel sounds normal; no masses,  no organomegaly  Lab Results:  Recent Labs    06/08/22 0500 06/09/22 0519  WBC 11.1* 7.9  HGB 11.9* 11.2*  HCT 37.3* 34.4*  PLT 314 280   BMET Recent Labs    06/08/22 0501 06/09/22 0519  NA 142 139  K 3.2* 3.0*  CL 101 98  CO2 29 32  GLUCOSE 131* 153*  BUN 26* 15  CREATININE 1.56* 1.23  CALCIUM 7.9* 8.2*   PT/INR No results for input(s): "LABPROT", "INR" in the last 72 hours.  Studies/Results: ECHOCARDIOGRAM COMPLETE  Result Date: 06/08/2022    ECHOCARDIOGRAM REPORT   Patient Name:   Colton Jackson. Date of Exam: 06/08/2022 Medical Rec #:  [id number]        Height:       67.0 in Accession #:    [id number]       Weight:       274.0 lb Date of Birth:  11-08-1951         BSA:          2.312 m Patient Age:    71 years         BP:           136/59 mmHg Patient Gender: M                HR:           91 bpm. Exam Location:  [facility] Procedure: 2D Echo, Cardiac Doppler and Color Doppler Indications:    R06.00 Dyspnea  History:        Patient has prior history of Echocardiogram examinations, most                 recent 01/24/2018. Risk Factors:Hypertension and Diabetes.                 Obstructive sleep apnea. MORBID OBESITY.  Sonographer:    Alvino Chapel RCS Referring Phys:  [place]  1. Left ventricular ejection fraction, by estimation, is 70 to 75%. The left ventricle has hyperdynamic function. The left ventricle has no regional wall motion abnormalities. There is moderate left ventricular hypertrophy. Left ventricular diastolic parameters are consistent with Grade I diastolic dysfunction (impaired relaxation). There is the interventricular septum is flattened in systole and diastole, consistent with right ventricular pressure and volume overload.  2. Right ventricular systolic function is normal. The right ventricular size is mildly enlarged. There is severely elevated pulmonary artery systolic pressure. The estimated right ventricular systolic pressure is 71.6 mmHg.  3. Right atrial size was mildly dilated.  4. The mitral valve is grossly normal. Trivial mitral valve regurgitation.  5. The aortic valve is tricuspid. Aortic valve regurgitation is not visualized.  6. The inferior vena cava  is dilated in size with >50% respiratory variability, suggesting right atrial pressure of 8 mmHg. Comparison(s): Prior images unable to be directly viewed. FINDINGS  Left Ventricle: Left ventricular ejection fraction, by estimation, is 70 to 75%. The left ventricle has hyperdynamic function. The left ventricle has no regional wall motion abnormalities. The left ventricular internal cavity size was normal in size. There is moderate left ventricular hypertrophy. The interventricular septum is flattened in systole and diastole, consistent with right ventricular pressure and volume overload. Left ventricular diastolic parameters are consistent with Grade I diastolic dysfunction (impaired relaxation). Right Ventricle: The right ventricular size is mildly enlarged. No increase in right ventricular wall thickness. Right ventricular systolic function is normal. There is severely elevated pulmonary artery systolic pressure. The tricuspid regurgitant velocity is 4.16 m/s, and with an  assumed right atrial pressure of 8 mmHg, the estimated right ventricular systolic pressure is 51.7 mmHg. Left Atrium: Left atrial size was normal in size. Right Atrium: Right atrial size was mildly dilated. Pericardium: There is no evidence of pericardial effusion. Mitral Valve: The mitral valve is grossly normal. Trivial mitral valve regurgitation. Tricuspid Valve: The tricuspid valve is grossly normal. Tricuspid valve regurgitation is mild. Aortic Valve: The aortic valve is tricuspid. There is mild aortic valve annular calcification. Aortic valve regurgitation is not visualized. Pulmonic Valve: The pulmonic valve was grossly normal. Pulmonic valve regurgitation is trivial. Aorta: The aortic root is normal in size and structure. Venous: The inferior vena cava is dilated in size with greater than 50% respiratory variability, suggesting right atrial pressure of 8 mmHg. IAS/Shunts: There is left bowing of the interatrial septum, suggestive of elevated right atrial pressure. No atrial level shunt detected by color flow Doppler.  LEFT VENTRICLE PLAX 2D LVIDd:         4.00 cm   Diastology LVIDs:         2.10 cm   LV e' medial:    9.46 cm/s LV PW:         1.30 cm   LV E/e' medial:  7.2 LV IVS:        1.50 cm   LV e' lateral:   12.00 cm/s LVOT diam:     2.10 cm   LV E/e' lateral: 5.7 LV SV:         81 LV SV Index:   35 LVOT Area:     3.46 cm  RIGHT VENTRICLE RV S prime:     20.80 cm/s TAPSE (M-mode): 2.8 cm LEFT ATRIUM             Index        RIGHT ATRIUM           Index LA diam:        3.20 cm 1.38 cm/m   RA Area:     23.50 cm LA Vol (A2C):   73.1 ml 31.62 ml/m  RA Volume:   81.20 ml  35.12 ml/m LA Vol (A4C):   42.5 ml 18.38 ml/m LA Biplane Vol: 56.2 ml 24.31 ml/m  AORTIC VALVE LVOT Vmax:   137.00 cm/s LVOT Vmean:  82.400 cm/s LVOT VTI:    0.234 m  AORTA Ao Root diam: 3.50 cm MITRAL VALVE               TRICUSPID VALVE MV Area (PHT): 3.60 cm    TR Peak grad:   69.2 mmHg MV Decel Time: 211 msec    TR Vmax:  416.00 cm/s MV E velocity: 67.90 cm/s MV A velocity: 90.80 cm/s  SHUNTS MV E/A ratio:  0.75        Systemic VTI:  0.23 m                            Systemic Diam: 2.10 cm Rozann Lesches MD Electronically signed by Rozann Lesches MD Signature Date/Time: 06/08/2022/3:17:29 PM    Final    DG Abd Portable 1V-Small Bowel Obstruction Protocol-24 hr delay  Result Date: 06/08/2022 CLINICAL DATA:  Small bowel obstruction EXAM: PORTABLE ABDOMEN - 1 VIEW COMPARISON:  06/07/2022 FINDINGS: Oral contrast material opacifies the distal small bowel and colon to the level of the rectum. No bowel dilatation to suggest obstruction. No evidence of pneumoperitoneum, portal venous gas or pneumatosis. No pathologic calcifications along the expected course of the ureters. No acute osseous abnormality. IMPRESSION: 1. No evidence of bowel obstruction. Electronically Signed   By: Kathreen Devoid M.D.   On: 06/08/2022 10:54   DG Abd Portable 1V-Small Bowel Obstruction Protocol-initial, 8 hr delay  Result Date: 06/07/2022 CLINICAL DATA:  Small-bowel obstruction, 8 hour delay EXAM: PORTABLE ABDOMEN - 1 VIEW COMPARISON:  06/07/2022, 8:17 a.m. FINDINGS: Nonobstructive pattern of bowel gas. The colon is opacified in its entirety by enteric contrast. Esophagogastric tube with tip and side port below the diaphragm. IMPRESSION: Nonobstructive pattern of bowel gas. The colon is opacified in its entirety by enteric contrast. Electronically Signed   By: Delanna Ahmadi M.D.   On: 06/07/2022 19:11    Anti-infectives: Anti-infectives (From admission, onward)    None       Assessment/Plan: Impression: Partial small bowel obstruction, resolved.  Patient tolerating full liquid diet well.  No need for acute surgical intervention at this time. Plan: Advance to soft diet.  LOS: 6 days    Aviva Signs 06/09/2022

## 2022-06-09 NOTE — Progress Notes (Signed)
Pt refused to wear his CPAP machine

## 2022-06-09 NOTE — Progress Notes (Signed)
Progress Note  Patient Name: Colton Jackson. Date of Encounter: 06/09/2022  [facility] Cardiologist: Colton Lesches, MD   Subjective   Breathing is improving - echo yesterday showed severe pulmonary hypertension - RVSP 77 mmHg, LVEF 70-75%, normal RV systolic function, RA pressure of 8 mmHg. Recorded net negative 11L since admit. Weight 124 kg yesterday. Eating well - +BM, no need for acute surgery. Oxygen weaned back to 5L - was on 5-6L at home.  Inpatient Medications    Scheduled Meds:  bisacodyl  10 mg Rectal Once   bisacodyl  10 mg Rectal BID   Chlorhexidine Gluconate Cloth  6 each Topical Daily   cromolyn  1 spray Each Nare TID   famotidine  20 mg Oral BID   heparin  5,000 Units Subcutaneous Q8H   insulin aspart  0-15 Units Subcutaneous TID WC   insulin aspart  0-5 Units Subcutaneous QHS   insulin detemir  10 Units Subcutaneous BID   metoprolol succinate  50 mg Oral Daily   sennosides  10 mL Oral BID   Continuous Infusions:   PRN Meds: acetaminophen **OR** acetaminophen, hydrALAZINE, levalbuterol, menthol-cetylpyridinium, metoprolol tartrate, morphine injection, ondansetron **OR** ondansetron (ZOFRAN) IV, oxyCODONE, phenol   Vital Signs    Vitals:   06/08/22 1103 06/08/22 1500 06/08/22 1644 06/09/22 0458  BP:   131/70 (!) 145/66  Pulse:  89 88 86  Resp:   20 (!) 21  Temp: 98.8 F (37.1 C)  98.5 F (36.9 C) 98 F (36.7 C)  TempSrc: Oral  Oral Oral  SpO2:  95% 99% 93%  Weight:      Height:        Intake/Output Summary (Last 24 hours) at 06/09/2022 0913 Last data filed at 06/08/2022 1700 Gross per 24 hour  Intake 960 ml  Output --  Net 960 ml      06/08/2022    3:00 AM 06/07/2022    4:00 AM 06/04/2022    6:14 AM  Last 3 Weights  Weight (lbs) 274 lb 0.5 oz 278 lb 7.1 oz 283 lb 11.7 oz  Weight (kg) 124.3 kg 126.3 kg 128.7 kg      Telemetry    Sinus rhythm - personally reviewed  ECG   N/A  Physical Exam   General appearance: alert, no  distress, and morbidly obese Neck: no carotid bruit, no JVD, and thyroid not enlarged, symmetric, no tenderness/mass/nodules Lungs: clear to auscultation bilaterally Heart: regular rate and rhythm Abdomen: soft, non-tender; bowel sounds normal; no masses,  no organomegaly and obese Extremities: extremities normal, atraumatic, no cyanosis or edema and stockings in place Pulses: 2+ and symmetric Skin: Skin color, texture, turgor normal. No rashes or lesions Neurologic: Grossly normal Psych: Pleasant   Labs    High Sensitivity Troponin:   Recent Labs  Lab 06/06/22 0205  TROPONINIHS 61*     Chemistry Recent Labs  Lab 06/04/22 0432 06/05/22 0647 06/06/22 0205 06/07/22 0416 06/08/22 0501 06/09/22 0519  NA 141   < > 144 142 142 139  K 3.6   < > 3.0* 3.4* 3.2* 3.0*  CL 95*   < > 100 99 101 98  CO2 32   < > 32 31 29 32  GLUCOSE 191*   < > 239* 156* 131* 153*  BUN 19   < > 23 31* 26* 15  CREATININE 1.23   < > 1.53* 1.88* 1.56* 1.23  CALCIUM 8.4*   < > 7.5* 7.7* 7.9* 8.2*  MG 1.4*  --   --   --   --   --  PROT 7.6   < > 7.7 7.0 7.0  --   ALBUMIN 3.8   < > 3.5 3.2* 3.2*  --   AST 24   < > 19 14* 23  --   ALT 15   < > _0 --   ALKPHOS 50   < > 59 54 57  --   BILITOT 0.6   < > 1.3* 1.2 1.4*  --   GFRNONAA >60   < > 49* 38* 48* >60  ANIONGAP 14   < > _1 < > = values in this interval not displayed.     Hematology Recent Labs  Lab 06/07/22 0416 06/08/22 0500 06/09/22 0519  WBC 9.6 11.1* 7.9  RBC 3.91* 3.82* 3.57*  HGB 12.4* 11.9* 11.2*  HCT 38.4* 37.3* 34.4*  MCV 98.2 97.6 96.4  MCH 31.7 31.2 31.4  MCHC 32.3 31.9 32.6  RDW 13.3 13.2 12.8  PLT 267 314 280   Thyroid  Recent Labs  Lab 06/04/22 0432  TSH 0.855    BNP Recent Labs  Lab 06/05/22 0647 06/06/22 0727  BNP 291.0* 177.0*     Radiology    ECHOCARDIOGRAM COMPLETE  Result Date: 06/08/2022    ECHOCARDIOGRAM REPORT   Patient Name:   Colton Jackson. Date of Exam: 06/08/2022 Medical Rec  #:  [id number]        Height:       67.0 in Accession #:    [id number]       Weight:       274.0 lb Date of Birth:  05/11/1951         BSA:          2.312 m Patient Age:    71 years         BP:           136/59 mmHg Patient Gender: M                HR:           91 bpm. Exam Location:  [facility] Procedure: 2D Echo, Cardiac Doppler and Color Doppler Indications:    R06.00 Dyspnea  History:        Patient has prior history of Echocardiogram examinations, most                 recent 01/24/2018. Risk Factors:Hypertension and Diabetes.                 Obstructive sleep apnea. MORBID OBESITY.  Sonographer:    Alvino Chapel RCS Referring Phys: [place]  1. Left ventricular ejection fraction, by estimation, is 70 to 75%. The left ventricle has hyperdynamic function. The left ventricle has no regional wall motion abnormalities. There is moderate left ventricular hypertrophy. Left ventricular diastolic parameters are consistent with Grade I diastolic dysfunction (impaired relaxation). There is the interventricular septum is flattened in systole and diastole, consistent with right ventricular pressure and volume overload.  2. Right ventricular systolic function is normal. The right ventricular size is mildly enlarged. There is severely elevated pulmonary artery systolic pressure. The estimated right ventricular systolic pressure is 90.2 mmHg.  3. Right atrial size was mildly dilated.  4. The mitral valve is grossly normal. Trivial mitral valve regurgitation.  5. The aortic valve is tricuspid. Aortic valve regurgitation is not visualized.  6. The inferior vena cava is dilated in size with >50% respiratory variability, suggesting right atrial pressure  of 8 mmHg. Comparison(s): Prior images unable to be directly viewed. FINDINGS  Left Ventricle: Left ventricular ejection fraction, by estimation, is 70 to 75%. The left ventricle has hyperdynamic function. The left ventricle has no regional wall motion  abnormalities. The left ventricular internal cavity size was normal in size. There is moderate left ventricular hypertrophy. The interventricular septum is flattened in systole and diastole, consistent with right ventricular pressure and volume overload. Left ventricular diastolic parameters are consistent with Grade I diastolic dysfunction (impaired relaxation). Right Ventricle: The right ventricular size is mildly enlarged. No increase in right ventricular wall thickness. Right ventricular systolic function is normal. There is severely elevated pulmonary artery systolic pressure. The tricuspid regurgitant velocity is 4.16 m/s, and with an assumed right atrial pressure of 8 mmHg, the estimated right ventricular systolic pressure is 77.4 mmHg. Left Atrium: Left atrial size was normal in size. Right Atrium: Right atrial size was mildly dilated. Pericardium: There is no evidence of pericardial effusion. Mitral Valve: The mitral valve is grossly normal. Trivial mitral valve regurgitation. Tricuspid Valve: The tricuspid valve is grossly normal. Tricuspid valve regurgitation is mild. Aortic Valve: The aortic valve is tricuspid. There is mild aortic valve annular calcification. Aortic valve regurgitation is not visualized. Pulmonic Valve: The pulmonic valve was grossly normal. Pulmonic valve regurgitation is trivial. Aorta: The aortic root is normal in size and structure. Venous: The inferior vena cava is dilated in size with greater than 50% respiratory variability, suggesting right atrial pressure of 8 mmHg. IAS/Shunts: There is left bowing of the interatrial septum, suggestive of elevated right atrial pressure. No atrial level shunt detected by color flow Doppler.  LEFT VENTRICLE PLAX 2D LVIDd:         4.00 cm   Diastology LVIDs:         2.10 cm   LV e' medial:    9.46 cm/s LV PW:         1.30 cm   LV E/e' medial:  7.2 LV IVS:        1.50 cm   LV e' lateral:   12.00 cm/s LVOT diam:     2.10 cm   LV E/e' lateral: 5.7 LV  SV:         81 LV SV Index:   35 LVOT Area:     3.46 cm  RIGHT VENTRICLE RV S prime:     20.80 cm/s TAPSE (M-mode): 2.8 cm LEFT ATRIUM             Index        RIGHT ATRIUM           Index LA diam:        3.20 cm 1.38 cm/m   RA Area:     23.50 cm LA Vol (A2C):   73.1 ml 31.62 ml/m  RA Volume:   81.20 ml  35.12 ml/m LA Vol (A4C):   42.5 ml 18.38 ml/m LA Biplane Vol: 56.2 ml 24.31 ml/m  AORTIC VALVE LVOT Vmax:   137.00 cm/s LVOT Vmean:  82.400 cm/s LVOT VTI:    0.234 m  AORTA Ao Root diam: 3.50 cm MITRAL VALVE               TRICUSPID VALVE MV Area (PHT): 3.60 cm    TR Peak grad:   69.2 mmHg MV Decel Time: 211 msec    TR Vmax:        416.00 cm/s MV E velocity: 67.90 cm/s MV A velocity: 90.80 cm/s  SHUNTS MV E/A ratio:  0.75        Systemic VTI:  0.23 m                            Systemic Diam: 2.10 cm Colton Lesches MD Electronically signed by Colton Lesches MD Signature Date/Time: 06/08/2022/3:17:29 PM    Final    DG Abd Portable 1V-Small Bowel Obstruction Protocol-24 hr delay  Result Date: 06/08/2022 CLINICAL DATA:  Small bowel obstruction EXAM: PORTABLE ABDOMEN - 1 VIEW COMPARISON:  06/07/2022 FINDINGS: Oral contrast material opacifies the distal small bowel and colon to the level of the rectum. No bowel dilatation to suggest obstruction. No evidence of pneumoperitoneum, portal venous gas or pneumatosis. No pathologic calcifications along the expected course of the ureters. No acute osseous abnormality. IMPRESSION: 1. No evidence of bowel obstruction. Electronically Signed   By: Kathreen Devoid M.D.   On: 06/08/2022 10:54   DG Abd Portable 1V-Small Bowel Obstruction Protocol-initial, 8 hr delay  Result Date: 06/07/2022 CLINICAL DATA:  Small-bowel obstruction, 8 hour delay EXAM: PORTABLE ABDOMEN - 1 VIEW COMPARISON:  06/07/2022, 8:17 a.m. FINDINGS: Nonobstructive pattern of bowel gas. The colon is opacified in its entirety by enteric contrast. Esophagogastric tube with tip and side port below the  diaphragm. IMPRESSION: Nonobstructive pattern of bowel gas. The colon is opacified in its entirety by enteric contrast. Electronically Signed   By: Delanna Ahmadi M.D.   On: 06/07/2022 19:11    Cardiac Studies   Echocardiogram: 01/2018 Study Conclusions   - Left ventricle: The cavity size was normal. Wall thickness was    increased in a pattern of moderate LVH. Systolic function was    vigorous. The estimated ejection fraction was in the range of 65%    to 70%. Wall motion was normal; there were no regional wall    motion abnormalities. Left ventricular diastolic function    parameters were normal for the patient&'s age.  - Aortic valve: Mildly to moderately calcified annulus. Trileaflet.    Mean gradient (S): 5 mm Hg. Valve area (VTI): 3.67 cm^2.  - Mitral valve: Mildly calcified annulus. There was trivial    regurgitation.  - Right atrium: Central venous pressure (est): 8 mm Hg.  - Atrial septum: The septum bowed from right to left, consistent    with increased right atrial pressure.  - Tricuspid valve: There was mild regurgitation.  - Pulmonary arteries: Systolic pressure was severely increased. PA    peak pressure: 72 mm Hg (S).  - Pericardium, extracardiac: There was no pericardial effusion.    R/LHC: 06/2018 1. Mildly elevated filling pressures.  2. Normal cardiac output.  3. Moderate pulmonary artery hypertension.  4. No obstructive coronary disease   Pulmonary hypertension, probably group 3 from chronic bronchitis and OHS/OSA.   Patient Profile     71 y.o. male with a hx of pulmonary HTN (felt to be Group 3), HTN, HLD, Type II DM, chronic hypoxic respiratory failure (on 4 L nasal cannula at baseline) and OSA now admitted with SBO and then acute CHF.    Assessment & Plan    Acute Hypoxic Respiratory failure with volume overload.  WHO Group 3 Pulmonary Hypertension  --neg 11L since admit and wt down from 128.7 Kg to 124.3 Kg (lost 9.6 lbs) -has been off lasix for  several days, will need to remain on home lasix --AKI - creatinine has improved, now down to 1.23.  --Was on lasix 40  mg daily at home - will restart today - Will need outpatient RHC and follow-up with Dr. Aundra Dubin in HF clinic for pulmonary hypertension, which is now severe - ?candidate for [facility].  Elevated troponin at 61  --likely demand ischemia from hypoxia --hx non obstructive CAD --echo without wall motion abnormalities  CAD- non obstructive with cath 2019.   Small bowel obstruction. --Preoperative Cardiac Clearance for Exploratory Laparotomy - Resolved, no need for surgery, eating with +BM's  HTN   --BP 143/57 to 131/74 --toprol XL 50 mg daily - continue to hold amlodipine and clonidine - Ok to restart losartan at lower dose 25 mg daily  DM-2 /OSA PER IM  Close to d/c - probably tomorrow     For questions or updates, please contact [place] Please consult [email or web address] for contact info under   Pixie Casino, MD, FACC, [place] Director of the [facility] Diplomate of the American Board of Clinical Lipidology Attending Cardiologist  Direct Dial: [phone]  Fax: [phone]  Website:  [email or web address]  Pixie Casino, MD  06/09/2022, 9:13 AM

## 2022-06-09 NOTE — Assessment & Plan Note (Signed)
-  Per cardiology acute on chronic pulmonary hypertension --Cardiology concern for severe pulmonary hypertension -P.o. Lasix initiated today 06/09/2022, Once stable patient is to follow-up with Dr. Aundra Dubin in HF clinic for pulmonary hypertension>>will likely need RHC

## 2022-06-09 NOTE — TOC Initial Note (Signed)
Transition of Care (TOC) - Initial/Assessment Note    Patient Details  Name: Colton Jackson. MRN: [id number] Date of Birth: 13-May-1951  Transition of Care [facility]) CM/SW Contact:    Ihor Gully, LCSW Phone Number: 06/09/2022, 11:21 AM  Clinical Narrative:                 Patient from home. Admitted for SBO. PT recommends HHPT. Patient is agreeable to HHPT. [facility] is accepting of patient's insurance and accepts referral.   Expected Discharge Plan: [place] Barriers to Discharge: Continued Medical Work up   Patient Goals and CMS Choice Patient states their goals for this hospitalization and ongoing recovery are:: return home      Expected Discharge Plan and Services Expected Discharge Plan: [place] Arranged: PT [facility] Agency: [place] ([place]) Date [facility] Agency Contacted: 06/09/22 Time HH Agency Contacted: 1120 Representative spoke with at [place]: Vaughan Basta  Prior Living Arrangements/Services     Patient language and need for interpreter reviewed:: Yes Do you feel safe going back to the place where you live?: Yes      Need for Family Participation in Patient Care: No (Comment) Care giver support system in place?: No (comment)   Criminal Activity/Legal Involvement Pertinent to Current Situation/Hospitalization: No - Comment as needed  Activities of Daily Living Home Assistive Devices/Equipment: None ADL Screening (condition at time of admission) Patient's cognitive ability adequate to safely complete daily activities?: Yes Is the patient deaf or have difficulty hearing?: No Does the patient have difficulty seeing, even when wearing glasses/contacts?: No Does the patient have difficulty concentrating, remembering, or making decisions?: No Patient able to express need for assistance with ADLs?: Yes Does the patient have difficulty dressing or bathing?: No Independently performs  ADLs?: Yes (appropriate for developmental age) Does the patient have difficulty walking or climbing stairs?: Yes Weakness of Legs: Both Weakness of Arms/Hands: None  Permission Sought/Granted                  Emotional Assessment     Affect (typically observed): Appropriate Orientation: : Oriented to Self, Oriented to Place, Oriented to  Time, Oriented to Situation Alcohol / Substance Use: Not Applicable    Admission diagnosis:  SBO (small bowel obstruction) ([place]) [K56.609] Patient Active Problem List   Diagnosis Date Noted   Debility 06/08/2022   Acute on chronic respiratory failure with hypoxia ([place]) 06/05/2022    Class: Acute   Elevated serum creatinine 06/04/2022   Diabetes mellitus type 2 in obese ([place]) 06/04/2022   Leukocytosis 06/04/2022   Vitreomacular adhesion of left eye 12/16/2020   Severe nonproliferative diabetic retinopathy of left eye ([place]) 05/30/2020   Severe nonproliferative diabetic retinopathy of right eye ([place]) 05/30/2020   Primary open angle glaucoma of both eyes, mild stage 05/30/2020   Fatigue 02/27/2020   Special screening for malignant neoplasms, colon    SBO (small bowel obstruction) ([place]) 01/14/2019   Chronic respiratory failure with hypoxia ([place]) 11/12/2018   AB (asthmatic bronchitis), moderate persistent, uncomplicated 74/06/1447   Dyspnea on exertion 09/09/2016   Acute bronchitis 10/01/2015   Obesity hypoventilation syndrome ([place]) 12/21/2011   Degenerative disc disease 12/21/2011   MORBID OBESITY 06/25/2008   Obstructive sleep apnea 06/25/2008   Essential hypertension 06/25/2008  Seasonal and perennial allergic rhinitis 06/25/2008   BACK PAIN, CHRONIC 06/25/2008   PCP:  Sharilyn Sites, MD Pharmacy:   [facility] - [address] [place] [address] Phone: [phone] Fax: [place], [place]. [address] [place] [address] Phone: [phone] Fax: [phone]     Social Determinants of Health (SDOH) Interventions    Readmission Risk Interventions     No data to display

## 2022-06-09 NOTE — Care Management Important Message (Signed)
Important Message  Patient Details  Name: Colton Jackson. MRN: [id number] Date of Birth: [birth year]   Medicare Important Message Given:  Yes     Tommy Medal 06/09/2022, 10:37 AM

## 2022-06-09 NOTE — Inpatient Diabetes Management (Signed)
Inpatient Diabetes Program Recommendations  AACE/ADA: New Consensus Statement on Inpatient Glycemic Control   Target Ranges:  Prepandial:   less than 140 mg/dL      Peak postprandial:   less than 180 mg/dL (1-2 hours)      Critically ill patients:  140 - 180 mg/dL    Latest Reference Range & Units 06/08/22 07:31 06/08/22 11:06 06/08/22 16:30 06/08/22 21:57 06/09/22 07:09 06/09/22 11:08  Glucose-Capillary 70 - 99 mg/dL 132 (H) 179 (H) 244 (H) 190 (H) 155 (H) 252 (H)   Review of Glycemic Control  Diabetes history: DM2 Outpatient Diabetes medications: Glipizide 10 mg BID, Metformin XR 1000 mg BID Current orders for Inpatient glycemic control: Levemir 10 units BID, Novolog 0-15 units TID with meals, Novolog 0-5 units QHS  Inpatient Diabetes Program Recommendations:    Insulin: Please consider ordering Novolog 3 units TID with meals for meal coverage if patient eats at least 50% of meals.  Thanks, Barnie Alderman, RN, MSN, [place] Diabetes Coordinator Inpatient Diabetes Program [phone] (Team Pager from 8am to [place])

## 2022-06-10 DIAGNOSIS — E876 Hypokalemia: Secondary | ICD-10-CM

## 2022-06-10 DIAGNOSIS — J9621 Acute and chronic respiratory failure with hypoxia: Secondary | ICD-10-CM | POA: Diagnosis not present

## 2022-06-10 DIAGNOSIS — I272 Pulmonary hypertension, unspecified: Secondary | ICD-10-CM | POA: Diagnosis not present

## 2022-06-10 DIAGNOSIS — K56609 Unspecified intestinal obstruction, unspecified as to partial versus complete obstruction: Secondary | ICD-10-CM | POA: Diagnosis not present

## 2022-06-10 DIAGNOSIS — I10 Essential (primary) hypertension: Secondary | ICD-10-CM | POA: Diagnosis not present

## 2022-06-10 DIAGNOSIS — N179 Acute kidney failure, unspecified: Secondary | ICD-10-CM | POA: Diagnosis not present

## 2022-06-10 LAB — BASIC METABOLIC PANEL
Anion gap: 10 (ref 5–15)
BUN: 11 mg/dL (ref 8–23)
CO2: 32 mmol/L (ref 22–32)
Calcium: 8.4 mg/dL — ABNORMAL LOW (ref 8.9–10.3)
Chloride: 95 mmol/L — ABNORMAL LOW (ref 98–111)
Creatinine, Ser: 1.11 mg/dL (ref 0.61–1.24)
GFR, Estimated: >60 mL/min (ref >60)
Glucose, Bld: 188 mg/dL — ABNORMAL HIGH (ref 70–99)
Potassium: 3.2 mmol/L — ABNORMAL LOW (ref 3.5–5.1)
Sodium: 137 mmol/L (ref 135–145)

## 2022-06-10 LAB — GLUCOSE, CAPILLARY
Glucose-Capillary: 189 mg/dL — ABNORMAL HIGH (ref 70–99)
Glucose-Capillary: 329 mg/dL — ABNORMAL HIGH (ref 70–99)

## 2022-06-10 MED ORDER — POTASSIUM CHLORIDE CRYS ER 10 MEQ PO TBCR: 10.0000 meq | EXTENDED_RELEASE_TABLET | Freq: Every day | ORAL | Status: DC

## 2022-06-10 MED ORDER — LOSARTAN POTASSIUM 50 MG PO TABS: 50.0000 mg | ORAL_TABLET | Freq: Every day | ORAL | 1 refills | Status: DC

## 2022-06-10 MED ORDER — POTASSIUM CHLORIDE CRYS ER 10 MEQ PO TBCR: 10.0000 meq | EXTENDED_RELEASE_TABLET | Freq: Every day | ORAL | 0 refills | Status: DC

## 2022-06-10 MED ORDER — LOSARTAN POTASSIUM 50 MG PO TABS
50.0000 mg | ORAL_TABLET | Freq: Every day | ORAL | Status: DC
  Administered 2022-06-10: 50 mg via ORAL
  Filled 2022-06-10: qty 1

## 2022-06-10 MED ORDER — POTASSIUM CHLORIDE CRYS ER 20 MEQ PO TBCR
40.0000 meq | EXTENDED_RELEASE_TABLET | Freq: Once | ORAL | Status: AC
  Administered 2022-06-10: 40 meq via ORAL
  Filled 2022-06-10: qty 2

## 2022-06-10 NOTE — Inpatient Diabetes Management (Signed)
Inpatient Diabetes Program Recommendations  AACE/ADA: New Consensus Statement on Inpatient Glycemic Control   Target Ranges:  Prepandial:   less than 140 mg/dL      Peak postprandial:   less than 180 mg/dL (1-2 hours)      Critically ill patients:  140 - 180 mg/dL    Latest Reference Range & Units 06/09/22 07:09 06/09/22 11:08 06/09/22 16:12 06/09/22 21:39 06/10/22 07:37  Glucose-Capillary 70 - 99 mg/dL 155 (H) 252 (H) 217 (H) 252 (H) 189 (H)   Review of Glycemic Control  Diabetes history: DM2 Outpatient Diabetes medications: Glipizide 10 mg BID, Metformin XR 1000 mg BID Current orders for Inpatient glycemic control: Levemir 10 units BID, Novolog 0-15 units TID with meals, Novolog 0-5 units QHS   Inpatient Diabetes Program Recommendations:     Insulin: Please consider ordering Novolog 3 units TID with meals for meal coverage if patient eats at least 50% of meals.   Thanks, Barnie Alderman, RN, MSN, [place] Diabetes Coordinator Inpatient Diabetes Program [phone] (Team Pager from 8am to [place])

## 2022-06-10 NOTE — Progress Notes (Addendum)
5 Days Post-Op  Subjective: Tolerating heart healthy diet well.  He is having multiple bowel movements.  They are loose.  Objective: Vital signs in last 24 hours: Temp:  [97.4 F (36.3 C)-98.1 F (36.7 C)] 97.8 F (36.6 C) (07/26 0448) Pulse Rate:  [77-86] 77 (07/26 0448) Resp:  [18-20] 20 (07/26 0448) BP: (128-144)/(67-74) 144/71 (07/26 0448) SpO2:  [93 %-99 %] 99 % (07/26 0448) Last BM Date : 06/09/22  Intake/Output from previous day: 07/25 0701 - 07/26 0700 In: 1080 [P.O.:1080] Out: -  Intake/Output this shift: Total I/O In: 356 [P.O.:356] Out: -   General appearance: alert, cooperative, and no distress GI: soft, non-tender; bowel sounds normal; no masses,  no organomegaly  Lab Results:  Recent Labs    06/08/22 0500 06/09/22 0519  WBC 11.1* 7.9  HGB 11.9* 11.2*  HCT 37.3* 34.4*  PLT 314 280   BMET Recent Labs    06/09/22 0519 06/10/22 0541  NA 139 137  K 3.0* 3.2*  CL 98 95*  CO2 32 32  GLUCOSE 153* 188*  BUN 15 11  CREATININE 1.23 1.11  CALCIUM 8.2* 8.4*   PT/INR No results for input(s): "LABPROT", "INR" in the last 72 hours.  Studies/Results: ECHOCARDIOGRAM COMPLETE  Result Date: 06/08/2022    ECHOCARDIOGRAM REPORT   Patient Name:   Colton Leeds. Date of Exam: 06/08/2022 Medical Rec #:  [id number]        Height:       67.0 in Accession #:    [id number]       Weight:       274.0 lb Date of Birth:  01/19/1951         BSA:          2.312 m Patient Age:    71 years         BP:           136/59 mmHg Patient Gender: M                HR:           91 bpm. Exam Location:  [facility] Procedure: 2D Echo, Cardiac Doppler and Color Doppler Indications:    R06.00 Dyspnea  History:        Patient has prior history of Echocardiogram examinations, most                 recent 01/24/2018. Risk Factors:Hypertension and Diabetes.                 Obstructive sleep apnea. MORBID OBESITY.  Sonographer:    Alvino Chapel RCS Referring Phys: [place]  1. Left  ventricular ejection fraction, by estimation, is 70 to 75%. The left ventricle has hyperdynamic function. The left ventricle has no regional wall motion abnormalities. There is moderate left ventricular hypertrophy. Left ventricular diastolic parameters are consistent with Grade I diastolic dysfunction (impaired relaxation). There is the interventricular septum is flattened in systole and diastole, consistent with right ventricular pressure and volume overload.  2. Right ventricular systolic function is normal. The right ventricular size is mildly enlarged. There is severely elevated pulmonary artery systolic pressure. The estimated right ventricular systolic pressure is 65.4 mmHg.  3. Right atrial size was mildly dilated.  4. The mitral valve is grossly normal. Trivial mitral valve regurgitation.  5. The aortic valve is tricuspid. Aortic valve regurgitation is not visualized.  6. The inferior vena cava is dilated in size with >50% respiratory variability,  suggesting right atrial pressure of 8 mmHg. Comparison(s): Prior images unable to be directly viewed. FINDINGS  Left Ventricle: Left ventricular ejection fraction, by estimation, is 70 to 75%. The left ventricle has hyperdynamic function. The left ventricle has no regional wall motion abnormalities. The left ventricular internal cavity size was normal in size. There is moderate left ventricular hypertrophy. The interventricular septum is flattened in systole and diastole, consistent with right ventricular pressure and volume overload. Left ventricular diastolic parameters are consistent with Grade I diastolic dysfunction (impaired relaxation). Right Ventricle: The right ventricular size is mildly enlarged. No increase in right ventricular wall thickness. Right ventricular systolic function is normal. There is severely elevated pulmonary artery systolic pressure. The tricuspid regurgitant velocity is 4.16 m/s, and with an assumed right atrial pressure of 8 mmHg,  the estimated right ventricular systolic pressure is 28.0 mmHg. Left Atrium: Left atrial size was normal in size. Right Atrium: Right atrial size was mildly dilated. Pericardium: There is no evidence of pericardial effusion. Mitral Valve: The mitral valve is grossly normal. Trivial mitral valve regurgitation. Tricuspid Valve: The tricuspid valve is grossly normal. Tricuspid valve regurgitation is mild. Aortic Valve: The aortic valve is tricuspid. There is mild aortic valve annular calcification. Aortic valve regurgitation is not visualized. Pulmonic Valve: The pulmonic valve was grossly normal. Pulmonic valve regurgitation is trivial. Aorta: The aortic root is normal in size and structure. Venous: The inferior vena cava is dilated in size with greater than 50% respiratory variability, suggesting right atrial pressure of 8 mmHg. IAS/Shunts: There is left bowing of the interatrial septum, suggestive of elevated right atrial pressure. No atrial level shunt detected by color flow Doppler.  LEFT VENTRICLE PLAX 2D LVIDd:         4.00 cm   Diastology LVIDs:         2.10 cm   LV e' medial:    9.46 cm/s LV PW:         1.30 cm   LV E/e' medial:  7.2 LV IVS:        1.50 cm   LV e' lateral:   12.00 cm/s LVOT diam:     2.10 cm   LV E/e' lateral: 5.7 LV SV:         81 LV SV Index:   35 LVOT Area:     3.46 cm  RIGHT VENTRICLE RV S prime:     20.80 cm/s TAPSE (M-mode): 2.8 cm LEFT ATRIUM             Index        RIGHT ATRIUM           Index LA diam:        3.20 cm 1.38 cm/m   RA Area:     23.50 cm LA Vol (A2C):   73.1 ml 31.62 ml/m  RA Volume:   81.20 ml  35.12 ml/m LA Vol (A4C):   42.5 ml 18.38 ml/m LA Biplane Vol: 56.2 ml 24.31 ml/m  AORTIC VALVE LVOT Vmax:   137.00 cm/s LVOT Vmean:  82.400 cm/s LVOT VTI:    0.234 m  AORTA Ao Root diam: 3.50 cm MITRAL VALVE               TRICUSPID VALVE MV Area (PHT): 3.60 cm    TR Peak grad:   69.2 mmHg MV Decel Time: 211 msec    TR Vmax:        416.00 cm/s MV E velocity: 67.90 cm/s MV A  velocity: 90.80 cm/s  SHUNTS MV E/A ratio:  0.75        Systemic VTI:  0.23 m                            Systemic Diam: 2.10 cm Rozann Lesches MD Electronically signed by Rozann Lesches MD Signature Date/Time: 06/08/2022/3:17:29 PM    Final    DG Abd Portable 1V-Small Bowel Obstruction Protocol-24 hr delay  Result Date: 06/08/2022 CLINICAL DATA:  Small bowel obstruction EXAM: PORTABLE ABDOMEN - 1 VIEW COMPARISON:  06/07/2022 FINDINGS: Oral contrast material opacifies the distal small bowel and colon to the level of the rectum. No bowel dilatation to suggest obstruction. No evidence of pneumoperitoneum, portal venous gas or pneumatosis. No pathologic calcifications along the expected course of the ureters. No acute osseous abnormality. IMPRESSION: 1. No evidence of bowel obstruction. Electronically Signed   By: Kathreen Devoid M.D.   On: 06/08/2022 10:54    Anti-infectives: Anti-infectives (From admission, onward)    None       Assessment/Plan: Impression: Partial small bowel obstruction, resolved Plan: No need for acute surgical intervention.  Patient is a high risk surgical candidate due to his pulmonary hypertension.  I did instruct him to continue MiraLAX and stool softeners as needed.  Will sign off.  Please call us if we can be of further assistance.  LOS: 7 days    [place] Signs 06/10/2022

## 2022-06-10 NOTE — Discharge Summary (Signed)
Physician Discharge Summary   Patient: Colton Jackson. MRN: [id number] DOB: 1951/10/01  Admit date:     06/03/2022  Discharge date: 06/10/22  Discharge Physician: Shanon Brow Emonni Depasquale   PCP: Sharilyn Sites, MD   Recommendations at discharge:   Please follow up with primary care provider within 1-2 weeks  Please repeat BMP and CBC in one week     Hospital Course: Colton Jackson. is a 71 y.o. male with medical history significant of neck artery aneurysm, hypertension, GERD, OSA, thyroid nodule, diabetes mellitus type 2, and more presents the ED with a chief complaint of abdominal distention and nausea vomiting.   Patient reports that his abdomen started getting distended the day prior to presentation.  On the day of presentation, he reports vomiting that first look like undigested food and then lifted and felt like feces.  Patient reports no blood in his emesis, but the NG tube does have blood in the output.  Patient reports that his last bowel movement was 1 week ago.  Approximately 6 weeks ago he had 5-6 days of diarrhea that spontaneously resolved.  Patient reports he has had abdominal pain for approximately 1 week that feels like fullness.  Reports has been progressively worse since it started.  He has never had symptoms like this before.  He does report chest pain that is associated with the abdomen.  He reports that it feels like indigestion coming up from his abdomen.  He has had dyspnea that is chronic for him.  He is dyspnea is worse with exertion.  He wears 4 L nasal cannula at home.  Patient denies any fevers, chills, or other symptoms.   Patient does not smoke, does not drink, does not use illicit drugs.  He is vaccinated for COVID.  Patient is full code.  ED:  Temp 98.1, heart rate 100-111, respiratory rate 10-40, blood pressure 107/55-174/109, satting 85-100% Leukocytosis 14.5, hemoglobin 13.1 Hypokalemia at 3.4 CT abdomen pelvis shows small bowel obstruction with right lower quadrant  transition zone likely due to adhesions.  Patient does have a history of appendectomy as a child General surgery was consulted and recommends suppositories, NG tube, n.p.o. Admission was requested for small bowel obstruction management  Assessment and Plan: * SBO (small bowel obstruction) ([place]) -Resolved with medical management -Tolerating soft diet and having BMs -cleared by surgery for d/c -KUB small bowel follow-through revealing contrast throughout the colon -General surgery was following, signed off -NG tube discontinued -initially on IVF -Hemoccult also positive, likely due to SBO,no melena or bleeding was noted, H&H stable Follow-up as an outpatient with a gastroenterologist  Pulmonary hypertension ([place]) - Per cardiology acute on chronic pulmonary hypertension --Cardiology concern for severe pulmonary hypertension -P.o. Lasix initiated today 06/09/2022, Once stable patient is to follow-up with Dr. Aundra Dubin in HF clinic for pulmonary hypertension>>will likely need RHC  Acute on chronic respiratory failure with hypoxia ([place]) -saturation 96-98% on 5L at time of d/c -On this admission patient was as high as 11 L of oxygen, -Improved tachycardia, tachypnea -Hypoxic off supplemental oxygen -IV fluids discontinued, IVLasix was given x2 06/05/2022 Chest x-ray personally reviewed--  cardiomegaly and vascular congestion.  -Cardiology concern as patient's pulmonary hypertension is contributing to acute on chronic respiratory failure>>outpt referral to advanced HF clinic/Dr. Aundra Dubin with whom pt had lost follow up -d/c home with lasix 40 mg po daily  Hypokalemia - Monitoring and repleting orally -d/c home with KCl 10 daily  AKI (acute kidney injury) ([place]) Baseline creatinine 1.0-1.2 Serum creatinine  peaked 1.88 Serum creatinine 1.11 on day of d/c  Debility - Acute on chronic -PT/OT evaluation recommendation, fall precautions -Candidate for home health  Leukocytosis - White blood  cell count 14.5 >> > 11.1, 7.9 -Technically meets SIRS criteria, but likely from pain and not from infection, afebrile, -sepsis ruled out -Leukocytosis is thought to be secondary to SBO -No antibiotics at this time   Diabetes mellitus type 2 in obese ([facility]) - Hold metformin and glipizide CBG (last 3)  Recent Labs    06/08/22 2157 06/09/22 0709 06/09/22 1108  GLUCAP 190* 155* 252*    -Tolerating p.o., switching CBG to q. ACHS with SSI coverage -Starting clear liquid diet, titrating insulin accordingly -Continue to monitor -Adding long-acting insulin-increasing dose accordingly -d/c home with metformin and glipizide -06/04/22 A1C--6.9  Essential hypertension - Restarting: losartan, Toprol-XL, -Continue to hold amlodipine and clonidine  -monitor off amlodipine and clonidine after d/c -Continue to monitor -BP stabilizing         Consultants: cardiology Procedures performed: none  Disposition: Home Diet recommendation:  Carb modified diet DISCHARGE MEDICATION: Allergies as of 06/10/2022       Reactions   Covid-19 Mrna Vaccine (pfizer) [covid-19 Mrna Vacc (moderna)] Other (See Comments)   Joint pain and fatigue   Sulfa Antibiotics Swelling   eyelids        Medication List     STOP taking these medications    amLODipine 10 MG tablet Commonly known as: NORVASC   cloNIDine 0.2 MG tablet Commonly known as: Catapres   mirabegron ER 50 MG Tb24 tablet Commonly known as: MYRBETRIQ   terbinafine 250 MG tablet Commonly known as: LAMISIL       TAKE these medications    acetaminophen 500 MG tablet Commonly known as: TYLENOL Take 500 mg by mouth 3 (three) times daily.   cromolyn 5.2 MG/ACT nasal spray Commonly known as: NASALCROM Place 1 spray into both nostrils 3 (three) times daily as needed for allergies.   famotidine 20 MG tablet Commonly known as: PEPCID Take 20 mg by mouth 2 (two) times daily.   furosemide 40 MG tablet Commonly known as:  LASIX Take 40 mg by mouth daily.   glipiZIDE 10 MG tablet Commonly known as: GLUCOTROL Take 10 mg by mouth 2 (two) times daily.   HYDROcodone-acetaminophen 10-325 MG tablet Commonly known as: NORCO Take 1 tablet by mouth 3 (three) times daily as needed for moderate pain.   Krill Oil 500 MG Caps Take 500 mg by mouth daily.   latanoprost 0.005 % ophthalmic solution Commonly known as: XALATAN INSTILL 1 DROP IN BOTH EYES EVERY DAY AT BEDTIME- NEEDS TO FOLLOWUP WITH GLAUCOMA DOCTOR FOR REFILLS What changed: See the new instructions.   losartan 50 MG tablet Commonly known as: COZAAR Take 1 tablet (50 mg total) by mouth daily. Start taking on: June 11, 2022 What changed:  medication strength how much to take   meclizine 25 MG tablet Commonly known as: ANTIVERT Take 25 mg by mouth 3 (three) times daily as needed for dizziness or nausea (when taking hydrocodone/apap).   meloxicam 7.5 MG tablet Commonly known as: MOBIC Take 7.5 mg by mouth 2 (two) times daily.   metFORMIN 500 MG 24 hr tablet Commonly known as: GLUCOPHAGE-XR Take 1,000 mg by mouth 2 (two) times daily.   metoprolol succinate 50 MG 24 hr tablet Commonly known as: TOPROL-XL Take 1 tablet (50 mg total) by mouth daily.   mupirocin ointment 2 % Commonly known as: BACTROBAN 1  application 2 (two) times daily.   OXYGEN Inhale 5 L/hr into the lungs daily.   potassium chloride 10 MEQ tablet Commonly known as: KLOR-CON M Take 1 tablet (10 mEq total) by mouth daily. Start taking on: June 11, 2022   psyllium 58.6 % powder Commonly known as: METAMUCIL Take 1 packet by mouth 2 (two) times daily.   trospium 20 MG tablet Commonly known as: SANCTURA Take 1 tablet (20 mg total) by mouth 2 (two) times daily.   Vitamin D3 10 MCG (400 UNIT) Caps Take 1 capsule by mouth daily.        Follow-up Information     [facility].   Specialty: Home Health Services Why: Home health physical  therapy will be provided by [facility]).        Larey Dresser, MD Follow up.   Specialty: Cardiology Why: The office should contact you within 2-3 business days to arrange follow-up.  If you do not hear from them within this timeframe, please call the number provided. Contact information: [place] [address] [phone]                Discharge Exam: Danley Danker Weights   06/04/22 5916 06/07/22 0400 06/08/22 0300  Weight: 128.7 kg 126.3 kg 124.3 kg   HEENT:  [place]/AT, No thrush, no icterus CV:  RRR, no rub, no S3, no S4 Lung:  CTA, no wheeze, no rhonchi Abd:  soft/+BS, NT Ext:  trace LE edema, no lymphangitis, no synovitis, no rash   Condition at discharge: stable  The results of significant diagnostics from this hospitalization (including imaging, microbiology, ancillary and laboratory) are listed below for reference.   Imaging Studies: ECHOCARDIOGRAM COMPLETE  Result Date: 06/08/2022    ECHOCARDIOGRAM REPORT   Patient Name:   Orpheus Hayhurst. Date of Exam: 06/08/2022 Medical Rec #:  [id number]        Height:       67.0 in Accession #:    [id number]       Weight:       274.0 lb Date of Birth:  08/25/1951         BSA:          2.312 m Patient Age:    69 years         BP:           136/59 mmHg Patient Gender: M                HR:           91 bpm. Exam Location:  [facility] Procedure: 2D Echo, Cardiac Doppler and Color Doppler Indications:    R06.00 Dyspnea  History:        Patient has prior history of Echocardiogram examinations, most                 recent 01/24/2018. Risk Factors:Hypertension and Diabetes.                 Obstructive sleep apnea. MORBID OBESITY.  Sonographer:    Alvino Chapel RCS Referring Phys: [place]  1. Left ventricular ejection fraction, by estimation, is 70 to 75%. The left ventricle has hyperdynamic function. The left ventricle has no regional wall motion abnormalities. There is moderate  left ventricular hypertrophy. Left ventricular diastolic parameters are consistent with Grade I diastolic dysfunction (impaired relaxation). There is the interventricular septum is flattened in systole  and diastole, consistent with right ventricular pressure and volume overload.  2. Right ventricular systolic function is normal. The right ventricular size is mildly enlarged. There is severely elevated pulmonary artery systolic pressure. The estimated right ventricular systolic pressure is 60.7 mmHg.  3. Right atrial size was mildly dilated.  4. The mitral valve is grossly normal. Trivial mitral valve regurgitation.  5. The aortic valve is tricuspid. Aortic valve regurgitation is not visualized.  6. The inferior vena cava is dilated in size with >50% respiratory variability, suggesting right atrial pressure of 8 mmHg. Comparison(s): Prior images unable to be directly viewed. FINDINGS  Left Ventricle: Left ventricular ejection fraction, by estimation, is 70 to 75%. The left ventricle has hyperdynamic function. The left ventricle has no regional wall motion abnormalities. The left ventricular internal cavity size was normal in size. There is moderate left ventricular hypertrophy. The interventricular septum is flattened in systole and diastole, consistent with right ventricular pressure and volume overload. Left ventricular diastolic parameters are consistent with Grade I diastolic dysfunction (impaired relaxation). Right Ventricle: The right ventricular size is mildly enlarged. No increase in right ventricular wall thickness. Right ventricular systolic function is normal. There is severely elevated pulmonary artery systolic pressure. The tricuspid regurgitant velocity is 4.16 m/s, and with an assumed right atrial pressure of 8 mmHg, the estimated right ventricular systolic pressure is 37.1 mmHg. Left Atrium: Left atrial size was normal in size. Right Atrium: Right atrial size was mildly dilated. Pericardium: There is  no evidence of pericardial effusion. Mitral Valve: The mitral valve is grossly normal. Trivial mitral valve regurgitation. Tricuspid Valve: The tricuspid valve is grossly normal. Tricuspid valve regurgitation is mild. Aortic Valve: The aortic valve is tricuspid. There is mild aortic valve annular calcification. Aortic valve regurgitation is not visualized. Pulmonic Valve: The pulmonic valve was grossly normal. Pulmonic valve regurgitation is trivial. Aorta: The aortic root is normal in size and structure. Venous: The inferior vena cava is dilated in size with greater than 50% respiratory variability, suggesting right atrial pressure of 8 mmHg. IAS/Shunts: There is left bowing of the interatrial septum, suggestive of elevated right atrial pressure. No atrial level shunt detected by color flow Doppler.  LEFT VENTRICLE PLAX 2D LVIDd:         4.00 cm   Diastology LVIDs:         2.10 cm   LV e' medial:    9.46 cm/s LV PW:         1.30 cm   LV E/e' medial:  7.2 LV IVS:        1.50 cm   LV e' lateral:   12.00 cm/s LVOT diam:     2.10 cm   LV E/e' lateral: 5.7 LV SV:         81 LV SV Index:   35 LVOT Area:     3.46 cm  RIGHT VENTRICLE RV S prime:     20.80 cm/s TAPSE (M-mode): 2.8 cm LEFT ATRIUM             Index        RIGHT ATRIUM           Index LA diam:        3.20 cm 1.38 cm/m   RA Area:     23.50 cm LA Vol (A2C):   73.1 ml 31.62 ml/m  RA Volume:   81.20 ml  35.12 ml/m LA Vol (A4C):   42.5 ml 18.38 ml/m LA Biplane Vol: 56.2 ml  24.31 ml/m  AORTIC VALVE LVOT Vmax:   137.00 cm/s LVOT Vmean:  82.400 cm/s LVOT VTI:    0.234 m  AORTA Ao Root diam: 3.50 cm MITRAL VALVE               TRICUSPID VALVE MV Area (PHT): 3.60 cm    TR Peak grad:   69.2 mmHg MV Decel Time: 211 msec    TR Vmax:        416.00 cm/s MV E velocity: 67.90 cm/s MV A velocity: 90.80 cm/s  SHUNTS MV E/A ratio:  0.75        Systemic VTI:  0.23 m                            Systemic Diam: 2.10 cm Rozann Lesches MD Electronically signed by Rozann Lesches  MD Signature Date/Time: 06/08/2022/3:17:29 PM    Final    DG Abd Portable 1V-Small Bowel Obstruction Protocol-24 hr delay  Result Date: 06/08/2022 CLINICAL DATA:  Small bowel obstruction EXAM: PORTABLE ABDOMEN - 1 VIEW COMPARISON:  06/07/2022 FINDINGS: Oral contrast material opacifies the distal small bowel and colon to the level of the rectum. No bowel dilatation to suggest obstruction. No evidence of pneumoperitoneum, portal venous gas or pneumatosis. No pathologic calcifications along the expected course of the ureters. No acute osseous abnormality. IMPRESSION: 1. No evidence of bowel obstruction. Electronically Signed   By: Kathreen Devoid M.D.   On: 06/08/2022 10:54   DG Abd Portable 1V-Small Bowel Obstruction Protocol-initial, 8 hr delay  Result Date: 06/07/2022 CLINICAL DATA:  Small-bowel obstruction, 8 hour delay EXAM: PORTABLE ABDOMEN - 1 VIEW COMPARISON:  06/07/2022, 8:17 a.m. FINDINGS: Nonobstructive pattern of bowel gas. The colon is opacified in its entirety by enteric contrast. Esophagogastric tube with tip and side port below the diaphragm. IMPRESSION: Nonobstructive pattern of bowel gas. The colon is opacified in its entirety by enteric contrast. Electronically Signed   By: Delanna Ahmadi M.D.   On: 06/07/2022 19:11   DG Abd Portable 1V-Small Bowel Protocol-Position Verification  Result Date: 06/07/2022 CLINICAL DATA:  Nasogastric tube placement. EXAM: PORTABLE ABDOMEN - 1 VIEW COMPARISON:  06/06/2022 FINDINGS: Nasogastric tube is looped once over the gastric fundus and has tip over the right upper quadrant likely over the distal stomach or proximal duodenum as this is unchanged. Remainder of the exam is unchanged. IMPRESSION: Nasogastric tube with tip over the right upper quadrant likely over the distal stomach or proximal duodenum unchanged. Electronically Signed   By: Marin Olp M.D.   On: 06/07/2022 08:32   DG Abd 1 View  Result Date: 06/06/2022 CLINICAL DATA:  Nasogastric tube.  EXAM: ABDOMEN - 1 VIEW COMPARISON:  06/06/2022. FINDINGS: A mildly distended loop of small bowel is noted in the upper abdomen measuring 3.2 cm. An NG tube terminates in the stomach in the stomach is decompressed from the prior exam. No radio-opaque calculi. Mild atelectasis is present at the left lung base. IMPRESSION: 1. Mildly distended loop of small bowel in the left upper quadrant, not significantly changed from the prior exam. 2. An enteric tube terminates in the stomach in the stomach is decompressed from the prior exam. Electronically Signed   By: Brett Fairy M.D.   On: 06/06/2022 04:05   DG Abd 1 View  Result Date: 06/06/2022 CLINICAL DATA:  Small-bowel obstruction. EXAM: ABDOMEN - 1 VIEW COMPARISON:  06/05/2022. FINDINGS: Examination is limited due to patient's body habitus resulting in  artifact. Mildly distended gas-filled loops of small bowel are noted in the abdomen measuring up to 3.8 cm. There is gaseous distention of the stomach. Mild residual contrast is noted in the in the colon. No radio-opaque calculi or other acute radiographic abnormality are seen. IMPRESSION: Mildly distended gas-filled loops of small bowel in the abdomen measuring up to 3.8 cm, slightly increased from the prior exam. Electronically Signed   By: Brett Fairy M.D.   On: 06/06/2022 02:21   DG Abd 1 View  Result Date: 06/05/2022 CLINICAL DATA:  Follow-up SBO EXAM: ABDOMEN - 1 VIEW COMPARISON:  Abdominal radiograph dated June 12, 2022 FINDINGS: Enteric tube tip and side port project over the stomach. Contrast material is seen throughout the colon. Mildly distended gas-filled loops of small bowel are seen in the right hemiabdomen. No contrast is seen in the rectum. IMPRESSION: Contrast material seen in the colon. Electronically Signed   By: Yetta Glassman M.D.   On: 06/05/2022 08:27   DG Chest Port 1 View  Result Date: 06/05/2022 CLINICAL DATA:  Hypoxia EXAM: PORTABLE CHEST 1 VIEW COMPARISON:  06/04/2022 FINDINGS:  Enteric tube reaches the stomach at least. Low volume chest with indistinct density in the bilateral lungs. Cardiomegaly and vascular pedicle widening. No visible effusion or pneumothorax. IMPRESSION: 1. Cardiomegaly and vascular congestion. 2. Streaky density in the bilateral lungs which could be atelectasis or infection. Electronically Signed   By: Jorje Guild M.D.   On: 06/05/2022 05:46   DG Abd Portable 1V-Small Bowel Obstruction Protocol-initial, 8 hr delay  Result Date: 06/04/2022 CLINICAL DATA:  8 hour delayed film for small-bowel follow-through. EXAM: PORTABLE ABDOMEN - 1 VIEW COMPARISON:  CT yesterday. FINDINGS: Tip and side port of the enteric tube below the diaphragm in the stomach. Stomach is distended with enteric contrast. There is no contrast in the colon. Persistent gaseous distention of small bowel in the central abdomen. IMPRESSION: 1. No contrast in the colon. Persistent small bowel obstruction with gaseous small bowel distension. 2. Gastric distension with a large amount of residual contrast in the stomach. 3. Recommend 24 hour film. Electronically Signed   By: Keith Rake M.D.   On: 06/04/2022 18:56   DG Chest Portable 1 View  Result Date: 06/04/2022 CLINICAL DATA:  NG tube placement. EXAM: PORTABLE CHEST 1 VIEW COMPARISON:  Abdominal CT earlier today FINDINGS: Tip and side port of the enteric tube below the diaphragm in the stomach. There is streaky bibasilar atelectasis. Lung volumes are low. Heart size is normal for technique. No pneumothorax or large pleural effusion. IMPRESSION: 1. Tip and side port of the enteric tube below the diaphragm in the stomach. 2. Low lung volumes with streaky bibasilar atelectasis. Electronically Signed   By: Keith Rake M.D.   On: 06/04/2022 00:28   CT ABDOMEN PELVIS W CONTRAST  Result Date: 06/03/2022 CLINICAL DATA:  Vomiting and weakness. Bowel obstruction suspected. Patient reports a have not passed gas or stool for 7 days EXAM: CT  ABDOMEN AND PELVIS WITH CONTRAST TECHNIQUE: Multidetector CT imaging of the abdomen and pelvis was performed using the standard protocol following bolus administration of intravenous contrast. RADIATION DOSE REDUCTION: This exam was performed according to the departmental dose-optimization program which includes automated exposure control, adjustment of the mA and/or kV according to patient size and/or use of iterative reconstruction technique. CONTRAST:  168m OMNIPAQUE IOHEXOL 300 MG/ML  SOLN COMPARISON:  CT 01/14/2019 FINDINGS: Lower chest: Mild cardiomegaly. Atelectasis in both lower lobes. No pleural effusion. Hepatobiliary: Mild  subjective hepatic steatosis. Gallbladder physiologically distended, no calcified stone. No biliary dilatation. Pancreas: Fatty atrophy.  No ductal dilatation or inflammation. Spleen: Normal in size without focal abnormality. Adrenals/Urinary Tract: No adrenal nodule. Two punctate nonobstructing stones in the mid left kidney. No hydronephrosis. No focal renal lesion. Urinary bladder is partially distended, no wall thickening. Stomach/Bowel: Marked fluid distention of the stomach. Dilated fluid-filled proximal small bowel, greatest small bowel dimension 5.3 cm. There is no evidence of bowel pneumatosis. Mild mesenteric edema. Transition point in the right lower quadrant series 2, image 73. The distal small bowel is decompressed. There is fecalization of distal ileal small bowel contents. Large volume of stool in the ascending, transverse, and descending colon. Small to moderate stool in the sigmoid colon. There is small sigmoid redundancy. No abnormal rectal distention. No colonic inflammation or evidence of colonic mass. Vascular/Lymphatic: Mild aortic atherosclerosis and tortuosity. Patent portal vein. No portal venous or mesenteric gas. No abdominopelvic adenopathy. Reproductive: Prostate is unremarkable. Other: No free air or ascites. Mild generalized body wall edema. No abdominal  wall hernia. Musculoskeletal: Stable sclerotic focus within T9 vertebral body, likely bone island. Degenerative change in the spine and both hips. There is fatty atrophy of the paraspinal musculature. IMPRESSION: 1. Small bowel obstruction with transition point in the right lower quadrant, likely due to adhesions. 2. Large volume of stool in the ascending, transverse, and descending colon, consistent with constipation. 3. Nonobstructing left nephrolithiasis. 4. Mild subjective hepatic steatosis. Aortic Atherosclerosis (ICD10-I70.0). Electronically Signed   By: Keith Rake M.D.   On: 06/03/2022 22:46    Microbiology: Results for orders placed or performed during the hospital encounter of 06/03/22  MRSA Next Gen by PCR, Nasal     Status: None   Collection Time: 06/05/22  3:15 PM   Specimen: Nasal Mucosa; Nasal Swab  Result Value Ref Range Status   MRSA by PCR Next Gen NOT DETECTED NOT DETECTED Final    Comment: (NOTE) The GeneXpert MRSA Assay (FDA approved for NASAL specimens only), is one component of a comprehensive MRSA colonization surveillance program. It is not intended to diagnose MRSA infection nor to guide or monitor treatment for MRSA infections. Test performance is not FDA approved in patients less than 59 years old. Performed at [facility], [address], [place], [place] [address]     Labs: CBC: Recent Labs  Lab 06/03/22 1751 06/04/22 0432 06/05/22 0647 06/06/22 0205 06/07/22 0416 06/08/22 0500 06/09/22 0519  WBC 14.5* 9.3 8.7 10.6* 9.6 11.1* 7.9  NEUTROABS 13.2* 7.6  --   --   --   --   --   HGB 13.1 12.2* 12.6* 12.7* 12.4* 11.9* 11.2*  HCT 39.6 36.4* 39.5 38.6* 38.4* 37.3* 34.4*  MCV 96.1 96.0 99.7 97.2 98.2 97.6 96.4  PLT 275 254 262 262 267 314 366   Basic Metabolic Panel: Recent Labs  Lab 06/04/22 0432 06/05/22 0647 06/06/22 0205 06/07/22 0416 06/08/22 0501 06/09/22 0519 06/10/22 0541  NA 141   < > 144 142 142 139 137  K 3.6   < > 3.0* 3.4* 3.2*  3.0* 3.2*  CL 95*   < > 100 99 101 98 95*  CO2 32   < > 32 31 29 32 32  GLUCOSE 191*   < > 239* 156* 131* 153* 188*  BUN 19   < > 23 31* 26* 15 11  CREATININE 1.23   < > 1.53* 1.88* 1.56* 1.23 1.11  CALCIUM 8.4*   < >  7.5* 7.7* 7.9* 8.2* 8.4*  MG 1.4*  --   --   --   --   --   --    < > = values in this interval not displayed.   Liver Function Tests: Recent Labs  Lab 06/04/22 0432 06/05/22 0647 06/06/22 0205 06/07/22 0416 06/08/22 0501  AST _0 14* 23  ALT _1 ALKPHOS 50 53 59 54 57  BILITOT 0.6 1.3* 1.3* 1.2 1.4*  PROT 7.6 7.5 7.7 7.0 7.0  ALBUMIN 3.8 3.6 3.5 3.2* 3.2*   CBG: Recent Labs  Lab 06/09/22 1108 06/09/22 1612 06/09/22 2139 06/10/22 0737 06/10/22 1107  GLUCAP 252* 217* 252* 189* 329*    Discharge time spent: greater than 30 minutes.  Signed: Orson Eva, MD [facility] 06/10/2022

## 2022-06-10 NOTE — Assessment & Plan Note (Signed)
Baseline creatinine 1.0-1.2 Serum creatinine peaked 1.88 Serum creatinine 1.11 on day of d/c

## 2022-06-10 NOTE — Progress Notes (Signed)
Physical Therapy Treatment Patient Details Name: Colton Jackson. MRN: [id number] DOB: 03/07/1951 Today's Date: 06/10/2022   History of Present Illness Colton Jackson. is a 71 y.o. male with medical history significant of neck artery aneurysm, hypertension, GERD, OSA, thyroid nodule, diabetes mellitus type 2, and more presents the ED with a chief complaint of abdominal distention and nausea vomiting.  Patient reports that his abdomen started getting distended the day prior to presentation.  On the day of presentation, he reports vomiting that first look like undigested food and then lifted and felt like feces.  Patient reports no blood in his emesis, but the NG tube does have blood in the output.  Patient reports that his last bowel movement was 1 week ago.  Approximately 6 weeks ago he had 5-6 days of diarrhea that spontaneously resolved.  Patient reports he has had abdominal pain for approximately 1 week that feels like fullness.  Reports has been progressively worse since it started.  He has never had symptoms like this before.  He does report chest pain that is associated with the abdomen.  He reports that it feels like indigestion coming up from his abdomen.  He has had dyspnea that is chronic for him.  He is dyspnea is worse with exertion.  He wears 4 L nasal cannula at home.  Patient denies any fevers, chills, or other symptoms.    PT Comments    Pt concerned with PT session as does not wish to pay any additional fees during hospitalization.   Pt c/o increased LBP following standing or gait though no reports of pain given through session.  Pt on 5L O2A via nasal canal with O2 saturation from 93-98% through session.  Pt independent bed mobility and ability to stand without assistance, does rely on momentum with standing from EOB.  Pt ambulated at slow cadence with RW, no LOB episodes through session.  Did require 4 standing rest breaks during gait training.  EOS pt left in chair with call bell within  reach and RN aware of status.      Recommendations for follow up therapy are one component of a multi-disciplinary discharge planning process, led by the attending physician.  Recommendations may be updated based on patient status, additional functional criteria and insurance authorization.  Follow Up Recommendations  Home health PT     Assistance Recommended at Discharge Intermittent Supervision/Assistance  Patient can return home with the following Assistance with cooking/housework   Equipment Recommendations  None recommended by PT    Recommendations for Other Services       Precautions / Restrictions Precautions Precautions: Fall Restrictions Weight Bearing Restrictions: No     Mobility  Bed Mobility Overal bed mobility: Independent                  Transfers Overall transfer level: Modified independent Equipment used: Rolling walker (2 wheels)               General transfer comment: uses momentum to power up to standing    Ambulation/Gait Ambulation/Gait assistance: Supervision, Modified independent (Device/Increase time) Gait Distance (Feet): 120 Feet Assistive device: Rolling walker (2 wheels) Gait Pattern/deviations: Step-through pattern, Trunk flexed Gait velocity: decreased     General Gait Details: labored cadence with use of RW, required 4 standing rest breaks for fatigue   Stairs             Wheelchair Mobility    Modified Rankin (Stroke Patients Only)  Balance                                            Cognition Arousal/Alertness: Awake/alert Behavior During Therapy: WFL for tasks assessed/performed Overall Cognitive Status: Within Functional Limits for tasks assessed                                          Exercises      General Comments        Pertinent Vitals/Pain Pain Assessment Pain Assessment: No/denies pain (Reports he has increased LBP following standing or  gait, no reports of pain currently)    Home Living                          Prior Function            PT Goals (current goals can now be found in the care plan section)      Frequency    Min 3X/week      PT Plan      Co-evaluation              AM-PAC PT "6 Clicks" Mobility   Outcome Measure  Help needed turning from your back to your side while in a flat bed without using bedrails?: None Help needed moving from lying on your back to sitting on the side of a flat bed without using bedrails?: None Help needed moving to and from a bed to a chair (including a wheelchair)?: None Help needed standing up from a chair using your arms (e.g., wheelchair or bedside chair)?: None Help needed to walk in hospital room?: None Help needed climbing 3-5 steps with a railing? : A Little 6 Click Score: 23    End of Session Equipment Utilized During Treatment: Oxygen;Gait belt Activity Tolerance: Patient tolerated treatment well Patient left: in chair;with call bell/phone within reach Nurse Communication: Mobility status PT Visit Diagnosis: Unsteadiness on feet (R26.81);Other abnormalities of gait and mobility (R26.89);Muscle weakness (generalized) (M62.81)     Time: 2122-4825 PT Time Calculation (min) (ACUTE ONLY): 25 min  Charges:  $Therapeutic Activity: 23-37 mins                    Ihor Austin, LPTA/CLT; CBIS [phone]  Aldona Lento 06/10/2022, 9:42 AM

## 2022-06-10 NOTE — Progress Notes (Addendum)
Progress Note  Patient Name: Colton Jackson. Date of Encounter: 06/10/2022  [facility] Cardiologist: Rozann Lesches, MD  AHF: Dr. Aundra Dubin (last visit in 12/2018)  Subjective   O2 requirement is back to his baseline of 5-6L. No chest pain or palpitations. Continues to have several BM's and Surgery has now signed off.   Inpatient Medications    Scheduled Meds:  bisacodyl  10 mg Rectal Once   bisacodyl  10 mg Rectal BID   Chlorhexidine Gluconate Cloth  6 each Topical Daily   cromolyn  1 spray Each Nare TID   famotidine  20 mg Oral BID   furosemide  40 mg Oral Daily   heparin  5,000 Units Subcutaneous Q8H   insulin aspart  0-15 Units Subcutaneous TID WC   insulin aspart  0-5 Units Subcutaneous QHS   insulin detemir  10 Units Subcutaneous BID   losartan  50 mg Oral Daily   metoprolol succinate  50 mg Oral Daily   potassium chloride  40 mEq Oral Once   sennosides  10 mL Oral BID   Continuous Infusions:  PRN Meds: acetaminophen **OR** acetaminophen, hydrALAZINE, levalbuterol, menthol-cetylpyridinium, metoprolol tartrate, morphine injection, ondansetron **OR** ondansetron (ZOFRAN) IV, oxyCODONE, phenol   Vital Signs    Vitals:   06/09/22 0458 06/09/22 1424 06/09/22 2112 06/10/22 0448  BP: (!) 145/66 128/74 140/67 (!) 144/71  Pulse: 86 82 86 77  Resp: (!) _0 Temp: 98 F (36.7 C) 98.1 F (36.7 C) (!) 97.4 F (36.3 C) 97.8 F (36.6 C)  TempSrc: Oral  Oral Oral  SpO2: 93% 97% 93% 99%  Weight:      Height:        Intake/Output Summary (Last 24 hours) at 06/10/2022 0904 Last data filed at 06/10/2022 0827 Gross per 24 hour  Intake 1436 ml  Output --  Net 1436 ml      06/08/2022    3:00 AM 06/07/2022    4:00 AM 06/04/2022    6:14 AM  Last 3 Weights  Weight (lbs) 274 lb 0.5 oz 278 lb 7.1 oz 283 lb 11.7 oz  Weight (kg) 124.3 kg 126.3 kg 128.7 kg      Telemetry    NSR, HR in 70's to 80's. 6 beats NSVT. - Personally Reviewed  ECG    No new  tracings.   Physical Exam   GEN: Pleasant obese male appearing in no acute distress.   Neck: No JVD Cardiac: RRR, no murmurs, rubs, or gallops.  Respiratory: Clear to auscultation bilaterally without wheezing or rales. GI: Soft, nontender, non-distended  MS: No pitting edema; No deformity. Neuro:  Nonfocal  Psych: Normal affect   Labs    High Sensitivity Troponin:   Recent Labs  Lab 06/06/22 0205  TROPONINIHS 61*     Chemistry Recent Labs  Lab 06/04/22 0432 06/05/22 0647 06/06/22 0205 06/07/22 0416 06/08/22 0501 06/09/22 0519 06/10/22 0541  NA 141   < > 144 142 142 139 137  K 3.6   < > 3.0* 3.4* 3.2* 3.0* 3.2*  CL 95*   < > 100 99 101 98 95*  CO2 32   < > 32 31 29 32 32  GLUCOSE 191*   < > 239* 156* 131* 153* 188*  BUN 19   < > 23 31* 26* 15 11  CREATININE 1.23   < > 1.53* 1.88* 1.56* 1.23 1.11  CALCIUM 8.4*   < > 7.5* 7.7* 7.9* 8.2* 8.4*  MG 1.4*  --   --   --   --   --   --   PROT 7.6   < > 7.7 7.0 7.0  --   --   ALBUMIN 3.8   < > 3.5 3.2* 3.2*  --   --   AST 24   < > 19 14* 23  --   --   ALT 15   < > _0 --   --   ALKPHOS 50   < > 59 54 57  --   --   BILITOT 0.6   < > 1.3* 1.2 1.4*  --   --   GFRNONAA >60   < > 49* 38* 48* >60 >60  ANIONGAP 14   < > _1 < > = values in this interval not displayed.    Lipids No results for input(s): "CHOL", "TRIG", "HDL", "LABVLDL", "LDLCALC", "CHOLHDL" in the last 168 hours.  Hematology Recent Labs  Lab 06/07/22 0416 06/08/22 0500 06/09/22 0519  WBC 9.6 11.1* 7.9  RBC 3.91* 3.82* 3.57*  HGB 12.4* 11.9* 11.2*  HCT 38.4* 37.3* 34.4*  MCV 98.2 97.6 96.4  MCH 31.7 31.2 31.4  MCHC 32.3 31.9 32.6  RDW 13.3 13.2 12.8  PLT 267 314 280   Thyroid  Recent Labs  Lab 06/04/22 0432  TSH 0.855    BNP Recent Labs  Lab 06/05/22 0647 06/06/22 0727  BNP 291.0* 177.0*    DDimer No results for input(s): "DDIMER" in the last 168 hours.   Radiology     DG Abd Portable 1V-Small Bowel Obstruction  Protocol-24 hr delay  Result Date: 06/08/2022 CLINICAL DATA:  Small bowel obstruction EXAM: PORTABLE ABDOMEN - 1 VIEW COMPARISON:  06/07/2022 FINDINGS: Oral contrast material opacifies the distal small bowel and colon to the level of the rectum. No bowel dilatation to suggest obstruction. No evidence of pneumoperitoneum, portal venous gas or pneumatosis. No pathologic calcifications along the expected course of the ureters. No acute osseous abnormality. IMPRESSION: 1. No evidence of bowel obstruction. Electronically Signed   By: Kathreen Devoid M.D.   On: 06/08/2022 10:54    Cardiac Studies   Echocardiogram: 06/08/2022 IMPRESSIONS     1. Left ventricular ejection fraction, by estimation, is 70 to 75%. The  left ventricle has hyperdynamic function. The left ventricle has no  regional wall motion abnormalities. There is moderate left ventricular  hypertrophy. Left ventricular diastolic  parameters are consistent with Grade I diastolic dysfunction (impaired  relaxation). There is the interventricular septum is flattened in systole  and diastole, consistent with right ventricular pressure and volume  overload.   2. Right ventricular systolic function is normal. The right ventricular  size is mildly enlarged. There is severely elevated pulmonary artery  systolic pressure. The estimated right ventricular systolic pressure is  53.2 mmHg.   3. Right atrial size was mildly dilated.   4. The mitral valve is grossly normal. Trivial mitral valve  regurgitation.   5. The aortic valve is tricuspid. Aortic valve regurgitation is not  visualized.   6. The inferior vena cava is dilated in size with >50% respiratory  variability, suggesting right atrial pressure of 8 mmHg.   Comparison(s): Prior images unable to be directly viewed.   Patient Profile     71 y.o. male w/ PMH of pulmonary HTN (felt to be Group 3), HTN, HLD, Type II DM, chronic hypoxic respiratory failure (on 5 L nasal cannula  at baseline)  and OSA who is currently admitted for a SBO. Cardiology consulted due to fluid overload.   Assessment & Plan    1. Acute Hypoxic Respiratory Failure in the setting of Volume Overload/Pulmonary HTN/Chronic Hypoxic Respiratory Failure - He has known Group 3 Pulmonary HTN and has been followed by Pulmonology as an outpatient. Repeat echo this admission shows his PASP is severely elevated to 77.2 mmHg. He was previously followed by Dr. Aundra Dubin but lost to follow-up and it has been recommended for him to reestablish care with plans for possible RHC and titration of medical therapy. Will send a message to the AHF office to arrange follow-up.  - He has responded well to IV Lasix with a net output of at least 10 L but likely more as strict output was not recorded yesterday but listed as having 5 urine occurrences. He has been switched back to Lasix 13m daily which he was on PTA and creatinine continues to improve (at 1.11 today).  2. CAD/Elevated Troponin  - He had nonobstructive CAD by cath in 2019 and Hs Troponin values were elevated to 61 this admission but felt to be secondary to demand ischemia in the setting of hypoxic respiratory failure. Echo shows a preserved EF with no regional WMA. No plans for further ischemic testing at this time.   3. Preoperative Cardiac Clearance for Exploratory Laparotomy - Was felt to be moderate to high-risk for surgery given his respiratory status. He is now having frequent BM's and has not required surgical intervention.    4. HTN - BP has been stable at 128/74 - 144/71 in the past 24 hours. Continue Toprol-XL and will titrate Losartan to 568mdaily (was on 10057maily prior to admission). Can likely restart Amlodipine at discharge or as an outpatient pending BP trend.   5. AKI/Hypokalemia - Creatinine peaked at 1.88, improved to 1.11 today. K+ low at 3.2 and will order replacement.   For questions or updates, please contact [facility] consult  [email or web address] for contact info under        Signed, BriErma HeritageA-C  06/10/2022, 9:04 AM    Patient examined chart reviewed taking PO and no abdominal pain Breathing at baseline Exam with COPD exp wheezing edema improved WHO 3 pulmonary HTN ok to d/c home f/u [facility] consider starting revatio Continue oral lasix F/U GI but no surgery needed at this time   PetJenkins Rouge FAC[facility]oland [facility], [facility]

## 2022-06-10 NOTE — Plan of Care (Signed)

## 2022-06-11 ENCOUNTER — Other Ambulatory Visit (INDEPENDENT_AMBULATORY_CARE_PROVIDER_SITE_OTHER): Payer: Self-pay | Admitting: Ophthalmology

## 2022-06-11 ENCOUNTER — Ambulatory Visit: Payer: Medicare Other | Admitting: Physician Assistant

## 2022-06-15 DIAGNOSIS — Z9181 History of falling: Secondary | ICD-10-CM | POA: Diagnosis not present

## 2022-06-15 DIAGNOSIS — I272 Pulmonary hypertension, unspecified: Secondary | ICD-10-CM | POA: Diagnosis not present

## 2022-06-15 DIAGNOSIS — Z6841 Body Mass Index (BMI) 40.0 and over, adult: Secondary | ICD-10-CM | POA: Diagnosis not present

## 2022-06-15 DIAGNOSIS — J9621 Acute and chronic respiratory failure with hypoxia: Secondary | ICD-10-CM | POA: Diagnosis not present

## 2022-06-15 DIAGNOSIS — K5900 Constipation, unspecified: Secondary | ICD-10-CM | POA: Diagnosis not present

## 2022-06-15 DIAGNOSIS — Z7984 Long term (current) use of oral hypoglycemic drugs: Secondary | ICD-10-CM | POA: Diagnosis not present

## 2022-06-15 DIAGNOSIS — M519 Unspecified thoracic, thoracolumbar and lumbosacral intervertebral disc disorder: Secondary | ICD-10-CM | POA: Diagnosis not present

## 2022-06-15 DIAGNOSIS — G4733 Obstructive sleep apnea (adult) (pediatric): Secondary | ICD-10-CM | POA: Diagnosis not present

## 2022-06-15 DIAGNOSIS — I110 Hypertensive heart disease with heart failure: Secondary | ICD-10-CM | POA: Diagnosis not present

## 2022-06-15 DIAGNOSIS — Z8719 Personal history of other diseases of the digestive system: Secondary | ICD-10-CM | POA: Diagnosis not present

## 2022-06-15 DIAGNOSIS — D72829 Elevated white blood cell count, unspecified: Secondary | ICD-10-CM | POA: Diagnosis not present

## 2022-06-15 DIAGNOSIS — I509 Heart failure, unspecified: Secondary | ICD-10-CM | POA: Diagnosis not present

## 2022-06-15 DIAGNOSIS — Z9981 Dependence on supplemental oxygen: Secondary | ICD-10-CM | POA: Diagnosis not present

## 2022-06-15 DIAGNOSIS — E876 Hypokalemia: Secondary | ICD-10-CM | POA: Diagnosis not present

## 2022-06-15 DIAGNOSIS — Z87891 Personal history of nicotine dependence: Secondary | ICD-10-CM | POA: Diagnosis not present

## 2022-06-15 DIAGNOSIS — E662 Morbid (severe) obesity with alveolar hypoventilation: Secondary | ICD-10-CM | POA: Diagnosis not present

## 2022-06-15 DIAGNOSIS — E041 Nontoxic single thyroid nodule: Secondary | ICD-10-CM | POA: Diagnosis not present

## 2022-06-15 DIAGNOSIS — K219 Gastro-esophageal reflux disease without esophagitis: Secondary | ICD-10-CM | POA: Diagnosis not present

## 2022-06-15 DIAGNOSIS — E119 Type 2 diabetes mellitus without complications: Secondary | ICD-10-CM | POA: Diagnosis not present

## 2022-06-15 DIAGNOSIS — N179 Acute kidney failure, unspecified: Secondary | ICD-10-CM | POA: Diagnosis not present

## 2022-06-15 DIAGNOSIS — Z791 Long term (current) use of non-steroidal anti-inflammatories (NSAID): Secondary | ICD-10-CM | POA: Diagnosis not present

## 2022-06-15 DIAGNOSIS — I720 Aneurysm of carotid artery: Secondary | ICD-10-CM | POA: Diagnosis not present

## 2022-06-17 DIAGNOSIS — Z6841 Body Mass Index (BMI) 40.0 and over, adult: Secondary | ICD-10-CM | POA: Diagnosis not present

## 2022-06-17 DIAGNOSIS — K56609 Unspecified intestinal obstruction, unspecified as to partial versus complete obstruction: Secondary | ICD-10-CM | POA: Diagnosis not present

## 2022-06-17 DIAGNOSIS — J449 Chronic obstructive pulmonary disease, unspecified: Secondary | ICD-10-CM | POA: Diagnosis not present

## 2022-06-17 DIAGNOSIS — E109 Type 1 diabetes mellitus without complications: Secondary | ICD-10-CM | POA: Diagnosis not present

## 2022-06-17 DIAGNOSIS — G8929 Other chronic pain: Secondary | ICD-10-CM | POA: Diagnosis not present

## 2022-06-19 DIAGNOSIS — J9621 Acute and chronic respiratory failure with hypoxia: Secondary | ICD-10-CM | POA: Diagnosis not present

## 2022-06-19 DIAGNOSIS — E119 Type 2 diabetes mellitus without complications: Secondary | ICD-10-CM | POA: Diagnosis not present

## 2022-06-19 DIAGNOSIS — I509 Heart failure, unspecified: Secondary | ICD-10-CM | POA: Diagnosis not present

## 2022-06-19 DIAGNOSIS — I110 Hypertensive heart disease with heart failure: Secondary | ICD-10-CM | POA: Diagnosis not present

## 2022-06-19 DIAGNOSIS — N179 Acute kidney failure, unspecified: Secondary | ICD-10-CM | POA: Diagnosis not present

## 2022-06-19 DIAGNOSIS — I272 Pulmonary hypertension, unspecified: Secondary | ICD-10-CM | POA: Diagnosis not present

## 2022-06-23 DIAGNOSIS — J9621 Acute and chronic respiratory failure with hypoxia: Secondary | ICD-10-CM | POA: Diagnosis not present

## 2022-06-23 DIAGNOSIS — I110 Hypertensive heart disease with heart failure: Secondary | ICD-10-CM | POA: Diagnosis not present

## 2022-06-23 DIAGNOSIS — N179 Acute kidney failure, unspecified: Secondary | ICD-10-CM | POA: Diagnosis not present

## 2022-06-23 DIAGNOSIS — E119 Type 2 diabetes mellitus without complications: Secondary | ICD-10-CM | POA: Diagnosis not present

## 2022-06-23 DIAGNOSIS — I509 Heart failure, unspecified: Secondary | ICD-10-CM | POA: Diagnosis not present

## 2022-06-23 DIAGNOSIS — I272 Pulmonary hypertension, unspecified: Secondary | ICD-10-CM | POA: Diagnosis not present

## 2022-06-26 DIAGNOSIS — N179 Acute kidney failure, unspecified: Secondary | ICD-10-CM | POA: Diagnosis not present

## 2022-06-26 DIAGNOSIS — E119 Type 2 diabetes mellitus without complications: Secondary | ICD-10-CM | POA: Diagnosis not present

## 2022-06-26 DIAGNOSIS — J9621 Acute and chronic respiratory failure with hypoxia: Secondary | ICD-10-CM | POA: Diagnosis not present

## 2022-06-26 DIAGNOSIS — I509 Heart failure, unspecified: Secondary | ICD-10-CM | POA: Diagnosis not present

## 2022-06-26 DIAGNOSIS — I110 Hypertensive heart disease with heart failure: Secondary | ICD-10-CM | POA: Diagnosis not present

## 2022-06-26 DIAGNOSIS — I272 Pulmonary hypertension, unspecified: Secondary | ICD-10-CM | POA: Diagnosis not present

## 2022-06-29 DIAGNOSIS — I509 Heart failure, unspecified: Secondary | ICD-10-CM | POA: Diagnosis not present

## 2022-06-29 DIAGNOSIS — I110 Hypertensive heart disease with heart failure: Secondary | ICD-10-CM | POA: Diagnosis not present

## 2022-06-29 DIAGNOSIS — J9621 Acute and chronic respiratory failure with hypoxia: Secondary | ICD-10-CM | POA: Diagnosis not present

## 2022-06-29 DIAGNOSIS — I272 Pulmonary hypertension, unspecified: Secondary | ICD-10-CM | POA: Diagnosis not present

## 2022-07-03 ENCOUNTER — Ambulatory Visit (HOSPITAL_COMMUNITY)
Admission: RE | Admit: 2022-07-03 | Discharge: 2022-07-03 | Disposition: A | Discharge status: Home / Self Care | Payer: Medicare Other | Source: Ambulatory Visit | Attending: Family Medicine | Admitting: Family Medicine

## 2022-07-03 ENCOUNTER — Encounter (HOSPITAL_COMMUNITY): Payer: Self-pay

## 2022-07-03 ENCOUNTER — Other Ambulatory Visit (HOSPITAL_COMMUNITY): Payer: Self-pay | Admitting: *Deleted

## 2022-07-03 VITALS — BP 166/82 | HR 87 | Wt 277.5 lb

## 2022-07-03 DIAGNOSIS — Z9989 Dependence on other enabling machines and devices: Secondary | ICD-10-CM | POA: Diagnosis not present

## 2022-07-03 DIAGNOSIS — R0602 Shortness of breath: Secondary | ICD-10-CM | POA: Insufficient documentation

## 2022-07-03 DIAGNOSIS — I272 Pulmonary hypertension, unspecified: Secondary | ICD-10-CM

## 2022-07-03 DIAGNOSIS — I10 Essential (primary) hypertension: Secondary | ICD-10-CM | POA: Diagnosis not present

## 2022-07-03 DIAGNOSIS — Z79899 Other long term (current) drug therapy: Secondary | ICD-10-CM | POA: Insufficient documentation

## 2022-07-03 DIAGNOSIS — R5383 Other fatigue: Secondary | ICD-10-CM | POA: Diagnosis not present

## 2022-07-03 DIAGNOSIS — Z8719 Personal history of other diseases of the digestive system: Secondary | ICD-10-CM | POA: Diagnosis not present

## 2022-07-03 DIAGNOSIS — Z7984 Long term (current) use of oral hypoglycemic drugs: Secondary | ICD-10-CM | POA: Insufficient documentation

## 2022-07-03 DIAGNOSIS — E669 Obesity, unspecified: Secondary | ICD-10-CM | POA: Diagnosis not present

## 2022-07-03 DIAGNOSIS — Z9981 Dependence on supplemental oxygen: Secondary | ICD-10-CM | POA: Diagnosis not present

## 2022-07-03 DIAGNOSIS — Z6841 Body Mass Index (BMI) 40.0 and over, adult: Secondary | ICD-10-CM | POA: Diagnosis not present

## 2022-07-03 DIAGNOSIS — J449 Chronic obstructive pulmonary disease, unspecified: Secondary | ICD-10-CM | POA: Diagnosis not present

## 2022-07-03 DIAGNOSIS — G4733 Obstructive sleep apnea (adult) (pediatric): Secondary | ICD-10-CM | POA: Diagnosis not present

## 2022-07-03 DIAGNOSIS — Z7982 Long term (current) use of aspirin: Secondary | ICD-10-CM | POA: Insufficient documentation

## 2022-07-03 DIAGNOSIS — E119 Type 2 diabetes mellitus without complications: Secondary | ICD-10-CM | POA: Diagnosis not present

## 2022-07-03 DIAGNOSIS — I2510 Atherosclerotic heart disease of native coronary artery without angina pectoris: Secondary | ICD-10-CM | POA: Diagnosis not present

## 2022-07-03 DIAGNOSIS — R0609 Other forms of dyspnea: Secondary | ICD-10-CM | POA: Insufficient documentation

## 2022-07-03 LAB — BASIC METABOLIC PANEL
Anion gap: 6 (ref 5–15)
BUN: 15 mg/dL (ref 8–23)
CO2: 28 mmol/L (ref 22–32)
Calcium: 9.3 mg/dL (ref 8.9–10.3)
Chloride: 106 mmol/L (ref 98–111)
Creatinine, Ser: 1.12 mg/dL (ref 0.61–1.24)
GFR, Estimated: >60 mL/min (ref >60)
Glucose, Bld: 148 mg/dL — ABNORMAL HIGH (ref 70–99)
Potassium: 3.8 mmol/L (ref 3.5–5.1)
Sodium: 140 mmol/L (ref 135–145)

## 2022-07-03 LAB — CBC
HCT: 32.2 % — ABNORMAL LOW (ref 39.0–52.0)
Hemoglobin: 10.9 g/dL — ABNORMAL LOW (ref 13.0–17.0)
MCH: 32.2 pg (ref 26.0–34.0)
MCHC: 33.9 g/dL (ref 30.0–36.0)
MCV: 95.3 fL (ref 80.0–100.0)
Platelets: 295 10*3/uL (ref 150–400)
RBC: 3.38 MIL/uL — ABNORMAL LOW (ref 4.22–5.81)
RDW: 13.6 % (ref 11.5–15.5)
WBC: 6.7 10*3/uL (ref 4.0–10.5)
nRBC: 0 % (ref 0.0–0.2)

## 2022-07-03 LAB — BRAIN NATRIURETIC PEPTIDE: B Natriuretic Peptide: 41.5 pg/mL (ref 0.0–100.0)

## 2022-07-03 MED ORDER — SPIRONOLACTONE 25 MG PO TABS: 12.5000 mg | ORAL_TABLET | Freq: Every day | ORAL | 3 refills | Status: DC

## 2022-07-03 NOTE — Patient Instructions (Signed)
START Spironolactone 12.5 mg (1/2 tab) Daily  Take extra Furosemide 40 mg FOR 3 DAYS ONLY, then back to 40 mg Daily  Labs done today, we will call you for abnormal results  Heart Catheterization 8/28, see instructions below  Your physician has requested that you regularly monitor and record your blood pressure readings at home. Please use the same machine at the same time of day to check your readings and record them to bring to your follow-up visit.  Your physician recommends that you schedule a follow-up appointment in: 4-6 weeks  At the [facility], you and your health needs are our priority. As part of our continuing mission to provide you with exceptional heart care, we have created designated Provider Care Teams. These Care Teams include your primary Cardiologist (physician) and Advanced Practice Providers (APPs- Physician Assistants and Nurse Practitioners) who all work together to provide you with the care you need, when you need it.   You may see any of the following providers on your designated Care Team at your next follow up: Dr Glori Bickers Dr Haynes Kerns, NP Lyda Jester, [place] [facility] [place], [place] Audry Riles, PharmD   Please be sure to bring in all your medications bottles to every appointment.   If you have any questions or concerns before your next appointment please send [place] a message through [place] or call our office at [phone].    TO LEAVE A MESSAGE FOR THE NURSE SELECT OPTION 2, PLEASE LEAVE A MESSAGE INCLUDING: YOUR NAME DATE OF BIRTH CALL BACK NUMBER REASON FOR CALL**this is important as we prioritize the call backs  YOU WILL RECEIVE A CALL BACK THE SAME DAY AS LONG AS YOU CALL BEFORE 4:00 PM    Catheterization Instructions:  You are scheduled for a Cardiac Catheterization on Monday, August 28 with Dr. Loralie Champagne.  1. Please arrive at the Main Entrance A at [facility]: [place], [place] [address] at 8:30 AM (This time is two hours before your procedure to ensure your preparation). Free valet parking service is available.   Special note: Every effort is made to have your procedure done on time. Please understand that emergencies sometimes delay scheduled procedures.  2. Diet: Do not eat solid foods after midnight.  You may have clear liquids until 5 AM upon the day of the procedure.  3. Labs: done today  4. Medication instructions in preparation for your procedure:   Monday 8/28 AM: DO NOT TAKE: -Furosemide,  -Spironolactone, -Glipizide,  -Metformin  On the morning of your procedure, take any morning medicines NOT listed above.  You may use sips of water.  5. Plan to go home the same day, you will only stay overnight if medically necessary. 6. You MUST have a responsible adult to drive you home. 7. An adult MUST be with you the first 24 hours after you arrive home. 8. Bring a current list of your medications, and the last time and date medication taken. 9. Bring ID and current insurance cards. 10.Please wear clothes that are easy to get on and off and wear slip-on shoes.  Thank you for allowing [place] to care for you!   -- [facility]

## 2022-07-03 NOTE — Progress Notes (Signed)
PCP: Dr. Hilma Favors Cardiology: Dr. Domenic Polite HF Cardiology: Dr. Aundra Dubin  71 y.o. with history of OHS/OSA, chronic bronchitis, and suspected pulmonary hypertension was referred by Dr. Domenic Polite for evaluation of pulmonary hypertension. Patient is on 2L home oxygen and CPAP. He has had gradually worsening exertional dyspnea over the last year.  He was able to mow his grass last fall but cannot now.  He is short of breath after walking about 50-60 feet with his oxygen on.  He is short of breath with any incline.   Echo in 3/19 showed normal EF with PASP 72 mmHg.  I had him do left/right heart cath in 8/19.  This showed no CAD and moderate mixed pulmonary venous/pulmonary arterial hypertension.  Very mildly elevated filling pressures.    He is followed by pulmonary.  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 V/Q scan in 9/19 did not show evidence for chronic PE.   Last seen 12/2018.  Admitted 7/23 with SBO, treated medically. Has a/c respiratory failure and cardiology consulted. Echo showed EF 70-75%, hyperdynamic EF with moderate LVH, RV normal systolic function, severely elevated pulmonary artery systolic pressures 56.3 mm Hg. Discharged home on Lasix 40 mg, weight 273 lbs. Advised HF follow up to discuss RHC.  Today he returns for post-hospitalization HF follow up. He has been lost to follow up since 12/2018. Overall feeling fatigued. Has SOB with ADLs and minimal activity, but able to get around house if he takes his time. Wears 5L oxygen during the day  up from 4L day), not wearing CPAP at night as he cannot tolerate the straps on the mask. Denies palpitations, CP, dizziness, edema, or PND/Orthopnea. Appetite ok. No fever or chills. Weight at home 277 pounds. Taking all medications. Wears compression hose.  ReDs: 41%  ECG (personally reviewed): NSR 78 bpm (personally reviewed).  Labs (11/18): BNP 21 Labs (7/19): LDL  79 Labs (8/19): K 3.9, creatinine 0.73 Labs (9/19): ACE normal, K 3.8, creatinine 1.13 Labs (7/23): K 3.2, creatinine 1.11  PMH: 1. OHS/OSA: Uses 5L home oxygen and CPAP.  2. Obesity 3. HTN 4. Type II diabetes 5. Chronic bronchitis: ?from welding.  Never smoked.    - CT chest (3/19): coronary calcification, mild emphysema, calcified mediastinal and hilar nodes consistent with prior granulomatous infection.  - PFTs (12/17): FVC 56%, FEV1 57%, ratio 101%, SVC 109%, DLCO 66%.  Moderate obstruction/severe restriction.  6. CAD: Coronary calcification on CT chest 3/19.   - Cardiolite in 2012 showed no ischemia.  - LHC (8/19): No significant CAD.  7. Pulmonary hypertension: Echo (3/19) with EF 65-70%, moderate LVH, PASP 72 mmHg.  - RHC (8/19): mean RA 8, PA 67/24 mean 43, mean PCWP 17, CI 2.99, PVR 3.5 WU.  - V/Q scan (9/19): No evidence for chronic PE.  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   Family History  Problem Relation Age of Onset   Lung disease Father    Stroke Father 52   Diabetes Father    Diabetes Maternal Grandfather    Heart disease Paternal Grandmother    Stroke Paternal Grandfather 59   Diabetes Paternal Grandfather    Diabetes Mother    Cancer Mother    Stroke Mother 55   Diabetes Sister    Diabetes Brother 65  ROS: All systems reviewed and negative except as per HPI.   Current Outpatient Medications  Medication Sig Dispense Refill   acetaminophen (TYLENOL) 500 MG tablet Take 500 mg by mouth 3 (three) times daily.     Cholecalciferol (VITAMIN D3) 10 MCG (400 UNIT) CAPS Take 1 capsule by mouth daily.     cromolyn (NASALCROM) 5.2 MG/ACT nasal spray Place 1 spray into both nostrils 3 (three) times daily as needed for allergies.      famotidine (PEPCID) 20 MG tablet Take 20 mg by mouth 2 (two) times daily.       furosemide (LASIX) 40 MG tablet Take 40 mg by mouth daily.     glipiZIDE (GLUCOTROL) 10 MG tablet Take 10 mg by mouth 2 (two) times daily.      HYDROcodone-acetaminophen (NORCO) 10-325 MG tablet Take 1 tablet by mouth 3 (three) times daily as needed for moderate pain.      Krill Oil 500 MG CAPS Take 500 mg by mouth daily.      latanoprost (XALATAN) 0.005 % ophthalmic solution INSTILL 1 DROP IN BOTH EYES EVERY DAY AT BEDTIME 7.5 mL 12   losartan (COZAAR) 50 MG tablet Take 1 tablet (50 mg total) by mouth daily. 30 tablet 1   meclizine (ANTIVERT) 25 MG tablet Take 25 mg by mouth 3 (three) times daily as needed for dizziness or nausea (when taking hydrocodone/apap).      meloxicam (MOBIC) 7.5 MG tablet Take 7.5 mg by mouth 2 (two) times daily.     metFORMIN (GLUCOPHAGE-XR) 500 MG 24 hr tablet Take 1,000 mg by mouth 2 (two) times daily.     metoprolol succinate (TOPROL-XL) 50 MG 24 hr tablet Take 1 tablet (50 mg total) by mouth daily. 30 tablet 3   mupirocin ointment (BACTROBAN) 2 % 1 application 2 (two) times daily.     OXYGEN Inhale 5 L/hr into the lungs daily.     potassium chloride (KLOR-CON M) 10 MEQ tablet Take 1 tablet (10 mEq total) by mouth daily. 15 tablet 0   psyllium (METAMUCIL) 58.6 % powder Take 1 packet by mouth 2 (two) times daily.      trospium (SANCTURA) 20 MG tablet Take 1 tablet (20 mg total) by mouth 2 (two) times daily. 180 tablet 3   No current facility-administered medications for this encounter.   Wt Readings from Last 3 Encounters:  07/03/22 125.9 kg (277 lb 8 oz)  06/08/22 124.3 kg (274 lb 0.5 oz)  01/22/22 131.5 kg (290 lb)   BP (!) 166/82   Pulse 87   Wt 125.9 kg (277 lb 8 oz)   SpO2 96% Comment: 2L O2  BMI 43.46 kg/m  Physical Exam: General:  NAD. No resp difficulty, arrived in [facility] on 2L oxygen HEENT: Normal Neck: Supple. + v waves, JVD to jaw. Carotids 2+ bilat; no bruits. No lymphadenopathy or thryomegaly appreciated. Cor: PMI nondisplaced. Regular rate &  rhythm. No rubs, gallops or murmurs. Lungs: Clear Abdomen: Obese, nontender, nondistended. No hepatosplenomegaly. No bruits or masses. Good bowel sounds. Extremities: No cyanosis, clubbing, rash, edema Neuro: Alert & oriented x 3, cranial nerves grossly intact. Moves all 4 extremities w/o difficulty. Affect pleasant.  Assessment/Plan: 1. Pulmonary hypertension: Echo in 3/19 showed estimated PA systolic pressure 72.  He has chronic bronchitis as well as OHS/OSA.  Interestingly, chest CT showed calcified mediastinal and hilar nodes suggestive of prior granulomatous infection => could this actually represent sarcoidosis?  ACE level is normal, and per his pulmonologist  [name], sarcoidosis is unlikely. [place] 8/19 showed mild pulmonary hypertension, mildly elevated PCWP.  Suspect there is group 2 PH from mildly elevated PCWP but also group 3 PH from OHS/OSA and parenchymal lung disease.  Unlikely group 1.  PVR only 3.5 WU. Echo (7/23) EF 70-75%, hyperdynamic EF with moderate LVH, RV normal systolic function, severely elevated pulmonary artery systolic pressures 37.0 mm Hg.  Patient has NYHA class III dyspnea, functional status confounded by physical deconditioning and recent hospitalization.  He is volume overloaded today, REDs 41%.  - Start spiro 12.5 mg daily. BMET, BNP today, repeat BMET in 1 week. - Increase Lasix to 40 mg bid x 3 days, then back to Lasix 40 mg daily. - No SGLT2i with incontinence issues. Followed by Urology. - Defer 6 MW today with volume overload. - We discussed repeating RHC to better assess right sided pressures and evaluate need for pulmonary vasodilators. Risks/benefits discussed with patient and he is agreeable. Discussed with Dr. Aundra Dubin, will arrange. 2. CAD: Coronary calcification on 3/19 chest CT.  Patient has a history of chest pain.  LHC (8/19) showed no significant coronary disease. No recent chest pain.  - Continue ASA 81.  - LDL 7/19. Check lipids next visit.  3. HTN:  Elevated. Diurese as above. - Start spiro 12.5 mg daily. - Given Rx for BP cuff and asked to check daily and log. - Can add amlodipine back if BP remains elevated. 4. OHS/OSA: Continue oxygen 5L during day, unable to tolerate CPAP. Advised discussing CPAP with [name] at his follow up next month. 5. DM2: A1c 6.9 (7/23) 6. Obesity: Body mass index is 43.46 kg/m. - Discussed weight loss. - Consider referral to pharmacy for semaglutide. Will wait until he is farther out from hospitalization. 7. SBO: resolved w/ medical management. Tolerating regular diet and having BMs.  Arrange RHC with Dr. Aundra Dubin, follow up with APP 2-3 weeks after.  Maricela Bo [facility] FNP-BC 07/03/2022

## 2022-07-03 NOTE — Progress Notes (Signed)
ReDS Vest / Clip - 07/03/22 1300       ReDS Vest / Clip   Station Marker D    Ruler Value 33    ReDS Value Range Low volume    ReDS Actual Value 41              Zada Girt RN

## 2022-07-03 NOTE — H&P (View-Only) (Signed)
PCP: Dr. Hilma Favors Cardiology: Dr. Domenic Polite HF Cardiology: Dr. Aundra Dubin  71 y.o. with history of OHS/OSA, chronic bronchitis, and suspected pulmonary hypertension was referred by Dr. Domenic Polite for evaluation of pulmonary hypertension. Patient is on 2L home oxygen and CPAP. He has had gradually worsening exertional dyspnea over the last year.  He was able to mow his grass last fall but cannot now.  He is short of breath after walking about 50-60 feet with his oxygen on.  He is short of breath with any incline.   Echo in 3/19 showed normal EF with PASP 72 mmHg.  I had him do left/right heart cath in 8/19.  This showed no CAD and moderate mixed pulmonary venous/pulmonary arterial hypertension.  Very mildly elevated filling pressures.    He is followed by pulmonary.  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 V/Q scan in 9/19 did not show evidence for chronic PE.   Last seen 12/2018.  Admitted 7/23 with SBO, treated medically. Has a/c respiratory failure and cardiology consulted. Echo showed EF 70-75%, hyperdynamic EF with moderate LVH, RV normal systolic function, severely elevated pulmonary artery systolic pressures 56.3 mm Hg. Discharged home on Lasix 40 mg, weight 273 lbs. Advised HF follow up to discuss RHC.  Today he returns for post-hospitalization HF follow up. He has been lost to follow up since 12/2018. Overall feeling fatigued. Has SOB with ADLs and minimal activity, but able to get around house if he takes his time. Wears 5L oxygen during the day  up from 4L day), not wearing CPAP at night as he cannot tolerate the straps on the mask. Denies palpitations, CP, dizziness, edema, or PND/Orthopnea. Appetite ok. No fever or chills. Weight at home 277 pounds. Taking all medications. Wears compression hose.  ReDs: 41%  ECG (personally reviewed): NSR 78 bpm (personally reviewed).  Labs (11/18): BNP 21 Labs (7/19): LDL  79 Labs (8/19): K 3.9, creatinine 0.73 Labs (9/19): ACE normal, K 3.8, creatinine 1.13 Labs (7/23): K 3.2, creatinine 1.11  PMH: 1. OHS/OSA: Uses 5L home oxygen and CPAP.  2. Obesity 3. HTN 4. Type II diabetes 5. Chronic bronchitis: ?from welding.  Never smoked.    - CT chest (3/19): coronary calcification, mild emphysema, calcified mediastinal and hilar nodes consistent with prior granulomatous infection.  - PFTs (12/17): FVC 56%, FEV1 57%, ratio 101%, SVC 109%, DLCO 66%.  Moderate obstruction/severe restriction.  6. CAD: Coronary calcification on CT chest 3/19.   - Cardiolite in 2012 showed no ischemia.  - LHC (8/19): No significant CAD.  7. Pulmonary hypertension: Echo (3/19) with EF 65-70%, moderate LVH, PASP 72 mmHg.  - RHC (8/19): mean RA 8, PA 67/24 mean 43, mean PCWP 17, CI 2.99, PVR 3.5 WU.  - V/Q scan (9/19): No evidence for chronic PE.  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   Family History  Problem Relation Age of Onset   Lung disease Father    Stroke Father 52   Diabetes Father    Diabetes Maternal Grandfather    Heart disease Paternal Grandmother    Stroke Paternal Grandfather 59   Diabetes Paternal Grandfather    Diabetes Mother    Cancer Mother    Stroke Mother 55   Diabetes Sister    Diabetes Brother 65  ROS: All systems reviewed and negative except as per HPI.   Current Outpatient Medications  Medication Sig Dispense Refill   acetaminophen (TYLENOL) 500 MG tablet Take 500 mg by mouth 3 (three) times daily.     Cholecalciferol (VITAMIN D3) 10 MCG (400 UNIT) CAPS Take 1 capsule by mouth daily.     cromolyn (NASALCROM) 5.2 MG/ACT nasal spray Place 1 spray into both nostrils 3 (three) times daily as needed for allergies.      famotidine (PEPCID) 20 MG tablet Take 20 mg by mouth 2 (two) times daily.       furosemide (LASIX) 40 MG tablet Take 40 mg by mouth daily.     glipiZIDE (GLUCOTROL) 10 MG tablet Take 10 mg by mouth 2 (two) times daily.      HYDROcodone-acetaminophen (NORCO) 10-325 MG tablet Take 1 tablet by mouth 3 (three) times daily as needed for moderate pain.      Krill Oil 500 MG CAPS Take 500 mg by mouth daily.      latanoprost (XALATAN) 0.005 % ophthalmic solution INSTILL 1 DROP IN BOTH EYES EVERY DAY AT BEDTIME 7.5 mL 12   losartan (COZAAR) 50 MG tablet Take 1 tablet (50 mg total) by mouth daily. 30 tablet 1   meclizine (ANTIVERT) 25 MG tablet Take 25 mg by mouth 3 (three) times daily as needed for dizziness or nausea (when taking hydrocodone/apap).      meloxicam (MOBIC) 7.5 MG tablet Take 7.5 mg by mouth 2 (two) times daily.     metFORMIN (GLUCOPHAGE-XR) 500 MG 24 hr tablet Take 1,000 mg by mouth 2 (two) times daily.     metoprolol succinate (TOPROL-XL) 50 MG 24 hr tablet Take 1 tablet (50 mg total) by mouth daily. 30 tablet 3   mupirocin ointment (BACTROBAN) 2 % 1 application 2 (two) times daily.     OXYGEN Inhale 5 L/hr into the lungs daily.     potassium chloride (KLOR-CON M) 10 MEQ tablet Take 1 tablet (10 mEq total) by mouth daily. 15 tablet 0   psyllium (METAMUCIL) 58.6 % powder Take 1 packet by mouth 2 (two) times daily.      trospium (SANCTURA) 20 MG tablet Take 1 tablet (20 mg total) by mouth 2 (two) times daily. 180 tablet 3   No current facility-administered medications for this encounter.   Wt Readings from Last 3 Encounters:  07/03/22 125.9 kg (277 lb 8 oz)  06/08/22 124.3 kg (274 lb 0.5 oz)  01/22/22 131.5 kg (290 lb)   BP (!) 166/82   Pulse 87   Wt 125.9 kg (277 lb 8 oz)   SpO2 96% Comment: 2L O2  BMI 43.46 kg/m  Physical Exam: General:  NAD. No resp difficulty, arrived in [facility] on 2L oxygen HEENT: Normal Neck: Supple. + v waves, JVD to jaw. Carotids 2+ bilat; no bruits. No lymphadenopathy or thryomegaly appreciated. Cor: PMI nondisplaced. Regular rate &  rhythm. No rubs, gallops or murmurs. Lungs: Clear Abdomen: Obese, nontender, nondistended. No hepatosplenomegaly. No bruits or masses. Good bowel sounds. Extremities: No cyanosis, clubbing, rash, edema Neuro: Alert & oriented x 3, cranial nerves grossly intact. Moves all 4 extremities w/o difficulty. Affect pleasant.  Assessment/Plan: 1. Pulmonary hypertension: Echo in 3/19 showed estimated PA systolic pressure 72.  He has chronic bronchitis as well as OHS/OSA.  Interestingly, chest CT showed calcified mediastinal and hilar nodes suggestive of prior granulomatous infection => could this actually represent sarcoidosis?  ACE level is normal, and per his pulmonologist  [name], sarcoidosis is unlikely. [place] 8/19 showed mild pulmonary hypertension, mildly elevated PCWP.  Suspect there is group 2 PH from mildly elevated PCWP but also group 3 PH from OHS/OSA and parenchymal lung disease.  Unlikely group 1.  PVR only 3.5 WU. Echo (7/23) EF 70-75%, hyperdynamic EF with moderate LVH, RV normal systolic function, severely elevated pulmonary artery systolic pressures 77.9 mm Hg.  Patient has NYHA class III dyspnea, functional status confounded by physical deconditioning and recent hospitalization.  He is volume overloaded today, REDs 41%.  - Start spiro 12.5 mg daily. BMET, BNP today, repeat BMET in 1 week. - Increase Lasix to 40 mg bid x 3 days, then back to Lasix 40 mg daily. - No SGLT2i with incontinence issues. Followed by Urology. - Defer 6 MW today with volume overload. - We discussed repeating RHC to better assess right sided pressures and evaluate need for pulmonary vasodilators. Risks/benefits discussed with patient and he is agreeable. Discussed with Dr. Aundra Dubin, will arrange. 2. CAD: Coronary calcification on 3/19 chest CT.  Patient has a history of chest pain.  LHC (8/19) showed no significant coronary disease. No recent chest pain.  - Continue ASA 81.  - LDL 7/19. Check lipids next visit.  3. HTN:  Elevated. Diurese as above. - Start spiro 12.5 mg daily. - Given Rx for BP cuff and asked to check daily and log. - Can add amlodipine back if BP remains elevated. 4. OHS/OSA: Continue oxygen 5L during day, unable to tolerate CPAP. Advised discussing CPAP with [name] at his follow up next month. 5. DM2: A1c 6.9 (7/23) 6. Obesity: Body mass index is 43.46 kg/m. - Discussed weight loss. - Consider referral to pharmacy for semaglutide. Will wait until he is farther out from hospitalization. 7. SBO: resolved w/ medical management. Tolerating regular diet and having BMs.  Arrange RHC with Dr. Aundra Dubin, follow up with APP 2-3 weeks after.  Maricela Bo [facility] FNP-BC 07/03/2022

## 2022-07-13 ENCOUNTER — Ambulatory Visit (HOSPITAL_COMMUNITY)
Admission: RE | Disposition: A | Discharge status: Home / Self Care | Payer: Self-pay | Source: Home / Self Care | Attending: Cardiology

## 2022-07-13 ENCOUNTER — Ambulatory Visit (HOSPITAL_COMMUNITY)
Admission: RE | Admit: 2022-07-13 | Discharge: 2022-07-13 | Disposition: A | Discharge status: Home / Self Care | Payer: Medicare Other | Attending: Cardiology | Admitting: Cardiology

## 2022-07-13 DIAGNOSIS — Z7984 Long term (current) use of oral hypoglycemic drugs: Secondary | ICD-10-CM | POA: Insufficient documentation

## 2022-07-13 DIAGNOSIS — G4733 Obstructive sleep apnea (adult) (pediatric): Secondary | ICD-10-CM | POA: Diagnosis not present

## 2022-07-13 DIAGNOSIS — Z6841 Body Mass Index (BMI) 40.0 and over, adult: Secondary | ICD-10-CM | POA: Insufficient documentation

## 2022-07-13 DIAGNOSIS — I10 Essential (primary) hypertension: Secondary | ICD-10-CM | POA: Diagnosis not present

## 2022-07-13 DIAGNOSIS — Z79899 Other long term (current) drug therapy: Secondary | ICD-10-CM | POA: Insufficient documentation

## 2022-07-13 DIAGNOSIS — Z7982 Long term (current) use of aspirin: Secondary | ICD-10-CM | POA: Insufficient documentation

## 2022-07-13 DIAGNOSIS — E119 Type 2 diabetes mellitus without complications: Secondary | ICD-10-CM | POA: Insufficient documentation

## 2022-07-13 DIAGNOSIS — I272 Pulmonary hypertension, unspecified: Secondary | ICD-10-CM | POA: Diagnosis not present

## 2022-07-13 DIAGNOSIS — E669 Obesity, unspecified: Secondary | ICD-10-CM | POA: Diagnosis not present

## 2022-07-13 DIAGNOSIS — I2510 Atherosclerotic heart disease of native coronary artery without angina pectoris: Secondary | ICD-10-CM | POA: Insufficient documentation

## 2022-07-13 HISTORY — PX: RIGHT HEART CATH: CATH118263

## 2022-07-13 LAB — POCT I-STAT EG7
Acid-Base Excess: 6 mmol/L — ABNORMAL HIGH (ref 0.0–2.0)
Acid-Base Excess: 6 mmol/L — ABNORMAL HIGH (ref 0.0–2.0)
Acid-Base Excess: 6 mmol/L — ABNORMAL HIGH (ref 0.0–2.0)
Bicarbonate: 32.8 mmol/L — ABNORMAL HIGH (ref 20.0–28.0)
Bicarbonate: 32.7 mmol/L — ABNORMAL HIGH (ref 20.0–28.0)
Bicarbonate: 32.8 mmol/L — ABNORMAL HIGH (ref 20.0–28.0)
Calcium, Ion: 1.28 mmol/L (ref 1.15–1.40)
Calcium, Ion: 1.26 mmol/L (ref 1.15–1.40)
Calcium, Ion: 1.3 mmol/L (ref 1.15–1.40)
HCT: 31 % — ABNORMAL LOW (ref 39.0–52.0)
HCT: 31 % — ABNORMAL LOW (ref 39.0–52.0)
HCT: 31 % — ABNORMAL LOW (ref 39.0–52.0)
Hemoglobin: 10.5 g/dL — ABNORMAL LOW (ref 13.0–17.0)
Hemoglobin: 10.5 g/dL — ABNORMAL LOW (ref 13.0–17.0)
Hemoglobin: 10.5 g/dL — ABNORMAL LOW (ref 13.0–17.0)
O2 Saturation: 71 %
O2 Saturation: 68 %
O2 Saturation: 69 %
Potassium: 3.8 mmol/L (ref 3.5–5.1)
Potassium: 3.8 mmol/L (ref 3.5–5.1)
Potassium: 3.8 mmol/L (ref 3.5–5.1)
Sodium: 143 mmol/L (ref 135–145)
Sodium: 144 mmol/L (ref 135–145)
Sodium: 144 mmol/L (ref 135–145)
TCO2: 35 mmol/L — ABNORMAL HIGH (ref 22–32)
TCO2: 34 mmol/L — ABNORMAL HIGH (ref 22–32)
TCO2: 35 mmol/L — ABNORMAL HIGH (ref 22–32)
pCO2, Ven: 57.6 mmHg (ref 44–60)
pCO2, Ven: 56.4 mmHg (ref 44–60)
pCO2, Ven: 57.7 mmHg (ref 44–60)
pH, Ven: 7.364 (ref 7.25–7.43)
pH, Ven: 7.363 (ref 7.25–7.43)
pH, Ven: 7.371 (ref 7.25–7.43)
pO2, Ven: 40 mmHg (ref 32–45)
pO2, Ven: 37 mmHg (ref 32–45)
pO2, Ven: 38 mmHg (ref 32–45)

## 2022-07-13 LAB — GLUCOSE, CAPILLARY: Glucose-Capillary: 129 mg/dL — ABNORMAL HIGH (ref 70–99)

## 2022-07-13 SURGERY — RIGHT HEART CATH
Anesthesia: LOCAL

## 2022-07-13 MED ORDER — LIDOCAINE HCL (PF) 1 % IJ SOLN
INTRAMUSCULAR | Status: DC | PRN
  Administered 2022-07-13: 2 mL

## 2022-07-13 MED ORDER — LIDOCAINE HCL (PF) 1 % IJ SOLN
INTRAMUSCULAR | Status: AC
  Filled 2022-07-13: qty 30

## 2022-07-13 MED ORDER — SODIUM CHLORIDE 0.9 % IV SOLN: 250.0000 mL | INTRAVENOUS | Status: DC | PRN

## 2022-07-13 MED ORDER — ACETAMINOPHEN 325 MG PO TABS: 650.0000 mg | ORAL_TABLET | ORAL | Status: DC | PRN

## 2022-07-13 MED ORDER — SODIUM CHLORIDE 0.9% FLUSH: 3.0000 mL | INTRAVENOUS | Status: DC | PRN

## 2022-07-13 MED ORDER — SODIUM CHLORIDE 0.9 % IV SOLN: INTRAVENOUS | Status: DC

## 2022-07-13 MED ORDER — LABETALOL HCL 5 MG/ML IV SOLN: 10.0000 mg | INTRAVENOUS | Status: DC | PRN

## 2022-07-13 MED ORDER — HEPARIN (PORCINE) IN NACL 1000-0.9 UT/500ML-% IV SOLN
INTRAVENOUS | Status: AC
  Filled 2022-07-13: qty 500

## 2022-07-13 MED ORDER — ONDANSETRON HCL 4 MG/2ML IJ SOLN: 4.0000 mg | Freq: Four times a day (QID) | INTRAMUSCULAR | Status: DC | PRN

## 2022-07-13 MED ORDER — HYDRALAZINE HCL 20 MG/ML IJ SOLN: 10.0000 mg | INTRAMUSCULAR | Status: DC | PRN

## 2022-07-13 MED ORDER — SODIUM CHLORIDE 0.9% FLUSH: 3.0000 mL | Freq: Two times a day (BID) | INTRAVENOUS | Status: DC

## 2022-07-13 SURGICAL SUPPLY — 7 items
CATH BALLN WEDGE 5F 110CM (CATHETERS) IMPLANT
KIT HEART LEFT (KITS) ×1 IMPLANT
PACK CARDIAC CATHETERIZATION (CUSTOM PROCEDURE TRAY) ×1 IMPLANT
SHEATH GLIDE SLENDER 4/5FR (SHEATH) IMPLANT
TRANSDUCER W/STOPCOCK (MISCELLANEOUS) ×1 IMPLANT
TUBING ART PRESS 72  MALE/FEM (TUBING) ×1
TUBING ART PRESS 72 MALE/FEM (TUBING) IMPLANT

## 2022-07-13 NOTE — Interval H&P Note (Signed)
History and Physical Interval Note:  07/13/2022 1:11 PM  Colton Jackson.  has presented today for surgery, with the diagnosis of pulmonary htn.  The various methods of treatment have been discussed with the patient and family. After consideration of risks, benefits and other options for treatment, the patient has consented to  Procedure(s): RIGHT HEART CATH (N/A) as a surgical intervention.  The patient's history has been reviewed, patient examined, no change in status, stable for surgery.  I have reviewed the patient's chart and labs.  Questions were answered to the patient's satisfaction.     Brayon Bielefeld [employer]

## 2022-07-14 ENCOUNTER — Encounter (HOSPITAL_COMMUNITY): Payer: Self-pay | Admitting: Cardiology

## 2022-07-14 MED FILL — Heparin Sod (Porcine)-NaCl IV Soln 1000 Unit/500ML-0.9%: INTRAVENOUS | Qty: 500 | Status: AC

## 2022-07-26 NOTE — Progress Notes (Deleted)
HPI- M never smoker followed for Chronic Hypoxic Respiratory Failure, Allergic rhinitis, OSA, morbid obesity, obesity/ hypoventilation, complicated by back pain, DM, HBP, glaucoma, pulmonary hypertension( Cardiology) ACE level -08/05/11- WNL 24 ACE level 01/15/21- 14 (9-67) NPSG 09/22/14- AHI 72.7/ hr, CPAP to 19, weight 332 lbs PFT 10/21/16-  severe obstruction and restriction with diffusion relatively high for alveolar volume Echocardiogram 10/12/16- BNP 10/09/17- 20.5   WNL O2 qualifying Walk Test 08/07/20-- desat to 87% on rrom air, 94% on 3L HRCT chest 04/08/20- post infectious or inflammatory scarring and volume loss. There are no specific findings to suggest fibrotic interstitial lung disease.  There are numerous prominent, hyperdense, although not pathologically enlarged mediastinal and hilar lymph nodes, unchanged compared to prior examination and most consistent with nodal involvement of sarcoidosis, although occupational inhalational lung disease may also have this appearance. 4. Coronary artery disease. Aortic Atherosclerosis (ICD10-I70.0). 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 Download- compliance Body weight today- Covid vax- [place] July for SBO. Discharged on 5L O2. R Heart Cath for [facility] 8/28> PA 75/27. Group 3, possible Group 1 component.  CXR 1V 06/05/22- IMPRESSION: 1. Cardiomegaly and vascular congestion. 2. Streaky density in the bilateral lungs which could be atelectasis or infection.  ROS-see HPI   += positive Constitutional:    weight loss, night sweats, fevers, chills, fatigue, lassitude. HEENT:    headaches, difficulty swallowing, tooth/dental problems, sore throat,       sneezing, itching, ear ache, nasal congestion, post nasal drip, snoring CV:    chest pain, orthopnea, PND, swelling in lower extremities, anasarca,                                   dizziness, palpitations Resp:  + shortness of breath with exertion or at rest.                productive cough,   non-productive cough, coughing up of blood.              change in color of mucus.  wheezing.   Skin:    rash or lesions. GI:  No-   heartburn, indigestion, abdominal pain, nausea, vomiting, diarrhea,                 change in bowel habits, loss of appetite GU: dysuria, change in color of urine, no urgency or frequency.   flank pain. MS:   +joint pain, stiffness, decreased range of motion, back pain. Neuro-     nothing unusual Psych:  change in mood or affect.  depression or anxiety.   memory loss.  OBJ- Physical Exam  His O2 2L  + morbidly obese,  General- Alert, Oriented, Affect-appropriate, Distress- none acute Skin- rash-none, lesions- none, excoriation- none Lymphadenopathy- none Head- atraumatic            Eyes- Gross vision intact, PERRLA, conjunctivae and secretions clear            Ears- Hearing, canals-normal            Nose- Clear, no-Septal dev, mucus, polyps, erosion, perforation             Throat- Mallampati III  , mucosa clear , drainage- none, tonsils- atrophic Neck- flexible , trachea midline, no stridor , thyroid nl, carotid no bruit Chest - symmetrical excursion , unlabored           Heart/CV- RRR , no murmur , no gallop  , no rub, nl s1 s2                           - JVD- none , edema- none, stasis changes- none, varices- none           Lung- clear/distant, wheeze- none, cough- none , dullness-none, rub- none           Chest wall-  Abd-  Br/ Gen/ Rectal- Not done, not indicated Extrem- cyanosis- none, clubbing, none, atrophy- none, strength- nl Neuro- grossly intact to observation

## 2022-07-27 ENCOUNTER — Ambulatory Visit: Payer: Medicare Other | Admitting: Internal Medicine

## 2022-08-04 ENCOUNTER — Encounter (HOSPITAL_COMMUNITY): Payer: Self-pay

## 2022-08-04 ENCOUNTER — Ambulatory Visit (HOSPITAL_COMMUNITY)
Admission: RE | Admit: 2022-08-04 | Discharge: 2022-08-04 | Disposition: A | Discharge status: Home / Self Care | Payer: Medicare Other | Source: Ambulatory Visit | Attending: Family Medicine | Admitting: Family Medicine

## 2022-08-04 VITALS — BP 150/70 | HR 85 | Wt 285.0 lb

## 2022-08-04 DIAGNOSIS — I272 Pulmonary hypertension, unspecified: Secondary | ICD-10-CM

## 2022-08-04 DIAGNOSIS — I50810 Right heart failure, unspecified: Secondary | ICD-10-CM | POA: Diagnosis not present

## 2022-08-04 DIAGNOSIS — Z9989 Dependence on other enabling machines and devices: Secondary | ICD-10-CM | POA: Diagnosis not present

## 2022-08-04 DIAGNOSIS — K56609 Unspecified intestinal obstruction, unspecified as to partial versus complete obstruction: Secondary | ICD-10-CM | POA: Insufficient documentation

## 2022-08-04 DIAGNOSIS — Z6841 Body Mass Index (BMI) 40.0 and over, adult: Secondary | ICD-10-CM | POA: Insufficient documentation

## 2022-08-04 DIAGNOSIS — R0600 Dyspnea, unspecified: Secondary | ICD-10-CM | POA: Insufficient documentation

## 2022-08-04 DIAGNOSIS — E119 Type 2 diabetes mellitus without complications: Secondary | ICD-10-CM

## 2022-08-04 DIAGNOSIS — I2721 Secondary pulmonary arterial hypertension: Secondary | ICD-10-CM | POA: Diagnosis not present

## 2022-08-04 DIAGNOSIS — J449 Chronic obstructive pulmonary disease, unspecified: Secondary | ICD-10-CM | POA: Insufficient documentation

## 2022-08-04 DIAGNOSIS — G4733 Obstructive sleep apnea (adult) (pediatric): Secondary | ICD-10-CM | POA: Diagnosis not present

## 2022-08-04 DIAGNOSIS — Z79899 Other long term (current) drug therapy: Secondary | ICD-10-CM | POA: Diagnosis not present

## 2022-08-04 DIAGNOSIS — I2510 Atherosclerotic heart disease of native coronary artery without angina pectoris: Secondary | ICD-10-CM | POA: Diagnosis not present

## 2022-08-04 DIAGNOSIS — I10 Essential (primary) hypertension: Secondary | ICD-10-CM

## 2022-08-04 LAB — BASIC METABOLIC PANEL
Anion gap: 10 (ref 5–15)
BUN: 16 mg/dL (ref 8–23)
CO2: 31 mmol/L (ref 22–32)
Calcium: 9.6 mg/dL (ref 8.9–10.3)
Chloride: 100 mmol/L (ref 98–111)
Creatinine, Ser: 1.23 mg/dL (ref 0.61–1.24)
GFR, Estimated: >60 mL/min (ref >60)
Glucose, Bld: 147 mg/dL — ABNORMAL HIGH (ref 70–99)
Potassium: 4.3 mmol/L (ref 3.5–5.1)
Sodium: 141 mmol/L (ref 135–145)

## 2022-08-04 LAB — LIPID PANEL
Cholesterol: 111 mg/dL (ref 0–200)
HDL: 27 mg/dL — ABNORMAL LOW (ref >40)
LDL Cholesterol: 62 mg/dL (ref 0–99)
Total CHOL/HDL Ratio: 4.1 RATIO
Triglycerides: 111 mg/dL (ref <150)
VLDL: 22 mg/dL (ref 0–40)

## 2022-08-04 LAB — BRAIN NATRIURETIC PEPTIDE: B Natriuretic Peptide: 45.8 pg/mL (ref 0.0–100.0)

## 2022-08-04 MED ORDER — FUROSEMIDE 40 MG PO TABS: 40.0000 mg | ORAL_TABLET | Freq: Two times a day (BID) | ORAL | 3 refills | Status: DC

## 2022-08-04 MED ORDER — SPIRONOLACTONE 25 MG PO TABS: 25.0000 mg | ORAL_TABLET | Freq: Every day | ORAL | 3 refills | Status: DC

## 2022-08-04 NOTE — Addendum Note (Signed)
Encounter addended by: Rafael Bihari, FNP on: 08/04/2022 4:44 PM  Actions taken: Clinical Note Signed

## 2022-08-04 NOTE — Patient Instructions (Addendum)
Labs done today. We will contact you only if your labs are abnormal.  INCREASE Spironolactone 66m (1 tablet) by mouth daily.  INCREASE Lasix to 415m(1 tablet) by mouth 2 times daily.   No other medication changes were made. Please continue all current medications as prescribed.  Your physician recommends that you schedule a follow-up appointment in: 1 week for a lab only appointment([facility] with Dr. McAundra DubinIf you have any questions or concerns before your next appointment please send usKorea message through [facility]The [facility] call our office at [phone]   TO LEAVE A MESSAGE FOR THE NURSE SELECT OPTION 2, PLEASE LEAVE A MESSAGE INCLUDING: YOUR NAME DATE OF BIRTH CALL BACK NUMBER REASON FOR CALL**this is important as we prioritize the call backs  YOU WILL RECEIVE A CALL BACK THE SAME DAY AS LONG AS YOU CALL BEFORE 4:00 PM   Do the following things EVERYDAY: Weigh yourself in the morning before breakfast. Write it down and keep it in a log. Take your medicines as prescribed Eat low salt foods--Limit salt (sodium) to 2000 mg per day.  Stay as active as you can everyday Limit all fluids for the day to less than 2 liters   [facility] and your health needs are our priority. As part of our continuing mission to provide you with exceptional heart care, we have created designated Provider Care Teams. These Care Teams include your primary Cardiologist (physician) and Advanced Practice Providers (APPs- Physician Assistants and Nurse Practitioners) who all work together to provide you with the care you need, when you need it.   You may see any of the following providers on your designated Care Team at your next follow up: Dr DaGlori Bickersr DaHaynes KernsNP BrLyda JesterPAUtahaAudry RilesPharmD   Please be sure to bring in all your medications bottles to every appointment.

## 2022-08-04 NOTE — Progress Notes (Signed)
ReDS Vest / Clip - 08/04/22 1100       ReDS Vest / Clip   Station Marker D    Ruler Value 37    ReDS Value Range High volume overload    ReDS Actual Value 44

## 2022-08-04 NOTE — Progress Notes (Addendum)
PCP: Dr. Hilma Favors Cardiology: Dr. Domenic Polite HF Cardiology: Dr. Aundra Dubin  71 y.o. with history of OHS/OSA, chronic bronchitis, and suspected pulmonary hypertension was referred by Dr. Domenic Polite for evaluation of pulmonary hypertension. Patient is on 2L home oxygen and CPAP. He has had gradually worsening exertional dyspnea over the last year.  He was able to mow his grass last fall but cannot now.  He is short of breath after walking about 50-60 feet with his oxygen on.  He is short of breath with any incline.   Echo in 3/19 showed normal EF with PASP 72 mmHg.  I had him do left/right heart cath in 8/19.  This showed no CAD and moderate mixed pulmonary venous/pulmonary arterial hypertension.  Very mildly elevated filling pressures.    He is followed by pulmonary.  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 V/Q scan in 9/19 did not show evidence for chronic PE.   Last seen 12/2018.  Admitted 7/23 with SBO, treated medically. Has a/c respiratory failure and cardiology consulted. Echo showed EF 70-75%, hyperdynamic EF with moderate LVH, RV normal systolic function, severely elevated pulmonary artery systolic pressures 06.2 mm Hg. Discharged home on Lasix 40 mg, weight 273 lbs. Advised HF follow up to discuss RHC.  [place] 8/23 with Dr. Aundra Dubin showed near normal filling pressures, severe pulmonary arterial hypertension, concern for group 3 PH with OHS/OSA, but cannot totally rule out group 1 component, and preserved cardiac output. Recommend continue management of OSA/OHS and oxygen.  Today he returns for [facility] HF follow up. Overall feeling fine. Continues on 5L oxygen, now able to tolerate CPAP with new mask. He is not markedly short of breath with ADLs or walking short distances around his house. Denies palpitations, CP, dizziness, edema, or PND/Orthopnea. Appetite ok. No fever or chills. He does not weigh regularly at home. Taking  all medications. Wears compression hose. Home BP 150/80.  ReDs: 44%  ECG (personally reviewed): none ordered today.  Labs (11/18): BNP 21 Labs (7/19): LDL 79 Labs (8/19): K 3.9, creatinine 0.73 Labs (9/19): ACE normal, K 3.8, creatinine 1.13 Labs (7/23): K 3.2, creatinine 1.11 Labs (8/23): K 3.8, creatinine 1.12  PMH: 1. OHS/OSA: Uses 5L home oxygen and CPAP.  2. Obesity 3. HTN 4. Type II diabetes 5. Chronic bronchitis: ?from welding.  Never smoked.    - CT chest (3/19): coronary calcification, mild emphysema, calcified mediastinal and hilar nodes consistent with prior granulomatous infection.  - PFTs (12/17): FVC 56%, FEV1 57%, ratio 101%, SVC 109%, DLCO 66%.  Moderate obstruction/severe restriction.  6. CAD: Coronary calcification on CT chest 3/19.   - Cardiolite in 2012 showed no ischemia.  - LHC (8/19): No significant CAD.  7. Pulmonary hypertension: Echo (3/19) with EF 65-70%, moderate LVH, PASP 72 mmHg.  - RHC (8/19): mean RA 8, PA 67/24 mean 43, mean PCWP 17, CI 2.99, PVR 3.5 WU.  - V/Q scan (9/19): No evidence for chronic PE.  - Echo (7/23): EF 70-75%, hyperdynamic EF with moderate LVH, RV normal systolic function, PASP 69.4 mmHg - RHC (8/23): mean RA 9, PA 75/27 mean 45, mean PCWP 13, CI 2.99, PVR 4.6 WU.   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 Lives in [place].   Family History  Problem Relation Age of Onset   Lung disease Father    Stroke Father 12   Diabetes Father    Diabetes Maternal Grandfather  Heart disease Paternal Grandmother    Stroke Paternal Grandfather 85   Diabetes Paternal Grandfather    Diabetes Mother    Cancer Mother    Stroke Mother 30   Diabetes Sister    Diabetes Brother 36   ROS: All systems reviewed and negative except as per HPI.   Current Outpatient Medications  Medication Sig Dispense Refill   acetaminophen (TYLENOL) 500 MG tablet Take 500 mg by mouth 3 (three) times daily as needed (pain.).      Cholecalciferol (VITAMIN D3) 10 MCG (400 UNIT) CAPS Take 1 capsule by mouth daily.     cromolyn (NASALCROM) 5.2 MG/ACT nasal spray Place 1 spray into both nostrils 3 (three) times daily as needed for allergies.      famotidine (PEPCID) 20 MG tablet Take 20 mg by mouth 2 (two) times daily.      furosemide (LASIX) 40 MG tablet Take 40 mg by mouth 2 (two) times daily.     glipiZIDE (GLUCOTROL) 10 MG tablet Take 10 mg by mouth 2 (two) times daily.      HYDROcodone-acetaminophen (NORCO) 10-325 MG tablet Take 1 tablet by mouth 3 (three) times daily as needed for moderate pain.      Krill Oil 500 MG CAPS Take 500 mg by mouth daily.      latanoprost (XALATAN) 0.005 % ophthalmic solution INSTILL 1 DROP IN BOTH EYES EVERY DAY AT BEDTIME 7.5 mL 12   LEVEMIR FLEXPEN 100 UNIT/ML FlexPen Inject 25-50 Units into the skin daily as needed (blood sugar greater than 130.).     losartan (COZAAR) 50 MG tablet Take 1 tablet (50 mg total) by mouth daily. 30 tablet 1   meclizine (ANTIVERT) 25 MG tablet Take 25 mg by mouth 3 (three) times daily as needed for dizziness or nausea (when taking hydrocodone/apap).      meloxicam (MOBIC) 7.5 MG tablet Take 7.5 mg by mouth 2 (two) times daily.     metFORMIN (GLUCOPHAGE-XR) 500 MG 24 hr tablet Take 1,000 mg by mouth 2 (two) times daily.     metoprolol succinate (TOPROL-XL) 50 MG 24 hr tablet Take 1 tablet (50 mg total) by mouth daily. 30 tablet 3   OXYGEN Inhale 5 L/hr into the lungs daily.     potassium chloride (KLOR-CON M) 10 MEQ tablet Take 1 tablet (10 mEq total) by mouth daily. 15 tablet 0   psyllium (METAMUCIL) 58.6 % powder Take 1 packet by mouth 2 (two) times daily.      spironolactone (ALDACTONE) 25 MG tablet Take 0.5 tablets (12.5 mg total) by mouth daily. 15 tablet 3   trospium (SANCTURA) 20 MG tablet Take 1 tablet (20 mg total) by mouth 2 (two) times daily. 180 tablet 3   No current facility-administered medications for this encounter.   Wt Readings from Last 3  Encounters:  08/04/22 129.3 kg (285 lb)  07/13/22 125.6 kg (277 lb)  07/03/22 125.9 kg (277 lb 8 oz)   BP (!) 150/70   Pulse 85   Wt 129.3 kg (285 lb)   SpO2 97% Comment: Patient on 5-liters of oxygen.  BMI 43.33 kg/m  Physical Exam: General:  NAD. No resp difficulty, arrived in [facility] on oxygen. HEENT: Normal Neck: Supple. JVP 7-8. Carotids 2+ bilat; no bruits. No lymphadenopathy or thryomegaly appreciated. Cor: PMI nondisplaced. Regular rate & rhythm. No rubs, gallops or murmurs. Lungs: Clear Abdomen: Obese, nontender, nondistended. No hepatosplenomegaly. No bruits or masses. Good bowel sounds. Extremities: No cyanosis, clubbing, rash, 1+  BLE pre-tibial edema Neuro: Alert & oriented x 3, cranial nerves grossly intact. Moves all 4 extremities w/o difficulty. Affect pleasant.  Assessment/Plan: 1. Pulmonary hypertension: Echo in 3/19 showed estimated PA systolic pressure 72.  He has chronic bronchitis as well as OHS/OSA.  Interestingly, chest CT showed calcified mediastinal and hilar nodes suggestive of prior granulomatous infection => could this actually represent sarcoidosis?  ACE level is normal, and per his pulmonologist [name], sarcoidosis is unlikely. [place] 8/19 showed mild pulmonary hypertension, mildly elevated PCWP.  Suspect there is group 2 PH from mildly elevated PCWP but also group 3 PH from OHS/OSA and parenchymal lung disease.  Unlikely group 1.  PVR only 3.5 WU. Echo (7/23) EF 70-75%, hyperdynamic EF with moderate LVH, RV normal systolic function, severely elevated pulmonary artery systolic pressures 49.6 mm Hg.  RHC (8/23) showed near normal filling pressures, severe pulmonary arterial hypertension, concern for group 3 PH with OHS/OSA, but cannot totally rule out group 1 component, and preserved cardiac output. Could consider Tyvaso for ? Group 1 component. Patient has NYHA class III dyspnea, functional status confounded by physical deconditioning and body habitus. He is volume  overloaded today on exam, weight up 8 lbs, REDs 44%.  - Increase spironolactone to 25 mg daily. BMET/BNP today, repeat BMET in 1 week (Rx for [facility] given). - Increase Lasix to 40 mg bid.  - Continue losartan 50 mg daily. - No SGLT2i with incontinence issues. Followed by Urology. - Defer 6 MW today with volume overload. - Continue compression hose. - I asked him to weigh daily. Discussed salt and fluid restrictions. 2. CAD: Coronary calcification on 3/19 chest CT.  Patient has a history of chest pain.  LHC (8/19) showed no significant coronary disease. No recent chest pain.  - Continue ASA 81.  - LDL 79 (7/19). Check lipids today. 3. HTN: Elevated. Diurese as above. - Increase spiro as above. - Continue BP checks at  home and log. - Can add amlodipine back if BP remains elevated. 4. OHS/OSA: Continue oxygen 5L during day, now wearing CPAP. He missed his follow up with [name], I asked him to call and re-schedule. 5. DM2: A1c 6.9 (7/23) 6. Obesity: Body mass index is 43.33 kg/m. - Discussed weight loss. - Consider referral to pharmacy for semaglutide. Will wait until he is farther out from hospitalization. 7. SBO: resolved w/ medical management. Tolerating regular diet and having BMs.  Follow up in 2-3 months with Dr. Aundra Dubin.  Maricela Bo [facility] FNP-BC 08/04/2022

## 2022-08-09 NOTE — Progress Notes (Signed)
HPI- M never smoker followed for Chronic Hypoxic Respiratory Failure, Allergic rhinitis, OSA, morbid obesity, obesity/ hypoventilation, complicated by back pain, DM, HBP, glaucoma, pulmonary hypertension( Cardiology) ACE level -08/05/11- WNL 24 ACE level 01/15/21- 14 (9-67) NPSG 09/22/14- AHI 72.7/ hr, CPAP to 19, weight 332 lbs PFT 10/21/16-  severe obstruction and restriction with diffusion relatively high for alveolar volume Echocardiogram 10/12/16- BNP 10/09/17- 20.5   WNL O2 qualifying Walk Test 08/07/20-- desat to 87% on rrom air, 94% on 3L HRCT chest 04/08/20- post infectious or inflammatory scarring and volume loss. There are no specific findings to suggest fibrotic interstitial lung disease.  There are numerous prominent, hyperdense, although not pathologically enlarged mediastinal and hilar lymph nodes, unchanged compared to prior examination and most consistent with nodal involvement of sarcoidosis, although occupational inhalational lung disease may also have this appearance. 4. Coronary artery disease. Aortic Atherosclerosis (ICD10-I70.0). 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 Download- compliance 100%, AHI 6/ hr Body weight today-274 lbs Covid vax- 3 Moderna Flu vax - will get at pharmacy- we are out of senior strength Breathing is comfortable. Use O2 5L at home with long hose. 2L with continuous flow portable tank. Arrival O2 sat 94% today on 2L. Complains of bothersome runny nose- ?vasomotor from O2 flow? R Heart Cath 07/13/22- severe PAH ? Group 3, possible group 1 component>continue O2, CPAP but consider Tyvaso. Cardiology follows. CXR 06/05/22 1V- IMPRESSION: 1. Cardiomegaly and vascular congestion. 2. Streaky density in the bilateral lungs which could be atelectasis or infection.  ROS-see HPI   += positive Constitutional:    weight loss, night sweats, fevers, chills, fatigue, lassitude. HEENT:    headaches, difficulty swallowing, tooth/dental problems, sore throat,       sneezing, itching, ear ache, nasal congestion, post nasal drip, snoring CV:    chest pain, orthopnea, PND, swelling in lower extremities, anasarca,                                   dizziness, palpitations Resp:  + shortness of breath with exertion or at rest.                productive cough,   non-productive cough, coughing up of blood.              change in color of mucus.  wheezing.   Skin:    rash or lesions. GI:  No-   heartburn, indigestion, abdominal pain, nausea, vomiting, diarrhea,                 change in bowel habits, loss of appetite GU: dysuria, change in color  of urine, no urgency or frequency.   flank pain. MS:   +joint pain, stiffness, decreased range of motion, back pain. Neuro-     nothing unusual Psych:  change in mood or affect.  depression or anxiety.   memory loss.  OBJ- Physical Exam     His O2 2L  + morbidly obese, on O2 2L tank General- Alert, Oriented, Affect-appropriate, Distress- none acute Skin-  rash-none, lesions- none, excoriation- none Lymphadenopathy- none Head- atraumatic            Eyes- Gross vision intact, PERRLA, conjunctivae and secretions clear            Ears- Hearing, canals-normal            Nose- Clear, no-Septal dev, mucus, polyps, erosion, perforation             Throat- Mallampati III , mucosa clear , drainage- none, tonsils- atrophic Neck- flexible , trachea midline, no stridor , thyroid nl, carotid no bruit Chest - symmetrical excursion , unlabored           Heart/CV- RRR , no murmur , no gallop  , no rub, nl s1 s2                           - JVD- none , edema- none, stasis changes- none, varices- none           Lung- clear/distant- I don't hear rales or crackles., wheeze- none, cough- none , dullness-none, rub- none           Chest wall-  Abd-  Br/ Gen/ Rectal- Not done, not indicated Extrem- cyanosis- none, clubbing, none, atrophy- none, strength- nl Neuro- grossly intact to observation

## 2022-08-10 ENCOUNTER — Ambulatory Visit (INDEPENDENT_AMBULATORY_CARE_PROVIDER_SITE_OTHER): Payer: Medicare Other | Admitting: Internal Medicine

## 2022-08-10 ENCOUNTER — Encounter: Payer: Self-pay | Admitting: Internal Medicine

## 2022-08-10 DIAGNOSIS — G894 Chronic pain syndrome: Secondary | ICD-10-CM | POA: Diagnosis not present

## 2022-08-10 DIAGNOSIS — J3089 Other allergic rhinitis: Secondary | ICD-10-CM

## 2022-08-10 DIAGNOSIS — G4733 Obstructive sleep apnea (adult) (pediatric): Secondary | ICD-10-CM

## 2022-08-10 DIAGNOSIS — I10 Essential (primary) hypertension: Secondary | ICD-10-CM | POA: Diagnosis not present

## 2022-08-10 DIAGNOSIS — J9611 Chronic respiratory failure with hypoxia: Secondary | ICD-10-CM | POA: Diagnosis not present

## 2022-08-10 DIAGNOSIS — J449 Chronic obstructive pulmonary disease, unspecified: Secondary | ICD-10-CM | POA: Diagnosis not present

## 2022-08-10 DIAGNOSIS — J302 Other seasonal allergic rhinitis: Secondary | ICD-10-CM

## 2022-08-10 DIAGNOSIS — I2510 Atherosclerotic heart disease of native coronary artery without angina pectoris: Secondary | ICD-10-CM

## 2022-08-10 DIAGNOSIS — M1991 Primary osteoarthritis, unspecified site: Secondary | ICD-10-CM | POA: Diagnosis not present

## 2022-08-10 MED ORDER — AZELASTINE HCL 0.1 % NA SOLN: NASAL | 12 refills | Status: AC

## 2022-08-10 NOTE — Assessment & Plan Note (Signed)
Dependent on O2 Plan- continue 5L at home, 2l portable continuous tank

## 2022-08-10 NOTE — Patient Instructions (Signed)
We can continue CPAP 19 and oxygen 2-5L  Script sent to try astelin nasal spray for excessive drainage.

## 2022-08-10 NOTE — Assessment & Plan Note (Signed)
Benefits from CPAP with good compliance and adequate control Plan- continue CPAP 19 with O2

## 2022-08-10 NOTE — Assessment & Plan Note (Signed)
Probable vasomotor rhinitis Plan- try astelin nasal spray at bedtime

## 2022-09-25 ENCOUNTER — Other Ambulatory Visit (HOSPITAL_COMMUNITY): Payer: Self-pay | Admitting: *Deleted

## 2022-09-25 ENCOUNTER — Other Ambulatory Visit (HOSPITAL_COMMUNITY): Payer: Self-pay | Admitting: Family Medicine

## 2022-09-25 MED ORDER — LOSARTAN POTASSIUM 50 MG PO TABS: 50.0000 mg | ORAL_TABLET | Freq: Every day | ORAL | 1 refills | Status: DC

## 2022-09-29 DIAGNOSIS — G894 Chronic pain syndrome: Secondary | ICD-10-CM | POA: Diagnosis not present

## 2022-09-29 DIAGNOSIS — J449 Chronic obstructive pulmonary disease, unspecified: Secondary | ICD-10-CM | POA: Diagnosis not present

## 2022-10-29 ENCOUNTER — Ambulatory Visit (HOSPITAL_COMMUNITY)
Admission: RE | Admit: 2022-10-29 | Discharge: 2022-10-29 | Disposition: A | Discharge status: Home / Self Care | Payer: Medicare Other | Source: Ambulatory Visit | Attending: Cardiology | Admitting: Cardiology

## 2022-10-29 ENCOUNTER — Encounter (HOSPITAL_COMMUNITY): Payer: Self-pay | Admitting: Cardiology

## 2022-10-29 ENCOUNTER — Other Ambulatory Visit (HOSPITAL_COMMUNITY): Payer: Self-pay

## 2022-10-29 ENCOUNTER — Telehealth (HOSPITAL_COMMUNITY): Payer: Self-pay | Admitting: *Deleted

## 2022-10-29 VITALS — BP 124/64 | HR 68 | Wt 277.8 lb

## 2022-10-29 DIAGNOSIS — I10 Essential (primary) hypertension: Secondary | ICD-10-CM | POA: Diagnosis not present

## 2022-10-29 DIAGNOSIS — I2721 Secondary pulmonary arterial hypertension: Secondary | ICD-10-CM | POA: Diagnosis not present

## 2022-10-29 DIAGNOSIS — Z79899 Other long term (current) drug therapy: Secondary | ICD-10-CM | POA: Insufficient documentation

## 2022-10-29 DIAGNOSIS — I272 Pulmonary hypertension, unspecified: Secondary | ICD-10-CM | POA: Diagnosis not present

## 2022-10-29 DIAGNOSIS — J9611 Chronic respiratory failure with hypoxia: Secondary | ICD-10-CM

## 2022-10-29 DIAGNOSIS — Z6841 Body Mass Index (BMI) 40.0 and over, adult: Secondary | ICD-10-CM | POA: Diagnosis not present

## 2022-10-29 DIAGNOSIS — G4733 Obstructive sleep apnea (adult) (pediatric): Secondary | ICD-10-CM | POA: Insufficient documentation

## 2022-10-29 DIAGNOSIS — J449 Chronic obstructive pulmonary disease, unspecified: Secondary | ICD-10-CM | POA: Diagnosis not present

## 2022-10-29 DIAGNOSIS — E119 Type 2 diabetes mellitus without complications: Secondary | ICD-10-CM | POA: Diagnosis not present

## 2022-10-29 DIAGNOSIS — I50810 Right heart failure, unspecified: Secondary | ICD-10-CM

## 2022-10-29 DIAGNOSIS — I2510 Atherosclerotic heart disease of native coronary artery without angina pectoris: Secondary | ICD-10-CM | POA: Diagnosis not present

## 2022-10-29 DIAGNOSIS — R0609 Other forms of dyspnea: Secondary | ICD-10-CM | POA: Diagnosis not present

## 2022-10-29 LAB — BASIC METABOLIC PANEL
Anion gap: 10 (ref 5–15)
BUN: 37 mg/dL — ABNORMAL HIGH (ref 8–23)
CO2: 28 mmol/L (ref 22–32)
Calcium: 9.7 mg/dL (ref 8.9–10.3)
Chloride: 101 mmol/L (ref 98–111)
Creatinine, Ser: 2.41 mg/dL — ABNORMAL HIGH (ref 0.61–1.24)
GFR, Estimated: 28 mL/min — ABNORMAL LOW (ref >60)
Glucose, Bld: 139 mg/dL — ABNORMAL HIGH (ref 70–99)
Potassium: 4.3 mmol/L (ref 3.5–5.1)
Sodium: 139 mmol/L (ref 135–145)

## 2022-10-29 LAB — BRAIN NATRIURETIC PEPTIDE: B Natriuretic Peptide: 15.2 pg/mL (ref 0.0–100.0)

## 2022-10-29 MED ORDER — LOSARTAN POTASSIUM 50 MG PO TABS: 50.0000 mg | ORAL_TABLET | Freq: Every day | ORAL | 3 refills | Status: DC

## 2022-10-29 MED ORDER — DAPAGLIFLOZIN PROPANEDIOL 10 MG PO TABS: 10.0000 mg | ORAL_TABLET | Freq: Every day | ORAL | 11 refills | Status: DC

## 2022-10-29 MED ORDER — LOSARTAN POTASSIUM 50 MG PO TABS: 25.0000 mg | ORAL_TABLET | Freq: Every day | ORAL | 3 refills | Status: DC

## 2022-10-29 MED ORDER — FUROSEMIDE 40 MG PO TABS: 40.0000 mg | ORAL_TABLET | Freq: Every day | ORAL | 3 refills | Status: DC

## 2022-10-29 NOTE — Progress Notes (Signed)
PCP: Dr. Hilma Favors Cardiology: Dr. Domenic Polite HF Cardiology: Dr. Aundra Dubin  71 y.o. with history of OHS/OSA, chronic bronchitis, and suspected pulmonary hypertension was referred by Dr. Domenic Polite for evaluation of pulmonary hypertension. Patient is on 2L home oxygen and CPAP. He has had gradually worsening exertional dyspnea over the last year.  He was able to mow his grass last fall but cannot now.  He is short of breath after walking about 50-60 feet with his oxygen on.  He is short of breath with any incline.   Echo in 3/19 showed normal EF with PASP 72 mmHg.  I had him do left/right heart cath in 8/19.  This showed no CAD and moderate mixed pulmonary venous/pulmonary arterial hypertension.  Very mildly elevated filling pressures.    He is followed by pulmonary.  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 V/Q scan in 9/19 did not show evidence for chronic PE.   Admitted 7/23 with SBO, treated medically. Has a/c respiratory failure and cardiology consulted. Echo showed EF 70-75%, hyperdynamic EF with moderate LVH, RV normal systolic function, severely elevated pulmonary artery systolic pressures 31.9 mm Hg. Discharged home on Lasix 40 mg, weight 273 lbs. Advised HF follow up to discuss RHC.  [place] 8/23 showed near normal filling pressures, severe pulmonary arterial hypertension, concern for group 3 PH with OHS/OSA, but cannot totally rule out group 1 component, and preserved cardiac output. Recommend continue management of OSA/OHS with oxygen/CPAP.  He returns today for followup of pulmonary hypertension/RV failure.  He is using CPAP at night and oxygen during the day.  He is not very active and says that his legs are weak. Generally no dyspnea walking in the house with his walker, dyspnea walking longer distances.  No orthopnea/PND. No lightheadedness. No chest pain.  Weight stable.   ECG (personally reviewed): NSR, normal  Labs  (11/18): BNP 21 Labs (7/19): LDL 79 Labs (8/19): K 3.9, creatinine 0.73 Labs (9/19): ACE normal, K 3.8, creatinine 1.13 Labs (7/23): K 3.2, creatinine 1.11 Labs (8/23): K 3.8, creatinine 1.12, BNP 46, LDL 62, TGs 111  PMH: 1. OHS/OSA: Uses 5L home oxygen and CPAP.  2. Obesity 3. HTN 4. Type II diabetes 5. Chronic bronchitis: ?from welding.  Never smoked.    - CT chest (3/19): coronary calcification, mild emphysema, calcified mediastinal and hilar nodes consistent with prior granulomatous infection.  - PFTs (12/17): FVC 56%, FEV1 57%, ratio 101%, SVC 109%, DLCO 66%.  Moderate obstruction/severe restriction.  6. CAD: Coronary calcification on CT chest 3/19.   - Cardiolite in 2012 showed no ischemia.  - LHC (8/19): No significant CAD.  7. Pulmonary hypertension: Echo (3/19) with EF 65-70%, moderate LVH, PASP 72 mmHg.  - RHC (8/19): mean RA 8, PA 67/24 mean 43, mean PCWP 17, CI 2.99, PVR 3.5 WU.  - V/Q scan (9/19): No evidence for chronic PE.  - Echo (7/23): EF 70-75%, hyperdynamic EF with moderate LVH, RV normal systolic function, PASP 24.3 mmHg - RHC (8/23): mean RA 9, PA 75/27 mean 45, mean PCWP 13, CI 2.99, PVR 4.6 WU.  8. 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   Family History  Problem Relation Age of Onset   Lung disease Father    Stroke Father 73   Diabetes Father    Diabetes Maternal Grandfather    Heart disease Paternal Grandmother  Stroke Paternal Grandfather 74   Diabetes Paternal Grandfather    Diabetes Mother    Cancer Mother    Stroke Mother 58   Diabetes Sister    Diabetes Brother 5   ROS: All systems reviewed and negative except as per HPI.   Current Outpatient Medications  Medication Sig Dispense Refill   acetaminophen (TYLENOL) 500 MG tablet Take 500 mg by mouth 3 (three) times daily as needed (pain.).     azelastine (ASTELIN) 0.1 % nasal spray 1 puff each nostril twice daily as needed  30 mL 12   Cholecalciferol (VITAMIN D3) 10 MCG (400 UNIT) CAPS Take 1 capsule by mouth daily.     cromolyn (NASALCROM) 5.2 MG/ACT nasal spray Place 1 spray into both nostrils 3 (three) times daily as needed for allergies.      famotidine (PEPCID) 20 MG tablet Take 20 mg by mouth 2 (two) times daily.      glipiZIDE (GLUCOTROL) 10 MG tablet Take 10 mg by mouth 2 (two) times daily.      HYDROcodone-acetaminophen (NORCO) 10-325 MG tablet Take 1 tablet by mouth 3 (three) times daily as needed for moderate pain.      Krill Oil 500 MG CAPS Take 500 mg by mouth daily.      latanoprost (XALATAN) 0.005 % ophthalmic solution INSTILL 1 DROP IN BOTH EYES EVERY DAY AT BEDTIME 7.5 mL 12   LEVEMIR FLEXPEN 100 UNIT/ML FlexPen Inject 25-50 Units into the skin daily as needed (blood sugar greater than 130.).     meclizine (ANTIVERT) 25 MG tablet Take 25 mg by mouth 3 (three) times daily as needed for dizziness or nausea (when taking hydrocodone/apap).      meloxicam (MOBIC) 7.5 MG tablet Take 7.5 mg by mouth 2 (two) times daily.     metFORMIN (GLUCOPHAGE-XR) 500 MG 24 hr tablet Take 1,000 mg by mouth 2 (two) times daily.     metoprolol succinate (TOPROL-XL) 50 MG 24 hr tablet Take 1 tablet (50 mg total) by mouth daily. 30 tablet 3   OXYGEN Inhale 5 L/hr into the lungs daily.     psyllium (METAMUCIL) 58.6 % powder Take 1 packet by mouth 2 (two) times daily.      spironolactone (ALDACTONE) 25 MG tablet Take 1 tablet (25 mg total) by mouth daily. 90 tablet 3   trospium (SANCTURA) 20 MG tablet Take 1 tablet (20 mg total) by mouth 2 (two) times daily. 180 tablet 3   furosemide (LASIX) 40 MG tablet Take 1 tablet (40 mg total) by mouth daily. 180 tablet 3   losartan (COZAAR) 50 MG tablet Take 0.5 tablets (25 mg total) by mouth daily. 90 tablet 3   No current facility-administered medications for this encounter.   Wt Readings from Last 3 Encounters:  10/29/22 126 kg (277 lb 12.8 oz)  08/10/22 124.5 kg (274 lb 6.4 oz)   08/04/22 129.3 kg (285 lb)   BP 124/64   Pulse 68   Wt 126 kg (277 lb 12.8 oz)   SpO2 96% Comment: 2l n/c  BMI 42.24 kg/m  General: NAD Neck: No JVD, no thyromegaly or thyroid nodule.  Lungs: Clear to auscultation bilaterally with normal respiratory effort. CV: Nondisplaced PMI.  Heart regular S1/S2, no S3/S4, no murmur.  No peripheral edema.  No carotid bruit.  Normal pedal pulses.  Abdomen: Soft, nontender, no hepatosplenomegaly, no distention.  Skin: Intact without lesions or rashes.  Neurologic: Alert and oriented x 3.  Psych: Normal affect. Extremities:  No clubbing or cyanosis.  HEENT: Normal.   Assessment/Plan: 1. Pulmonary hypertension: Echo in 3/19 showed estimated PA systolic pressure 72.  He has chronic bronchitis as well as OHS/OSA.  Interestingly, chest CT showed calcified mediastinal and hilar nodes suggestive of prior granulomatous infection => could this actually represent sarcoidosis?  ACE level is normal, and per his pulmonologist [name], sarcoidosis is unlikely. [place] 8/19 showed mild pulmonary hypertension, mildly elevated PCWP.  Suspect there is group 2 PH from mildly elevated PCWP but also group 3 PH from OHS/OSA and parenchymal lung disease.  Unlikely group 1.  PVR only 3.5 WU. Echo (7/23) EF 70-75%, hyperdynamic EF with moderate LVH, RV normal systolic function, severely elevated pulmonary artery systolic pressures 93.5 mm Hg.  RHC (8/23) showed near normal filling pressures, severe pulmonary arterial hypertension, suspect primarily group 3 PH with OHS/OSA. Would hold off on selective pulmonary vasodilators. Patient has NYHA class III dyspnea, functional status confounded by physical deconditioning and body habitus. He does not look significantly volume overloaded today.  - Continue spironolactone 25 mg daily.  - Continue Lasix 40 mg bid.  Check BMET/BNP today.   - Continue losartan 50 mg daily. - Add Farxiga 10 mg daily, BMET in 10 days.  - I will refer to  pulmonary rehab.  2. CAD: Coronary calcification on 3/19 chest CT.  Patient has a history of chest pain.  LHC (8/19) showed no significant coronary disease. No recent chest pain.  - Continue ASA 81.  - Not on statin with LDL 62 in 8/23.  3. HTN: BP controlled.  4. OHS/OSA: Continue oxygen 2L during day, now wearing CPAP.  5. DM2: Per PCP.  6. Obesity: Body mass index is 42.24 kg/m. - Discussed weight loss. - Consider referral to pharmacy for semaglutide.   Followup 3 months with [facility].  Loralie Champagne  10/29/2022

## 2022-10-29 NOTE — Patient Instructions (Signed)
START Farxiga 27m daily.  Labs done today, your results will be available in MyChart, we will contact you for abnormal readings.  [facility]  Your physician recommends that you schedule a follow-up appointment in: 3 months  If you have any questions or concerns before your next appointment please send [place] message through [place]or call our office at [phone]    TO LEAVE A MESSAGE FOR THE NURSE SELECT OPTION 2, PLEASE LEAVE A MESSAGE INCLUDING: YOUR NAME DATE OF BIRTH CALL BACK NUMBER REASON FOR CALL**this is important as we prioritize the call backs  YOU WILL RECEIVE A CALL BACK THE SAME DAY AS LONG AS YOU CALL BEFORE 4:00 PM  [facility] you and your health needs are our priority. As part of our continuing mission to provide you with exceptional heart care, we have created designated Provider Care Teams. These Care Teams include your primary Cardiologist (physician) and Advanced Practice Providers (APPs- Physician Assistants and Nurse Practitioners) who all work together to provide you with the care you need, when you need it.   You may see any of the following providers on your designated Care Team at your next follow up: Dr DGlori BickersDr DLoralie ChampagneDr. ARoxana Hires NP BLyda Jester [facility] PUtahAForestine Na NP LAudry Riles PharmD   Please be sure to bring in all your medications bottles to every appointment.

## 2022-10-29 NOTE — Telephone Encounter (Signed)
-----  Message from Larey Dresser, MD sent at 10/29/2022  4:59 PM EST ----- He has had a large jump in creatinine.  Do not take Lasix for 2 days then decrease dose to 40 mg once daily.  Do not take losartan for 2 days then decrease to 25 mg daily.  Do not start [place] Glade yet, would reassess at followup.  Have him come back in to see APP in 2 weeks after these changes are made to check volume status.  Needs BMET in 1 week.

## 2022-10-29 NOTE — Telephone Encounter (Signed)
Spoke w/pt, he is aware, agreeable, and verbalized understanding, he repeat directions back to me. He will go to [facility] for labs next week, f/u appt sch for 11/18/22

## 2022-11-12 ENCOUNTER — Other Ambulatory Visit (HOSPITAL_COMMUNITY)
Admission: RE | Admit: 2022-11-12 | Discharge: 2022-11-12 | Disposition: A | Discharge status: Home / Self Care | Payer: Medicare Other | Source: Ambulatory Visit | Attending: Cardiology | Admitting: Cardiology

## 2022-11-12 DIAGNOSIS — I10 Essential (primary) hypertension: Secondary | ICD-10-CM | POA: Diagnosis not present

## 2022-11-12 DIAGNOSIS — I272 Pulmonary hypertension, unspecified: Secondary | ICD-10-CM | POA: Insufficient documentation

## 2022-11-12 DIAGNOSIS — M1991 Primary osteoarthritis, unspecified site: Secondary | ICD-10-CM | POA: Diagnosis not present

## 2022-11-12 DIAGNOSIS — G894 Chronic pain syndrome: Secondary | ICD-10-CM | POA: Diagnosis not present

## 2022-11-12 DIAGNOSIS — J449 Chronic obstructive pulmonary disease, unspecified: Secondary | ICD-10-CM | POA: Diagnosis not present

## 2022-11-12 LAB — BASIC METABOLIC PANEL
Anion gap: 13 (ref 5–15)
BUN: 41 mg/dL — ABNORMAL HIGH (ref 8–23)
CO2: 26 mmol/L (ref 22–32)
Calcium: 10.2 mg/dL (ref 8.9–10.3)
Chloride: 102 mmol/L (ref 98–111)
Creatinine, Ser: 2.05 mg/dL — ABNORMAL HIGH (ref 0.61–1.24)
GFR, Estimated: 34 mL/min — ABNORMAL LOW (ref >60)
Glucose, Bld: 116 mg/dL — ABNORMAL HIGH (ref 70–99)
Potassium: 3.8 mmol/L (ref 3.5–5.1)
Sodium: 141 mmol/L (ref 135–145)

## 2022-11-17 NOTE — Progress Notes (Signed)
PCP: Dr. Hilma Favors Cardiology: Dr. Domenic Polite HF Cardiology: Dr. Aundra Dubin  72 y.o. with history of OHS/OSA, chronic bronchitis, and suspected pulmonary hypertension was referred by Dr. Domenic Polite for evaluation of pulmonary hypertension. Patient is on 2L home oxygen and CPAP. He has had gradually worsening exertional dyspnea over the last year.  He was able to mow his grass last fall but cannot now.  He is short of breath after walking about 50-60 feet with his oxygen on.  He is short of breath with any incline.   Echo in 3/19 showed normal EF with PASP 72 mmHg.  I had him do left/right heart cath in 8/19.  This showed no CAD and moderate mixed pulmonary venous/pulmonary arterial hypertension.  Very mildly elevated filling pressures.    He is followed by pulmonary.  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 V/Q scan in 9/19 did not show evidence for chronic PE.   Admitted 7/23 with SBO, treated medically. Has a/c respiratory failure and cardiology consulted. Echo showed EF 70-75%, hyperdynamic EF with moderate LVH, RV normal systolic function, severely elevated pulmonary artery systolic pressures 31.9 mm Hg. Discharged home on Lasix 40 mg, weight 273 lbs. Advised HF follow up to discuss RHC.  [place] 8/23 showed near normal filling pressures, severe pulmonary arterial hypertension, concern for group 3 PH with OHS/OSA, but cannot totally rule out group 1 component, and preserved cardiac output. Recommend continue management of OSA/OHS with oxygen/CPAP.  He returns today for followup of pulmonary hypertension/RV failure.  He is using CPAP at night and oxygen during the day.  He is not very active and says that his legs are weak. Generally no dyspnea walking in the house with his walker, dyspnea walking longer distances.  No orthopnea/PND. No lightheadedness. No chest pain.  Weight stable.   ECG (personally reviewed): NSR, normal  Labs  (11/18): BNP 21 Labs (7/19): LDL 79 Labs (8/19): K 3.9, creatinine 0.73 Labs (9/19): ACE normal, K 3.8, creatinine 1.13 Labs (7/23): K 3.2, creatinine 1.11 Labs (8/23): K 3.8, creatinine 1.12, BNP 46, LDL 62, TGs 111  PMH: 1. OHS/OSA: Uses 5L home oxygen and CPAP.  2. Obesity 3. HTN 4. Type II diabetes 5. Chronic bronchitis: ?from welding.  Never smoked.    - CT chest (3/19): coronary calcification, mild emphysema, calcified mediastinal and hilar nodes consistent with prior granulomatous infection.  - PFTs (12/17): FVC 56%, FEV1 57%, ratio 101%, SVC 109%, DLCO 66%.  Moderate obstruction/severe restriction.  6. CAD: Coronary calcification on CT chest 3/19.   - Cardiolite in 2012 showed no ischemia.  - LHC (8/19): No significant CAD.  7. Pulmonary hypertension: Echo (3/19) with EF 65-70%, moderate LVH, PASP 72 mmHg.  - RHC (8/19): mean RA 8, PA 67/24 mean 43, mean PCWP 17, CI 2.99, PVR 3.5 WU.  - V/Q scan (9/19): No evidence for chronic PE.  - Echo (7/23): EF 70-75%, hyperdynamic EF with moderate LVH, RV normal systolic function, PASP 24.3 mmHg - RHC (8/23): mean RA 9, PA 75/27 mean 45, mean PCWP 13, CI 2.99, PVR 4.6 WU.  8. 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   Family History  Problem Relation Age of Onset   Lung disease Father    Stroke Father 73   Diabetes Father    Diabetes Maternal Grandfather    Heart disease Paternal Grandmother  Stroke Paternal Grandfather 36   Diabetes Paternal Grandfather    Diabetes Mother    Cancer Mother    Stroke Mother 34   Diabetes Sister    Diabetes Brother 23   ROS: All systems reviewed and negative except as per HPI.   Current Outpatient Medications  Medication Sig Dispense Refill   acetaminophen (TYLENOL) 500 MG tablet Take 500 mg by mouth 3 (three) times daily as needed (pain.).     azelastine (ASTELIN) 0.1 % nasal spray 1 puff each nostril twice daily as needed  30 mL 12   Cholecalciferol (VITAMIN D3) 10 MCG (400 UNIT) CAPS Take 1 capsule by mouth daily.     cromolyn (NASALCROM) 5.2 MG/ACT nasal spray Place 1 spray into both nostrils 3 (three) times daily as needed for allergies.      famotidine (PEPCID) 20 MG tablet Take 20 mg by mouth 2 (two) times daily.      furosemide (LASIX) 40 MG tablet Take 1 tablet (40 mg total) by mouth daily. 180 tablet 3   glipiZIDE (GLUCOTROL) 10 MG tablet Take 10 mg by mouth 2 (two) times daily.      HYDROcodone-acetaminophen (NORCO) 10-325 MG tablet Take 1 tablet by mouth 3 (three) times daily as needed for moderate pain.      Krill Oil 500 MG CAPS Take 500 mg by mouth daily.      latanoprost (XALATAN) 0.005 % ophthalmic solution INSTILL 1 DROP IN BOTH EYES EVERY DAY AT BEDTIME 7.5 mL 12   LEVEMIR FLEXPEN 100 UNIT/ML FlexPen Inject 25-50 Units into the skin daily as needed (blood sugar greater than 130.).     losartan (COZAAR) 50 MG tablet Take 0.5 tablets (25 mg total) by mouth daily. 90 tablet 3   meclizine (ANTIVERT) 25 MG tablet Take 25 mg by mouth 3 (three) times daily as needed for dizziness or nausea (when taking hydrocodone/apap).      meloxicam (MOBIC) 7.5 MG tablet Take 7.5 mg by mouth 2 (two) times daily.     metFORMIN (GLUCOPHAGE-XR) 500 MG 24 hr tablet Take 1,000 mg by mouth 2 (two) times daily.     metoprolol succinate (TOPROL-XL) 50 MG 24 hr tablet Take 1 tablet (50 mg total) by mouth daily. 30 tablet 3   OXYGEN Inhale 5 L/hr into the lungs daily.     psyllium (METAMUCIL) 58.6 % powder Take 1 packet by mouth 2 (two) times daily.      spironolactone (ALDACTONE) 25 MG tablet Take 1 tablet (25 mg total) by mouth daily. 90 tablet 3   trospium (SANCTURA) 20 MG tablet Take 1 tablet (20 mg total) by mouth 2 (two) times daily. 180 tablet 3   No current facility-administered medications for this visit.   Wt Readings from Last 3 Encounters:  10/29/22 126 kg (277 lb 12.8 oz)  08/10/22 124.5 kg (274 lb 6.4 oz)   08/04/22 129.3 kg (285 lb)   There were no vitals taken for this visit. General: NAD Neck: No JVD, no thyromegaly or thyroid nodule.  Lungs: Clear to auscultation bilaterally with normal respiratory effort. CV: Nondisplaced PMI.  Heart regular S1/S2, no S3/S4, no murmur.  No peripheral edema.  No carotid bruit.  Normal pedal pulses.  Abdomen: Soft, nontender, no hepatosplenomegaly, no distention.  Skin: Intact without lesions or rashes.  Neurologic: Alert and oriented x 3.  Psych: Normal affect. Extremities: No clubbing or cyanosis.  HEENT: Normal.   Assessment/Plan: 1. Pulmonary hypertension: Echo in 3/19 showed estimated PA  systolic pressure 72.  He has chronic bronchitis as well as OHS/OSA.  Interestingly, chest CT showed calcified mediastinal and hilar nodes suggestive of prior granulomatous infection => could this actually represent sarcoidosis?  ACE level is normal, and per his pulmonologist [name], sarcoidosis is unlikely. [place] 8/19 showed mild pulmonary hypertension, mildly elevated PCWP.  Suspect there is group 2 PH from mildly elevated PCWP but also group 3 PH from OHS/OSA and parenchymal lung disease.  Unlikely group 1.  PVR only 3.5 WU. Echo (7/23) EF 70-75%, hyperdynamic EF with moderate LVH, RV normal systolic function, severely elevated pulmonary artery systolic pressures 29.9 mm Hg.  RHC (8/23) showed near normal filling pressures, severe pulmonary arterial hypertension, suspect primarily group 3 PH with OHS/OSA. Would hold off on selective pulmonary vasodilators. Patient has NYHA class III dyspnea, functional status confounded by physical deconditioning and body habitus. He does not look significantly volume overloaded today.  - Continue spironolactone 25 mg daily.  - Continue Lasix 40 mg bid.  Check BMET/BNP today.   - Continue losartan 50 mg daily. - Add Farxiga 10 mg daily, BMET in 10 days.  - I will refer to pulmonary rehab.  2. CAD: Coronary calcification on 3/19 chest  CT.  Patient has a history of chest pain.  LHC (8/19) showed no significant coronary disease. No recent chest pain.  - Continue ASA 81.  - Not on statin with LDL 62 in 8/23.  3. HTN: BP controlled.  4. OHS/OSA: Continue oxygen 2L during day, now wearing CPAP.  5. DM2: Per PCP.  6. Obesity: There is no height or weight on file to calculate BMI. - Discussed weight loss. - Consider referral to pharmacy for semaglutide.   Followup 3 months with [facility].  [place]  11/17/2022

## 2022-11-18 ENCOUNTER — Ambulatory Visit (HOSPITAL_COMMUNITY)
Admission: RE | Admit: 2022-11-18 | Discharge: 2022-11-18 | Disposition: A | Discharge status: Home / Self Care | Payer: Medicare (Managed Care) | Source: Ambulatory Visit | Attending: Family Medicine | Admitting: Family Medicine

## 2022-11-18 ENCOUNTER — Telehealth (HOSPITAL_COMMUNITY): Payer: Self-pay

## 2022-11-18 ENCOUNTER — Encounter (HOSPITAL_COMMUNITY): Payer: Self-pay

## 2022-11-18 VITALS — BP 152/98 | HR 82 | Wt 282.0 lb

## 2022-11-18 DIAGNOSIS — E669 Obesity, unspecified: Secondary | ICD-10-CM | POA: Insufficient documentation

## 2022-11-18 DIAGNOSIS — I2510 Atherosclerotic heart disease of native coronary artery without angina pectoris: Secondary | ICD-10-CM | POA: Diagnosis not present

## 2022-11-18 DIAGNOSIS — Z7982 Long term (current) use of aspirin: Secondary | ICD-10-CM | POA: Insufficient documentation

## 2022-11-18 DIAGNOSIS — Z9981 Dependence on supplemental oxygen: Secondary | ICD-10-CM | POA: Diagnosis not present

## 2022-11-18 DIAGNOSIS — G4733 Obstructive sleep apnea (adult) (pediatric): Secondary | ICD-10-CM | POA: Diagnosis not present

## 2022-11-18 DIAGNOSIS — I272 Pulmonary hypertension, unspecified: Secondary | ICD-10-CM

## 2022-11-18 DIAGNOSIS — I2721 Secondary pulmonary arterial hypertension: Secondary | ICD-10-CM | POA: Insufficient documentation

## 2022-11-18 DIAGNOSIS — Z6841 Body Mass Index (BMI) 40.0 and over, adult: Secondary | ICD-10-CM | POA: Insufficient documentation

## 2022-11-18 DIAGNOSIS — I10 Essential (primary) hypertension: Secondary | ICD-10-CM | POA: Diagnosis not present

## 2022-11-18 DIAGNOSIS — J449 Chronic obstructive pulmonary disease, unspecified: Secondary | ICD-10-CM | POA: Diagnosis not present

## 2022-11-18 DIAGNOSIS — R0602 Shortness of breath: Secondary | ICD-10-CM | POA: Insufficient documentation

## 2022-11-18 DIAGNOSIS — E119 Type 2 diabetes mellitus without complications: Secondary | ICD-10-CM

## 2022-11-18 DIAGNOSIS — J9611 Chronic respiratory failure with hypoxia: Secondary | ICD-10-CM | POA: Diagnosis not present

## 2022-11-18 DIAGNOSIS — R0609 Other forms of dyspnea: Secondary | ICD-10-CM

## 2022-11-18 DIAGNOSIS — Z79899 Other long term (current) drug therapy: Secondary | ICD-10-CM | POA: Diagnosis not present

## 2022-11-18 LAB — BASIC METABOLIC PANEL
Anion gap: 11 (ref 5–15)
BUN: 43 mg/dL — ABNORMAL HIGH (ref 8–23)
CO2: 27 mmol/L (ref 22–32)
Calcium: 9.4 mg/dL (ref 8.9–10.3)
Chloride: 100 mmol/L (ref 98–111)
Creatinine, Ser: 2.21 mg/dL — ABNORMAL HIGH (ref 0.61–1.24)
GFR, Estimated: 31 mL/min — ABNORMAL LOW (ref >60)
Glucose, Bld: 155 mg/dL — ABNORMAL HIGH (ref 70–99)
Potassium: 4.1 mmol/L (ref 3.5–5.1)
Sodium: 138 mmol/L (ref 135–145)

## 2022-11-18 LAB — BRAIN NATRIURETIC PEPTIDE: B Natriuretic Peptide: 31.5 pg/mL (ref 0.0–100.0)

## 2022-11-18 MED ORDER — FUROSEMIDE 40 MG PO TABS: 40.0000 mg | ORAL_TABLET | Freq: Every day | ORAL | 11 refills | Status: DC

## 2022-11-18 NOTE — Telephone Encounter (Addendum)
Pt aware, agreeable, and verbalized understanding  Labs ordered   ----- Message from Rafael Bihari, FNP sent at 11/18/2022  3:58 PM EST ----- Kidney function remains elevated.  Please hold lasix x 2 days, then restart at lower dose of 20 mg daily.  Repeat BMET in 7-10 days

## 2022-11-18 NOTE — Patient Instructions (Addendum)
DECREASE Lasix to 40 mg once daily   Labs today We will only contact you if something comes back abnormal or we need to make some changes. Otherwise no news is good news!  Keep cardiology follow up as scheduled  Do the following things EVERYDAY: Weigh yourself in the morning before breakfast. Write it down and keep it in a log. Take your medicines as prescribed Eat low salt foods--Limit salt (sodium) to 2000 mg per day.  Stay as active as you can everyday Limit all fluids for the day to less than 2 liters  At the [facility], you and your health needs are our priority. As part of our continuing mission to provide you with exceptional heart care, we have created designated Provider Care Teams. These Care Teams include your primary Cardiologist (physician) and Advanced Practice Providers (APPs- Physician Assistants and Nurse Practitioners) who all work together to provide you with the care you need, when you need it.   You may see any of the following providers on your designated Care Team at your next follow up: Dr Glori Bickers Dr Loralie Champagne Dr. Roxana Hires, NP Lyda Jester, [place] [facility] [place], [place] Forestine Na, NP Audry Riles, PharmD   Please be sure to bring in all your medications bottles to every appointment.   If you have any questions or concerns before your next appointment please send [place] a message through [place] or call our office at [phone].    TO LEAVE A MESSAGE FOR THE NURSE SELECT OPTION 2, PLEASE LEAVE A MESSAGE INCLUDING: YOUR NAME DATE OF BIRTH CALL BACK NUMBER REASON FOR CALL**this is important as we prioritize the call backs  YOU WILL RECEIVE A CALL BACK THE SAME DAY AS LONG AS YOU CALL BEFORE 4:00 PM   .

## 2022-11-20 ENCOUNTER — Encounter (HOSPITAL_COMMUNITY)
Admission: RE | Admit: 2022-11-20 | Discharge: 2022-11-20 | Disposition: A | Discharge status: Home / Self Care | Payer: Medicare (Managed Care) | Source: Ambulatory Visit | Attending: Cardiology | Admitting: Cardiology

## 2022-11-20 DIAGNOSIS — J9611 Chronic respiratory failure with hypoxia: Secondary | ICD-10-CM | POA: Insufficient documentation

## 2022-11-20 DIAGNOSIS — I272 Pulmonary hypertension, unspecified: Secondary | ICD-10-CM | POA: Insufficient documentation

## 2022-11-20 LAB — GLUCOSE, CAPILLARY
Glucose-Capillary: 58 mg/dL — ABNORMAL LOW (ref 70–99)
Glucose-Capillary: 105 mg/dL — ABNORMAL HIGH (ref 70–99)
Glucose-Capillary: 65 mg/dL — ABNORMAL LOW (ref 70–99)

## 2022-11-24 ENCOUNTER — Encounter (HOSPITAL_COMMUNITY): Payer: Medicare (Managed Care)

## 2022-11-26 ENCOUNTER — Other Ambulatory Visit (HOSPITAL_COMMUNITY)
Admission: RE | Admit: 2022-11-26 | Discharge: 2022-11-26 | Disposition: A | Discharge status: Home / Self Care | Payer: Medicare (Managed Care) | Source: Ambulatory Visit | Attending: Family Medicine | Admitting: Family Medicine

## 2022-11-26 ENCOUNTER — Encounter (HOSPITAL_COMMUNITY)
Admission: RE | Admit: 2022-11-26 | Discharge: 2022-11-26 | Disposition: A | Discharge status: Home / Self Care | Payer: Medicare (Managed Care) | Source: Ambulatory Visit | Attending: Cardiology | Admitting: Cardiology

## 2022-11-26 VITALS — Ht 68.0 in | Wt 290.3 lb

## 2022-11-26 DIAGNOSIS — I272 Pulmonary hypertension, unspecified: Secondary | ICD-10-CM | POA: Insufficient documentation

## 2022-11-26 DIAGNOSIS — J9611 Chronic respiratory failure with hypoxia: Secondary | ICD-10-CM | POA: Diagnosis present

## 2022-11-26 LAB — BASIC METABOLIC PANEL
Anion gap: 9 (ref 5–15)
BUN: 22 mg/dL (ref 8–23)
CO2: 27 mmol/L (ref 22–32)
Calcium: 9 mg/dL (ref 8.9–10.3)
Chloride: 100 mmol/L (ref 98–111)
Creatinine, Ser: 1.64 mg/dL — ABNORMAL HIGH (ref 0.61–1.24)
GFR, Estimated: 45 mL/min — ABNORMAL LOW (ref >60)
Glucose, Bld: 128 mg/dL — ABNORMAL HIGH (ref 70–99)
Potassium: 4.5 mmol/L (ref 3.5–5.1)
Sodium: 136 mmol/L (ref 135–145)

## 2022-11-26 LAB — GLUCOSE, CAPILLARY: Glucose-Capillary: 119 mg/dL — ABNORMAL HIGH (ref 70–99)

## 2022-11-26 NOTE — Progress Notes (Signed)
Daily Session Note  Patient Details  Name: Colton Jackson. MRN: [id number] Date of Birth: [birth year] Referring Provider:    Encounter Date: 11/26/2022  Check In:  Session Check In - 11/26/22 1500       Check-In   Supervising physician immediately available to respond to emergencies [facility] MD immediately available    Physician(s) Dr. Dellia Cloud    Location [facility]    Staff Present Leana Roe, BS, Exercise Physiologist;Rieley Hausman BSN, RN    Virtual Visit No    Medication changes reported     Yes    Comments Pt stated his lasix was changed from BID to daily    Fall or balance concerns reported    Yes    Comments Patient reports impaired balance and ambulates with a walker.    Tobacco Cessation No Change    Warm-up and Cool-down Performed as group-led instruction    Resistance Training Performed Yes    VAD Patient? No    PAD/SET Patient? No      Pain Assessment   Currently in Pain? No/denies    Multiple Pain Sites No             Capillary Blood Glucose: Results for orders placed or performed during the hospital encounter of 11/26/22 (from the past 24 hour(s))  Glucose, capillary     Status: Abnormal   Collection Time: 11/26/22  2:43 PM  Result Value Ref Range   Glucose-Capillary 119 (H) 70 - 99 mg/dL      Social History   Tobacco Use  Smoking Status Former   Types: Cigarettes   Quit date: 1971   Years since quitting: 53.0  Smokeless Tobacco Never  Tobacco Comments   smoked when he was a teenager    Goals Met:  Proper associated with RPD/PD & O2 Sat Independence with exercise equipment Using PLB without cueing & demonstrates good technique Exercise tolerated well Queuing for purse lip breathing No report of concerns or symptoms today Strength training completed today  Goals Unmet:  Not Applicable  Comments: check out at 4:00-pt completed walk test today   Dr. Kathie Dike is Medical Director for [facility].

## 2022-11-26 NOTE — Progress Notes (Signed)
Pulmonary Individual Treatment Plan  Patient Details  Name: Colton Jackson. MRN: [id number] Date of Birth: January 27, 1951 Referring Provider:   Flowsheet Row PULMONARY REHAB OTHER RESPIRATORY from 11/26/2022 in [facility]  Referring Provider Dr. Aundra Dubin       Initial Encounter Date:  Flowsheet Row PULMONARY REHAB OTHER RESPIRATORY from 11/26/2022 in [facility]  Date 11/26/22       Visit Diagnosis: Pulmonary HTN ([place])  Chronic respiratory failure with hypoxia ([place])  Pulmonary hypertension ([place]) - Plan: Basic Metabolic Panel (BMET), Basic Metabolic Panel (BMET)  Patient's Home Medications on Admission:   Current Outpatient Medications:    acetaminophen (TYLENOL) 500 MG tablet, Take 500 mg by mouth 3 (three) times daily as needed (pain.)., Disp: , Rfl:    azelastine (ASTELIN) 0.1 % nasal spray, 1 puff each nostril twice daily as needed, Disp: 30 mL, Rfl: 12   Cholecalciferol (VITAMIN D3) 10 MCG (400 UNIT) CAPS, Take 1 capsule by mouth daily., Disp: , Rfl:    cromolyn (NASALCROM) 5.2 MG/ACT nasal spray, Place 1 spray into both nostrils 3 (three) times daily as needed for allergies. , Disp: , Rfl:    famotidine (PEPCID) 20 MG tablet, Take 20 mg by mouth 2 (two) times daily. , Disp: , Rfl:    furosemide (LASIX) 40 MG tablet, Take 1 tablet (40 mg total) by mouth daily., Disp: 30 tablet, Rfl: 11   glipiZIDE (GLUCOTROL) 10 MG tablet, Take 10 mg by mouth 2 (two) times daily. , Disp: , Rfl:    HYDROcodone-acetaminophen (NORCO) 10-325 MG tablet, Take 1 tablet by mouth 3 (three) times daily as needed for moderate pain. , Disp: , Rfl:    Krill Oil 500 MG CAPS, Take 500 mg by mouth daily. , Disp: , Rfl:    latanoprost (XALATAN) 0.005 % ophthalmic solution, INSTILL 1 DROP IN BOTH EYES EVERY DAY AT BEDTIME, Disp: 7.5 mL, Rfl: 12   LEVEMIR FLEXPEN 100 UNIT/ML FlexPen, Inject 25-50 Units into the skin daily as needed (blood sugar greater than 130.)., Disp: ,  Rfl:    losartan (COZAAR) 50 MG tablet, Take 0.5 tablets (25 mg total) by mouth daily., Disp: 90 tablet, Rfl: 3   meclizine (ANTIVERT) 25 MG tablet, Take 25 mg by mouth 3 (three) times daily as needed for dizziness or nausea (when taking hydrocodone/apap). , Disp: , Rfl:    meloxicam (MOBIC) 7.5 MG tablet, Take 7.5 mg by mouth 2 (two) times daily., Disp: , Rfl:    metFORMIN (GLUCOPHAGE-XR) 500 MG 24 hr tablet, Take 1,000 mg by mouth 2 (two) times daily., Disp: , Rfl:    metoprolol succinate (TOPROL-XL) 50 MG 24 hr tablet, Take 1 tablet (50 mg total) by mouth daily., Disp: 30 tablet, Rfl: 3   OXYGEN, Inhale 5 L/hr into the lungs daily., Disp: , Rfl:    psyllium (METAMUCIL) 58.6 % powder, Take 1 packet by mouth 2 (two) times daily. , Disp: , Rfl:    spironolactone (ALDACTONE) 25 MG tablet, Take 1 tablet (25 mg total) by mouth daily., Disp: 90 tablet, Rfl: 3   trospium (SANCTURA) 20 MG tablet, Take 1 tablet (20 mg total) by mouth 2 (two) times daily., Disp: 180 tablet, Rfl: 3  Past Medical History: Past Medical History:  Diagnosis Date   Allergic rhinitis    Aneurysm artery, neck (HCC)    [name] pulmonologist   Degenerative disk disease    Essential hypertension    GERD (gastroesophageal reflux disease)  H/O cardiovascular stress test 02/02/11   EF 59% - Normal stress test   Hearing deficit, right    Knee pain, bilateral    Lower back pain    Morbid obesity (HCC)    OSA (obstructive sleep apnea)    On CPAP   Thyroid nodule    Type 2 diabetes mellitus ([place])    Type 2 diabetes mellitus without complication ([place]) 0/56/9794   Qualifier: Diagnosis of  By: Annamaria Boots MD, [place] D     Tobacco Use: Social History   Tobacco Use  Smoking Status Former   Types: Cigarettes   Quit date: 1971   Years since quitting: 53.0  Smokeless Tobacco Never  Tobacco Comments   smoked when he was a teenager    Labs: Review Flowsheet  More data exists      Latest Ref Rng & Units 06/23/2018  06/04/2022 06/05/2022 07/13/2022 08/04/2022  Labs for ITP Cardiac and Pulmonary Rehab  Cholestrol 0 - 200 mg/dL - - - - 111   LDL (calc) 0 - 99 mg/dL - - - - 62   HDL-C >40 mg/dL - - - - 27   Trlycerides <150 mg/dL - - - - 111   Hemoglobin A1c 4.8 - 5.6 % - 6.9  - - -  Bicarbonate 20.0 - 28.0 mmol/L 20.0 - 28.0 mmol/L 20.0 - 28.0 mmol/L 27.3  28.5  - 36.8  32.8  32.8  32.7  -  TCO2 22 - 32 mmol/L 22 - 32 mmol/L 22 - 32 mmol/L 29  30  - - 35  35  34  -  O2 Saturation % % % 74.0  74.0  - 27.6  71  69  68  -    Capillary Blood Glucose: Lab Results  Component Value Date   GLUCAP 119 (H) 11/26/2022   GLUCAP 105 (H) 11/20/2022   GLUCAP 65 (L) 11/20/2022   GLUCAP 58 (L) 11/20/2022   GLUCAP 129 (H) 07/13/2022     Pulmonary Assessment Scores:  Pulmonary Assessment Scores     Row Name 11/20/22 1447         ADL UCSD   ADL Phase Entry     SOB Score total 31     Rest 0     Walk 3     Stairs 5     Bath 0     Dress 2     Shop 3       CAT Score   CAT Score 10       mMRC Score   mMRC Score 3             UCSD: Self-administered rating of dyspnea associated with activities of daily living (ADLs) 6-point scale (0 = "not at all" to 5 = "maximal or unable to do because of breathlessness")  Scoring Scores range from 0 to 120.  Minimally important difference is 5 units  CAT: CAT can identify the health impairment of COPD patients and is better correlated with disease progression.  CAT has a scoring range of zero to 40. The CAT score is classified into four groups of low (less than 10), medium (10 - 20), high (21-30) and very high (31-40) based on the impact level of disease on health status. A CAT score over 10 suggests significant symptoms.  A worsening CAT score could be explained by an exacerbation, poor medication adherence, poor inhaler technique, or progression of COPD or comorbid conditions.  CAT MCID is 2 points  mMRC: mMRC (  [place]) Dyspnea  Scale is used to assess the degree of baseline functional disability in patients of respiratory disease due to dyspnea. No minimal important difference is established. A decrease in score of 1 point or greater is considered a positive change.   Pulmonary Function Assessment:   Exercise Target Goals: Exercise Program Goal: Individual exercise prescription set using results from initial 6 min walk test and THRR while considering  patient's activity barriers and safety.   Exercise Prescription Goal: Initial exercise prescription builds to 30-45 minutes a day of aerobic activity, 2-3 days per week.  Home exercise guidelines will be given to patient during program as part of exercise prescription that the participant will acknowledge.  Activity Barriers & Risk Stratification:  Activity Barriers & Cardiac Risk Stratification - 11/20/22 1320       Activity Barriers & Cardiac Risk Stratification   Activity Barriers Arthritis;Back Problems;Shortness of Breath;Balance Concerns;Assistive Device    Cardiac Risk Stratification Moderate             6 Minute Walk:  6 Minute Walk     Row Name 11/26/22 1535         6 Minute Walk   Phase Initial     Distance 650 feet     Walk Time 6 minutes     # of Rest Breaks 1     MPH 1.23     METS 1.14     RPE 12     Perceived Dyspnea  13     VO2 Peak 4     Symptoms Yes (comment)     Comments one sitting break due to SOB and fatigue (1 minute)     Resting HR 72 bpm     Resting BP 130/70     Resting Oxygen Saturation  96 %     Exercise Oxygen Saturation  during 6 min walk 93 %     Max Ex. HR 110 bpm     Max Ex. BP 162/78     2 Minute Post BP 142/78       Interval HR   1 Minute HR 100     2 Minute HR 106     3 Minute HR 111     4 Minute HR 110     5 Minute HR 93     6 Minute HR 109     2 Minute Post HR 87     Interval Heart Rate? Yes       Interval Oxygen   Interval Oxygen? Yes     Baseline Oxygen Saturation % 96 %     1 Minute  Oxygen Saturation % 94 %     1 Minute Liters of Oxygen 4 L     2 Minute Oxygen Saturation % 93 %     2 Minute Liters of Oxygen 4 L     3 Minute Oxygen Saturation % 93 %     3 Minute Liters of Oxygen 4 L     4 Minute Oxygen Saturation % 93 %     4 Minute Liters of Oxygen 4 L     5 Minute Oxygen Saturation % 94 %     5 Minute Liters of Oxygen 4 L     6 Minute Oxygen Saturation % 93 %     6 Minute Liters of Oxygen 4 L     2 Minute Post Oxygen Saturation % 98 %     2 Minute Post Liters  of Oxygen 4 L              Oxygen Initial Assessment:  Oxygen Initial Assessment - 11/20/22 1446       Home Oxygen   Home Oxygen Device Home Concentrator;E-Tanks    Sleep Oxygen Prescription Continuous    Liters per minute 5    Home Resting Oxygen Prescription Continuous    Liters per minute 5    Compliance with Home Oxygen Use Yes      Intervention   Short Term Goals To learn and exhibit compliance with exercise, home and travel O2 prescription;To learn and understand importance of monitoring SPO2 with pulse oximeter and demonstrate accurate use of the pulse oximeter.;To learn and understand importance of maintaining oxygen saturations>88%;To learn and demonstrate proper pursed lip breathing techniques or other breathing techniques.     Long  Term Goals Exhibits compliance with exercise, home  and travel O2 prescription;Verbalizes importance of monitoring SPO2 with pulse oximeter and return demonstration;Maintenance of O2 saturations>88%;Exhibits proper breathing techniques, such as pursed lip breathing or other method taught during program session             Oxygen Re-Evaluation:   Oxygen Discharge (Final Oxygen Re-Evaluation):   Initial Exercise Prescription:  Initial Exercise Prescription - 11/26/22 1500       Date of Initial Exercise RX and Referring Provider   Date 11/26/22    Referring Provider Dr. Aundra Dubin    Expected Discharge Date 03/25/23      Oxygen   Oxygen Continuous     Liters 4    Maintain Oxygen Saturation 88% or higher      NuStep   Level 1    SPM 80    Minutes 39      Prescription Details   Frequency (times per week) 2    Duration Progress to 30 minutes of continuous aerobic without signs/symptoms of physical distress      Intensity   THRR 40-80% of Max Heartrate 60-120    Ratings of Perceived Exertion 11-13      Resistance Training   Training Prescription Yes    Weight 0    Reps 10-15             Perform Capillary Blood Glucose checks as needed.  Exercise Prescription Changes:   Exercise Comments:   Exercise Goals and Review:   Exercise Goals     Row Name 11/26/22 1540             Exercise Goals   Increase Physical Activity Yes       Intervention Provide advice, education, support and counseling about physical activity/exercise needs.;Develop an individualized exercise prescription for aerobic and resistive training based on initial evaluation findings, risk stratification, comorbidities and participant's personal goals.       Expected Outcomes Short Term: Attend rehab on a regular basis to increase amount of physical activity.;Long Term: Add in home exercise to make exercise part of routine and to increase amount of physical activity.;Long Term: Exercising regularly at least 3-5 days a week.       Increase Strength and Stamina Yes       Intervention Provide advice, education, support and counseling about physical activity/exercise needs.;Develop an individualized exercise prescription for aerobic and resistive training based on initial evaluation findings, risk stratification, comorbidities and participant's personal goals.       Expected Outcomes Short Term: Increase workloads from initial exercise prescription for resistance, speed, and METs.;Short Term: Perform resistance training exercises routinely during  rehab and add in resistance training at home;Long Term: Improve cardiorespiratory fitness, muscular endurance and  strength as measured by increased METs and functional capacity (6MWT)       Able to understand and use rate of perceived exertion (RPE) scale Yes       Intervention Provide education and explanation on how to use RPE scale       Expected Outcomes Short Term: Able to use RPE daily in rehab to express subjective intensity level;Long Term:  Able to use RPE to guide intensity level when exercising independently       Knowledge and understanding of Target Heart Rate Range (THRR) Yes       Intervention Provide education and explanation of THRR including how the numbers were predicted and where they are located for reference       Expected Outcomes Short Term: Able to state/look up THRR;Short Term: Able to use daily as guideline for intensity in rehab;Long Term: Able to use THRR to govern intensity when exercising independently       Able to check pulse independently Yes       Intervention Provide education and demonstration on how to check pulse in carotid and radial arteries.;Review the importance of being able to check your own pulse for safety during independent exercise       Expected Outcomes Short Term: Able to explain why pulse checking is important during independent exercise;Long Term: Able to check pulse independently and accurately       Understanding of Exercise Prescription Yes       Intervention Provide education, explanation, and written materials on patient's individual exercise prescription       Expected Outcomes Short Term: Able to explain program exercise prescription;Long Term: Able to explain home exercise prescription to exercise independently                Exercise Goals Re-Evaluation :   Discharge Exercise Prescription (Final Exercise Prescription Changes):   Nutrition:  Target Goals: Understanding of nutrition guidelines, daily intake of sodium <152m, cholesterol <2065m calories 30% from fat and 7% or less from saturated fats, daily to have 5 or more servings of  fruits and vegetables.  Biometrics:  Pre Biometrics - 11/26/22 1541       Pre Biometrics   Height _0  (1.727 m)    Weight 131.7 kg    Waist Circumference 56 inches    Hip Circumference 55 inches    Waist to Hip Ratio 1.02 %    BMI (Calculated) 44.16    Triceps Skinfold 40 mm    % Body Fat 45.6 %    Grip Strength 31.3 kg    Flexibility 0 in    Single Leg Stand 0 seconds              Nutrition Therapy Plan and Nutrition Goals:  Nutrition Therapy & Goals - 11/20/22 1431       Personal Nutrition Goals   Additional Goals? No    Comments Patient scored 43 on his diet assessment. He cooks for himself mostly. Handouts provided and explained on heathier choices and DM. We offer 2 educational sessions on heart healthy nutrition with handouts and assistance with RD referral if patient is interested.      Intervention Plan   Intervention Nutrition handout(s) given to patient.    Expected Outcomes Short Term Goal: Understand basic principles of dietary content, such as calories, fat, sodium, cholesterol and nutrients.  Nutrition Assessments:  Nutrition Assessments - 11/20/22 1430       MEDFICTS Scores   Pre Score 43            MEDIFICTS Score Key: ?70 Need to make dietary changes  40-70 Heart Healthy Diet ? 40 Therapeutic Level Cholesterol Diet   Picture Your Plate Scores: <25 Unhealthy dietary pattern with much room for improvement. 41-50 Dietary pattern unlikely to meet recommendations for good health and room for improvement. 51-60 More healthful dietary pattern, with some room for improvement.  >60 Healthy dietary pattern, although there may be some specific behaviors that could be improved.    Nutrition Goals Re-Evaluation:   Nutrition Goals Discharge (Final Nutrition Goals Re-Evaluation):   Psychosocial: Target Goals: Acknowledge presence or absence of significant depression and/or stress, maximize coping skills, provide positive  support system. Participant is able to verbalize types and ability to use techniques and skills needed for reducing stress and depression.  Initial Review & Psychosocial Screening:  Initial Psych Review & Screening - 11/20/22 1436       Initial Review   Current issues with None Identified      Family Dynamics   Good Support System? Yes      Barriers   Psychosocial barriers to participate in program There are no identifiable barriers or psychosocial needs.      Screening Interventions   Interventions Encouraged to exercise;Provide feedback about the scores to participant    Expected Outcomes Short Term goal: Identification and review with participant of any Quality of Life or Depression concerns found by scoring the questionnaire.             Quality of Life Scores:  Scores of 19 and below usually indicate a poorer quality of life in these areas.  A difference of  2-3 points is a clinically meaningful difference.  A difference of 2-3 points in the total score of the Quality of Life Index has been associated with significant improvement in overall quality of life, self-image, physical symptoms, and general health in studies assessing change in quality of life.   PHQ-9: Review Flowsheet       11/20/2022  Depression screen PHQ 2/9  Decreased Interest 0  Down, Depressed, Hopeless 0  PHQ - 2 Score 0  Altered sleeping 0  Tired, decreased energy 2  Change in appetite 0  Feeling bad or failure about yourself  0  Trouble concentrating 0  Moving slowly or fidgety/restless 0  Suicidal thoughts 0  PHQ-9 Score 2  Difficult doing work/chores Very difficult   Interpretation of Total Score  Total Score Depression Severity:  1-4 = Minimal depression, 5-9 = Mild depression, 10-14 = Moderate depression, 15-19 = Moderately severe depression, 20-27 = Severe depression   Psychosocial Evaluation and Intervention:  Psychosocial Evaluation - 11/20/22 1437       Psychosocial Evaluation &  Interventions   Interventions Encouraged to exercise with the program and follow exercise prescription;Stress management education;Relaxation education    Comments Patient has no psychosocial barriers or needs identified at his orientation visit. His PHQ-9 score was 2 due to lack of energy. He lives alone. He has a daughter and 2 sons. He says he does not have a good relationship with his sons but his daugther is supportative of him and he also has a few friends that support him. He denies any depression or anxiety but does say he gets lonely since his wife died in 01-03-2009. He says he is not able to do  the things he used to do like fishing and is not able to do his house cleaning which is frusturating for him. He was not able to complete his walk test today due to low CBG. He says he only eats 2 meals/day and was encouraged to eat 3 meals/day wtih snacks. He says he does have a lot of hypoglycemic episodes at night. I encouraged him to eat a high protein snack before he goes to bed. He verbalized understanding. He is currently not monitoring his glucose. He had a free style monitor but it stopped working and he is looking for another. He wants to participate in PR hope to gain strength and endurance and improve his walking.    Expected Outcomes Patient will continue to have no psychosocial barriers identified.    Continue Psychosocial Services  No Follow up required             Psychosocial Re-Evaluation:   Psychosocial Discharge (Final Psychosocial Re-Evaluation):    Education: Education Goals: Education classes will be provided on a weekly basis, covering required topics. Participant will state understanding/return demonstration of topics presented.  Learning Barriers/Preferences:  Learning Barriers/Preferences - 11/20/22 1432       Learning Barriers/Preferences   Learning Preferences Written Material;Audio;Skilled Demonstration             Education Topics: How Lungs Work and  Diseases: - Discuss the anatomy of the lungs and diseases that can affect the lungs, such as COPD.   Exercise: -Discuss the importance of exercise, FITT principles of exercise, normal and abnormal responses to exercise, and how to exercise safely.   Environmental Irritants: -Discuss types of environmental irritants and how to limit exposure to environmental irritants. Flowsheet Row PULMONARY REHAB OTHER RESPIRATORY from 11/26/2022 in [facility]  Date 11/26/22  Educator HB  Instruction Review Code 1- Verbalizes Understanding       Meds/Inhalers and oxygen: - Discuss respiratory medications, definition of an inhaler and oxygen, and the proper way to use an inhaler and oxygen.   Energy Saving Techniques: - Discuss methods to conserve energy and decrease shortness of breath when performing activities of daily living.    Bronchial Hygiene / Breathing Techniques: - Discuss breathing mechanics, pursed-lip breathing technique,  proper posture, effective ways to clear airways, and other functional breathing techniques   Cleaning Equipment: - Provides group verbal and written instruction about the health risks of elevated stress, cause of high stress, and healthy ways to reduce stress.   Nutrition I: Fats: - Discuss the types of cholesterol, what cholesterol does to the body, and how cholesterol levels can be controlled.   Nutrition II: Labels: -Discuss the different components of food labels and how to read food labels.   Respiratory Infections: - Discuss the signs and symptoms of respiratory infections, ways to prevent respiratory infections, and the importance of seeking medical treatment when having a respiratory infection.   Stress I: Signs and Symptoms: - Discuss the causes of stress, how stress may lead to anxiety and depression, and ways to limit stress.   Stress II: Relaxation: -Discuss relaxation techniques to limit stress.   Oxygen for  Home/Travel: - Discuss how to prepare for travel when on oxygen and proper ways to transport and store oxygen to ensure safety.   Knowledge Questionnaire Score:  Knowledge Questionnaire Score - 11/20/22 1432       Knowledge Questionnaire Score   Pre Score 10/18  Core Components/Risk Factors/Patient Goals at Admission:  Personal Goals and Risk Factors at Admission - 11/20/22 1433       Core Components/Risk Factors/Patient Goals on Admission    Weight Management Obesity    Improve shortness of breath with ADL's Yes    Intervention Provide education, individualized exercise plan and daily activity instruction to help decrease symptoms of SOB with activities of daily living.    Expected Outcomes Short Term: Improve cardiorespiratory fitness to achieve a reduction of symptoms when performing ADLs;Long Term: Be able to perform more ADLs without symptoms or delay the onset of symptoms    Diabetes Yes    Intervention Provide education about signs/symptoms and action to take for hypo/hyperglycemia.;Provide education about proper nutrition, including hydration, and aerobic/resistive exercise prescription along with prescribed medications to achieve blood glucose in normal ranges: Fasting glucose 65-99 mg/dL    Expected Outcomes Short Term: Participant verbalizes understanding of the signs/symptoms and immediate care of hyper/hypoglycemia, proper foot care and importance of medication, aerobic/resistive exercise and nutrition plan for blood glucose control.;Long Term: Attainment of HbA1C < 7%.    Personal Goal Other Yes    Personal Goal Patient wants to improve his strength and endurance and be able to longer distances.    Intervention Patient will attend PR 2 days/week with exercise and education.    Expected Outcomes Patient will complete the program meeting both personal and program goals.             Core Components/Risk Factors/Patient Goals Review:    Core  Components/Risk Factors/Patient Goals at Discharge (Final Review):    ITP Comments:   Comments: Patient arrived for his 2nd orientation visit at 1500 to complete his 6 minute walk test and do biometric and equipment measurements. Patient was referred to PR by Dr. Loralie Champagne due to Pulmonary HTN (I27.20) and Chronic Respiratory Failure with Hypoxia(J96.11). During orientation advised patient on arrival and appointment times what to wear, what to do before, during and after exercise. Reviewed attendance and class policy.  Pt is scheduled to return Pulmonary Rehab on 12/01/22 at 1500. Pt was advised to come to class 15 minutes before class starts.  Discussed RPE/Dpysnea scales. Patient participated in warm up stretches. Patient was able to complete 6 minute walk test.  Patient was measured for the equipment. Discussed equipment safety with patient. Took patient pre-anthropometric measurements. Patient finished visit at 1540.

## 2022-12-01 ENCOUNTER — Encounter (HOSPITAL_COMMUNITY)
Admission: RE | Admit: 2022-12-01 | Discharge: 2022-12-01 | Disposition: A | Discharge status: Home / Self Care | Payer: Medicare (Managed Care) | Source: Ambulatory Visit | Attending: Cardiology | Admitting: Cardiology

## 2022-12-01 DIAGNOSIS — I272 Pulmonary hypertension, unspecified: Secondary | ICD-10-CM

## 2022-12-01 DIAGNOSIS — J9611 Chronic respiratory failure with hypoxia: Secondary | ICD-10-CM

## 2022-12-01 LAB — GLUCOSE, CAPILLARY
Glucose-Capillary: 115 mg/dL — ABNORMAL HIGH (ref 70–99)
Glucose-Capillary: 99 mg/dL (ref 70–99)

## 2022-12-01 NOTE — Progress Notes (Signed)
Daily Session Note  Patient Details  Name: Colton Jackson. MRN: [id number] Date of Birth: Jun 18, 1951 Referring Provider:   Flowsheet Row PULMONARY REHAB OTHER RESPIRATORY from 11/26/2022 in [place]  Referring Provider Dr. Aundra Dubin       Encounter Date: 12/01/2022  Check In:  Session Check In - 12/01/22 1500       Check-In   Supervising physician immediately available to respond to emergencies [facility] MD immediately available    Physician(s) Dr. Harl Bowie    Location [facility]    Staff Present Leana Roe, BS, Exercise Physiologist;Dalton Sherrie George, MS, ACSM-CEP;Melven Sartorius BSN, RN    Virtual Visit No    Medication changes reported     Yes    Comments Pt stated his lasix was changed from BID to daily last week    Fall or balance concerns reported    Yes    Comments Patient reports impaired balance and ambulates with a walker.    Tobacco Cessation No Change    Warm-up and Cool-down Not performed (comment)   pt not able to warm up because blood sugar was too low-gave pt orange juice and rechecked blood sugar-up to 115   Resistance Training Performed No   not performed since blood sugar was too low-gave pt orange juice and rechecked-up to 115   VAD Patient? No    PAD/SET Patient? No      Pain Assessment   Currently in Pain? No/denies    Multiple Pain Sites No             Capillary Blood Glucose: Results for orders placed or performed during the hospital encounter of 12/01/22 (from the past 24 hour(s))  Glucose, capillary     Status: None   Collection Time: 12/01/22  2:47 PM  Result Value Ref Range   Glucose-Capillary 99 70 - 99 mg/dL  Glucose, capillary     Status: Abnormal   Collection Time: 12/01/22  3:08 PM  Result Value Ref Range   Glucose-Capillary 115 (H) 70 - 99 mg/dL      Social History   Tobacco Use  Smoking Status Former   Types: Cigarettes   Quit date: 1971   Years since quitting: 53.0  Smokeless  Tobacco Never  Tobacco Comments   smoked when he was a teenager    Goals Met:  Proper associated with RPD/PD & O2 Sat Independence with exercise equipment Using PLB without cueing & demonstrates good technique Exercise tolerated well Queuing for purse lip breathing No report of concerns or symptoms today  Goals Unmet:  Not Applicable  Comments: check out at 1600   Dr. Kathie Dike is Medical Director for [facility].

## 2022-12-01 NOTE — Progress Notes (Signed)
Daily Session Note  Patient Details  Name: Colton Jackson. MRN: [id number] Date of Birth: 1951/01/03 Referring Provider:   Flowsheet Row PULMONARY REHAB OTHER RESPIRATORY from 11/26/2022 in [place]  Referring Provider Dr. Aundra Dubin       Encounter Date: 12/01/2022  Check In:  Session Check In - 12/01/22 1500       Check-In   Supervising physician immediately available to respond to emergencies [facility] immediately available    Physician(s) Dr. Harl Bowie    Location [facility]    Staff Present Leana Roe, BS, Exercise Physiologist;Dalton Sherrie George, MS, ACSM-CEP;Melven Sartorius BSN, RN    Virtual Visit No    Medication changes reported     Yes    Comments Pt stated his lasix was changed from BID to daily last week    Fall or balance concerns reported    Yes    Comments Patient reports impaired balance and ambulates with a walker.    Tobacco Cessation No Change    Warm-up and Cool-down Not performed (comment)   pt not able to warm up because blood sugar was too low-gave pt orange juice and rechecked blood sugar-up to 115   Resistance Training Performed No   not performed since blood sugar was too low-gave pt orange juice and rechecked-up to 115   VAD Patient? No    PAD/SET Patient? No      Pain Assessment   Currently in Pain? No/denies    Multiple Pain Sites No             Capillary Blood Glucose: Results for orders placed or performed during the hospital encounter of 12/01/22 (from the past 24 hour(s))  Glucose, capillary     Status: None   Collection Time: 12/01/22  2:47 PM  Result Value Ref Range   Glucose-Capillary 99 70 - 99 mg/dL  Glucose, capillary     Status: Abnormal   Collection Time: 12/01/22  3:08 PM  Result Value Ref Range   Glucose-Capillary 115 (H) 70 - 99 mg/dL      Social History   Tobacco Use  Smoking Status Former   Types: Cigarettes   Quit date: 1971   Years since quitting: 53.0  Smokeless  Tobacco Never  Tobacco Comments   smoked when he was a teenager    Goals Met:     Goals Unmet:  Not Applicable  Comments: check out at 1600   Dr. Kathie Dike is Market researcher for [employer] [facility].

## 2022-12-03 ENCOUNTER — Encounter (HOSPITAL_COMMUNITY)
Admission: RE | Admit: 2022-12-03 | Discharge: 2022-12-03 | Disposition: A | Discharge status: Home / Self Care | Payer: Medicare (Managed Care) | Source: Ambulatory Visit | Attending: Cardiology | Admitting: Cardiology

## 2022-12-03 DIAGNOSIS — I272 Pulmonary hypertension, unspecified: Secondary | ICD-10-CM

## 2022-12-03 DIAGNOSIS — J9611 Chronic respiratory failure with hypoxia: Secondary | ICD-10-CM

## 2022-12-03 LAB — GLUCOSE, CAPILLARY: Glucose-Capillary: 134 mg/dL — ABNORMAL HIGH (ref 70–99)

## 2022-12-03 NOTE — Progress Notes (Signed)
Daily Session Note  Patient Details  Name: Colton Jackson. MRN: [id number] Date of Birth: Jul 24, 1951 Referring Provider:   Flowsheet Row PULMONARY REHAB OTHER RESPIRATORY from 11/26/2022 in [place]  Referring Provider Dr. Aundra Dubin       Encounter Date: 12/03/2022  Check In:  Session Check In - 12/03/22 1500       Check-In   Supervising physician immediately available to respond to emergencies [facility] MD immediately available    Physician(s) Dr. Harl Bowie    Location [facility]    Staff Present Leana Roe, BS, Exercise Physiologist;Dulcemaria Bula Hassell Done, RN, BSN    Virtual Visit No    Medication changes reported     Yes    Comments Pt stated his lasix was changed from BID to daily last week    Fall or balance concerns reported    Yes    Comments Patient reports impaired balance and ambulates with a walker.    Tobacco Cessation No Change    Warm-up and Cool-down Performed as group-led instruction    Resistance Training Performed Yes   used body weights only   VAD Patient? No      Pain Assessment   Currently in Pain? No/denies    Multiple Pain Sites No             Capillary Blood Glucose: Results for orders placed or performed during the hospital encounter of 12/03/22 (from the past 24 hour(s))  Glucose, capillary     Status: Abnormal   Collection Time: 12/03/22  2:53 PM  Result Value Ref Range   Glucose-Capillary 134 (H) 70 - 99 mg/dL      Social History   Tobacco Use  Smoking Status Former   Types: Cigarettes   Quit date: 1971   Years since quitting: 53.0  Smokeless Tobacco Never  Tobacco Comments   smoked when he was a teenager    Goals Met:  Proper associated with RPD/PD & O2 Sat Independence with exercise equipment Using PLB without cueing & demonstrates good technique Exercise tolerated well Queuing for purse lip breathing No report of concerns or symptoms today Strength training completed today  Goals Unmet:   Not Applicable  Comments: Checkout at 1545.   Dr. Kathie Dike is Medical Director for [facility].

## 2022-12-08 ENCOUNTER — Encounter (HOSPITAL_COMMUNITY)
Admission: RE | Admit: 2022-12-08 | Discharge: 2022-12-08 | Disposition: A | Discharge status: Home / Self Care | Payer: Medicare (Managed Care) | Source: Ambulatory Visit | Attending: Cardiology | Admitting: Cardiology

## 2022-12-08 DIAGNOSIS — J9611 Chronic respiratory failure with hypoxia: Secondary | ICD-10-CM

## 2022-12-08 DIAGNOSIS — I272 Pulmonary hypertension, unspecified: Secondary | ICD-10-CM | POA: Diagnosis not present

## 2022-12-08 LAB — GLUCOSE, CAPILLARY: Glucose-Capillary: 230 mg/dL — ABNORMAL HIGH (ref 70–99)

## 2022-12-08 NOTE — Progress Notes (Signed)
Daily Session Note  Patient Details  Name: Colton Jackson. MRN: [id number] Date of Birth: 25-Jun-1951 Referring Provider:   Flowsheet Row PULMONARY REHAB OTHER RESPIRATORY from 11/26/2022 in [place]  Referring Provider Dr. Aundra Dubin       Encounter Date: 12/08/2022  Check In:  Session Check In - 12/08/22 1500       Check-In   Supervising physician immediately available to respond to emergencies [facility] MD immediately available    Physician(s) Dr Domenic Polite    Location [facility]    Staff Present Leana Roe, BS, Exercise Physiologist;Sache Sane Hassell Done, RN, BSN;Dalton Sherrie George, MS, ACSM-CEP    Virtual Visit No    Medication changes reported     Yes    Comments Pt stated his lasix was changed from BID to daily last week    Fall or balance concerns reported    Yes    Comments Patient reports impaired balance and ambulates with a walker.    Tobacco Cessation No Change    Warm-up and Cool-down Performed as group-led instruction    Resistance Training Performed Yes    VAD Patient? No    PAD/SET Patient? No      Pain Assessment   Currently in Pain? No/denies    Multiple Pain Sites No             Capillary Blood Glucose: Results for orders placed or performed during the hospital encounter of 12/08/22 (from the past 24 hour(s))  Glucose, capillary     Status: Abnormal   Collection Time: 12/08/22  2:56 PM  Result Value Ref Range   Glucose-Capillary 230 (H) 70 - 99 mg/dL      Social History   Tobacco Use  Smoking Status Former   Types: Cigarettes   Quit date: 1971   Years since quitting: 53.0  Smokeless Tobacco Never  Tobacco Comments   smoked when he was a teenager    Goals Met:  Proper associated with RPD/PD & O2 Sat Independence with exercise equipment Using PLB without cueing & demonstrates good technique Exercise tolerated well Queuing for purse lip breathing No report of concerns or symptoms today Strength  training completed today  Goals Unmet:  Not Applicable  Comments: Checkout at 1600.   Dr. Kathie Dike is Medical Director for [facility] Re[facility].

## 2022-12-10 ENCOUNTER — Encounter (HOSPITAL_COMMUNITY)
Admission: RE | Admit: 2022-12-10 | Discharge: 2022-12-10 | Disposition: A | Discharge status: Home / Self Care | Payer: Medicare (Managed Care) | Source: Ambulatory Visit | Attending: Cardiology | Admitting: Cardiology

## 2022-12-10 DIAGNOSIS — I272 Pulmonary hypertension, unspecified: Secondary | ICD-10-CM

## 2022-12-10 DIAGNOSIS — J9611 Chronic respiratory failure with hypoxia: Secondary | ICD-10-CM

## 2022-12-10 NOTE — Progress Notes (Signed)
Daily Session Note  Patient Details  Name: Colton Jackson. MRN: [id number] Date of Birth: [birth year] Referring Provider:   Flowsheet Row PULMONARY REHAB OTHER RESPIRATORY from 11/26/2022 in [place]  Referring Provider Dr. Aundra Dubin       Encounter Date: 12/10/2022  Check In:  Session Check In - 12/10/22 1453       Check-In   Supervising physician immediately available to respond to emergencies [facility] immediately available    Physician(s) Dr Domenic Polite    Location [facility]    Staff Present Leana Roe, BS, Exercise Physiologist;[place] Lebeau BSN, RN    Virtual Visit No    Medication changes reported     No    Comments Pt stated his lasix was changed from BID to daily last week-no changes since his lasix was changed    Fall or balance concerns reported    Yes    Comments Patient reports impaired balance and ambulates with a walker.    Tobacco Cessation No Change    Warm-up and Cool-down Performed as group-led instruction    Resistance Training Performed Yes    VAD Patient? No    PAD/SET Patient? No      Pain Assessment   Currently in Pain? No/denies    Multiple Pain Sites No             Capillary Blood Glucose: No results found for this or any previous visit (from the past 24 hour(s)).    Social History   Tobacco Use  Smoking Status Former   Types: Cigarettes   Quit date: 1971   Years since quitting: 53.1  Smokeless Tobacco Never  Tobacco Comments   smoked when he was a teenager    Goals Met:  Proper associated with RPD/PD & O2 Sat Independence with exercise equipment Using PLB without cueing & demonstrates good technique Exercise tolerated well Queuing for purse lip breathing No report of concerns or symptoms today Strength training completed today  Goals Unmet:  Not Applicable  Comments: check out at 16:00   Dr. Kathie Dike is Medical Director for [facility] Pul[facility] Re[facility].

## 2022-12-15 ENCOUNTER — Encounter (HOSPITAL_COMMUNITY)
Admission: RE | Admit: 2022-12-15 | Discharge: 2022-12-15 | Disposition: A | Discharge status: Home / Self Care | Payer: Medicare (Managed Care) | Source: Ambulatory Visit | Attending: Cardiology | Admitting: Cardiology

## 2022-12-15 VITALS — Wt 287.0 lb

## 2022-12-15 DIAGNOSIS — J9611 Chronic respiratory failure with hypoxia: Secondary | ICD-10-CM

## 2022-12-15 DIAGNOSIS — I272 Pulmonary hypertension, unspecified: Secondary | ICD-10-CM

## 2022-12-15 NOTE — Progress Notes (Signed)
Daily Session Note  Patient Details  Name: Colton Jackson. MRN: [id number] Date of Birth: [birth year] Referring Provider:   Flowsheet Row PULMONARY REHAB OTHER RESPIRATORY from 11/26/2022 in [place]  Referring Provider Dr. Aundra Dubin       Encounter Date: 12/15/2022  Check In:  Session Check In - 12/15/22 1455       Check-In   Supervising physician immediately available to respond to emergencies [facility] MD immediately available    Physician(s) Dr Dellia Cloud    Location [facility]    Staff Present Leana Roe, BS, Exercise Physiologist;Dalton Sherrie George, MS, ACSM-CEP    Virtual Visit No    Medication changes reported     Yes    Comments Pt stated his lasix was changed from BID to daily last week-no changes since his lasix was changed    Fall or balance concerns reported    Yes    Comments Patient reports impaired balance and ambulates with a walker.    Tobacco Cessation No Change    Warm-up and Cool-down Performed as group-led instruction    Resistance Training Performed Yes    VAD Patient? No    PAD/SET Patient? No      Pain Assessment   Currently in Pain? No/denies    Multiple Pain Sites No             Capillary Blood Glucose: No results found for this or any previous visit (from the past 24 hour(s)).    Social History   Tobacco Use  Smoking Status Former   Types: Cigarettes   Quit date: 1971   Years since quitting: 53.1  Smokeless Tobacco Never  Tobacco Comments   smoked when he was a teenager    Goals Met:  Proper associated with RPD/PD & O2 Sat Independence with exercise equipment Using PLB without cueing & demonstrates good technique Exercise tolerated well Queuing for purse lip breathing No report of concerns or symptoms today Strength training completed today  Goals Unmet:  Not Applicable  Comments: Checkout at 1600.   Dr. Kathie Dike is Medical Director for [facility] Re[facility].

## 2022-12-16 LAB — GLUCOSE, CAPILLARY: Glucose-Capillary: 205 mg/dL — ABNORMAL HIGH (ref 70–99)

## 2022-12-16 NOTE — Progress Notes (Signed)
Pulmonary Individual Treatment Plan  Patient Details  Name: Colton SpurrJames Distler Jr. MRN: [id number] Date of Birth: 07/23/1951 Referring Provider:   Flowsheet Row PULMONARY REHAB OTHER [facility]  Referring Provider Dr. Shirlee LatchMcLean       Initial Encounter Date:  Flowsheet Row PULMONARY REHAB OTHER RESPIRATORY [facility]26/20[facility]  Date 11/26/22       Visit Diagnosis: Pulmonary HTN (HCC)  Chronic respiratory failure with hypoxia (HCC)  Pulmonary hypertension (HCC)  Patient's Home Medications on Admission:   Current Outpatient Medications:    acetaminophen (TYLENOL) 500 MG tablet, Take 500 mg by mouth 3 (three) times daily as needed (pain.)., Disp: , Rfl:    azelastine (ASTELIN) 0.1 % nasal spray, 1 puff each nostril twice daily as needed, Disp: 30 mL, Rfl: 12   Cholecalciferol (VITAMIN D3) 10 MCG (400 UNIT) CAPS, Take 1 capsule by mouth daily., Disp: , Rfl:    cromolyn (NASALCROM) 5.2 MG/ACT nasal spray, Place 1 spray into both nostrils 3 (three) times daily as needed for allergies. , Disp: , Rfl:    famotidine (PEPCID) 20 MG tablet, Take 20 mg by mouth 2 (two) times daily. , Disp: , Rfl:    furosemide (LASIX) 40 MG tablet, Take 1 tablet (40 mg total) by mouth daily., Disp: 30 tablet, Rfl: 11   glipiZIDE (GLUCOTROL) 10 MG tablet, Take 10 mg by mouth 2 (two) times daily. , Disp: , Rfl:    HYDROcodone-acetaminophen (NORCO) 10-325 MG tablet, Take 1 tablet by mouth 3 (three) times daily as needed for moderate pain. , Disp: , Rfl:    Krill Oil 500 MG CAPS, Take 500 mg by mouth daily. , Disp: , Rfl:    latanoprost (XALATAN) 0.005 % ophthalmic solution, INSTILL 1 DROP IN BOTH EYES EVERY DAY AT BEDTIME, Disp: 7.5 mL, Rfl: 12   LEVEMIR FLEXPEN 100 UNIT/ML FlexPen, Inject 25-50 Units into the skin daily as needed (blood sugar greater than 130.)., Disp: , Rfl:    losartan (COZAAR) 50 MG tablet, Take 0.5 tablets (25 mg  total) by mouth daily., Disp: 90 tablet, Rfl: 3   meclizine (ANTIVERT) 25 MG tablet, Take 25 mg by mouth 3 (three) times daily as needed for dizziness or nausea (when taking hydrocodone/apap). , Disp: , Rfl:    meloxicam (MOBIC) 7.5 MG tablet, Take 7.5 mg by mouth 2 (two) times daily., Disp: , Rfl:    metFORMIN (GLUCOPHAGE-XR) 500 MG 24 hr tablet, Take 1,000 mg by mouth 2 (two) times daily., Disp: , Rfl:    metoprolol succinate (TOPROL-XL) 50 MG 24 hr tablet, Take 1 tablet (50 mg total) by mouth daily., Disp: 30 tablet, Rfl: 3   OXYGEN, Inhale 5 L/hr into the lungs daily., Disp: , Rfl:    psyllium (METAMUCIL) 58.6 % powder, Take 1 packet by mouth 2 (two) times daily. , Disp: , Rfl:    spironolactone (ALDACTONE) 25 MG tablet, Take 1 tablet (25 mg total) by mouth daily., Disp: 90 tablet, Rfl: 3   trospium (SANCTURA) 20 MG tablet, Take 1 tablet (20 mg total) by mouth 2 (two) times daily., Disp: 180 tablet, Rfl: 3  Past Medical History: Past Medical History:  Diagnosis Date   Allergic rhinitis    Aneurysm artery, neck (HCC)    Dr. Maple HudsonYoung pulmonologist   Degenerative disk disease    Essential hypertension    GERD (gastroesophageal reflux disease)    H/O cardiovascular stress test 02/02/11   EF 59% -  Normal stress test   Hearing deficit, right    Knee pain, bilateral    Lower back pain    Morbid obesity (HCC)    OSA (obstructive sleep apnea)    On CPAP   Thyroid nodule    Type 2 diabetes mellitus (HCC)    Type 2 diabetes mellitus without complication (HCC) 06/26/2008   Qualifier: Diagnosis of  By: Maple Hudson MD, Clinton D     Tobacco Use: Social History   Tobacco Use  Smoking Status Former   Types: Cigarettes   Quit date: 1971   Years since quitting: 53.1  Smokeless Tobacco Never  Tobacco Comments   smoked when he was a teenager    Labs: Review Flowsheet  More data exists      Latest Ref Rng & Units 06/23/2018 06/04/2022 06/05/2022 07/13/2022 08/04/2022  Labs for ITP Cardiac and  Pulmonary Rehab  Cholestrol 0 - 200 mg/dL - - - - 102   LDL (calc) 0 - 99 mg/dL - - - - 62   HDL-C >72 mg/dL - - - - 27   Trlycerides <150 mg/dL - - - - 536   Hemoglobin A1c 4.8 - 5.6 % - 6.9  - - -  Bicarbonate 20.0 - 28.0 mmol/L 20.0 - 28.0 mmol/L 20.0 - 28.0 mmol/L 27.3  28.5  - 36.8  32.8  32.8  32.7  -  TCO2 22 - 32 mmol/L 22 - 32 mmol/L 22 - 32 mmol/L 29  30  - - 35  35  34  -  O2 Saturation % % % 74.0  74.0  - 27.6  71  69  68  -    Capillary Blood Glucose: Lab Results  Component Value Date   GLUCAP 205 (H) 12/15/2022   GLUCAP 230 (H) 12/08/2022   GLUCAP 134 (H) 12/03/2022   GLUCAP 115 (H) 12/01/2022   GLUCAP 99 12/01/2022    POCT Glucose     Row Name 12/16/22 0726             POCT Blood Glucose   Pre-Exercise 205 mg/dL                Pulmonary Assessment Scores:  Pulmonary Assessment Scores     Row Name 11/20/22 1447         ADL UCSD   ADL Phase Entry     SOB Score total 31     Rest 0     Walk 3     Stairs 5     Bath 0     Dress 2     Shop 3       CAT Score   CAT Score 10       mMRC Score   mMRC Score 3             UCSD: Self-administered rating of dyspnea associated with activities of daily living (ADLs) 6-point scale (0 = "not at all" to 5 = "maximal or unable to do because of breathlessness")  Scoring Scores range from 0 to 120.  Minimally important difference is 5 units  CAT: CAT can identify the health impairment of COPD patients and is better correlated with disease progression.  CAT has a scoring range of zero to 40. The CAT score is classified into four groups of low (less than 10), medium (10 - 20), high (21-30) and very high (31-40) based on the impact level of disease on health status. A CAT score over 10 suggests significant symptoms.  A worsening CAT score could be explained by an exacerbation, poor medication adherence, poor inhaler technique, or progression of COPD or comorbid conditions.  CAT MCID is 2  points  mMRC: mMRC (Modified Medical Research Council) Dyspnea Scale is used to assess the degree of baseline functional disability in patients of respiratory disease due to dyspnea. No minimal important difference is established. A decrease in score of 1 point or greater is considered a positive change.   Pulmonary Function Assessment:   Exercise Target Goals: Exercise Program Goal: Individual exercise prescription set using results from initial 6 min walk test and THRR while considering  patient's activity barriers and safety.   Exercise Prescription Goal: Initial exercise prescription builds to 30-45 minutes a day of aerobic activity, 2-3 days per week.  Home exercise guidelines will be given to patient during program as part of exercise prescription that the participant will acknowledge.  Activity Barriers & Risk Stratification:  Activity Barriers & Cardiac Risk Stratification - 11/20/22 1320       Activity Barriers & Cardiac Risk Stratification   Activity Barriers Arthritis;Back Problems;Shortness of Breath;Balance Concerns;Assistive Device    Cardiac Risk Stratification Moderate             6 Minute Walk:  6 Minute Walk     Row Name 11/26/22 1535         6 Minute Walk   Phase Initial     Distance 650 feet     Walk Time 6 minutes     # of Rest Breaks 1     MPH 1.23     METS 1.14     RPE 12     Perceived Dyspnea  13     VO2 Peak 4     Symptoms Yes (comment)     Comments one sitting break due to SOB and fatigue (1 minute)     Resting HR 72 bpm     Resting BP 130/70     Resting Oxygen Saturation  96 %     Exercise Oxygen Saturation  during 6 min walk 93 %     Max Ex. HR 110 bpm     Max Ex. BP 162/78     2 Minute Post BP 142/78       Interval HR   1 Minute HR 100     2 Minute HR 106     3 Minute HR 111     4 Minute HR 110     5 Minute HR 93     6 Minute HR 109     2 Minute Post HR 87     Interval Heart Rate? Yes       Interval Oxygen   Interval  Oxygen? Yes     Baseline Oxygen Saturation % 96 %     1 Minute Oxygen Saturation % 94 %     1 Minute Liters of Oxygen 4 L     2 Minute Oxygen Saturation % 93 %     2 Minute Liters of Oxygen 4 L     3 Minute Oxygen Saturation % 93 %     3 Minute Liters of Oxygen 4 L     4 Minute Oxygen Saturation % 93 %     4 Minute Liters of Oxygen 4 L     5 Minute Oxygen Saturation % 94 %     5 Minute Liters of Oxygen 4 L     6 Minute Oxygen Saturation % 93 %  6 Minute Liters of Oxygen 4 L     2 Minute Post Oxygen Saturation % 98 %     2 Minute Post Liters of Oxygen 4 L              Oxygen Initial Assessment:  Oxygen Initial Assessment - 11/20/22 1446       Home Oxygen   Home Oxygen Device Home Concentrator;E-Tanks    Sleep Oxygen Prescription Continuous    Liters per minute 5    Home Resting Oxygen Prescription Continuous    Liters per minute 5    Compliance with Home Oxygen Use Yes      Intervention   Short Term Goals To learn and exhibit compliance with exercise, home and travel O2 prescription;To learn and understand importance of monitoring SPO2 with pulse oximeter and demonstrate accurate use of the pulse oximeter.;To learn and understand importance of maintaining oxygen saturations>88%;To learn and demonstrate proper pursed lip breathing techniques or other breathing techniques.     Long  Term Goals Exhibits compliance with exercise, home  and travel O2 prescription;Verbalizes importance of monitoring SPO2 with pulse oximeter and return demonstration;Maintenance of O2 saturations>88%;Exhibits proper breathing techniques, such as pursed lip breathing or other method taught during program session             Oxygen Re-Evaluation:  Oxygen Re-Evaluation     Row Name 12/16/22 0725             Program Oxygen Prescription   Program Oxygen Prescription Continuous       Liters per minute 6         Home Oxygen   Home Oxygen Device Home Concentrator;E-Tanks       Sleep  Oxygen Prescription Continuous       Liters per minute 5       Home Exercise Oxygen Prescription Continuous       Liters per minute 6       Home Resting Oxygen Prescription Continuous       Liters per minute 5       Compliance with Home Oxygen Use Yes         Goals/Expected Outcomes   Short Term Goals To learn and exhibit compliance with exercise, home and travel O2 prescription;To learn and understand importance of monitoring SPO2 with pulse oximeter and demonstrate accurate use of the pulse oximeter.;To learn and understand importance of maintaining oxygen saturations>88%;To learn and demonstrate proper pursed lip breathing techniques or other breathing techniques.        Long  Term Goals Exhibits compliance with exercise, home  and travel O2 prescription;Verbalizes importance of monitoring SPO2 with pulse oximeter and return demonstration;Maintenance of O2 saturations>88%;Exhibits proper breathing techniques, such as pursed lip breathing or other method taught during program session       Goals/Expected Outcomes compliance                Oxygen Discharge (Final Oxygen Re-Evaluation):  Oxygen Re-Evaluation - 12/16/22 0725       Program Oxygen Prescription   Program Oxygen Prescription Continuous    Liters per minute 6      Home Oxygen   Home Oxygen Device Home Concentrator;E-Tanks    Sleep Oxygen Prescription Continuous    Liters per minute 5    Home Exercise Oxygen Prescription Continuous    Liters per minute 6    Home Resting Oxygen Prescription Continuous    Liters per minute 5    Compliance with Home Oxygen Use Yes  Goals/Expected Outcomes   Short Term Goals To learn and exhibit compliance with exercise, home and travel O2 prescription;To learn and understand importance of monitoring SPO2 with pulse oximeter and demonstrate accurate use of the pulse oximeter.;To learn and understand importance of maintaining oxygen saturations>88%;To learn and demonstrate proper pursed  lip breathing techniques or other breathing techniques.     Long  Term Goals Exhibits compliance with exercise, home  and travel O2 prescription;Verbalizes importance of monitoring SPO2 with pulse oximeter and return demonstration;Maintenance of O2 saturations>88%;Exhibits proper breathing techniques, such as pursed lip breathing or other method taught during program session    Goals/Expected Outcomes compliance             Initial Exercise Prescription:  Initial Exercise Prescription - 11/26/22 1500       Date of Initial Exercise RX and Referring Provider   Date 11/26/22    Referring Provider Dr. Aundra Dubin    Expected Discharge Date 03/25/23      Oxygen   Oxygen Continuous    Liters 4    Maintain Oxygen Saturation 88% or higher      NuStep   Level 1    SPM 80    Minutes 39      Prescription Details   Frequency (times per week) 2    Duration Progress to 30 minutes of continuous aerobic without signs/symptoms of physical distress      Intensity   THRR 40-80% of Max Heartrate 60-120    Ratings of Perceived Exertion 11-13      Resistance Training   Training Prescription Yes    Weight 0    Reps 10-15             Perform Capillary Blood Glucose checks as needed.  Exercise Prescription Changes:   Exercise Prescription Changes     Row Name 12/01/22 1500 12/16/22 0700           Response to Exercise   Blood Pressure (Admit) 126/70 142/74      Blood Pressure (Exercise) 148/70 142/74      Blood Pressure (Exit) 158/78 138/84      Heart Rate (Admit) 64 bpm 86 bpm      Heart Rate (Exercise) 88 bpm 92 bpm      Heart Rate (Exit) 74 bpm 85 bpm      Oxygen Saturation (Admit) 97 % 96 %      Oxygen Saturation (Exercise) 94 % 94 %      Oxygen Saturation (Exit) 98 % 98 %      Rating of Perceived Exertion (Exercise) 12 12      Perceived Dyspnea (Exercise) 12 12      Duration Continue with 30 min of aerobic exercise without signs/symptoms of physical distress. Continue  with 30 min of aerobic exercise without signs/symptoms of physical distress.      Intensity THRR unchanged THRR unchanged        Progression   Progression Continue to progress workloads to maintain intensity without signs/symptoms of physical distress. Continue to progress workloads to maintain intensity without signs/symptoms of physical distress.        Resistance Training   Training Prescription Yes Yes      Weight 0 0      Reps 10-15 10-15      Time 10 Minutes 10 Minutes        Oxygen   Oxygen Continuous Continuous      Liters 6 6        NuStep  Level 1 2      SPM 81 101      Minutes 39 39      METs 2 2.2        Oxygen   Maintain Oxygen Saturation 88% or higher 88% or higher               Exercise Comments:   Exercise Goals and Review:   Exercise Goals     Row Name 11/26/22 1540 12/16/22 0729           Exercise Goals   Increase Physical Activity Yes Yes      Intervention Provide advice, education, support and counseling about physical activity/exercise needs.;Develop an individualized exercise prescription for aerobic and resistive training based on initial evaluation findings, risk stratification, comorbidities and participant's personal goals. Provide advice, education, support and counseling about physical activity/exercise needs.;Develop an individualized exercise prescription for aerobic and resistive training based on initial evaluation findings, risk stratification, comorbidities and participant's personal goals.      Expected Outcomes Short Term: Attend rehab on a regular basis to increase amount of physical activity.;Long Term: Add in home exercise to make exercise part of routine and to increase amount of physical activity.;Long Term: Exercising regularly at least 3-5 days a week. Short Term: Attend rehab on a regular basis to increase amount of physical activity.;Long Term: Add in home exercise to make exercise part of routine and to increase amount of  physical activity.;Long Term: Exercising regularly at least 3-5 days a week.      Increase Strength and Stamina Yes Yes      Intervention Provide advice, education, support and counseling about physical activity/exercise needs.;Develop an individualized exercise prescription for aerobic and resistive training based on initial evaluation findings, risk stratification, comorbidities and participant's personal goals. Provide advice, education, support and counseling about physical activity/exercise needs.;Develop an individualized exercise prescription for aerobic and resistive training based on initial evaluation findings, risk stratification, comorbidities and participant's personal goals.      Expected Outcomes Short Term: Increase workloads from initial exercise prescription for resistance, speed, and METs.;Short Term: Perform resistance training exercises routinely during rehab and add in resistance training at home;Long Term: Improve cardiorespiratory fitness, muscular endurance and strength as measured by increased METs and functional capacity ( ) Short Term: Increase workloads from initial exercise prescription for resistance, speed, and METs.;Short Term: Perform resistance training exercises routinely during rehab and add in resistance training at home;Long Term: Improve cardiorespiratory fitness, muscular endurance and strength as measured by increased METs and functional capacity ( )      Able to understand and use rate of perceived exertion (RPE) scale Yes Yes      Intervention Provide education and explanation on how to use RPE scale Provide education and explanation on how to use RPE scale      Expected Outcomes Short Term: Able to use RPE daily in rehab to express subjective intensity level;Long Term:  Able to use RPE to guide intensity level when exercising independently Short Term: Able to use RPE daily in rehab to express subjective intensity level;Long Term:  Able to use RPE to guide  intensity level when exercising independently      Knowledge and understanding of Target Heart Rate Range (THRR) Yes Yes      Intervention Provide education and explanation of THRR including how the numbers were predicted and where they are located for reference Provide education and explanation of THRR including how the numbers were predicted and where they are located for  reference      Expected Outcomes Short Term: Able to state/look up THRR;Short Term: Able to use daily as guideline for intensity in rehab;Long Term: Able to use THRR to govern intensity when exercising independently Short Term: Able to state/look up THRR;Short Term: Able to use daily as guideline for intensity in rehab;Long Term: Able to use THRR to govern intensity when exercising independently      Able to check pulse independently Yes Yes      Intervention Provide education and demonstration on how to check pulse in carotid and radial arteries.;Review the importance of being able to check your own pulse for safety during independent exercise Provide education and demonstration on how to check pulse in carotid and radial arteries.;Review the importance of being able to check your own pulse for safety during independent exercise      Expected Outcomes Short Term: Able to explain why pulse checking is important during independent exercise;Long Term: Able to check pulse independently and accurately Short Term: Able to explain why pulse checking is important during independent exercise;Long Term: Able to check pulse independently and accurately      Understanding of Exercise Prescription Yes Yes      Intervention Provide education, explanation, and written materials on patient's individual exercise prescription Provide education, explanation, and written materials on patient's individual exercise prescription      Expected Outcomes Short Term: Able to explain program exercise prescription;Long Term: Able to explain home exercise  prescription to exercise independently Short Term: Able to explain program exercise prescription;Long Term: Able to explain home exercise prescription to exercise independently               Exercise Goals Re-Evaluation :  Exercise Goals Re-Evaluation     Row Name 12/16/22 0729             Exercise Goal Re-Evaluation   Exercise Goals Review Increase Physical Activity;Increase Strength and Stamina;Able to understand and use rate of perceived exertion (RPE) scale;Able to understand and use Dyspnea scale;Knowledge and understanding of Target Heart Rate Range (THRR);Understanding of Exercise Prescription       Comments Pt has completed 7 sessions of PR. His blood sugar has been more consistent over the past few sessions, as he had two episodes of hypoglycemia in the early part of the program. He has been able to progress his workload, and he is motivated while he is in class. He is currently exercising at 2.2 METs on the stepper. Will continue to monitor and progress as able.       Expected Outcomes Through exercise at rehab and at home, the patient will meet their stated goals.                Discharge Exercise Prescription (Final Exercise Prescription Changes):  Exercise Prescription Changes - 12/16/22 0700       Response to Exercise   Blood Pressure (Admit) 142/74    Blood Pressure (Exercise) 142/74    Blood Pressure (Exit) 138/84    Heart Rate (Admit) 86 bpm    Heart Rate (Exercise) 92 bpm    Heart Rate (Exit) 85 bpm    Oxygen Saturation (Admit) 96 %    Oxygen Saturation (Exercise) 94 %    Oxygen Saturation (Exit) 98 %    Rating of Perceived Exertion (Exercise) 12    Perceived Dyspnea (Exercise) 12    Duration Continue with 30 min of aerobic exercise without signs/symptoms of physical distress.    Intensity THRR unchanged  Progression   Progression Continue to progress workloads to maintain intensity without signs/symptoms of physical distress.      Resistance  Training   Training Prescription Yes    Weight 0    Reps 10-15    Time 10 Minutes      Oxygen   Oxygen Continuous    Liters 6      NuStep   Level 2    SPM 101    Minutes 39    METs 2.2      Oxygen   Maintain Oxygen Saturation 88% or higher             Nutrition:  Target Goals: Understanding of nutrition guidelines, daily intake of sodium 1500mg , cholesterol 200mg , calories 30% from fat and 7% or less from saturated fats, daily to have 5 or more servings of fruits and vegetables.  Biometrics:  Pre Biometrics - 11/26/22 1541       Pre Biometrics   Height  (1.727 m)    Weight 131.7 kg    Waist Circumference 56 inches    Hip Circumference 55 inches    Waist to Hip Ratio 1.02 %    BMI (Calculated) 44.16    Triceps Skinfold 40 mm    % Body Fat 45.6 %    Grip Strength 31.3 kg    Flexibility 0 in    Single Leg Stand 0 seconds              Nutrition Therapy Plan and Nutrition Goals:  Nutrition Therapy & Goals - 11/20/22 1431       Personal Nutrition Goals   Additional Goals? No    Comments Patient scored 43 on his diet assessment. He cooks for himself mostly. Handouts provided and explained on heathier choices and DM. We offer 2 educational sessions on heart healthy nutrition with handouts and assistance with RD referral if patient is interested.      Intervention Plan   Intervention Nutrition handout(s) given to patient.    Expected Outcomes Short Term Goal: Understand basic principles of dietary content, such as calories, fat, sodium, cholesterol and nutrients.             Nutrition Assessments:  Nutrition Assessments - 11/20/22 1430       MEDFICTS Scores   Pre Score 43            MEDIFICTS Score Key: ?70 Need to make dietary changes  40-70 Heart Healthy Diet ? 40 Therapeutic Level Cholesterol Diet   Picture Your Plate Scores: <16 Unhealthy dietary pattern with much room for improvement. 41-50 Dietary pattern unlikely to meet  recommendations for good health and room for improvement. 51-60 More healthful dietary pattern, with some room for improvement.  >60 Healthy dietary pattern, although there may be some specific behaviors that could be improved.    Nutrition Goals Re-Evaluation:   Nutrition Goals Discharge (Final Nutrition Goals Re-Evaluation):   Psychosocial: Target Goals: Acknowledge presence or absence of significant depression and/or stress, maximize coping skills, provide positive support system. Participant is able to verbalize types and ability to use techniques and skills needed for reducing stress and depression.  Initial Review & Psychosocial Screening:  Initial Psych Review & Screening - 11/20/22 1436       Initial Review   Current issues with None Identified      Family Dynamics   Good Support System? Yes      Barriers   Psychosocial barriers to participate in program There are no  identifiable barriers or psychosocial needs.      Screening Interventions   Interventions Encouraged to exercise;Provide feedback about the scores to participant    Expected Outcomes Short Term goal: Identification and review with participant of any Quality of Life or Depression concerns found by scoring the questionnaire.             Quality of Life Scores:  Quality of Life - 12/07/22 0901       Quality of Life   Select Quality of Life      Quality of Life Scores   Health/Function Pre 13.13 %    Socioeconomic Pre 24.88 %    Psych/Spiritual Pre 23.07 %    Family Pre 22 %    GLOBAL Pre 18.07 %            Scores of 19 and below usually indicate a poorer quality of life in these areas.  A difference of  2-3 points is a clinically meaningful difference.  A difference of 2-3 points in the total score of the Quality of Life Index has been associated with significant improvement in overall quality of life, self-image, physical symptoms, and general health in studies assessing change in quality of  life.   PHQ-9: Review Flowsheet       11/20/2022  Depression screen PHQ 2/9  Decreased Interest 0  Down, Depressed, Hopeless 0  PHQ - 2 Score 0  Altered sleeping 0  Tired, decreased energy 2  Change in appetite 0  Feeling bad or failure about yourself  0  Trouble concentrating 0  Moving slowly or fidgety/restless 0  Suicidal thoughts 0  PHQ-9 Score 2  Difficult doing work/chores Very difficult   Interpretation of Total Score  Total Score Depression Severity:  1-4 = Minimal depression, 5-9 = Mild depression, 10-14 = Moderate depression, 15-19 = Moderately severe depression, 20-27 = Severe depression   Psychosocial Evaluation and Intervention:  Psychosocial Evaluation - 11/20/22 1437       Psychosocial Evaluation & Interventions   Interventions Encouraged to exercise with the program and follow exercise prescription;Stress management education;Relaxation education    Comments Patient has no psychosocial barriers or needs identified at his orientation visit. His PHQ-9 score was 2 due to lack of energy. He lives alone. He has a daughter and 2 sons. He says he does not have a good relationship with his sons but his daugther is supportative of him and he also has a few friends that support him. He denies any depression or anxiety but does say he gets lonely since his wife died in 2010. He says he is not able to do the things he used to do like fishing and is not able to do his house cleaning which is frusturating for him. He was not able to complete his walk test today due to low CBG. He says he only eats 2 meals/day and was encouraged to eat 3 meals/day wtih snacks. He says he does have a lot of hypoglycemic episodes at night. I encouraged him to eat a high protein snack before he goes to bed. He verbalized understanding. He is currently not monitoring his glucose. He had a free style monitor but it stopped working and he is looking for another. He wants to participate in PR hope to gain  strength and endurance and improve his walking.    Expected Outcomes Patient will continue to have no psychosocial barriers identified.    Continue Psychosocial Services  No Follow up required  Psychosocial Re-Evaluation:  Psychosocial Re-Evaluation     Row Name 12/07/22 [phone]             Psychosocial Re-Evaluation   Current issues with None Identified       Comments Patient is new to program attending 3 sessions.  His intital QOL  score is 8.07 and his PHQ score is 2 .  He seems to enjoy coming to class and interactive with class and staff. Patient demonstrates an interest in the program and improving his health .   Patient was referred to PR by Dr. Shirlee LatchMcLean with a diagnosis of Pulmonary HTN and chronic resp failure with hypoxia.  We will continue to monitor as he progresses in the program.       Expected Outcomes Patient will have no psychosocial barriers identified at discharge.       Interventions Stress management education;Encouraged to attend Pulmonary Rehabilitation for the exercise;Relaxation education       Continue Psychosocial Services  No Follow up required                Psychosocial Discharge (Final Psychosocial Re-Evaluation):  Psychosocial Re-Evaluation - 12/07/22 [id number]       Psychosocial Re-Evaluation   Current issues with None Identified    Comments Patient is new to program attending 3 sessions.  His intital QOL  score is 8.07 and his PHQ score is 2 .  He seems to enjoy coming to class and interactive with class and staff. Patient demonstrates an interest in the program and improving his health .   Patient was referred to PR by Dr. Shirlee LatchMcLean with a diagnosis of Pulmonary HTN and chronic resp failure with hypoxia.  We will continue to monitor as he progresses in the program.    Expected Outcomes Patient will have no psychosocial barriers identified at discharge.    Interventions Stress management education;Encouraged to attend Pulmonary Rehabilitation  for the exercise;Relaxation education    Continue Psychosocial Services  No Follow up required              Education: Education Goals: Education classes will be provided on a weekly basis, covering required topics. Participant will state understanding/return demonstration of topics presented.  Learning Barriers/Preferences:  Learning Barriers/Preferences - 11/20/22 1432       Learning Barriers/Preferences   Learning Preferences Written Material;Audio;Skilled Demonstration             Education Topics: How Lungs Work and Diseases: - Discuss the anatomy of the lungs and diseases that can affect the lungs, such as COPD.   Exercise: -Discuss the importance of exercise, FITT principles of exercise, normal and abnormal responses to exercise, and how to exercise safely.   Environmental Irritants: -Discuss types of environmental irritants and how to limit exposure to environmental irritants. Flowsheet Row PULMONARY REHAB OTHER RESPIRATORY from [facility]2022 [facility]  Date 11/26/22  Educator HB  Instruction Review Code 1- Verbalizes Understanding       Meds/Inhalers and oxygen: - Discuss respiratory medications, definition of an inhaler and oxygen, and the proper way to use an inhaler and oxygen. Flowsheet Row PULMONARY REHAB OTHER RESPIRATORY [facility]  Date 12/03/22  Educator DM       Energy Saving Techniques: - Discuss methods to conserve energy and decrease shortness of breath when performing activities of daily living.  Flowsheet Row PULMONARY REHAB OTHER RESPIRATORY [facility]  Date 12/10/22  Educator HB  Instruction  Review Code 1- Verbalizes Understanding       Bronchial Hygiene / Breathing Techniques: - Discuss breathing mechanics, pursed-lip breathing technique,  proper posture, effective ways to clear airways, and other functional breathing  techniques   Cleaning Equipment: - Provides group verbal and written instruction about the health risks of elevated stress, cause of high stress, and healthy ways to reduce stress.   Nutrition I: Fats: - Discuss the types of cholesterol, what cholesterol does to the body, and how cholesterol levels can be controlled.   Nutrition II: Labels: -Discuss the different components of food labels and how to read food labels.   Respiratory Infections: - Discuss the signs and symptoms of respiratory infections, ways to prevent respiratory infections, and the importance of seeking medical treatment when having a respiratory infection.   Stress I: Signs and Symptoms: - Discuss the causes of stress, how stress may lead to anxiety and depression, and ways to limit stress.   Stress II: Relaxation: -Discuss relaxation techniques to limit stress.   Oxygen for Home/Travel: - Discuss how to prepare for travel when on oxygen and proper ways to transport and store oxygen to ensure safety.   Knowledge Questionnaire Score:  Knowledge Questionnaire Score - 11/20/22 1432       Knowledge Questionnaire Score   Pre Score 10/18             Core Components/Risk Factors/Patient Goals at Admission:  Personal Goals and Risk Factors at Admission - 11/20/22 1433       Core Components/Risk Factors/Patient Goals on Admission    Weight Management Obesity    Improve shortness of breath with ADL's Yes    Intervention Provide education, individualized exercise plan and daily activity instruction to help decrease symptoms of SOB with activities of daily living.    Expected Outcomes Short Term: Improve cardiorespiratory fitness to achieve a reduction of symptoms when performing ADLs;Long Term: Be able to perform more ADLs without symptoms or delay the onset of symptoms    Diabetes Yes    Intervention Provide education about signs/symptoms and action to take for hypo/hyperglycemia.;Provide education about  proper nutrition, including hydration, and aerobic/resistive exercise prescription along with prescribed medications to achieve blood glucose in normal ranges: Fasting glucose 65-99 mg/dL    Expected Outcomes Short Term: Participant verbalizes understanding of the signs/symptoms and immediate care of hyper/hypoglycemia, proper foot care and importance of medication, aerobic/resistive exercise and nutrition plan for blood glucose control.;Long Term: Attainment of HbA1C < 7%.    Personal Goal Other Yes    Personal Goal Patient wants to improve his strength and endurance and be able to longer distances.    Intervention Patient will attend PR 2 days/week with exercise and education.    Expected Outcomes Patient will complete the program meeting both personal and program goals.             Core Components/Risk Factors/Patient Goals Review:   Goals and Risk Factor Review     Row Name 12/07/22 1309             Core Components/Risk Factors/Patient Goals Review   Personal Goals Review Develop more efficient breathing techniques such as purse lipped breathing and diaphragmatic breathing and practicing self-pacing with activity.;Improve shortness of breath with ADL's;Other       Review Patient is new to the program completing 3 sessions.  He was referred to PR by Shirlee LatchMcLean with a diagnosis of Pulmonary HTN and chronic resp failure with hypoxia.  We will help him  with deveoping a efficient breathing technique such as purse lipped breathing and pratice self pacing with increased activity.  When exercising increases O2 up 6L averaging between 94%-97% O2 sats.  Patient tolerate exercise well.  His glucose readings have been within our exercise parameters.  His  personal goals wants to increase strength and endurance, working toward walking longer distances.  We will continue to monitor as he works toward meeting these goals.       Expected Outcomes Patient will complete the program meeting both program and  personal goals.                Core Components/Risk Factors/Patient Goals at Discharge (Final Review):   Goals and Risk Factor Review - 12/07/22 1309       Core Components/Risk Factors/Patient Goals Review   Personal Goals Review Develop more efficient breathing techniques such as purse lipped breathing and diaphragmatic breathing and practicing self-pacing with activity.;Improve shortness of breath with ADL's;Other    Review Patient is new to the program completing 3 sessions.  He was referred to PR by Shirlee Latch with a diagnosis of Pulmonary HTN and chronic resp failure with hypoxia.  We will help him with deveoping a efficient breathing technique such as purse lipped breathing and pratice self pacing with increased activity.  When exercising increases O2 up 6L averaging between 94%-97% O2 sats.  Patient tolerate exercise well.  His glucose readings have been within our exercise parameters.  His  personal goals wants to increase strength and endurance, working toward walking longer distances.  We will continue to monitor as he works toward meeting these goals.    Expected Outcomes Patient will complete the program meeting both program and personal goals.             ITP Comments:   Comments: ITP REVIEW Pt is making expected progress toward pulmonary rehab goals after completing 7 sessions. Recommend continued exercise, life style modification, education, and utilization of breathing techniques to increase stamina and strength and decrease shortness of breath with exertion.

## 2022-12-17 ENCOUNTER — Encounter (HOSPITAL_COMMUNITY)
Admission: RE | Admit: 2022-12-17 | Discharge: 2022-12-17 | Disposition: A | Discharge status: Home / Self Care | Payer: Medicare (Managed Care) | Source: Ambulatory Visit | Attending: Cardiology | Admitting: Cardiology

## 2022-12-17 DIAGNOSIS — I272 Pulmonary hypertension, unspecified: Secondary | ICD-10-CM | POA: Diagnosis present

## 2022-12-17 DIAGNOSIS — J9611 Chronic respiratory failure with hypoxia: Secondary | ICD-10-CM | POA: Diagnosis present

## 2022-12-17 NOTE — Progress Notes (Signed)
Daily Session Note  Patient Details  Name: Colton Jackson. MRN: [id number] Date of Birth: [birth year] Referring Provider:   Flowsheet Row PULMONARY REHAB OTHER RESPIRATORY from 11/26/2022 in [place]  Referring Provider Dr. Aundra Dubin       Encounter Date: 12/17/2022  Check In:  Session Check In - 12/17/22 1458       Check-In   Supervising physician immediately available to respond to emergencies [facility] MD immediately available    Physician(s) Dr Dellia Cloud    Location [facility]    Staff Present Leana Roe, BS, Exercise Physiologist;Cambria Osten Rosalita Levan BSN, RN    Virtual Visit No    Medication changes reported     Yes    Comments Pt stated his lasix was changed from BID to daily last week-pt was given triamcinolone cream for his hands on Friday    Fall or balance concerns reported    Yes    Comments Patient reports impaired balance and ambulates with a walker.    Tobacco Cessation No Change    Warm-up and Cool-down Performed as group-led instruction    Resistance Training Performed Yes    VAD Patient? No    PAD/SET Patient? No      Pain Assessment   Currently in Pain? Yes    Pain Score 3     Pain Location Back    Pain Orientation Lower    Pain Descriptors / Indicators Aching;Sore    Pain Type Chronic pain    Pain Onset More than a month ago    Pain Frequency Constant    Pain Relieving Factors Pt takes Norco    Multiple Pain Sites No             Capillary Blood Glucose: No results found for this or any previous visit (from the past 24 hour(s)).    Social History   Tobacco Use  Smoking Status Former   Types: Cigarettes   Quit date: 1971   Years since quitting: 53.1  Smokeless Tobacco Never  Tobacco Comments   smoked when he was a teenager    Goals Met:  Proper associated with RPD/PD & O2 Sat Independence with exercise equipment Using PLB without cueing & demonstrates good technique Exercise tolerated  well Queuing for purse lip breathing No report of concerns or symptoms today Strength training completed today  Goals Unmet:  Not Applicable  Comments: check out at 16:00   Dr. Kathie Dike is Medical Director for [facility].

## 2022-12-22 ENCOUNTER — Encounter (HOSPITAL_COMMUNITY): Payer: Medicare (Managed Care)

## 2022-12-24 ENCOUNTER — Encounter (HOSPITAL_COMMUNITY)
Admission: RE | Admit: 2022-12-24 | Discharge: 2022-12-24 | Disposition: A | Discharge status: Home / Self Care | Payer: Medicare (Managed Care) | Source: Ambulatory Visit | Attending: Cardiology | Admitting: Cardiology

## 2022-12-24 DIAGNOSIS — J9611 Chronic respiratory failure with hypoxia: Secondary | ICD-10-CM

## 2022-12-24 DIAGNOSIS — I272 Pulmonary hypertension, unspecified: Secondary | ICD-10-CM

## 2022-12-24 NOTE — Progress Notes (Signed)
Daily Session Note  Patient Details  Name: Colton Jackson. MRN: [id number] Date of Birth: 09-19-1951 Referring Provider:   Flowsheet Row PULMONARY REHAB OTHER RESPIRATORY from 11/26/2022 in [place]  Referring Provider Dr. Aundra Dubin       Encounter Date: 12/24/2022  Check In:  Session Check In - 12/24/22 1456       Check-In   Supervising physician immediately available to respond to emergencies [facility] MD immediately available    Physician(s) Dr. Harl Bowie    [facility]    Staff Present Leana Roe, BS, Exercise Physiologist;Tametha Banning Rosalita Levan BSN, RN    Virtual Visit No    Medication changes reported     Yes    Comments Pt stated his lasix was changed from BID to daily last week-pt was given triamcinolone cream for his hands on Friday-pt stated he is using an OTC nasal spray    Fall or balance concerns reported    Yes    Comments Patient reports impaired balance and ambulates with a walker.    Tobacco Cessation No Change    Warm-up and Cool-down Performed as group-led instruction    Resistance Training Performed Yes    VAD Patient? No    PAD/SET Patient? No      Pain Assessment   Currently in Pain? No/denies    Pain Score 0-No pain    Multiple Pain Sites No             Capillary Blood Glucose: No results found for this or any previous visit (from the past 24 hour(s)).    Social History   Tobacco Use  Smoking Status Former   Types: Cigarettes   Quit date: 1971   Years since quitting: 53.1  Smokeless Tobacco Never  Tobacco Comments   smoked when he was a teenager    Goals Met:  Proper associated with RPD/PD & O2 Sat Independence with exercise equipment Using PLB without cueing & demonstrates good technique Exercise tolerated well Queuing for purse lip breathing No report of concerns or symptoms today Strength training completed today  Goals Unmet:  Not Applicable  Comments: check out at 16:00   Dr.  Kathie Dike is Medical Director for [facility].

## 2022-12-29 ENCOUNTER — Encounter (HOSPITAL_COMMUNITY)
Admission: RE | Admit: 2022-12-29 | Discharge: 2022-12-29 | Disposition: A | Discharge status: Home / Self Care | Payer: Medicare (Managed Care) | Source: Ambulatory Visit | Attending: Cardiology | Admitting: Cardiology

## 2022-12-29 DIAGNOSIS — I272 Pulmonary hypertension, unspecified: Secondary | ICD-10-CM

## 2022-12-29 DIAGNOSIS — J9611 Chronic respiratory failure with hypoxia: Secondary | ICD-10-CM

## 2022-12-29 LAB — GLUCOSE, CAPILLARY: Glucose-Capillary: 90 mg/dL (ref 70–99)

## 2022-12-29 NOTE — Progress Notes (Signed)
Incomplete Session Note  Patient Details  Name: Colton Jackson. MRN: [id number] Date of Birth: [birth year] Referring Provider:   Flowsheet Row PULMONARY REHAB OTHER RESPIRATORY from 11/26/2022 in [place]  Referring Provider Dr. Leandro Reasoner Veronia Beets. did not complete his rehab session.  CBG 81 on patients monitor.  Denies any symptoms at this time. Given OJ and peanut butter crackers.  Will re-check CBG in 15 minutes.  Will not exercise pt today.

## 2022-12-31 ENCOUNTER — Encounter (HOSPITAL_COMMUNITY)
Admission: RE | Admit: 2022-12-31 | Discharge: 2022-12-31 | Disposition: A | Discharge status: Home / Self Care | Payer: Medicare (Managed Care) | Source: Ambulatory Visit | Attending: Cardiology | Admitting: Cardiology

## 2022-12-31 DIAGNOSIS — I272 Pulmonary hypertension, unspecified: Secondary | ICD-10-CM | POA: Diagnosis not present

## 2022-12-31 DIAGNOSIS — J9611 Chronic respiratory failure with hypoxia: Secondary | ICD-10-CM

## 2022-12-31 NOTE — Progress Notes (Signed)
Daily Session Note  Patient Details  Name: Colton Jackson. MRN: [id number] Date of Birth: 05-09-1951 Referring Provider:   Flowsheet Row PULMONARY REHAB OTHER RESPIRATORY from 11/26/2022 in [place]  Referring Provider Dr. Aundra Dubin       Encounter Date: 12/31/2022  Check In:  Session Check In - 12/31/22 1456       Check-In   Supervising physician immediately available to respond to emergencies [facility] MD immediately available    Physician(s) Dr Domenic Polite    Location [facility]    Staff Present Naseer Hearn Hassell Done, RN, BSN;Hillary Troutman BSN, RN    Virtual Visit No    Medication changes reported     Yes    Comments Pt stated his lasix was changed from BID to daily last week-pt was given triamcinolone cream for his hands on Friday-pt stated he is using an OTC nasal spray    Fall or balance concerns reported    No    Comments Patient reports impaired balance and ambulates with a walker.    Tobacco Cessation No Change    Warm-up and Cool-down Performed as group-led instruction    Resistance Training Performed No    VAD Patient? No    PAD/SET Patient? No      Pain Assessment   Currently in Pain? No/denies    Pain Score 0-No pain    Multiple Pain Sites No             Capillary Blood Glucose: No results found for this or any previous visit (from the past 24 hour(s)).    Social History   Tobacco Use  Smoking Status Former   Types: Cigarettes   Quit date: 1971   Years since quitting: 53.1  Smokeless Tobacco Never  Tobacco Comments   smoked when he was a teenager    Goals Met:  Proper associated with RPD/PD & O2 Sat Independence with exercise equipment Using PLB without cueing & demonstrates good technique Exercise tolerated well Queuing for purse lip breathing No report of concerns or symptoms today Strength training completed today  Goals Unmet:  Not Applicable  Comments: Checkout at 1600.  Pt reports not taking insulin,  only taking Glucophage.  CBG 99.  Pt did eat crackers in waiting area before coming in and pt was given OJ.     Dr. Kathie Dike is Medical Director for [facility] Pul[facility].

## 2023-01-05 ENCOUNTER — Encounter (HOSPITAL_COMMUNITY)
Admission: RE | Admit: 2023-01-05 | Discharge: 2023-01-05 | Disposition: A | Discharge status: Home / Self Care | Payer: Medicare (Managed Care) | Source: Ambulatory Visit | Attending: Cardiology | Admitting: Cardiology

## 2023-01-05 DIAGNOSIS — I272 Pulmonary hypertension, unspecified: Secondary | ICD-10-CM

## 2023-01-05 DIAGNOSIS — J9611 Chronic respiratory failure with hypoxia: Secondary | ICD-10-CM

## 2023-01-05 LAB — GLUCOSE, CAPILLARY: Glucose-Capillary: 156 mg/dL — ABNORMAL HIGH (ref 70–99)

## 2023-01-05 NOTE — Progress Notes (Signed)
Daily Session Note  Patient Details  Name: Colton Jackson. MRN: [id number] Date of Birth: 02-Feb-1951 Referring Provider:   Flowsheet Row PULMONARY REHAB OTHER RESPIRATORY from 11/26/2022 in [place]  Referring Provider Dr. Aundra Dubin       Encounter Date: 01/05/2023  Check In:  Session Check In - 01/05/23 1456       Check-In   Supervising physician immediately available to respond to emergencies [facility] MD immediately available    Physician(s) Dr. Dellia Cloud    Location [facility]    Staff Present Hoy Register MHA, MS, ACSM-CEP;Leana Roe, BS, Exercise Physiologist    Virtual Visit No    Medication changes reported     No    Fall or balance concerns reported    Yes    Comments Patient reports impaired balance and ambulates with a walker.    Tobacco Cessation No Change    Warm-up and Cool-down Performed as group-led instruction    Resistance Training Performed Yes    VAD Patient? No    PAD/SET Patient? No      Pain Assessment   Currently in Pain? No/denies    Pain Score 0-No pain    Multiple Pain Sites No             Capillary Blood Glucose: Results for orders placed or performed during the hospital encounter of 01/05/23 (from the past 24 hour(s))  Glucose, capillary     Status: Abnormal   Collection Time: 01/05/23  2:49 PM  Result Value Ref Range   Glucose-Capillary 156 (H) 70 - 99 mg/dL      Social History   Tobacco Use  Smoking Status Former   Types: Cigarettes   Quit date: 1971   Years since quitting: 53.1  Smokeless Tobacco Never  Tobacco Comments   smoked when he was a teenager    Goals Met:  Proper associated with RPD/PD & O2 Sat Independence with exercise equipment Using PLB without cueing & demonstrates good technique Exercise tolerated well Queuing for purse lip breathing No report of concerns or symptoms today Strength training completed today  Goals Unmet:  Not Applicable  Comments:  checkout time is 1600   Dr. Kathie Dike is Medical Director for [facility].

## 2023-01-07 ENCOUNTER — Encounter (HOSPITAL_COMMUNITY)
Admission: RE | Admit: 2023-01-07 | Discharge: 2023-01-07 | Disposition: A | Discharge status: Home / Self Care | Payer: Medicare (Managed Care) | Source: Ambulatory Visit | Attending: Cardiology | Admitting: Cardiology

## 2023-01-07 DIAGNOSIS — J9611 Chronic respiratory failure with hypoxia: Secondary | ICD-10-CM

## 2023-01-07 DIAGNOSIS — I272 Pulmonary hypertension, unspecified: Secondary | ICD-10-CM | POA: Diagnosis not present

## 2023-01-07 NOTE — Progress Notes (Signed)
Daily Session Note  Patient Details  Name: Colton Jackson. MRN: [id number] Date of Birth: Mar 26, 1951 Referring Provider:   Flowsheet Row PULMONARY REHAB OTHER RESPIRATORY from 11/26/2022 in [place]  Referring Provider Dr. Aundra Dubin       Encounter Date: 01/07/2023  Check In:  Session Check In - 01/07/23 1453       Check-In   Supervising physician immediately available to respond to emergencies [facility] MD immediately available    Physician(s) Dr. Dellia Cloud    Location [facility]    Staff Present Leana Roe, BS, Exercise Physiologist;Danitra Payano Rosalita Levan BSN, RN    Virtual Visit No    Medication changes reported     No    Comments Pt stated his lasix was changed from BID to daily last week-pt was given triamcinolone cream for his hands on Friday-pt stated he is using an OTC nasal spray    Fall or balance concerns reported    Yes    Comments Patient reports impaired balance and ambulates with a walker.    Tobacco Cessation No Change    Warm-up and Cool-down Performed as group-led instruction    Resistance Training Performed Yes    VAD Patient? No    PAD/SET Patient? No      Pain Assessment   Currently in Pain? No/denies    Pain Score 0-No pain    Multiple Pain Sites No             Capillary Blood Glucose: No results found for this or any previous visit (from the past 24 hour(s)).    Social History   Tobacco Use  Smoking Status Former   Types: Cigarettes   Quit date: 1971   Years since quitting: 53.1  Smokeless Tobacco Never  Tobacco Comments   smoked when he was a teenager    Goals Met:  Proper associated with RPD/PD & O2 Sat Independence with exercise equipment Using PLB without cueing & demonstrates good technique Exercise tolerated well Queuing for purse lip breathing No report of concerns or symptoms today Strength training completed today  Goals Unmet:  Not Applicable  Comments: check out at 16:00   Dr.  Kathie Dike is Medical Director for [facility].

## 2023-01-12 ENCOUNTER — Encounter (HOSPITAL_COMMUNITY)
Admission: RE | Admit: 2023-01-12 | Discharge: 2023-01-12 | Disposition: A | Discharge status: Home / Self Care | Payer: Medicare (Managed Care) | Source: Ambulatory Visit | Attending: Cardiology | Admitting: Cardiology

## 2023-01-12 VITALS — Wt 284.0 lb

## 2023-01-12 DIAGNOSIS — I272 Pulmonary hypertension, unspecified: Secondary | ICD-10-CM

## 2023-01-12 DIAGNOSIS — J9611 Chronic respiratory failure with hypoxia: Secondary | ICD-10-CM

## 2023-01-12 NOTE — Progress Notes (Signed)
Daily Session Note  Patient Details  Name: Colton Jackson. MRN: [id number] Date of Birth: June 30, 1951 Referring Provider:   Flowsheet Row PULMONARY REHAB OTHER RESPIRATORY from 11/26/2022 in [place]  Referring Provider Dr. Aundra Dubin       Encounter Date: 01/12/2023  Check In:  Session Check In - 01/12/23 1451       Check-In   Supervising physician immediately available to respond to emergencies [facility] MD immediately available    Physician(s) Dr. Harl Bowie    Location [facility]    Staff Present Leana Roe, BS, Exercise Physiologist;Michaell Grider, RN;Dalton Sherrie George, MS, ACSM-CEP    Virtual Visit No    Medication changes reported     No    Fall or balance concerns reported    Yes    Comments Patient reports impaired balance and ambulates with a walker.    Tobacco Cessation No Change    Warm-up and Cool-down Performed as group-led instruction    Resistance Training Performed Yes    VAD Patient? No    PAD/SET Patient? No      Pain Assessment   Currently in Pain? No/denies    Pain Score 0-No pain    Multiple Pain Sites No             Capillary Blood Glucose: No results found for this or any previous visit (from the past 24 hour(s)).    Social History   Tobacco Use  Smoking Status Former   Types: Cigarettes   Quit date: 1971   Years since quitting: 53.1  Smokeless Tobacco Never  Tobacco Comments   smoked when he was a teenager    Goals Met:  Proper associated with RPD/PD & O2 Sat Improved SOB with ADL's Exercise tolerated well No report of concerns or symptoms today  Goals Unmet:  Not Applicable  Comments: check out @ 4:00pm   Dr. Kathie Dike is Medical Director for [facility].

## 2023-01-13 NOTE — Progress Notes (Signed)
Pulmonary Individual Treatment Plan  Patient Details  Name: Lennyx Gaal. MRN: [id number] Date of Birth: January 30, 1951 Referring Provider:   Flowsheet Row PULMONARY REHAB OTHER RESPIRATORY from 11/26/2022 in [place]  Referring Provider Dr. Aundra Dubin       Initial Encounter Date:  Flowsheet Row PULMONARY REHAB OTHER RESPIRATORY from 11/26/2022 in [place]  Date 11/26/22       Visit Diagnosis: Pulmonary HTN ([place])  Chronic respiratory failure with hypoxia ([place])  Patient's Home Medications on Admission:   Current Outpatient Medications:    acetaminophen (TYLENOL) 500 MG tablet, Take 500 mg by mouth 3 (three) times daily as needed (pain.)., Disp: , Rfl:    azelastine (ASTELIN) 0.1 % nasal spray, 1 puff each nostril twice daily as needed, Disp: 30 mL, Rfl: 12   Cholecalciferol (VITAMIN D3) 10 MCG (400 UNIT) CAPS, Take 1 capsule by mouth daily., Disp: , Rfl:    cromolyn (NASALCROM) 5.2 MG/ACT nasal spray, Place 1 spray into both nostrils 3 (three) times daily as needed for allergies. , Disp: , Rfl:    famotidine (PEPCID) 20 MG tablet, Take 20 mg by mouth 2 (two) times daily. , Disp: , Rfl:    furosemide (LASIX) 40 MG tablet, Take 1 tablet (40 mg total) by mouth daily., Disp: 30 tablet, Rfl: 11   glipiZIDE (GLUCOTROL) 10 MG tablet, Take 10 mg by mouth 2 (two) times daily. , Disp: , Rfl:    HYDROcodone-acetaminophen (NORCO) 10-325 MG tablet, Take 1 tablet by mouth 3 (three) times daily as needed for moderate pain. , Disp: , Rfl:    Krill Oil 500 MG CAPS, Take 500 mg by mouth daily. , Disp: , Rfl:    latanoprost (XALATAN) 0.005 % ophthalmic solution, INSTILL 1 DROP IN BOTH EYES EVERY DAY AT BEDTIME, Disp: 7.5 mL, Rfl: 12   LEVEMIR FLEXPEN 100 UNIT/ML FlexPen, Inject 25-50 Units into the skin daily as needed (blood sugar greater than 130.)., Disp: , Rfl:    losartan (COZAAR) 50 MG tablet, Take 0.5 tablets (25 mg total) by mouth daily., Disp: 90  tablet, Rfl: 3   meclizine (ANTIVERT) 25 MG tablet, Take 25 mg by mouth 3 (three) times daily as needed for dizziness or nausea (when taking hydrocodone/apap). , Disp: , Rfl:    meloxicam (MOBIC) 7.5 MG tablet, Take 7.5 mg by mouth 2 (two) times daily., Disp: , Rfl:    metFORMIN (GLUCOPHAGE-XR) 500 MG 24 hr tablet, Take 1,000 mg by mouth 2 (two) times daily., Disp: , Rfl:    metoprolol succinate (TOPROL-XL) 50 MG 24 hr tablet, Take 1 tablet (50 mg total) by mouth daily., Disp: 30 tablet, Rfl: 3   OXYGEN, Inhale 5 L/hr into the lungs daily., Disp: , Rfl:    psyllium (METAMUCIL) 58.6 % powder, Take 1 packet by mouth 2 (two) times daily. , Disp: , Rfl:    spironolactone (ALDACTONE) 25 MG tablet, Take 1 tablet (25 mg total) by mouth daily., Disp: 90 tablet, Rfl: 3   trospium (SANCTURA) 20 MG tablet, Take 1 tablet (20 mg total) by mouth 2 (two) times daily., Disp: 180 tablet, Rfl: 3  Past Medical History: Past Medical History:  Diagnosis Date   Allergic rhinitis    Aneurysm artery, neck (HCC)    [name] pulmonologist   Degenerative disk disease    Essential hypertension    GERD (gastroesophageal reflux disease)    H/O cardiovascular stress test 02/02/11   EF 59% - Normal stress test  Hearing deficit, right    Knee pain, bilateral    Lower back pain    Morbid obesity (HCC)    OSA (obstructive sleep apnea)    On CPAP   Thyroid nodule    Type 2 diabetes mellitus ([place])    Type 2 diabetes mellitus without complication ([place]) 123456   Qualifier: Diagnosis of  By: Annamaria Boots MD, [place] D     Tobacco Use: Social History   Tobacco Use  Smoking Status Former   Types: Cigarettes   Quit date: 1971   Years since quitting: 53.1  Smokeless Tobacco Never  Tobacco Comments   smoked when he was a teenager    Labs: Review Flowsheet  More data exists      Latest Ref Rng & Units 06/23/2018 06/04/2022 06/05/2022 07/13/2022 08/04/2022  Labs for ITP Cardiac and Pulmonary Rehab  Cholestrol 0 - 200  mg/dL - - - - 111   LDL (calc) 0 - 99 mg/dL - - - - 62   HDL-C >40 mg/dL - - - - 27   Trlycerides <150 mg/dL - - - - 111   Hemoglobin A1c 4.8 - 5.6 % - 6.9  - - -  Bicarbonate 20.0 - 28.0 mmol/L 20.0 - 28.0 mmol/L 20.0 - 28.0 mmol/L 27.3  28.5  - 36.8  32.8  32.8  32.7  -  TCO2 22 - 32 mmol/L 22 - 32 mmol/L 22 - 32 mmol/L 29  30  - - 35  35  34  -  O2 Saturation % % % 74.0  74.0  - 27.6  71  69  68  -    Capillary Blood Glucose: Lab Results  Component Value Date   GLUCAP 156 (H) 01/05/2023   GLUCAP 90 12/29/2022   GLUCAP 205 (H) 12/15/2022   GLUCAP 230 (H) 12/08/2022   GLUCAP 134 (H) 12/03/2022    POCT Glucose     Row Name 12/16/22 0726             POCT Blood Glucose   Pre-Exercise 205 mg/dL                Pulmonary Assessment Scores:  Pulmonary Assessment Scores     Row Name 11/20/22 1447         ADL UCSD   ADL Phase Entry     SOB Score total 31     Rest 0     Walk 3     Stairs 5     Bath 0     Dress 2     Shop 3       CAT Score   CAT Score 10       mMRC Score   mMRC Score 3             UCSD: Self-administered rating of dyspnea associated with activities of daily living (ADLs) 6-point scale (0 = "not at all" to 5 = "maximal or unable to do because of breathlessness")  Scoring Scores range from 0 to 120.  Minimally important difference is 5 units  CAT: CAT can identify the health impairment of COPD patients and is better correlated with disease progression.  CAT has a scoring range of zero to 40. The CAT score is classified into four groups of low (less than 10), medium (10 - 20), high (21-30) and very high (31-40) based on the impact level of disease on health status. A CAT score over 10 suggests significant symptoms.  A worsening CAT score  could be explained by an exacerbation, poor medication adherence, poor inhaler technique, or progression of COPD or comorbid conditions.  CAT MCID is 2 points  mMRC: mMRC (Modified Medical Research  Council) Dyspnea Scale is used to assess the degree of baseline functional disability in patients of respiratory disease due to dyspnea. No minimal important difference is established. A decrease in score of 1 point or greater is considered a positive change.   Pulmonary Function Assessment:   Exercise Target Goals: Exercise Program Goal: Individual exercise prescription set using results from initial 6 min walk test and THRR while considering  patient's activity barriers and safety.   Exercise Prescription Goal: Initial exercise prescription builds to 30-45 minutes a day of aerobic activity, 2-3 days per week.  Home exercise guidelines will be given to patient during program as part of exercise prescription that the participant will acknowledge.  Activity Barriers & Risk Stratification:  Activity Barriers & Cardiac Risk Stratification - 11/20/22 1320       Activity Barriers & Cardiac Risk Stratification   Activity Barriers Arthritis;Back Problems;Shortness of Breath;Balance Concerns;Assistive Device    Cardiac Risk Stratification Moderate             6 Minute Walk:  6 Minute Walk     Row Name 11/26/22 1535         6 Minute Walk   Phase Initial     Distance 650 feet     Walk Time 6 minutes     # of Rest Breaks 1     MPH 1.23     METS 1.14     RPE 12     Perceived Dyspnea  13     VO2 Peak 4     Symptoms Yes (comment)     Comments one sitting break due to SOB and fatigue (1 minute)     Resting HR 72 bpm     Resting BP 130/70     Resting Oxygen Saturation  96 %     Exercise Oxygen Saturation  during 6 min walk 93 %     Max Ex. HR 110 bpm     Max Ex. BP 162/78     2 Minute Post BP 142/78       Interval HR   1 Minute HR 100     2 Minute HR 106     3 Minute HR 111     4 Minute HR 110     5 Minute HR 93     6 Minute HR 109     2 Minute Post HR 87     Interval Heart Rate? Yes       Interval Oxygen   Interval Oxygen? Yes     Baseline Oxygen Saturation % 96 %      1 Minute Oxygen Saturation % 94 %     1 Minute Liters of Oxygen 4 L     2 Minute Oxygen Saturation % 93 %     2 Minute Liters of Oxygen 4 L     3 Minute Oxygen Saturation % 93 %     3 Minute Liters of Oxygen 4 L     4 Minute Oxygen Saturation % 93 %     4 Minute Liters of Oxygen 4 L     5 Minute Oxygen Saturation % 94 %     5 Minute Liters of Oxygen 4 L     6 Minute Oxygen Saturation % 93 %  6 Minute Liters of Oxygen 4 L     2 Minute Post Oxygen Saturation % 98 %     2 Minute Post Liters of Oxygen 4 L              Oxygen Initial Assessment:  Oxygen Initial Assessment - 11/20/22 1446       Home Oxygen   Home Oxygen Device Home Concentrator;E-Tanks    Sleep Oxygen Prescription Continuous    Liters per minute 5    Home Resting Oxygen Prescription Continuous    Liters per minute 5    Compliance with Home Oxygen Use Yes      Intervention   Short Term Goals To learn and exhibit compliance with exercise, home and travel O2 prescription;To learn and understand importance of monitoring SPO2 with pulse oximeter and demonstrate accurate use of the pulse oximeter.;To learn and understand importance of maintaining oxygen saturations>88%;To learn and demonstrate proper pursed lip breathing techniques or other breathing techniques.     Long  Term Goals Exhibits compliance with exercise, home  and travel O2 prescription;Verbalizes importance of monitoring SPO2 with pulse oximeter and return demonstration;Maintenance of O2 saturations>88%;Exhibits proper breathing techniques, such as pursed lip breathing or other method taught during program session             Oxygen Re-Evaluation:  Oxygen Re-Evaluation     Row Name 12/16/22 0725 01/13/23 0743           Program Oxygen Prescription   Program Oxygen Prescription Continuous Continuous      Liters per minute 6 6        Home Oxygen   Home Oxygen Device Home Concentrator;E-Tanks Home Concentrator;E-Tanks      Sleep Oxygen  Prescription Continuous Continuous      Liters per minute 5 5      Home Exercise Oxygen Prescription Continuous Continuous      Liters per minute 6 6      Home Resting Oxygen Prescription Continuous Continuous      Liters per minute 5 5      Compliance with Home Oxygen Use Yes Yes        Goals/Expected Outcomes   Short Term Goals To learn and exhibit compliance with exercise, home and travel O2 prescription;To learn and understand importance of monitoring SPO2 with pulse oximeter and demonstrate accurate use of the pulse oximeter.;To learn and understand importance of maintaining oxygen saturations>88%;To learn and demonstrate proper pursed lip breathing techniques or other breathing techniques.  To learn and exhibit compliance with exercise, home and travel O2 prescription;To learn and understand importance of monitoring SPO2 with pulse oximeter and demonstrate accurate use of the pulse oximeter.;To learn and understand importance of maintaining oxygen saturations>88%;To learn and demonstrate proper pursed lip breathing techniques or other breathing techniques.       Long  Term Goals Exhibits compliance with exercise, home  and travel O2 prescription;Verbalizes importance of monitoring SPO2 with pulse oximeter and return demonstration;Maintenance of O2 saturations>88%;Exhibits proper breathing techniques, such as pursed lip breathing or other method taught during program session Exhibits compliance with exercise, home  and travel O2 prescription;Verbalizes importance of monitoring SPO2 with pulse oximeter and return demonstration;Maintenance of O2 saturations>88%;Exhibits proper breathing techniques, such as pursed lip breathing or other method taught during program session      Goals/Expected Outcomes compliance compliance               Oxygen Discharge (Final Oxygen Re-Evaluation):  Oxygen Re-Evaluation - 01/13/23 [id number]  Program Oxygen Prescription   Program Oxygen Prescription  Continuous    Liters per minute 6      Home Oxygen   Home Oxygen Device Home Concentrator;E-Tanks    Sleep Oxygen Prescription Continuous    Liters per minute 5    Home Exercise Oxygen Prescription Continuous    Liters per minute 6    Home Resting Oxygen Prescription Continuous    Liters per minute 5    Compliance with Home Oxygen Use Yes      Goals/Expected Outcomes   Short Term Goals To learn and exhibit compliance with exercise, home and travel O2 prescription;To learn and understand importance of monitoring SPO2 with pulse oximeter and demonstrate accurate use of the pulse oximeter.;To learn and understand importance of maintaining oxygen saturations>88%;To learn and demonstrate proper pursed lip breathing techniques or other breathing techniques.     Long  Term Goals Exhibits compliance with exercise, home  and travel O2 prescription;Verbalizes importance of monitoring SPO2 with pulse oximeter and return demonstration;Maintenance of O2 saturations>88%;Exhibits proper breathing techniques, such as pursed lip breathing or other method taught during program session    Goals/Expected Outcomes compliance             Initial Exercise Prescription:  Initial Exercise Prescription - 11/26/22 1500       Date of Initial Exercise RX and Referring Provider   Date 11/26/22    Referring Provider Dr. Aundra Dubin    Expected Discharge Date 03/25/23      Oxygen   Oxygen Continuous    Liters 4    Maintain Oxygen Saturation 88% or higher      NuStep   Level 1    SPM 80    Minutes 39      Prescription Details   Frequency (times per week) 2    Duration Progress to 30 minutes of continuous aerobic without signs/symptoms of physical distress      Intensity   THRR 40-80% of Max Heartrate 60-120    Ratings of Perceived Exertion 11-13      Resistance Training   Training Prescription Yes    Weight 0    Reps 10-15             Perform Capillary Blood Glucose checks as  needed.  Exercise Prescription Changes:   Exercise Prescription Changes     Row Name 12/01/22 1500 12/16/22 0700 01/12/23 1500         Response to Exercise   Blood Pressure (Admit) 126/70 142/74 158/78     Blood Pressure (Exercise) 148/70 142/74 160/66     Blood Pressure (Exit) 158/78 138/84 140/60     Heart Rate (Admit) 64 bpm 86 bpm 81 bpm     Heart Rate (Exercise) 88 bpm 92 bpm 83 bpm     Heart Rate (Exit) 74 bpm 85 bpm 74 bpm     Oxygen Saturation (Admit) 97 % 96 % 95 %     Oxygen Saturation (Exercise) 94 % 94 % 92 %     Oxygen Saturation (Exit) 98 % 98 % 97 %     Rating of Perceived Exertion (Exercise) '12 12 12     '$ Perceived Dyspnea (Exercise) '12 12 12     '$ Duration Continue with 30 min of aerobic exercise without signs/symptoms of physical distress. Continue with 30 min of aerobic exercise without signs/symptoms of physical distress. Continue with 30 min of aerobic exercise without signs/symptoms of physical distress.     Intensity THRR unchanged THRR unchanged  THRR unchanged       Progression   Progression Continue to progress workloads to maintain intensity without signs/symptoms of physical distress. Continue to progress workloads to maintain intensity without signs/symptoms of physical distress. Continue to progress workloads to maintain intensity without signs/symptoms of physical distress.       Resistance Training   Training Prescription Yes Yes Yes     Weight 0 0 0     Reps 10-15 10-15 10-15     Time 10 Minutes 10 Minutes 10 Minutes       Oxygen   Oxygen Continuous Continuous Continuous     Liters '6 6 6       '$ NuStep   Level '1 2 3     '$ SPM 81 101 90     Minutes 39 39 39     METs 2 2.2 2.1       Oxygen   Maintain Oxygen Saturation 88% or higher 88% or higher --              Exercise Comments:   Exercise Goals and Review:   Exercise Goals     Row Name 11/26/22 1540 12/16/22 0729 01/13/23 0738         Exercise Goals   Increase Physical Activity  Yes Yes Yes     Intervention Provide advice, education, support and counseling about physical activity/exercise needs.;Develop an individualized exercise prescription for aerobic and resistive training based on initial evaluation findings, risk stratification, comorbidities and participant's personal goals. Provide advice, education, support and counseling about physical activity/exercise needs.;Develop an individualized exercise prescription for aerobic and resistive training based on initial evaluation findings, risk stratification, comorbidities and participant's personal goals. Provide advice, education, support and counseling about physical activity/exercise needs.;Develop an individualized exercise prescription for aerobic and resistive training based on initial evaluation findings, risk stratification, comorbidities and participant's personal goals.     Expected Outcomes Short Term: Attend rehab on a regular basis to increase amount of physical activity.;Long Term: Add in home exercise to make exercise part of routine and to increase amount of physical activity.;Long Term: Exercising regularly at least 3-5 days a week. Short Term: Attend rehab on a regular basis to increase amount of physical activity.;Long Term: Add in home exercise to make exercise part of routine and to increase amount of physical activity.;Long Term: Exercising regularly at least 3-5 days a week. Short Term: Attend rehab on a regular basis to increase amount of physical activity.;Long Term: Add in home exercise to make exercise part of routine and to increase amount of physical activity.;Long Term: Exercising regularly at least 3-5 days a week.     Increase Strength and Stamina Yes Yes Yes     Intervention Provide advice, education, support and counseling about physical activity/exercise needs.;Develop an individualized exercise prescription for aerobic and resistive training based on initial evaluation findings, risk stratification,  comorbidities and participant's personal goals. Provide advice, education, support and counseling about physical activity/exercise needs.;Develop an individualized exercise prescription for aerobic and resistive training based on initial evaluation findings, risk stratification, comorbidities and participant's personal goals. Provide advice, education, support and counseling about physical activity/exercise needs.;Develop an individualized exercise prescription for aerobic and resistive training based on initial evaluation findings, risk stratification, comorbidities and participant's personal goals.     Expected Outcomes Short Term: Increase workloads from initial exercise prescription for resistance, speed, and METs.;Short Term: Perform resistance training exercises routinely during rehab and add in resistance training at home;Long Term: Improve cardiorespiratory fitness, muscular endurance  and strength as measured by increased METs and functional capacity (6MWT) Short Term: Increase workloads from initial exercise prescription for resistance, speed, and METs.;Short Term: Perform resistance training exercises routinely during rehab and add in resistance training at home;Long Term: Improve cardiorespiratory fitness, muscular endurance and strength as measured by increased METs and functional capacity (6MWT) Short Term: Increase workloads from initial exercise prescription for resistance, speed, and METs.;Short Term: Perform resistance training exercises routinely during rehab and add in resistance training at home;Long Term: Improve cardiorespiratory fitness, muscular endurance and strength as measured by increased METs and functional capacity (6MWT)     Able to understand and use rate of perceived exertion (RPE) scale Yes Yes Yes     Intervention Provide education and explanation on how to use RPE scale Provide education and explanation on how to use RPE scale Provide education and explanation on how to use RPE  scale     Expected Outcomes Short Term: Able to use RPE daily in rehab to express subjective intensity level;Long Term:  Able to use RPE to guide intensity level when exercising independently Short Term: Able to use RPE daily in rehab to express subjective intensity level;Long Term:  Able to use RPE to guide intensity level when exercising independently Short Term: Able to use RPE daily in rehab to express subjective intensity level;Long Term:  Able to use RPE to guide intensity level when exercising independently     Knowledge and understanding of Target Heart Rate Range (THRR) Yes Yes Yes     Intervention Provide education and explanation of THRR including how the numbers were predicted and where they are located for reference Provide education and explanation of THRR including how the numbers were predicted and where they are located for reference Provide education and explanation of THRR including how the numbers were predicted and where they are located for reference     Expected Outcomes Short Term: Able to state/look up THRR;Short Term: Able to use daily as guideline for intensity in rehab;Long Term: Able to use THRR to govern intensity when exercising independently Short Term: Able to state/look up THRR;Short Term: Able to use daily as guideline for intensity in rehab;Long Term: Able to use THRR to govern intensity when exercising independently Short Term: Able to state/look up THRR;Short Term: Able to use daily as guideline for intensity in rehab;Long Term: Able to use THRR to govern intensity when exercising independently     Able to check pulse independently Yes Yes Yes     Intervention Provide education and demonstration on how to check pulse in carotid and radial arteries.;Review the importance of being able to check your own pulse for safety during independent exercise Provide education and demonstration on how to check pulse in carotid and radial arteries.;Review the importance of being able to  check your own pulse for safety during independent exercise Provide education and demonstration on how to check pulse in carotid and radial arteries.;Review the importance of being able to check your own pulse for safety during independent exercise     Expected Outcomes Short Term: Able to explain why pulse checking is important during independent exercise;Long Term: Able to check pulse independently and accurately Short Term: Able to explain why pulse checking is important during independent exercise;Long Term: Able to check pulse independently and accurately Short Term: Able to explain why pulse checking is important during independent exercise;Long Term: Able to check pulse independently and accurately     Understanding of Exercise Prescription Yes Yes Yes  Intervention Provide education, explanation, and written materials on patient's individual exercise prescription Provide education, explanation, and written materials on patient's individual exercise prescription Provide education, explanation, and written materials on patient's individual exercise prescription     Expected Outcomes Short Term: Able to explain program exercise prescription;Long Term: Able to explain home exercise prescription to exercise independently Short Term: Able to explain program exercise prescription;Long Term: Able to explain home exercise prescription to exercise independently Short Term: Able to explain program exercise prescription;Long Term: Able to explain home exercise prescription to exercise independently              Exercise Goals Re-Evaluation :  Exercise Goals Re-Evaluation     Row Name 12/16/22 0729 01/13/23 0739           Exercise Goal Re-Evaluation   Exercise Goals Review Increase Physical Activity;Increase Strength and Stamina;Able to understand and use rate of perceived exertion (RPE) scale;Able to understand and use Dyspnea scale;Knowledge and understanding of Target Heart Rate Range  (THRR);Understanding of Exercise Prescription Increase Physical Activity;Increase Strength and Stamina;Able to understand and use rate of perceived exertion (RPE) scale;Able to understand and use Dyspnea scale;Knowledge and understanding of Target Heart Rate Range (THRR);Understanding of Exercise Prescription      Comments Pt has completed 7 sessions of PR. His blood sugar has been more consistent over the past few sessions, as he had two episodes of hypoglycemia in the early part of the program. He has been able to progress his workload, and he is motivated while he is in class. He is currently exercising at 2.2 METs on the stepper. Will continue to monitor and progress as able. Pt has completed 13 sessiosn of PR. His blood sugar continues to be consistent the past sessions and hes able to attend class compare to the first couple sessions. He is tolerating exercise well and has increased his workload. He recently complained of back pain over the weekend and he thinks it may be due to exercise but after asking more questions he was out shopping and walking around that weekend as well which would contribute to the over use. He is currently exercising at 2.1 METs on the stepper. Will continue to monitor and progress as able.      Expected Outcomes Through exercise at rehab and at home, the patient will meet their stated goals. Through exercise at rehab and at home, the patient will meet their stated goals.               Discharge Exercise Prescription (Final Exercise Prescription Changes):  Exercise Prescription Changes - 01/12/23 1500       Response to Exercise   Blood Pressure (Admit) 158/78    Blood Pressure (Exercise) 160/66    Blood Pressure (Exit) 140/60    Heart Rate (Admit) 81 bpm    Heart Rate (Exercise) 83 bpm    Heart Rate (Exit) 74 bpm    Oxygen Saturation (Admit) 95 %    Oxygen Saturation (Exercise) 92 %    Oxygen Saturation (Exit) 97 %    Rating of Perceived Exertion (Exercise) 12     Perceived Dyspnea (Exercise) 12    Duration Continue with 30 min of aerobic exercise without signs/symptoms of physical distress.    Intensity THRR unchanged      Progression   Progression Continue to progress workloads to maintain intensity without signs/symptoms of physical distress.      Resistance Training   Training Prescription Yes    Weight 0  Reps 10-15    Time 10 Minutes      Oxygen   Oxygen Continuous    Liters 6      NuStep   Level 3    SPM 90    Minutes 39    METs 2.1             Nutrition:  Target Goals: Understanding of nutrition guidelines, daily intake of sodium '1500mg'$ , cholesterol '200mg'$ , calories 30% from fat and 7% or less from saturated fats, daily to have 5 or more servings of fruits and vegetables.  Biometrics:  Pre Biometrics - 11/26/22 1541       Pre Biometrics   Height '5\' 8"'$  (1.727 m)    Weight 131.7 kg    Waist Circumference 56 inches    Hip Circumference 55 inches    Waist to Hip Ratio 1.02 %    BMI (Calculated) 44.16    Triceps Skinfold 40 mm    % Body Fat 45.6 %    Grip Strength 31.3 kg    Flexibility 0 in    Single Leg Stand 0 seconds              Nutrition Therapy Plan and Nutrition Goals:  Nutrition Therapy & Goals - 11/20/22 1431       Personal Nutrition Goals   Additional Goals? No    Comments Patient scored 43 on his diet assessment. He cooks for himself mostly. Handouts provided and explained on heathier choices and DM. We offer 2 educational sessions on heart healthy nutrition with handouts and assistance with RD referral if patient is interested.      Intervention Plan   Intervention Nutrition handout(s) given to patient.    Expected Outcomes Short Term Goal: Understand basic principles of dietary content, such as calories, fat, sodium, cholesterol and nutrients.             Nutrition Assessments:  Nutrition Assessments - 11/20/22 1430       MEDFICTS Scores   Pre Score 43             MEDIFICTS Score Key: ?70 Need to make dietary changes  40-70 Heart Healthy Diet ? 40 Therapeutic Level Cholesterol Diet   Picture Your Plate Scores: [id number] Unhealthy dietary pattern with much room for improvement. 41-50 Dietary pattern unlikely to meet recommendations for good health and room for improvement. 51-60 More healthful dietary pattern, with some room for improvement.  >60 Healthy dietary pattern, although there may be some specific behaviors that could be improved.    Nutrition Goals Re-Evaluation:   Nutrition Goals Discharge (Final Nutrition Goals Re-Evaluation):   Psychosocial: Target Goals: Acknowledge presence or absence of significant depression and/or stress, maximize coping skills, provide positive support system. Participant is able to verbalize types and ability to use techniques and skills needed for reducing stress and depression.  Initial Review & Psychosocial Screening:  Initial Psych Review & Screening - 11/20/22 1436       Initial Review   Current issues with None Identified      Family Dynamics   Good Support System? Yes      Barriers   Psychosocial barriers to participate in program There are no identifiable barriers or psychosocial needs.      Screening Interventions   Interventions Encouraged to exercise;Provide feedback about the scores to participant    Expected Outcomes Short Term goal: Identification and review with participant of any Quality of Life or Depression concerns found by scoring the questionnaire.  Quality of Life Scores:  Quality of Life - 12/07/22 0901       Quality of Life   Select Quality of Life      Quality of Life Scores   Health/Function Pre 13.13 %    Socioeconomic Pre 24.88 %    Psych/Spiritual Pre 23.07 %    Family Pre 22 %    GLOBAL Pre 18.07 %            Scores of 19 and below usually indicate a poorer quality of life in these areas.  A difference of  2-3 points is a clinically  meaningful difference.  A difference of 2-3 points in the total score of the Quality of Life Index has been associated with significant improvement in overall quality of life, self-image, physical symptoms, and general health in studies assessing change in quality of life.   PHQ-9: Review Flowsheet       11/20/2022  Depression screen PHQ 2/9  Decreased Interest 0  Down, Depressed, Hopeless 0  PHQ - 2 Score 0  Altered sleeping 0  Tired, decreased energy 2  Change in appetite 0  Feeling bad or failure about yourself  0  Trouble concentrating 0  Moving slowly or fidgety/restless 0  Suicidal thoughts 0  PHQ-9 Score 2  Difficult doing work/chores Very difficult   Interpretation of Total Score  Total Score Depression Severity:  1-4 = Minimal depression, 5-9 = Mild depression, 10-14 = Moderate depression, 15-19 = Moderately severe depression, 20-27 = Severe depression   Psychosocial Evaluation and Intervention:  Psychosocial Evaluation - 11/20/22 1437       Psychosocial Evaluation & Interventions   Interventions Encouraged to exercise with the program and follow exercise prescription;Stress management education;Relaxation education    Comments Patient has no psychosocial barriers or needs identified at his orientation visit. His PHQ-9 score was 2 due to lack of energy. He lives alone. He has a daughter and 2 sons. He says he does not have a good relationship with his sons but his daugther is supportative of him and he also has a few friends that support him. He denies any depression or anxiety but does say he gets lonely since his wife died in 2009-02-11. He says he is not able to do the things he used to do like fishing and is not able to do his house cleaning which is frusturating for him. He was not able to complete his walk test today due to low CBG. He says he only eats 2 meals/day and was encouraged to eat 3 meals/day wtih snacks. He says he does have a lot of hypoglycemic episodes at night. I  encouraged him to eat a high protein snack before he goes to bed. He verbalized understanding. He is currently not monitoring his glucose. He had a free style monitor but it stopped working and he is looking for another. He wants to participate in PR hope to gain strength and endurance and improve his walking.    Expected Outcomes Patient will continue to have no psychosocial barriers identified.    Continue Psychosocial Services  No Follow up required             Psychosocial Re-Evaluation:  Psychosocial Re-Evaluation     [place] Name 12/07/22 [phone] 01/04/23 0856           Psychosocial Re-Evaluation   Current issues with None Identified None Identified      Comments Patient is new to program attending 3 sessions.  His  intital QOL  score is 8.07 and his PHQ score is 2 .  He seems to enjoy coming to class and interactive with class and staff. Patient demonstrates an interest in the program and improving his health .   Patient was referred to PR by Dr. Aundra Dubin with a diagnosis of Pulmonary HTN and chronic resp failure with hypoxia.  We will continue to monitor as he progresses in the program. Patient has coompleted 10 sessions.   He seems to enjoy coming to class and interactive well with class and staff.  Patient demonstrates an interest in the program and improving his health .   Patient was referred to PR by Dr. Aundra Dubin with a diagnosis of Pulmonary HTN and chronic resp failure with hypoxia.  We will continue to monitor as he progresses in the program.      Expected Outcomes Patient will have no psychosocial barriers identified at discharge. Patient will have no psychosocial barriers identified at discharge.      Interventions Stress management education;Encouraged to attend Pulmonary Rehabilitation for the exercise;Relaxation education Stress management education;Encouraged to attend Pulmonary Rehabilitation for the exercise;Relaxation education      Continue Psychosocial Services  No Follow up  required No Follow up required               Psychosocial Discharge (Final Psychosocial Re-Evaluation):  Psychosocial Re-Evaluation - 01/04/23 0856       Psychosocial Re-Evaluation   Current issues with None Identified    Comments Patient has coompleted 10 sessions.   He seems to enjoy coming to class and interactive well with class and staff.  Patient demonstrates an interest in the program and improving his health .   Patient was referred to PR by Dr. Aundra Dubin with a diagnosis of Pulmonary HTN and chronic resp failure with hypoxia.  We will continue to monitor as he progresses in the program.    Expected Outcomes Patient will have no psychosocial barriers identified at discharge.    Interventions Stress management education;Encouraged to attend Pulmonary Rehabilitation for the exercise;Relaxation education    Continue Psychosocial Services  No Follow up required              Education: Education Goals: Education classes will be provided on a weekly basis, covering required topics. Participant will state understanding/return demonstration of topics presented.  Learning Barriers/Preferences:  Learning Barriers/Preferences - 11/20/22 1432       Learning Barriers/Preferences   Learning Preferences Written Material;Audio;Skilled Demonstration             Education Topics: How Lungs Work and Diseases: - Discuss the anatomy of the lungs and diseases that can affect the lungs, such as COPD.   Exercise: -Discuss the importance of exercise, FITT principles of exercise, normal and abnormal responses to exercise, and how to exercise safely.   Environmental Irritants: -Discuss types of environmental irritants and how to limit exposure to environmental irritants. Flowsheet Row PULMONARY REHAB OTHER RESPIRATORY from 01/07/2023 in [place]  Date 11/26/22  Educator HB  Instruction Review Code 1- Verbalizes Understanding       Meds/Inhalers and  oxygen: - Discuss respiratory medications, definition of an inhaler and oxygen, and the proper way to use an inhaler and oxygen. Flowsheet Row PULMONARY REHAB OTHER RESPIRATORY from 01/07/2023 in [place]  Date 12/03/22  Educator DM       Energy Saving Techniques: - Discuss methods to conserve energy and decrease shortness of breath when performing activities of daily  living.  Flowsheet Row PULMONARY REHAB OTHER RESPIRATORY from 01/07/2023 in [place]  Date 12/10/22  Educator HB  Instruction Review Code 1- Verbalizes Understanding       Bronchial Hygiene / Breathing Techniques: - Discuss breathing mechanics, pursed-lip breathing technique,  proper posture, effective ways to clear airways, and other functional breathing techniques Flowsheet Row PULMONARY REHAB OTHER RESPIRATORY from 01/07/2023 in [place]  Date 12/17/22  Educator HB  Instruction Review Code 1- Research scientist (medical): - Provides group verbal and written instruction about the health risks of elevated stress, cause of high stress, and healthy ways to reduce stress. Flowsheet Row PULMONARY REHAB OTHER RESPIRATORY from 01/07/2023 in [place]  Date 12/24/22  Educator HB  Instruction Review Code 1- Verbalizes Understanding       Nutrition I: Fats: - Discuss the types of cholesterol, what cholesterol does to the body, and how cholesterol levels can be controlled. Flowsheet Row PULMONARY REHAB OTHER RESPIRATORY from 01/07/2023 in [place]  Date 12/31/22  Educator Handout       Nutrition II: Labels: -Discuss the different components of food labels and how to read food labels. Flowsheet Row PULMONARY REHAB OTHER RESPIRATORY from 01/07/2023 in [place]  Date 01/07/23  Educator HB  Instruction Review Code 1- Verbalizes Understanding        Respiratory Infections: - Discuss the signs and symptoms of respiratory infections, ways to prevent respiratory infections, and the importance of seeking medical treatment when having a respiratory infection.   Stress I: Signs and Symptoms: - Discuss the causes of stress, how stress may lead to anxiety and depression, and ways to limit stress.   Stress II: Relaxation: -Discuss relaxation techniques to limit stress.   Oxygen for Home/Travel: - Discuss how to prepare for travel when on oxygen and proper ways to transport and store oxygen to ensure safety.   Knowledge Questionnaire Score:  Knowledge Questionnaire Score - 11/20/22 1432       Knowledge Questionnaire Score   Pre Score 10/18             Core Components/Risk Factors/Patient Goals at Admission:  Personal Goals and Risk Factors at Admission - 11/20/22 1433       Core Components/Risk Factors/Patient Goals on Admission    Weight Management Obesity    Improve shortness of breath with ADL's Yes    Intervention Provide education, individualized exercise plan and daily activity instruction to help decrease symptoms of SOB with activities of daily living.    Expected Outcomes Short Term: Improve cardiorespiratory fitness to achieve a reduction of symptoms when performing ADLs;Long Term: Be able to perform more ADLs without symptoms or delay the onset of symptoms    Diabetes Yes    Intervention Provide education about signs/symptoms and action to take for hypo/hyperglycemia.;Provide education about proper nutrition, including hydration, and aerobic/resistive exercise prescription along with prescribed medications to achieve blood glucose in normal ranges: Fasting glucose 65-99 mg/dL    Expected Outcomes Short Term: Participant verbalizes understanding of the signs/symptoms and immediate care of hyper/hypoglycemia, proper foot care and importance of medication, aerobic/resistive exercise and nutrition plan for blood  glucose control.;Long Term: Attainment of HbA1C < 7%.    Personal Goal Other Yes    Personal Goal Patient wants to improve his strength and endurance and be able to longer distances.    Intervention Patient will attend PR 2 days/week with exercise  and education.    Expected Outcomes Patient will complete the program meeting both personal and program goals.             Core Components/Risk Factors/Patient Goals Review:   Goals and Risk Factor Review     Row Name 12/07/22 1309 01/04/23 0858           Core Components/Risk Factors/Patient Goals Review   Personal Goals Review Develop more efficient breathing techniques such as purse lipped breathing and diaphragmatic breathing and practicing self-pacing with activity.;Improve shortness of breath with ADL's;Other Develop more efficient breathing techniques such as purse lipped breathing and diaphragmatic breathing and practicing self-pacing with activity.;Improve shortness of breath with ADL's;Other      Review Patient is new to the program completing 3 sessions.  He was referred to PR by Aundra Dubin with a diagnosis of Pulmonary HTN and chronic resp failure with hypoxia.  We will help him with deveoping a efficient breathing technique such as purse lipped breathing and pratice self pacing with increased activity.  When exercising increases O2 up 6L averaging between 94%-97% O2 sats.  Patient tolerate exercise well.  His glucose readings have been within our exercise parameters.  His  personal goals wants to increase strength and endurance, working toward walking longer distances.  We will continue to monitor as he works toward meeting these goals. Patient has completed 10 sessions.  We will help him with deveoping a efficient breathing technique such as purse lipped breathing and pratice self pacing with increased activity.  Patient continues to tolerate exercise well and while exercising increases O2 up 6L averaging between 90%-97% O2 sats.  Patient  tolerate exercise well.  His glucose readings have been up and down,  not controlled with diet, will encourage better meal planning.  His  personal goals wants to increase strength and endurance, working toward walking longer distances.  We will continue to monitor as he works toward meeting these goals.      Expected Outcomes Patient will complete the program meeting both program and personal goals. Patient will complete the program meeting both program and personal goals.               Core Components/Risk Factors/Patient Goals at Discharge (Final Review):   Goals and Risk Factor Review - 01/04/23 0858       Core Components/Risk Factors/Patient Goals Review   Personal Goals Review Develop more efficient breathing techniques such as purse lipped breathing and diaphragmatic breathing and practicing self-pacing with activity.;Improve shortness of breath with ADL's;Other    Review Patient has completed 10 sessions.  We will help him with deveoping a efficient breathing technique such as purse lipped breathing and pratice self pacing with increased activity.  Patient continues to tolerate exercise well and while exercising increases O2 up 6L averaging between 90%-97% O2 sats.  Patient tolerate exercise well.  His glucose readings have been up and down,  not controlled with diet, will encourage better meal planning.  His  personal goals wants to increase strength and endurance, working toward walking longer distances.  We will continue to monitor as he works toward meeting these goals.    Expected Outcomes Patient will complete the program meeting both program and personal goals.             ITP Comments:  ITP Comments     Row Name 12/29/22 1509 12/31/22 1523         ITP Comments CBG increased to 90, pt sent home.  Pt to  re-check when he gets home, follow up with PCP as needed. CBG has increased to 106, then 113.  Tolerating exercise session well.               Comments: ITP  REVIEW Pt is making expected progress toward pulmonary rehab goals after completing 14 sessions. Recommend continued exercise, life style modification, education, and utilization of breathing techniques to increase stamina and strength and decrease shortness of breath with exertion.

## 2023-01-14 ENCOUNTER — Encounter (HOSPITAL_COMMUNITY)
Admission: RE | Admit: 2023-01-14 | Discharge: 2023-01-14 | Disposition: A | Discharge status: Home / Self Care | Payer: Medicare (Managed Care) | Source: Ambulatory Visit | Attending: Cardiology | Admitting: Cardiology

## 2023-01-14 DIAGNOSIS — I272 Pulmonary hypertension, unspecified: Secondary | ICD-10-CM

## 2023-01-14 DIAGNOSIS — J9611 Chronic respiratory failure with hypoxia: Secondary | ICD-10-CM

## 2023-01-14 LAB — GLUCOSE, CAPILLARY: Glucose-Capillary: 75 mg/dL (ref 70–99)

## 2023-01-14 NOTE — Progress Notes (Signed)
Daily Session Note  Patient Details  Name: Colton Jackson. MRN: [id number] Date of Birth: 1951/06/12 Referring Provider:   Flowsheet Row PULMONARY REHAB OTHER RESPIRATORY from 11/26/2022 in [place]  Referring Provider Dr. Aundra Dubin       Encounter Date: 01/14/2023  Check In:  Session Check In - 01/14/23 1456       Check-In   Supervising physician immediately available to respond to emergencies [facility] MD immediately available    Physician(s) Dr. Harl Bowie    Location [facility]    Staff Present Leana Roe, BS, Exercise Physiologist;Jaymason Ledesma BSN, RN    Virtual Visit No    Medication changes reported     No    Comments no changes noted since lasix was changed and pt started using OTC nasal spray    Fall or balance concerns reported    Yes    Comments Patient reports impaired balance and ambulates with a walker.    Tobacco Cessation No Change    Warm-up and Cool-down Performed as group-led instruction    Resistance Training Performed Yes    VAD Patient? No    PAD/SET Patient? No      Pain Assessment   Currently in Pain? No/denies    Pain Score 0-No pain    Multiple Pain Sites No             Capillary Blood Glucose: No results found for this or any previous visit (from the past 24 hour(s)).    Social History   Tobacco Use  Smoking Status Former   Types: Cigarettes   Quit date: 1971   Years since quitting: 53.1  Smokeless Tobacco Never  Tobacco Comments   smoked when he was a teenager    Goals Met:  Proper associated with RPD/PD & O2 Sat Independence with exercise equipment Using PLB without cueing & demonstrates good technique Exercise tolerated well Queuing for purse lip breathing No report of concerns or symptoms today Strength training completed today  Goals Unmet:  Not Applicable  Comments: check out at 1600   Dr. Kathie Dike is Medical Director for [facility].

## 2023-01-19 ENCOUNTER — Encounter (HOSPITAL_COMMUNITY): Payer: Medicare (Managed Care)

## 2023-01-21 ENCOUNTER — Encounter (INDEPENDENT_AMBULATORY_CARE_PROVIDER_SITE_OTHER): Payer: Medicare Other | Admitting: Ophthalmology

## 2023-01-21 ENCOUNTER — Encounter (HOSPITAL_COMMUNITY): Payer: Medicare (Managed Care)

## 2023-01-26 ENCOUNTER — Encounter (HOSPITAL_COMMUNITY): Payer: Medicare (Managed Care)

## 2023-01-28 ENCOUNTER — Encounter (HOSPITAL_COMMUNITY): Payer: Medicare (Managed Care)

## 2023-01-28 NOTE — Progress Notes (Signed)
PCP: Dr. Hilma Favors Cardiology: Dr. Domenic Polite HF Cardiology: Dr. Aundra Dubin  72 y.o. with history of OHS/OSA, chronic bronchitis, and suspected pulmonary hypertension was referred by Dr. Domenic Polite for evaluation of pulmonary hypertension. Patient is on 2L home oxygen and CPAP. He has had gradually worsening exertional dyspnea over the last year.  He was able to mow his grass last fall but cannot now.  He is short of breath after walking about 50-60 feet with his oxygen on.  He is short of breath with any incline.   Echo in 3/19 showed normal EF with PASP 72 mmHg.  I had him do left/right heart cath in 8/19.  This showed no CAD and moderate mixed pulmonary venous/pulmonary arterial hypertension.  Very mildly elevated filling pressures.    He is followed by pulmonary.  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 V/Q scan in 9/19 did not show evidence for chronic PE.   Admitted 7/23 with SBO, treated medically. Has a/c respiratory failure and cardiology consulted. Echo showed EF 70-75%, hyperdynamic EF with moderate LVH, RV normal systolic function, severely elevated pulmonary artery systolic pressures 123456 mm Hg. Discharged home on Lasix 40 mg, weight 273 lbs. Advised HF follow up to discuss RHC.  [place] 8/23 showed near normal filling pressures, severe pulmonary arterial hypertension, concern for group 3 PH with OHS/OSA, but cannot totally rule out group 1 component, and preserved cardiac output. Recommend continue management of OSA/OHS with oxygen/CPAP.  Follow up 12/23, Wilder Glade started but creatinine jumped 1.23-->2.41. Lasix and losartan held x 2 days, then resumed at lower doses. Wilder Glade stopped.  Today he returns for HF follow up. Overall feeling fine. He has dyspnea walking further distances on flat ground with his rolling walker. He wears 5L oxygen at home, CPAP at night. Rare atypical chest pain. Denies palpitations, abnormal  bleeding, dizziness, edema, or PND/Orthopnea. Appetite ok. No fever or chills. Weight at home 284 pounds. Taking all medications. Started going to [employer] but blood sugar started dropping and he had to stop. Plans to restart next week. Home blood sugars 59-200.   ReDs: 38%  ECG (personally reviewed): none ordered today.  Labs (11/18): BNP 21 Labs (7/19): LDL 79 Labs (8/19): K 3.9, creatinine 0.73 Labs (9/19): ACE normal, K 3.8, creatinine 1.13 Labs (7/23): K 3.2, creatinine 1.11 Labs (8/23): K 3.8, creatinine 1.12, BNP 46, LDL 62, TGs 111 Labs (12/23): K 3.8, creatinine 2.05 Labs (1/24): K 4.5, creatinine 1.64  PMH: 1. OHS/OSA: Uses 5L home oxygen and CPAP.  2. Obesity 3. HTN 4. Type II diabetes 5. Chronic bronchitis: ?from welding.  Never smoked.    - CT chest (3/19): coronary calcification, mild emphysema, calcified mediastinal and hilar nodes consistent with prior granulomatous infection.  - PFTs (12/17): FVC 56%, FEV1 57%, ratio 101%, SVC 109%, DLCO 66%.  Moderate obstruction/severe restriction.  6. CAD: Coronary calcification on CT chest 3/19.   - Cardiolite in 2012 showed no ischemia.  - LHC (8/19): No significant CAD.  7. Pulmonary hypertension: Echo (3/19) with EF 65-70%, moderate LVH, PASP 72 mmHg.  - RHC (8/19): mean RA 8, PA 67/24 mean 43, mean PCWP 17, CI 2.99, PVR 3.5 WU.  - V/Q scan (9/19): No evidence for chronic PE.  - Echo (7/23): EF 70-75%, hyperdynamic EF with moderate LVH, RV normal systolic function, PASP 123456 mmHg - RHC (8/23): mean RA 9, PA 75/27 mean 45, mean PCWP 13, CI 2.99, PVR  4.6 WU.  8. 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 Lives in [place].   Family History  Problem Relation Age of Onset   Lung disease Father    Stroke Father 32   Diabetes Father    Diabetes Maternal Grandfather    Heart disease Paternal Grandmother    Stroke Paternal Grandfather 72   Diabetes Paternal Grandfather     Diabetes Mother    Cancer Mother    Stroke Mother 46   Diabetes Sister    Diabetes Brother 36   ROS: All systems reviewed and negative except as per HPI.   Current Outpatient Medications  Medication Sig Dispense Refill   acetaminophen (TYLENOL) 500 MG tablet Take 500 mg by mouth 3 (three) times daily as needed (pain.).     azelastine (ASTELIN) 0.1 % nasal spray 1 puff each nostril twice daily as needed 30 mL 12   Cholecalciferol (VITAMIN D3) 10 MCG (400 UNIT) CAPS Take 1 capsule by mouth daily.     cromolyn (NASALCROM) 5.2 MG/ACT nasal spray Place 1 spray into both nostrils 3 (three) times daily as needed for allergies.      famotidine (PEPCID) 20 MG tablet Take 20 mg by mouth 2 (two) times daily.      furosemide (LASIX) 40 MG tablet Take 1 tablet (40 mg total) by mouth daily. 30 tablet 11   glipiZIDE (GLUCOTROL) 10 MG tablet Take 10 mg by mouth 2 (two) times daily.      HYDROcodone-acetaminophen (NORCO) 10-325 MG tablet Take 1 tablet by mouth 3 (three) times daily as needed for moderate pain.      Krill Oil 500 MG CAPS Take 500 mg by mouth daily.      latanoprost (XALATAN) 0.005 % ophthalmic solution INSTILL 1 DROP IN BOTH EYES EVERY DAY AT BEDTIME 7.5 mL 12   losartan (COZAAR) 50 MG tablet Take 0.5 tablets (25 mg total) by mouth daily. 90 tablet 3   meclizine (ANTIVERT) 25 MG tablet Take 25 mg by mouth 3 (three) times daily as needed for dizziness or nausea (when taking hydrocodone/apap).      meloxicam (MOBIC) 7.5 MG tablet Take 7.5 mg by mouth 2 (two) times daily.     metFORMIN (GLUCOPHAGE-XR) 500 MG 24 hr tablet Take 1,000 mg by mouth 2 (two) times daily.     metoprolol succinate (TOPROL-XL) 50 MG 24 hr tablet Take 1 tablet (50 mg total) by mouth daily. 30 tablet 3   OXYGEN Inhale 5 L/hr into the lungs daily. Pt on 6 liters at home     psyllium (METAMUCIL) 58.6 % powder Take 1 packet by mouth 2 (two) times daily.      spironolactone (ALDACTONE) 25 MG tablet Take 1 tablet (25 mg total)  by mouth daily. 90 tablet 3   trospium (SANCTURA) 20 MG tablet Take 1 tablet (20 mg total) by mouth 2 (two) times daily. 180 tablet 3   LEVEMIR FLEXPEN 100 UNIT/ML FlexPen Inject 25-50 Units into the skin daily as needed (blood sugar greater than 130.). (Patient not taking: Reported on 01/29/2023)     No current facility-administered medications for this encounter.   Wt Readings from Last 3 Encounters:  01/29/23 131.5 kg (290 lb)  01/12/23 128.8 kg (283 lb 15.2 oz)  12/16/22 130.2 kg (287 lb 0.6 oz)   BP (!) 144/80   Pulse 71   Ht 5\' 8"  (1.727 m)   Wt 131.5 kg (290  lb)   SpO2 98%   BMI 44.09 kg/m  Physical Exam General:  NAD. No resp difficulty, walked into clinic with RW on oxygen HEENT: Normal Neck: Supple. JVP 7-8, thick neck. Carotids 2+ bilat; no bruits. No lymphadenopathy or thryomegaly appreciated. Cor: PMI nondisplaced. Regular rate & rhythm. No rubs, gallops or murmurs. Lungs: Clear Abdomen: Soft, obese, nontender, nondistended. No hepatosplenomegaly. No bruits or masses. Good bowel sounds. Extremities: No cyanosis, clubbing, rash, edema Neuro: Alert & oriented x 3, cranial nerves grossly intact. Moves all 4 extremities w/o difficulty. Affect pleasant.  Assessment/Plan: 1. Pulmonary hypertension: Echo in 3/19 showed estimated PA systolic pressure 72.  He has chronic bronchitis as well as OHS/OSA.  Interestingly, chest CT showed calcified mediastinal and hilar nodes suggestive of prior granulomatous infection => could this actually represent sarcoidosis?  ACE level is normal, and per his pulmonologist [name], sarcoidosis is unlikely. [place] 8/19 showed mild pulmonary hypertension, mildly elevated PCWP.  Suspect there is group 2 PH from mildly elevated PCWP but also group 3 PH from OHS/OSA and parenchymal lung disease.  Unlikely group 1.  PVR only 3.5 WU. Echo (7/23) EF 70-75%, hyperdynamic EF with moderate LVH, RV normal systolic function, severely elevated pulmonary artery  systolic pressures 123456 mm Hg.  RHC (8/23) showed near normal filling pressures, severe pulmonary arterial hypertension, suspect primarily group 3 PH with OHS/OSA. Would hold off on selective pulmonary vasodilators. NYHA II-early III functional status confounded by physical deconditioning and body habitus. He is mildly volume overloaded on exam, REDs 38%. - Restart Farxiga 10 mg daily. BMET and BNP today. Will notify PCP, may need to drop glipizide. - Continue Lasix 40 mg daily.  - Continue spironolactone 25 mg daily.  - Continue losartan 25 mg daily (recently reduced with elevated SCr). - Continue pulmonary rehab.  2. CAD: Coronary calcification on 3/19 chest CT.  Patient has a history of chest pain.  LHC (8/19) showed no significant coronary disease. No recent chest pain.  - Continue ASA 81.  - Not on statin with LDL 62 in 8/23.  3. HTN: Mildly elevated today, but generally well-controlled on current regimen. 4. OHS/OSA: Continue oxygen 2L during day, now wearing CPAP.  5. DM2: Per PCP.  - Adding Farxiga. Will touch base with PCP. 6. Obesity: Body mass index is 44.09 kg/m. - Discussed weight loss. - Consider referral to pharmacy for semaglutide.   Follow up in 3 months with Dr. Aundra Dubin.  Maricela Bo [facility] FNP-BC 01/29/2023

## 2023-01-29 ENCOUNTER — Other Ambulatory Visit (HOSPITAL_COMMUNITY): Payer: Self-pay

## 2023-01-29 ENCOUNTER — Encounter (HOSPITAL_COMMUNITY): Payer: Self-pay

## 2023-01-29 ENCOUNTER — Ambulatory Visit (HOSPITAL_COMMUNITY)
Admission: RE | Admit: 2023-01-29 | Discharge: 2023-01-29 | Disposition: A | Discharge status: Home / Self Care | Payer: Medicare (Managed Care) | Source: Ambulatory Visit | Attending: Family Medicine | Admitting: Family Medicine

## 2023-01-29 VITALS — BP 144/80 | HR 71 | Ht 68.0 in | Wt 290.0 lb

## 2023-01-29 DIAGNOSIS — Z6841 Body Mass Index (BMI) 40.0 and over, adult: Secondary | ICD-10-CM | POA: Diagnosis not present

## 2023-01-29 DIAGNOSIS — J449 Chronic obstructive pulmonary disease, unspecified: Secondary | ICD-10-CM | POA: Insufficient documentation

## 2023-01-29 DIAGNOSIS — E669 Obesity, unspecified: Secondary | ICD-10-CM | POA: Insufficient documentation

## 2023-01-29 DIAGNOSIS — G4733 Obstructive sleep apnea (adult) (pediatric): Secondary | ICD-10-CM | POA: Diagnosis not present

## 2023-01-29 DIAGNOSIS — I10 Essential (primary) hypertension: Secondary | ICD-10-CM | POA: Diagnosis not present

## 2023-01-29 DIAGNOSIS — I2510 Atherosclerotic heart disease of native coronary artery without angina pectoris: Secondary | ICD-10-CM | POA: Diagnosis not present

## 2023-01-29 DIAGNOSIS — Z7982 Long term (current) use of aspirin: Secondary | ICD-10-CM | POA: Insufficient documentation

## 2023-01-29 DIAGNOSIS — Z791 Long term (current) use of non-steroidal anti-inflammatories (NSAID): Secondary | ICD-10-CM | POA: Diagnosis not present

## 2023-01-29 DIAGNOSIS — I2721 Secondary pulmonary arterial hypertension: Secondary | ICD-10-CM | POA: Insufficient documentation

## 2023-01-29 DIAGNOSIS — Z79899 Other long term (current) drug therapy: Secondary | ICD-10-CM | POA: Diagnosis not present

## 2023-01-29 DIAGNOSIS — I272 Pulmonary hypertension, unspecified: Secondary | ICD-10-CM | POA: Diagnosis not present

## 2023-01-29 DIAGNOSIS — E119 Type 2 diabetes mellitus without complications: Secondary | ICD-10-CM | POA: Insufficient documentation

## 2023-01-29 DIAGNOSIS — Z7984 Long term (current) use of oral hypoglycemic drugs: Secondary | ICD-10-CM | POA: Diagnosis not present

## 2023-01-29 LAB — BASIC METABOLIC PANEL
Anion gap: 12 (ref 5–15)
BUN: 37 mg/dL — ABNORMAL HIGH (ref 8–23)
CO2: 28 mmol/L (ref 22–32)
Calcium: 9.2 mg/dL (ref 8.9–10.3)
Chloride: 97 mmol/L — ABNORMAL LOW (ref 98–111)
Creatinine, Ser: 1.93 mg/dL — ABNORMAL HIGH (ref 0.61–1.24)
GFR, Estimated: 37 mL/min — ABNORMAL LOW (ref >60)
Glucose, Bld: 156 mg/dL — ABNORMAL HIGH (ref 70–99)
Potassium: 4.6 mmol/L (ref 3.5–5.1)
Sodium: 137 mmol/L (ref 135–145)

## 2023-01-29 LAB — BRAIN NATRIURETIC PEPTIDE: B Natriuretic Peptide: 18.5 pg/mL (ref 0.0–100.0)

## 2023-01-29 MED ORDER — DAPAGLIFLOZIN PROPANEDIOL 10 MG PO TABS: 10.0000 mg | ORAL_TABLET | Freq: Every day | ORAL | 8 refills | Status: DC

## 2023-01-29 NOTE — Patient Instructions (Addendum)
Thank you for coming in today  Labs were done today, if any labs are abnormal the clinic will call you No news is good news  Medications: RESTART Farxiga 10 mg 1 tablet daily     Follow up appointments:  Your physician recommends that you schedule a follow-up appointment in:  3 months with Dr. Kendall Flack will receive a reminder letter in the mail a few months in advance. If you don't receive a letter, please call our office to schedule the follow-up appointment.    Do the following things EVERYDAY: Weigh yourself in the morning before breakfast. Write it down and keep it in a log. Take your medicines as prescribed Eat low salt foods--Limit salt (sodium) to 2000 mg per day.  Stay as active as you can everyday Limit all fluids for the day to less than 2 liters   At the [facility], you and your health needs are our priority. As part of our continuing mission to provide you with exceptional heart care, we have created designated Provider Care Teams. These Care Teams include your primary Cardiologist (physician) and Advanced Practice Providers (APPs- Physician Assistants and Nurse Practitioners) who all work together to provide you with the care you need, when you need it.   You may see any of the following providers on your designated Care Team at your next follow up: Dr Glori Bickers Dr Loralie Champagne Dr. Roxana Hires, NP Lyda Jester, [place] [facility], [place] Forestine Na, NP Audry Riles, PharmD   Please be sure to bring in all your medications bottles to every appointment.    Thank you for choosing [facility]  If you have any questions or concerns before your next appointment please send [place] a message through [place] or call our office at [phone].    TO LEAVE A MESSAGE FOR THE NURSE SELECT OPTION 2, PLEASE LEAVE A MESSAGE INCLUDING: YOUR NAME DATE OF BIRTH CALL BACK  NUMBER REASON FOR CALL**this is important as we prioritize the call backs  YOU WILL RECEIVE A CALL BACK THE SAME DAY AS LONG AS YOU CALL BEFORE 4:00 PM

## 2023-01-29 NOTE — Progress Notes (Signed)
ReDS Vest / Clip - 01/29/23 1158       ReDS Vest / Clip   Station Marker D    Ruler Value 37    ReDS Value Range Moderate volume overload    ReDS Actual Value 38    Anatomical Comments sitting

## 2023-02-02 ENCOUNTER — Telehealth (HOSPITAL_COMMUNITY): Payer: Self-pay | Admitting: Cardiology

## 2023-02-02 ENCOUNTER — Encounter (HOSPITAL_COMMUNITY): Payer: Medicare (Managed Care)

## 2023-02-02 DIAGNOSIS — I50810 Right heart failure, unspecified: Secondary | ICD-10-CM

## 2023-02-02 MED ORDER — FUROSEMIDE 20 MG PO TABS: 20.0000 mg | ORAL_TABLET | Freq: Every day | ORAL | 11 refills | Status: DC

## 2023-02-02 NOTE — Addendum Note (Signed)
Encounter addended by: Donnajean Lopes, RN on: 02/02/2023 1:46 PM  Actions taken: Flowsheet data copied forward, Flowsheet accepted

## 2023-02-02 NOTE — Telephone Encounter (Signed)
Patient called.  Patient aware.  

## 2023-02-02 NOTE — Telephone Encounter (Signed)
-----   Message from Rafael Bihari, [place] sent at 01/29/2023  4:28 PM EDT ----- Decrease lasix to 20 mg daily. Repeat BMET in 2 weeks to follow renal function.

## 2023-02-04 ENCOUNTER — Encounter (HOSPITAL_COMMUNITY)
Admission: RE | Admit: 2023-02-04 | Discharge: 2023-02-04 | Disposition: A | Discharge status: Home / Self Care | Payer: Medicare (Managed Care) | Source: Ambulatory Visit | Attending: Cardiology | Admitting: Cardiology

## 2023-02-04 DIAGNOSIS — J9611 Chronic respiratory failure with hypoxia: Secondary | ICD-10-CM | POA: Insufficient documentation

## 2023-02-04 DIAGNOSIS — I272 Pulmonary hypertension, unspecified: Secondary | ICD-10-CM | POA: Insufficient documentation

## 2023-02-04 NOTE — Progress Notes (Signed)
Daily Session Note  Patient Details  Name: Colton Jackson. MRN: [id number] Date of Birth: Apr 25, 1951 Referring Provider:   Flowsheet Row PULMONARY REHAB OTHER RESPIRATORY from 11/26/2022 in [place]  Referring Provider Dr. Aundra Dubin       Encounter Date: 02/04/2023  Check In:  Session Check In - 02/04/23 1500       Check-In   Supervising physician immediately available to respond to emergencies [facility] MD immediately available    Physician(s) Dr. Harl Bowie    Location [facility]    Staff Present Leana Roe, BS, Exercise Physiologist;Debra Wynetta Emery, RN, Joanette Gula, RN, BSN    Virtual Visit No    Medication changes reported     Yes    Comments 02/03/23 lasix decreased to 20 mg daily and started on Farxiga 30 mg.    Fall or balance concerns reported    Yes    Comments Patient reports impaired balance and ambulates with a walker.    Tobacco Cessation No Change    Warm-up and Cool-down Performed as group-led instruction    Resistance Training Performed Yes    VAD Patient? No    PAD/SET Patient? No      Pain Assessment   Currently in Pain? No/denies    Pain Score 0-No pain    Multiple Pain Sites No             Capillary Blood Glucose: No results found for this or any previous visit (from the past 24 hour(s)).    Social History   Tobacco Use  Smoking Status Former   Types: Cigarettes   Quit date: 1971   Years since quitting: 53.2  Smokeless Tobacco Never  Tobacco Comments   smoked when he was a teenager    Goals Met:  Proper associated with RPD/PD & O2 Sat Independence with exercise equipment Using PLB without cueing & demonstrates good technique Exercise tolerated well Queuing for purse lip breathing No report of concerns or symptoms today Strength training completed today  Goals Unmet:  Not Applicable  Comments: Checkout at 1600.   Dr. Kathie Dike is Medical Director for [facility] Pulmonary Rehab.

## 2023-02-06 NOTE — Progress Notes (Signed)
HPI- M never smoker followed for Chronic Hypoxic Respiratory Failure, Allergic rhinitis, OSA, morbid obesity, obesity/ hypoventilation, complicated by back pain, DM, HBP, glaucoma, pulmonary hypertension( Cardiology) ACE level -08/05/11- WNL 24 ACE level 01/15/21- 14 (9-67) NPSG 09/22/14- AHI 72.7/ hr, CPAP to 19, weight 332 lbs PFT 10/21/16-  severe obstruction and restriction with diffusion relatively high for alveolar volume Echocardiogram 10/12/16- BNP 10/09/17- 20.5   WNL O2 qualifying Walk Test 08/07/20-- desat to 87% on rrom air, 94% on 3L HRCT chest 04/08/20- post infectious or inflammatory scarring and volume loss. There are no specific findings to suggest fibrotic interstitial lung disease.  There are numerous prominent, hyperdense, although not pathologically enlarged mediastinal and hilar lymph nodes, unchanged compared to prior examination and most consistent with nodal involvement of sarcoidosis, although occupational inhalational lung disease may also have this appearance. 4. Coronary artery disease. Aortic Atherosclerosis (ICD10-I70.0). 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 Download- compliance 100%, AHI 6/ hr Body weight today-274 lbs Covid vax- 3 Moderna Flu vax - will get at pharmacy- we are out of senior strength Breathing is comfortable. Use O2 5L at home with long hose. 2L with continuous flow portable tank. Arrival O2 sat 94% today on 2L. Complains of bothersome runny nose- ?vasomotor from O2 flow? R Heart Cath 07/13/22- severe PAH ? Group 3, possible  group 1 component>continue O2, CPAP but consider Tyvaso. Cardiology follows. CXR 06/05/22 1V- IMPRESSION: 1. Cardiomegaly and vascular congestion. 2. Streaky density in the bilateral lungs which could be atelectasis or infection.  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 Download- compliance 97%, AHI 4.8/ hr Body weight today 288 lbs Covid vax- 3 Moderna Flu vax -  Pulmonary Rehab ------Pt is doing well Download reviewed.  Doing well with CPAP plus oxygen at night. Does not feel that his breathing is any different-certainly no worse. He has not been able to lose weight which is a major factor.  ROS-see HPI   += positive Constitutional:    weight loss, night sweats, fevers, chills, fatigue, lassitude. HEENT:    headaches, difficulty swallowing, tooth/dental problems, sore throat,       sneezing, itching, ear ache, nasal congestion, post nasal drip, snoring CV:    chest pain, orthopnea, PND, swelling in lower extremities, anasarca,                                   dizziness, palpitations Resp:  + shortness of breath with exertion or at rest.                productive cough,   non-productive cough, coughing up of blood.              change in color of mucus.  wheezing.   Skin:    rash or lesions. GI:  No-  heartburn, indigestion, abdominal pain, nausea, vomiting, diarrhea,                 change in bowel habits, loss of appetite GU: dysuria, change in color of urine, no urgency or frequency.   flank pain. MS:   +joint pain, stiffness, decreased range of motion, back pain. Neuro-     nothing unusual Psych:  change  in mood or affect.  depression or anxiety.   memory loss.  OBJ- Physical Exam     His O2 2L  + morbidly obese, +on O2 2L tank General- Alert, Oriented, Affect-appropriate, Distress- none acute Skin- rash-none, lesions- none, excoriation- none Lymphadenopathy- none Head- atraumatic            Eyes- Gross vision intact, PERRLA, conjunctivae and secretions clear            Ears- Hearing, canals-normal            Nose- Clear, no-Septal dev, mucus, polyps, erosion, perforation             Throat- Mallampati III , mucosa clear , drainage- none, tonsils- atrophic Neck- flexible , trachea midline, no stridor , thyroid nl, carotid no bruit Chest - symmetrical excursion , unlabored           Heart/CV- RRR , no murmur , no gallop  , no rub, nl s1 s2                           - JVD- none , edema- none, stasis changes- none, varices- none           Lung- clear/distant- I don't hear rales or crackles., wheeze- none, cough- none , dullness-none, rub- none           Chest wall-  Abd-  Br/ Gen/ Rectal- Not done, not indicated Extrem- cyanosis- none, clubbing, none, atrophy- none, strength- nl Neuro- grossly intact to observation

## 2023-02-08 ENCOUNTER — Ambulatory Visit: Payer: Medicare (Managed Care) | Admitting: Internal Medicine

## 2023-02-08 ENCOUNTER — Encounter: Payer: Self-pay | Admitting: Internal Medicine

## 2023-02-08 VITALS — BP 128/80 | HR 84 | Ht 68.0 in | Wt 288.6 lb

## 2023-02-08 DIAGNOSIS — G4733 Obstructive sleep apnea (adult) (pediatric): Secondary | ICD-10-CM

## 2023-02-08 DIAGNOSIS — J9611 Chronic respiratory failure with hypoxia: Secondary | ICD-10-CM

## 2023-02-08 NOTE — Patient Instructions (Signed)
We can continue CPAP 19  Ok to continue your oxygen as you are doing  Please call if we can help

## 2023-02-09 ENCOUNTER — Encounter (HOSPITAL_COMMUNITY)
Admission: RE | Admit: 2023-02-09 | Discharge: 2023-02-09 | Disposition: A | Discharge status: Home / Self Care | Payer: Medicare (Managed Care) | Source: Ambulatory Visit | Attending: Cardiology | Admitting: Cardiology

## 2023-02-09 VITALS — Wt 286.2 lb

## 2023-02-09 DIAGNOSIS — J9611 Chronic respiratory failure with hypoxia: Secondary | ICD-10-CM

## 2023-02-09 DIAGNOSIS — I272 Pulmonary hypertension, unspecified: Secondary | ICD-10-CM | POA: Diagnosis not present

## 2023-02-09 NOTE — Progress Notes (Signed)
Daily Session Note  Patient Details  Name: Colton Jackson. MRN: [id number] Date of Birth: 06-03-1951 Referring Provider:   Flowsheet Row PULMONARY REHAB OTHER RESPIRATORY from 11/26/2022 in [place]  Referring Provider Dr. Aundra Dubin       Encounter Date: 02/09/2023  Check In:  Session Check In - 02/09/23 1500       Check-In   Supervising physician immediately available to respond to emergencies [facility] MD immediately available    Physician(s) Dr Domenic Polite    Location [facility]    Staff Present Leana Roe, BS, Exercise Physiologist;Tanice Petre Hassell Done, RN, BSN;Dalton Sherrie George, MS, ACSM-CEP;Geanie Cooley, RN    Virtual Visit No    Fall or balance concerns reported    Yes    Comments Patient reports impaired balance and ambulates with a walker.    Tobacco Cessation No Change    Warm-up and Cool-down Performed as group-led instruction    Resistance Training Performed Yes    VAD Patient? No    PAD/SET Patient? No      Pain Assessment   Currently in Pain? No/denies    Pain Score 0-No pain    Multiple Pain Sites No             Capillary Blood Glucose: No results found for this or any previous visit (from the past 24 hour(s)).    Social History   Tobacco Use  Smoking Status Former   Types: Cigarettes   Quit date: 1971   Years since quitting: 53.2  Smokeless Tobacco Never  Tobacco Comments   smoked when he was a teenager    Goals Met:  Proper associated with RPD/PD & O2 Sat Independence with exercise equipment Using PLB without cueing & demonstrates good technique Exercise tolerated well Queuing for purse lip breathing No report of concerns or symptoms today Strength training completed today  Goals Unmet:  Not Applicable  Comments: Checkout at 1600.   Dr. Kathie Dike is Medical Director for [facility] Pulmonary Rehab.

## 2023-02-10 NOTE — Progress Notes (Signed)
Pulmonary Individual Treatment Plan  Patient Details  Name: Colton Jackson. MRN: [id number] Date of Birth: October 02, 1951 Referring Provider:   Flowsheet Row PULMONARY REHAB OTHER RESPIRATORY from 11/26/2022 in [place]  Referring Provider Dr. Aundra Dubin       Initial Encounter Date:  Flowsheet Row PULMONARY REHAB OTHER RESPIRATORY from 11/26/2022 in [place]  Date 11/26/22       Visit Diagnosis: Pulmonary HTN ([place])  Chronic respiratory failure with hypoxia ([place])  Pulmonary hypertension ([place])  Patient's Home Medications on Admission:   Current Outpatient Medications:    acetaminophen (TYLENOL) 500 MG tablet, Take 500 mg by mouth 3 (three) times daily as needed (pain.)., Disp: , Rfl:    azelastine (ASTELIN) 0.1 % nasal spray, 1 puff each nostril twice daily as needed, Disp: 30 mL, Rfl: 12   Cholecalciferol (VITAMIN D3) 10 MCG (400 UNIT) CAPS, Take 1 capsule by mouth daily., Disp: , Rfl:    cromolyn (NASALCROM) 5.2 MG/ACT nasal spray, Place 1 spray into both nostrils 3 (three) times daily as needed for allergies. , Disp: , Rfl:    dapagliflozin propanediol (FARXIGA) 10 MG TABS tablet, Take 1 tablet (10 mg total) by mouth daily before breakfast., Disp: 30 tablet, Rfl: 8   famotidine (PEPCID) 20 MG tablet, Take 20 mg by mouth 2 (two) times daily. , Disp: , Rfl:    furosemide (LASIX) 20 MG tablet, Take 1 tablet (20 mg total) by mouth daily., Disp: 30 tablet, Rfl: 11   glipiZIDE (GLUCOTROL) 10 MG tablet, Take 10 mg by mouth 2 (two) times daily. , Disp: , Rfl:    HYDROcodone-acetaminophen (NORCO) 10-325 MG tablet, Take 1 tablet by mouth 3 (three) times daily as needed for moderate pain. , Disp: , Rfl:    Krill Oil 500 MG CAPS, Take 500 mg by mouth daily. , Disp: , Rfl:    latanoprost (XALATAN) 0.005 % ophthalmic solution, INSTILL 1 DROP IN BOTH EYES EVERY DAY AT BEDTIME, Disp: 7.5 mL, Rfl: 12   LEVEMIR FLEXPEN 100 UNIT/ML FlexPen, Inject 25-50  Units into the skin daily as needed (blood sugar greater than 130.)., Disp: , Rfl:    losartan (COZAAR) 50 MG tablet, Take 0.5 tablets (25 mg total) by mouth daily., Disp: 90 tablet, Rfl: 3   meclizine (ANTIVERT) 25 MG tablet, Take 25 mg by mouth 3 (three) times daily as needed for dizziness or nausea (when taking hydrocodone/apap). , Disp: , Rfl:    meloxicam (MOBIC) 7.5 MG tablet, Take 7.5 mg by mouth 2 (two) times daily., Disp: , Rfl:    metFORMIN (GLUCOPHAGE-XR) 500 MG 24 hr tablet, Take 1,000 mg by mouth 2 (two) times daily., Disp: , Rfl:    metoprolol succinate (TOPROL-XL) 50 MG 24 hr tablet, Take 1 tablet (50 mg total) by mouth daily., Disp: 30 tablet, Rfl: 3   OXYGEN, Inhale 5 L/hr into the lungs daily. Pt on 6 liters at home, Disp: , Rfl:    psyllium (METAMUCIL) 58.6 % powder, Take 1 packet by mouth 2 (two) times daily. , Disp: , Rfl:    spironolactone (ALDACTONE) 25 MG tablet, Take 1 tablet (25 mg total) by mouth daily., Disp: 90 tablet, Rfl: 3   trospium (SANCTURA) 20 MG tablet, Take 1 tablet (20 mg total) by mouth 2 (two) times daily., Disp: 180 tablet, Rfl: 3  Past Medical History: Past Medical History:  Diagnosis Date   Allergic rhinitis    Aneurysm artery, neck (HCC)    Dr.  Young pulmonologist   Degenerative disk disease    Essential hypertension    GERD (gastroesophageal reflux disease)    H/O cardiovascular stress test 02/02/11   EF 59% - Normal stress test   Hearing deficit, right    Knee pain, bilateral    Lower back pain    Morbid obesity (HCC)    OSA (obstructive sleep apnea)    On CPAP   Thyroid nodule    Type 2 diabetes mellitus ([place])    Type 2 diabetes mellitus without complication ([place]) 123456   Qualifier: Diagnosis of  By: Annamaria Boots MD, Clinton D     Tobacco Use: Social History   Tobacco Use  Smoking Status Former   Types: Cigarettes   Quit date: 1971   Years since quitting: 53.2  Smokeless Tobacco Never  Tobacco Comments   smoked when he was a  teenager    Labs: Review Flowsheet  More data exists      Latest Ref Rng & Units 06/23/2018 06/04/2022 06/05/2022 07/13/2022 08/04/2022  Labs for ITP Cardiac and Pulmonary Rehab  Cholestrol 0 - 200 mg/dL - - - - 111   LDL (calc) 0 - 99 mg/dL - - - - 62   HDL-C >40 mg/dL - - - - 27   Trlycerides <150 mg/dL - - - - 111   Hemoglobin A1c 4.8 - 5.6 % - 6.9  - - -  Bicarbonate 20.0 - 28.0 mmol/L 20.0 - 28.0 mmol/L 20.0 - 28.0 mmol/L 27.3  28.5  - 36.8  32.8  32.8  32.7  -  TCO2 22 - 32 mmol/L 22 - 32 mmol/L 22 - 32 mmol/L 29  30  - - 35  35  34  -  O2 Saturation % % % 74.0  74.0  - 27.6  71  69  68  -    Capillary Blood Glucose: Lab Results  Component Value Date   GLUCAP 75 01/14/2023   GLUCAP 156 (H) 01/05/2023   GLUCAP 90 12/29/2022   GLUCAP 205 (H) 12/15/2022   GLUCAP 230 (H) 12/08/2022    POCT Glucose     Row Name 12/16/22 0726             POCT Blood Glucose   Pre-Exercise 205 mg/dL                Pulmonary Assessment Scores:  Pulmonary Assessment Scores     Row Name 11/20/22 1447         ADL UCSD   ADL Phase Entry     SOB Score total 31     Rest 0     Walk 3     Stairs 5     Bath 0     Dress 2     Shop 3       CAT Score   CAT Score 10       mMRC Score   mMRC Score 3             UCSD: Self-administered rating of dyspnea associated with activities of daily living (ADLs) 6-point scale (0 = "not at all" to 5 = "maximal or unable to do because of breathlessness")  Scoring Scores range from 0 to 120.  Minimally important difference is 5 units  CAT: CAT can identify the health impairment of COPD patients and is better correlated with disease progression.  CAT has a scoring range of zero to 40. The CAT score is classified into four groups of low (  less than 10), medium (10 - 20), high (21-30) and very high (31-40) based on the impact level of disease on health status. A CAT score over 10 suggests significant symptoms.  A worsening CAT score could  be explained by an exacerbation, poor medication adherence, poor inhaler technique, or progression of COPD or comorbid conditions.  CAT MCID is 2 points  mMRC: mMRC (Modified Medical Research Council) Dyspnea Scale is used to assess the degree of baseline functional disability in patients of respiratory disease due to dyspnea. No minimal important difference is established. A decrease in score of 1 point or greater is considered a positive change.   Pulmonary Function Assessment:   Exercise Target Goals: Exercise Program Goal: Individual exercise prescription set using results from initial 6 min walk test and THRR while considering  patient's activity barriers and safety.   Exercise Prescription Goal: Initial exercise prescription builds to 30-45 minutes a day of aerobic activity, 2-3 days per week.  Home exercise guidelines will be given to patient during program as part of exercise prescription that the participant will acknowledge.  Activity Barriers & Risk Stratification:  Activity Barriers & Cardiac Risk Stratification - 11/20/22 1320       Activity Barriers & Cardiac Risk Stratification   Activity Barriers Arthritis;Back Problems;Shortness of Breath;Balance Concerns;Assistive Device    Cardiac Risk Stratification Moderate             6 Minute Walk:  6 Minute Walk     Row Name 11/26/22 1535         6 Minute Walk   Phase Initial     Distance 650 feet     Walk Time 6 minutes     # of Rest Breaks 1     MPH 1.23     METS 1.14     RPE 12     Perceived Dyspnea  13     VO2 Peak 4     Symptoms Yes (comment)     Comments one sitting break due to SOB and fatigue (1 minute)     Resting HR 72 bpm     Resting BP 130/70     Resting Oxygen Saturation  96 %     Exercise Oxygen Saturation  during 6 min walk 93 %     Max Ex. HR 110 bpm     Max Ex. BP 162/78     2 Minute Post BP 142/78       Interval HR   1 Minute HR 100     2 Minute HR 106     3 Minute HR 111     4  Minute HR 110     5 Minute HR 93     6 Minute HR 109     2 Minute Post HR 87     Interval Heart Rate? Yes       Interval Oxygen   Interval Oxygen? Yes     Baseline Oxygen Saturation % 96 %     1 Minute Oxygen Saturation % 94 %     1 Minute Liters of Oxygen 4 L     2 Minute Oxygen Saturation % 93 %     2 Minute Liters of Oxygen 4 L     3 Minute Oxygen Saturation % 93 %     3 Minute Liters of Oxygen 4 L     4 Minute Oxygen Saturation % 93 %     4 Minute Liters of Oxygen 4 L  5 Minute Oxygen Saturation % 94 %     5 Minute Liters of Oxygen 4 L     6 Minute Oxygen Saturation % 93 %     6 Minute Liters of Oxygen 4 L     2 Minute Post Oxygen Saturation % 98 %     2 Minute Post Liters of Oxygen 4 L              Oxygen Initial Assessment:  Oxygen Initial Assessment - 11/20/22 1446       Home Oxygen   Home Oxygen Device Home Concentrator;E-Tanks    Sleep Oxygen Prescription Continuous    Liters per minute 5    Home Resting Oxygen Prescription Continuous    Liters per minute 5    Compliance with Home Oxygen Use Yes      Intervention   Short Term Goals To learn and exhibit compliance with exercise, home and travel O2 prescription;To learn and understand importance of monitoring SPO2 with pulse oximeter and demonstrate accurate use of the pulse oximeter.;To learn and understand importance of maintaining oxygen saturations>88%;To learn and demonstrate proper pursed lip breathing techniques or other breathing techniques.     Long  Term Goals Exhibits compliance with exercise, home  and travel O2 prescription;Verbalizes importance of monitoring SPO2 with pulse oximeter and return demonstration;Maintenance of O2 saturations>88%;Exhibits proper breathing techniques, such as pursed lip breathing or other method taught during program session             Oxygen Re-Evaluation:  Oxygen Re-Evaluation     Row Name 12/16/22 0725 01/13/23 0743 02/09/23 1617         Program Oxygen  Prescription   Program Oxygen Prescription Continuous Continuous Continuous     Liters per minute 6 6 6        Home Oxygen   Home Oxygen Device Home Concentrator;E-Tanks Home Concentrator;E-Tanks Home Concentrator;E-Tanks     Sleep Oxygen Prescription Continuous Continuous Continuous     Liters per minute 5 5 5      Home Exercise Oxygen Prescription Continuous Continuous Continuous     Liters per minute 6 6 6      Home Resting Oxygen Prescription Continuous Continuous Continuous     Liters per minute 5 5 5      Compliance with Home Oxygen Use Yes Yes Yes       Goals/Expected Outcomes   Short Term Goals To learn and exhibit compliance with exercise, home and travel O2 prescription;To learn and understand importance of monitoring SPO2 with pulse oximeter and demonstrate accurate use of the pulse oximeter.;To learn and understand importance of maintaining oxygen saturations>88%;To learn and demonstrate proper pursed lip breathing techniques or other breathing techniques.  To learn and exhibit compliance with exercise, home and travel O2 prescription;To learn and understand importance of monitoring SPO2 with pulse oximeter and demonstrate accurate use of the pulse oximeter.;To learn and understand importance of maintaining oxygen saturations>88%;To learn and demonstrate proper pursed lip breathing techniques or other breathing techniques.  To learn and exhibit compliance with exercise, home and travel O2 prescription;To learn and understand importance of monitoring SPO2 with pulse oximeter and demonstrate accurate use of the pulse oximeter.;To learn and understand importance of maintaining oxygen saturations>88%;To learn and demonstrate proper pursed lip breathing techniques or other breathing techniques.      Long  Term Goals Exhibits compliance with exercise, home  and travel O2 prescription;Verbalizes importance of monitoring SPO2 with pulse oximeter and return demonstration;Maintenance of O2  saturations>88%;Exhibits proper breathing techniques,  such as pursed lip breathing or other method taught during program session Exhibits compliance with exercise, home  and travel O2 prescription;Verbalizes importance of monitoring SPO2 with pulse oximeter and return demonstration;Maintenance of O2 saturations>88%;Exhibits proper breathing techniques, such as pursed lip breathing or other method taught during program session Exhibits compliance with exercise, home  and travel O2 prescription;Verbalizes importance of monitoring SPO2 with pulse oximeter and return demonstration;Maintenance of O2 saturations>88%;Exhibits proper breathing techniques, such as pursed lip breathing or other method taught during program session     Goals/Expected Outcomes compliance compliance compliance              Oxygen Discharge (Final Oxygen Re-Evaluation):  Oxygen Re-Evaluation - 02/09/23 1617       Program Oxygen Prescription   Program Oxygen Prescription Continuous    Liters per minute 6      Home Oxygen   Home Oxygen Device Home Concentrator;E-Tanks    Sleep Oxygen Prescription Continuous    Liters per minute 5    Home Exercise Oxygen Prescription Continuous    Liters per minute 6    Home Resting Oxygen Prescription Continuous    Liters per minute 5    Compliance with Home Oxygen Use Yes      Goals/Expected Outcomes   Short Term Goals To learn and exhibit compliance with exercise, home and travel O2 prescription;To learn and understand importance of monitoring SPO2 with pulse oximeter and demonstrate accurate use of the pulse oximeter.;To learn and understand importance of maintaining oxygen saturations>88%;To learn and demonstrate proper pursed lip breathing techniques or other breathing techniques.     Long  Term Goals Exhibits compliance with exercise, home  and travel O2 prescription;Verbalizes importance of monitoring SPO2 with pulse oximeter and return demonstration;Maintenance of O2  saturations>88%;Exhibits proper breathing techniques, such as pursed lip breathing or other method taught during program session    Goals/Expected Outcomes compliance             Initial Exercise Prescription:  Initial Exercise Prescription - 11/26/22 1500       Date of Initial Exercise RX and Referring Provider   Date 11/26/22    Referring Provider Dr. Aundra Dubin    Expected Discharge Date 03/25/23      Oxygen   Oxygen Continuous    Liters 4    Maintain Oxygen Saturation 88% or higher      NuStep   Level 1    SPM 80    Minutes 39      Prescription Details   Frequency (times per week) 2    Duration Progress to 30 minutes of continuous aerobic without signs/symptoms of physical distress      Intensity   THRR 40-80% of Max Heartrate 60-120    Ratings of Perceived Exertion 11-13      Resistance Training   Training Prescription Yes    Weight 0    Reps 10-15             Perform Capillary Blood Glucose checks as needed.  Exercise Prescription Changes:   Exercise Prescription Changes     Row Name 12/01/22 1500 12/16/22 0700 01/12/23 1500 01/14/23 1500 02/09/23 1600     Response to Exercise   Blood Pressure (Admit) 126/70 142/74 158/78 148/70 156/84   Blood Pressure (Exercise) 148/70 142/74 160/66 160/66 150/70   Blood Pressure (Exit) 158/78 138/84 140/60 146/70 122/62   Heart Rate (Admit) 64 bpm 86 bpm 81 bpm 77 bpm 90 bpm   Heart Rate (Exercise) 88 bpm  92 bpm 83 bpm 90 bpm 88 bpm   Heart Rate (Exit) 74 bpm 85 bpm 74 bpm 80 bpm 81 bpm   Oxygen Saturation (Admit) 97 % 96 % 95 % 96 % 98 %   Oxygen Saturation (Exercise) 94 % 94 % 92 % 92 % 96 %   Oxygen Saturation (Exit) 98 % 98 % 97 % 94 % 99 %   Rating of Perceived Exertion (Exercise) 12 12 12 13 12    Perceived Dyspnea (Exercise) 12 12 12 11 12    Duration Continue with 30 min of aerobic exercise without signs/symptoms of physical distress. Continue with 30 min of aerobic exercise without signs/symptoms of  physical distress. Continue with 30 min of aerobic exercise without signs/symptoms of physical distress. Continue with 30 min of aerobic exercise without signs/symptoms of physical distress. Continue with 30 min of aerobic exercise without signs/symptoms of physical distress.   Intensity THRR unchanged THRR unchanged THRR unchanged THRR unchanged THRR unchanged     Progression   Progression Continue to progress workloads to maintain intensity without signs/symptoms of physical distress. Continue to progress workloads to maintain intensity without signs/symptoms of physical distress. Continue to progress workloads to maintain intensity without signs/symptoms of physical distress. Continue to progress workloads to maintain intensity without signs/symptoms of physical distress. Continue to progress workloads to maintain intensity without signs/symptoms of physical distress.     Resistance Training   Training Prescription Yes Yes Yes Yes Yes   Weight 0 0 0 0 0   Reps 10-15 10-15 10-15 10-15 10-15   Time 10 Minutes 10 Minutes 10 Minutes 10 Minutes 10 Minutes     Oxygen   Oxygen Continuous Continuous Continuous Continuous Continuous   Liters 6 6 6 6 6      NuStep   Level 1 2 3 3 3    SPM 81 101 90 105 104   Minutes 39 39 39 39 39   METs 2 2.2 2.1 2.4 2.4     Oxygen   Maintain Oxygen Saturation 88% or higher 88% or higher -- -- --            Exercise Comments:   Exercise Goals and Review:   Exercise Goals     Row Name 11/26/22 1540 12/16/22 0729 01/13/23 0738 02/09/23 1611       Exercise Goals   Increase Physical Activity Yes Yes Yes Yes    Intervention Provide advice, education, support and counseling about physical activity/exercise needs.;Develop an individualized exercise prescription for aerobic and resistive training based on initial evaluation findings, risk stratification, comorbidities and participant's personal goals. Provide advice, education, support and counseling about  physical activity/exercise needs.;Develop an individualized exercise prescription for aerobic and resistive training based on initial evaluation findings, risk stratification, comorbidities and participant's personal goals. Provide advice, education, support and counseling about physical activity/exercise needs.;Develop an individualized exercise prescription for aerobic and resistive training based on initial evaluation findings, risk stratification, comorbidities and participant's personal goals. Provide advice, education, support and counseling about physical activity/exercise needs.;Develop an individualized exercise prescription for aerobic and resistive training based on initial evaluation findings, risk stratification, comorbidities and participant's personal goals.    Expected Outcomes Short Term: Attend rehab on a regular basis to increase amount of physical activity.;Long Term: Add in home exercise to make exercise part of routine and to increase amount of physical activity.;Long Term: Exercising regularly at least 3-5 days a week. Short Term: Attend rehab on a regular basis to increase amount of physical activity.;Long Term:  Add in home exercise to make exercise part of routine and to increase amount of physical activity.;Long Term: Exercising regularly at least 3-5 days a week. Short Term: Attend rehab on a regular basis to increase amount of physical activity.;Long Term: Add in home exercise to make exercise part of routine and to increase amount of physical activity.;Long Term: Exercising regularly at least 3-5 days a week. Short Term: Attend rehab on a regular basis to increase amount of physical activity.;Long Term: Add in home exercise to make exercise part of routine and to increase amount of physical activity.;Long Term: Exercising regularly at least 3-5 days a week.    Increase Strength and Stamina Yes Yes Yes Yes    Intervention Provide advice, education, support and counseling about physical  activity/exercise needs.;Develop an individualized exercise prescription for aerobic and resistive training based on initial evaluation findings, risk stratification, comorbidities and participant's personal goals. Provide advice, education, support and counseling about physical activity/exercise needs.;Develop an individualized exercise prescription for aerobic and resistive training based on initial evaluation findings, risk stratification, comorbidities and participant's personal goals. Provide advice, education, support and counseling about physical activity/exercise needs.;Develop an individualized exercise prescription for aerobic and resistive training based on initial evaluation findings, risk stratification, comorbidities and participant's personal goals. Provide advice, education, support and counseling about physical activity/exercise needs.;Develop an individualized exercise prescription for aerobic and resistive training based on initial evaluation findings, risk stratification, comorbidities and participant's personal goals.    Expected Outcomes Short Term: Increase workloads from initial exercise prescription for resistance, speed, and METs.;Short Term: Perform resistance training exercises routinely during rehab and add in resistance training at home;Long Term: Improve cardiorespiratory fitness, muscular endurance and strength as measured by increased METs and functional capacity (6MWT) Short Term: Increase workloads from initial exercise prescription for resistance, speed, and METs.;Short Term: Perform resistance training exercises routinely during rehab and add in resistance training at home;Long Term: Improve cardiorespiratory fitness, muscular endurance and strength as measured by increased METs and functional capacity (6MWT) Short Term: Increase workloads from initial exercise prescription for resistance, speed, and METs.;Short Term: Perform resistance training exercises routinely during rehab  and add in resistance training at home;Long Term: Improve cardiorespiratory fitness, muscular endurance and strength as measured by increased METs and functional capacity (6MWT) Short Term: Increase workloads from initial exercise prescription for resistance, speed, and METs.;Short Term: Perform resistance training exercises routinely during rehab and add in resistance training at home;Long Term: Improve cardiorespiratory fitness, muscular endurance and strength as measured by increased METs and functional capacity (6MWT)    Able to understand and use rate of perceived exertion (RPE) scale Yes Yes Yes Yes    Intervention Provide education and explanation on how to use RPE scale Provide education and explanation on how to use RPE scale Provide education and explanation on how to use RPE scale Provide education and explanation on how to use RPE scale    Expected Outcomes Short Term: Able to use RPE daily in rehab to express subjective intensity level;Long Term:  Able to use RPE to guide intensity level when exercising independently Short Term: Able to use RPE daily in rehab to express subjective intensity level;Long Term:  Able to use RPE to guide intensity level when exercising independently Short Term: Able to use RPE daily in rehab to express subjective intensity level;Long Term:  Able to use RPE to guide intensity level when exercising independently Short Term: Able to use RPE daily in rehab to express subjective intensity level;Long Term:  Able to  use RPE to guide intensity level when exercising independently    Able to understand and use Dyspnea scale -- -- -- Yes    Intervention -- -- -- Provide education and explanation on how to use Dyspnea scale    Expected Outcomes -- -- -- Short Term: Able to use Dyspnea scale daily in rehab to express subjective sense of shortness of breath during exertion;Long Term: Able to use Dyspnea scale to guide intensity level when exercising independently    Knowledge and  understanding of Target Heart Rate Range (THRR) Yes Yes Yes Yes    Intervention Provide education and explanation of THRR including how the numbers were predicted and where they are located for reference Provide education and explanation of THRR including how the numbers were predicted and where they are located for reference Provide education and explanation of THRR including how the numbers were predicted and where they are located for reference Provide education and explanation of THRR including how the numbers were predicted and where they are located for reference    Expected Outcomes Short Term: Able to state/look up THRR;Short Term: Able to use daily as guideline for intensity in rehab;Long Term: Able to use THRR to govern intensity when exercising independently Short Term: Able to state/look up THRR;Short Term: Able to use daily as guideline for intensity in rehab;Long Term: Able to use THRR to govern intensity when exercising independently Short Term: Able to state/look up THRR;Short Term: Able to use daily as guideline for intensity in rehab;Long Term: Able to use THRR to govern intensity when exercising independently Short Term: Able to state/look up THRR;Short Term: Able to use daily as guideline for intensity in rehab;Long Term: Able to use THRR to govern intensity when exercising independently    Able to check pulse independently Yes Yes Yes --    Intervention Provide education and demonstration on how to check pulse in carotid and radial arteries.;Review the importance of being able to check your own pulse for safety during independent exercise Provide education and demonstration on how to check pulse in carotid and radial arteries.;Review the importance of being able to check your own pulse for safety during independent exercise Provide education and demonstration on how to check pulse in carotid and radial arteries.;Review the importance of being able to check your own pulse for safety during  independent exercise --    Expected Outcomes Short Term: Able to explain why pulse checking is important during independent exercise;Long Term: Able to check pulse independently and accurately Short Term: Able to explain why pulse checking is important during independent exercise;Long Term: Able to check pulse independently and accurately Short Term: Able to explain why pulse checking is important during independent exercise;Long Term: Able to check pulse independently and accurately --    Understanding of Exercise Prescription Yes Yes Yes Yes    Intervention Provide education, explanation, and written materials on patient's individual exercise prescription Provide education, explanation, and written materials on patient's individual exercise prescription Provide education, explanation, and written materials on patient's individual exercise prescription Provide education, explanation, and written materials on patient's individual exercise prescription    Expected Outcomes Short Term: Able to explain program exercise prescription;Long Term: Able to explain home exercise prescription to exercise independently Short Term: Able to explain program exercise prescription;Long Term: Able to explain home exercise prescription to exercise independently Short Term: Able to explain program exercise prescription;Long Term: Able to explain home exercise prescription to exercise independently Short Term: Able to explain program exercise prescription;Long Term:  Able to explain home exercise prescription to exercise independently             Exercise Goals Re-Evaluation :  Exercise Goals Re-Evaluation     Row Name 12/16/22 0729 01/13/23 0739 02/09/23 1611         Exercise Goal Re-Evaluation   Exercise Goals Review Increase Physical Activity;Increase Strength and Stamina;Able to understand and use rate of perceived exertion (RPE) scale;Able to understand and use Dyspnea scale;Knowledge and understanding of Target  Heart Rate Range (THRR);Understanding of Exercise Prescription Increase Physical Activity;Increase Strength and Stamina;Able to understand and use rate of perceived exertion (RPE) scale;Able to understand and use Dyspnea scale;Knowledge and understanding of Target Heart Rate Range (THRR);Understanding of Exercise Prescription Increase Physical Activity;Increase Strength and Stamina;Able to understand and use rate of perceived exertion (RPE) scale;Able to understand and use Dyspnea scale;Knowledge and understanding of Target Heart Rate Range (THRR);Understanding of Exercise Prescription     Comments Pt has completed 7 sessions of PR. His blood sugar has been more consistent over the past few sessions, as he had two episodes of hypoglycemia in the early part of the program. He has been able to progress his workload, and he is motivated while he is in class. He is currently exercising at 2.2 METs on the stepper. Will continue to monitor and progress as able. Pt has completed 13 sessiosn of PR. His blood sugar continues to be consistent the past sessions and hes able to attend class compare to the first couple sessions. He is tolerating exercise well and has increased his workload. He recently complained of back pain over the weekend and he thinks it may be due to exercise but after asking more questions he was out shopping and walking around that weekend as well which would contribute to the over use. He is currently exercising at 2.1 METs on the stepper. Will continue to monitor and progress as able. Pt has completed 16 sessions of PR. He did not come to class for 2 weeks due to his blood sugar and doctor visits. His blood sugar has been within the limits for exercise since he came back last week. He is tolerating exericse well and is pushing himself to reach small goals he sets, laps per class. He is currently exercising at 2.4 METs on the stepper. Will continue to monitor and progress as able.     Expected  Outcomes Through exercise at rehab and at home, the patient will meet their stated goals. Through exercise at rehab and at home, the patient will meet their stated goals. Through exercise at rehab and at home, the patient will meet their stated goals.              Discharge Exercise Prescription (Final Exercise Prescription Changes):  Exercise Prescription Changes - 02/09/23 1600       Response to Exercise   Blood Pressure (Admit) 156/84    Blood Pressure (Exercise) 150/70    Blood Pressure (Exit) 122/62    Heart Rate (Admit) 90 bpm    Heart Rate (Exercise) 88 bpm    Heart Rate (Exit) 81 bpm    Oxygen Saturation (Admit) 98 %    Oxygen Saturation (Exercise) 96 %    Oxygen Saturation (Exit) 99 %    Rating of Perceived Exertion (Exercise) 12    Perceived Dyspnea (Exercise) 12    Duration Continue with 30 min of aerobic exercise without signs/symptoms of physical distress.    Intensity THRR unchanged  Progression   Progression Continue to progress workloads to maintain intensity without signs/symptoms of physical distress.      Resistance Training   Training Prescription Yes    Weight 0    Reps 10-15    Time 10 Minutes      Oxygen   Oxygen Continuous    Liters 6      NuStep   Level 3    SPM 104    Minutes 39    METs 2.4             Nutrition:  Target Goals: Understanding of nutrition guidelines, daily intake of sodium 1500mg , cholesterol 200mg , calories 30% from fat and 7% or less from saturated fats, daily to have 5 or more servings of fruits and vegetables.  Biometrics:  Pre Biometrics - 11/26/22 1541       Pre Biometrics   Height 5\' 8"  (1.727 m)    Weight 131.7 kg    Waist Circumference 56 inches    Hip Circumference 55 inches    Waist to Hip Ratio 1.02 %    BMI (Calculated) 44.16    Triceps Skinfold 40 mm    % Body Fat 45.6 %    Grip Strength 31.3 kg    Flexibility 0 in    Single Leg Stand 0 seconds              Nutrition Therapy  Plan and Nutrition Goals:  Nutrition Therapy & Goals - 11/20/22 1431       Personal Nutrition Goals   Additional Goals? No    Comments Patient scored 43 on his diet assessment. He cooks for himself mostly. Handouts provided and explained on heathier choices and DM. We offer 2 educational sessions on heart healthy nutrition with handouts and assistance with RD referral if patient is interested.      Intervention Plan   Intervention Nutrition handout(s) given to patient.    Expected Outcomes Short Term Goal: Understand basic principles of dietary content, such as calories, fat, sodium, cholesterol and nutrients.             Nutrition Assessments:  Nutrition Assessments - 11/20/22 1430       MEDFICTS Scores   Pre Score 43            MEDIFICTS Score Key: ?70 Need to make dietary changes  40-70 Heart Healthy Diet ? 40 Therapeutic Level Cholesterol Diet   Picture Your Plate Scores: [id number] Unhealthy dietary pattern with much room for improvement. 41-50 Dietary pattern unlikely to meet recommendations for good health and room for improvement. 51-60 More healthful dietary pattern, with some room for improvement.  >60 Healthy dietary pattern, although there may be some specific behaviors that could be improved.    Nutrition Goals Re-Evaluation:   Nutrition Goals Discharge (Final Nutrition Goals Re-Evaluation):   Psychosocial: Target Goals: Acknowledge presence or absence of significant depression and/or stress, maximize coping skills, provide positive support system. Participant is able to verbalize types and ability to use techniques and skills needed for reducing stress and depression.  Initial Review & Psychosocial Screening:  Initial Psych Review & Screening - 11/20/22 1436       Initial Review   Current issues with None Identified      Family Dynamics   Good Support System? Yes      Barriers   Psychosocial barriers to participate in program There are no  identifiable barriers or psychosocial needs.      Screening Interventions  Interventions Encouraged to exercise;Provide feedback about the scores to participant    Expected Outcomes Short Term goal: Identification and review with participant of any Quality of Life or Depression concerns found by scoring the questionnaire.             Quality of Life Scores:  Quality of Life - 12/07/22 0901       Quality of Life   Select Quality of Life      Quality of Life Scores   Health/Function Pre 13.13 %    Socioeconomic Pre 24.88 %    Psych/Spiritual Pre 23.07 %    Family Pre 22 %    GLOBAL Pre 18.07 %            Scores of 19 and below usually indicate a poorer quality of life in these areas.  A difference of  2-3 points is a clinically meaningful difference.  A difference of 2-3 points in the total score of the Quality of Life Index has been associated with significant improvement in overall quality of life, self-image, physical symptoms, and general health in studies assessing change in quality of life.   PHQ-9: Review Flowsheet       11/20/2022  Depression screen PHQ 2/9  Decreased Interest 0  Down, Depressed, Hopeless 0  PHQ - 2 Score 0  Altered sleeping 0  Tired, decreased energy 2  Change in appetite 0  Feeling bad or failure about yourself  0  Trouble concentrating 0  Moving slowly or fidgety/restless 0  Suicidal thoughts 0  PHQ-9 Score 2  Difficult doing work/chores Very difficult   Interpretation of Total Score  Total Score Depression Severity:  1-4 = Minimal depression, 5-9 = Mild depression, 10-14 = Moderate depression, 15-19 = Moderately severe depression, 20-27 = Severe depression   Psychosocial Evaluation and Intervention:  Psychosocial Evaluation - 11/20/22 1437       Psychosocial Evaluation & Interventions   Interventions Encouraged to exercise with the program and follow exercise prescription;Stress management education;Relaxation education     Comments Patient has no psychosocial barriers or needs identified at his orientation visit. His PHQ-9 score was 2 due to lack of energy. He lives alone. He has a daughter and 2 sons. He says he does not have a good relationship with his sons but his daugther is supportative of him and he also has a few friends that support him. He denies any depression or anxiety but does say he gets lonely since his wife died in 2009/03/04. He says he is not able to do the things he used to do like fishing and is not able to do his house cleaning which is frusturating for him. He was not able to complete his walk test today due to low CBG. He says he only eats 2 meals/day and was encouraged to eat 3 meals/day wtih snacks. He says he does have a lot of hypoglycemic episodes at night. I encouraged him to eat a high protein snack before he goes to bed. He verbalized understanding. He is currently not monitoring his glucose. He had a free style monitor but it stopped working and he is looking for another. He wants to participate in PR hope to gain strength and endurance and improve his walking.    Expected Outcomes Patient will continue to have no psychosocial barriers identified.    Continue Psychosocial Services  No Follow up required             Psychosocial Re-Evaluation:  Psychosocial Re-Evaluation  [place] Name 12/07/22 [id number] 01/04/23 0856 02/02/23 1334         Psychosocial Re-Evaluation   Current issues with None Identified None Identified --     Comments Patient is new to program attending 3 sessions.  His intital QOL  score is 8.07 and his PHQ score is 2 .  He seems to enjoy coming to class and interactive with class and staff. Patient demonstrates an interest in the program and improving his health .   Patient was referred to PR by Dr. Aundra Dubin with a diagnosis of Pulmonary HTN and chronic resp failure with hypoxia.  We will continue to monitor as he progresses in the program. Patient has coompleted 10 sessions.   He  seems to enjoy coming to class and interactive well with class and staff.  Patient demonstrates an interest in the program and improving his health .   Patient was referred to PR by Dr. Aundra Dubin with a diagnosis of Pulmonary HTN and chronic resp failure with hypoxia.  We will continue to monitor as he progresses in the program. Patient has coompleted 14 sessions.   He seems to enjoy coming to class and interactive well with class and staff.  Patient demonstrates an interest in the program and improving his health .   Patient was referred to PR by Dr. Aundra Dubin with a diagnosis of Pulmonary HTN and chronic resp failure with hypoxia.  We will continue to monitor as he progresses in the program.  Has been absent for 2 1/2 weeks, due to BS being low .   Plans to come back this week.     Expected Outcomes Patient will have no psychosocial barriers identified at discharge. Patient will have no psychosocial barriers identified at discharge. Patient will have no psychosocial barriers identified at discharge.     Interventions Stress management education;Encouraged to attend Pulmonary Rehabilitation for the exercise;Relaxation education Stress management education;Encouraged to attend Pulmonary Rehabilitation for the exercise;Relaxation education Stress management education;Encouraged to attend Pulmonary Rehabilitation for the exercise;Relaxation education     Continue Psychosocial Services  No Follow up required No Follow up required No Follow up required              Psychosocial Discharge (Final Psychosocial Re-Evaluation):  Psychosocial Re-Evaluation - 02/02/23 1334       Psychosocial Re-Evaluation   Comments Patient has coompleted 14 sessions.   He seems to enjoy coming to class and interactive well with class and staff.  Patient demonstrates an interest in the program and improving his health .   Patient was referred to PR by Dr. Aundra Dubin with a diagnosis of Pulmonary HTN and chronic resp failure with hypoxia.   We will continue to monitor as he progresses in the program.  Has been absent for 2 1/2 weeks, due to BS being low .   Plans to come back this week.    Expected Outcomes Patient will have no psychosocial barriers identified at discharge.    Interventions Stress management education;Encouraged to attend Pulmonary Rehabilitation for the exercise;Relaxation education    Continue Psychosocial Services  No Follow up required              Education: Education Goals: Education classes will be provided on a weekly basis, covering required topics. Participant will state understanding/return demonstration of topics presented.  Learning Barriers/Preferences:  Learning Barriers/Preferences - 11/20/22 1432       Learning Barriers/Preferences   Learning Preferences Written Material;Audio;Skilled Demonstration  Education Topics: How Lungs Work and Diseases: - Discuss the anatomy of the lungs and diseases that can affect the lungs, such as COPD.   Exercise: -Discuss the importance of exercise, FITT principles of exercise, normal and abnormal responses to exercise, and how to exercise safely.   Environmental Irritants: -Discuss types of environmental irritants and how to limit exposure to environmental irritants. Flowsheet Row PULMONARY REHAB OTHER RESPIRATORY from 02/04/2023 in [place]  Date 11/26/22  Educator HB  Instruction Review Code 1- Verbalizes Understanding       Meds/Inhalers and oxygen: - Discuss respiratory medications, definition of an inhaler and oxygen, and the proper way to use an inhaler and oxygen. Flowsheet Row PULMONARY REHAB OTHER RESPIRATORY from 02/04/2023 in [place]  Date 12/03/22  Educator DM       Energy Saving Techniques: - Discuss methods to conserve energy and decrease shortness of breath when performing activities of daily living.  Flowsheet Row PULMONARY REHAB OTHER RESPIRATORY from  02/04/2023 in [place]  Date 12/10/22  Educator HB  Instruction Review Code 1- Verbalizes Understanding       Bronchial Hygiene / Breathing Techniques: - Discuss breathing mechanics, pursed-lip breathing technique,  proper posture, effective ways to clear airways, and other functional breathing techniques Flowsheet Row PULMONARY REHAB OTHER RESPIRATORY from 02/04/2023 in [place]  Date 12/17/22  Educator HB  Instruction Review Code 1- Research scientist (medical): - Provides group verbal and written instruction about the health risks of elevated stress, cause of high stress, and healthy ways to reduce stress. Flowsheet Row PULMONARY REHAB OTHER RESPIRATORY from 02/04/2023 in [place]  Date 12/24/22  Educator HB  Instruction Review Code 1- Verbalizes Understanding       Nutrition I: Fats: - Discuss the types of cholesterol, what cholesterol does to the body, and how cholesterol levels can be controlled. Flowsheet Row PULMONARY REHAB OTHER RESPIRATORY from 02/04/2023 in [place]  Date 12/31/22  Educator Handout       Nutrition II: Labels: -Discuss the different components of food labels and how to read food labels. Flowsheet Row PULMONARY REHAB OTHER RESPIRATORY from 02/04/2023 in [place]  Date 01/07/23  Educator HB  Instruction Review Code 1- Verbalizes Understanding       Respiratory Infections: - Discuss the signs and symptoms of respiratory infections, ways to prevent respiratory infections, and the importance of seeking medical treatment when having a respiratory infection.   Stress I: Signs and Symptoms: - Discuss the causes of stress, how stress may lead to anxiety and depression, and ways to limit stress.   Stress II: Relaxation: -Discuss relaxation techniques to limit stress. Flowsheet Row PULMONARY REHAB OTHER  RESPIRATORY from 02/04/2023 in [place]  Date 02/04/23  Educator Handout       Oxygen for Home/Travel: - Discuss how to prepare for travel when on oxygen and proper ways to transport and store oxygen to ensure safety.   Knowledge Questionnaire Score:  Knowledge Questionnaire Score - 11/20/22 1432       Knowledge Questionnaire Score   Pre Score 10/18             Core Components/Risk Factors/Patient Goals at Admission:  Personal Goals and Risk Factors at Admission - 11/20/22 1433       Core Components/Risk Factors/Patient Goals on Admission    Weight Management Obesity    Improve  shortness of breath with ADL's Yes    Intervention Provide education, individualized exercise plan and daily activity instruction to help decrease symptoms of SOB with activities of daily living.    Expected Outcomes Short Term: Improve cardiorespiratory fitness to achieve a reduction of symptoms when performing ADLs;Long Term: Be able to perform more ADLs without symptoms or delay the onset of symptoms    Diabetes Yes    Intervention Provide education about signs/symptoms and action to take for hypo/hyperglycemia.;Provide education about proper nutrition, including hydration, and aerobic/resistive exercise prescription along with prescribed medications to achieve blood glucose in normal ranges: Fasting glucose 65-99 mg/dL    Expected Outcomes Short Term: Participant verbalizes understanding of the signs/symptoms and immediate care of hyper/hypoglycemia, proper foot care and importance of medication, aerobic/resistive exercise and nutrition plan for blood glucose control.;Long Term: Attainment of HbA1C < 7%.    Personal Goal Other Yes    Personal Goal Patient wants to improve his strength and endurance and be able to longer distances.    Intervention Patient will attend PR 2 days/week with exercise and education.    Expected Outcomes Patient will complete the program meeting both  personal and program goals.             Core Components/Risk Factors/Patient Goals Review:   Goals and Risk Factor Review     Row Name 12/07/22 1309 01/04/23 0858 02/02/23 1342         Core Components/Risk Factors/Patient Goals Review   Personal Goals Review Develop more efficient breathing techniques such as purse lipped breathing and diaphragmatic breathing and practicing self-pacing with activity.;Improve shortness of breath with ADL's;Other Develop more efficient breathing techniques such as purse lipped breathing and diaphragmatic breathing and practicing self-pacing with activity.;Improve shortness of breath with ADL's;Other Develop more efficient breathing techniques such as purse lipped breathing and diaphragmatic breathing and practicing self-pacing with activity.;Improve shortness of breath with ADL's;Other     Review Patient is new to the program completing 3 sessions.  He was referred to PR by Aundra Dubin with a diagnosis of Pulmonary HTN and chronic resp failure with hypoxia.  We will help him with deveoping a efficient breathing technique such as purse lipped breathing and pratice self pacing with increased activity.  When exercising increases O2 up 6L averaging between 94%-97% O2 sats.  Patient tolerate exercise well.  His glucose readings have been within our exercise parameters.  His  personal goals wants to increase strength and endurance, working toward walking longer distances.  We will continue to monitor as he works toward meeting these goals. Patient has completed 10 sessions.  We will help him with deveoping a efficient breathing technique such as purse lipped breathing and pratice self pacing with increased activity.  Patient continues to tolerate exercise well and while exercising increases O2 up 6L averaging between 90%-97% O2 sats.  Patient tolerate exercise well.  His glucose readings have been up and down,  not controlled with diet, will encourage better meal planning.  His   personal goals wants to increase strength and endurance, working toward walking longer distances.  We will continue to monitor as he works toward meeting these goals. Patient has completed 14 sessions.  Has been absent for 2 1/2 weeks, due to low BS.  We will continue to monitor his BS prior to exercise.   Plans to return this week. We will help him with deveoping a efficient breathing technique such as purse lipped breathing and pratice self pacing with increased activity.  Patient continues to tolerate exercise well and while exercising increases O2 up 6L averaging between 90%-97% O2 sats.  Patient tolerate exercise well.   His  personal goals wants to increase strength and endurance, working toward walking longer distances.   We will continue to monitor as he works toward meeting these goals.     Expected Outcomes Patient will complete the program meeting both program and personal goals. Patient will complete the program meeting both program and personal goals. Patient will complete the program meeting both program and personal goals.              Core Components/Risk Factors/Patient Goals at Discharge (Final Review):   Goals and Risk Factor Review - 02/02/23 1342       Core Components/Risk Factors/Patient Goals Review   Personal Goals Review Develop more efficient breathing techniques such as purse lipped breathing and diaphragmatic breathing and practicing self-pacing with activity.;Improve shortness of breath with ADL's;Other    Review Patient has completed 14 sessions.  Has been absent for 2 1/2 weeks, due to low BS.  We will continue to monitor his BS prior to exercise.   Plans to return this week. We will help him with deveoping a efficient breathing technique such as purse lipped breathing and pratice self pacing with increased activity.  Patient continues to tolerate exercise well and while exercising increases O2 up 6L averaging between 90%-97% O2 sats.  Patient tolerate exercise well.    His  personal goals wants to increase strength and endurance, working toward walking longer distances.   We will continue to monitor as he works toward meeting these goals.    Expected Outcomes Patient will complete the program meeting both program and personal goals.             ITP Comments:  ITP Comments     Row Name 12/29/22 1509 12/31/22 1523 01/28/23 1500       ITP Comments CBG increased to 90, pt sent home.  Pt to re-check when he gets home, follow up with PCP as needed. CBG has increased to 106, then 113.  Tolerating exercise session well. Left patient VM due to his absences the past two weeks. Requested pt call us back.              Comments: Marland KitchenMarland KitchenITP REVIEW Pt is making expected progress toward pulmonary rehab goals after completing 17 sessions. Recommend continued exercise, life style modification, education, and utilization of breathing techniques to increase stamina and strength and decrease shortness of breath with exertion.

## 2023-02-11 ENCOUNTER — Encounter (HOSPITAL_COMMUNITY)
Admission: RE | Admit: 2023-02-11 | Discharge: 2023-02-11 | Disposition: A | Discharge status: Home / Self Care | Payer: Medicare (Managed Care) | Source: Ambulatory Visit | Attending: Cardiology | Admitting: Cardiology

## 2023-02-11 DIAGNOSIS — I272 Pulmonary hypertension, unspecified: Secondary | ICD-10-CM

## 2023-02-11 DIAGNOSIS — J9611 Chronic respiratory failure with hypoxia: Secondary | ICD-10-CM

## 2023-02-11 NOTE — Progress Notes (Signed)
Daily Session Note  Patient Details  Name: Colton Jackson. MRN: [id number] Date of Birth: 01/12/1951 Referring Provider:   Flowsheet Row PULMONARY REHAB OTHER RESPIRATORY from 11/26/2022 in [place]  Referring Provider Dr. Aundra Dubin       Encounter Date: 02/11/2023  Check In:  Session Check In - 02/11/23 1456       Check-In   Supervising physician immediately available to respond to emergencies [facility] MD immediately available    Physician(s) Dr Harl Bowie    Location [facility]    Staff Present Leana Roe, BS, Exercise Physiologist;Derick Seminara Hassell Done, RN, BSN;Hillary Troutman BSN, RN    Virtual Visit No    Fall or balance concerns reported    Yes    Comments Patient reports impaired balance and ambulates with a walker.    Tobacco Cessation No Change    Warm-up and Cool-down Performed as group-led instruction    Resistance Training Performed Yes    VAD Patient? No    PAD/SET Patient? No      Pain Assessment   Currently in Pain? No/denies    Pain Score 0-No pain    Multiple Pain Sites No             Capillary Blood Glucose: No results found for this or any previous visit (from the past 24 hour(s)).    Social History   Tobacco Use  Smoking Status Former   Types: Cigarettes   Quit date: 1971   Years since quitting: 53.2  Smokeless Tobacco Never  Tobacco Comments   smoked when he was a teenager    Goals Met:  Proper associated with RPD/PD & O2 Sat Independence with exercise equipment Using PLB without cueing & demonstrates good technique Exercise tolerated well Queuing for purse lip breathing No report of concerns or symptoms today Strength training completed today  Goals Unmet:  Not Applicable  Comments: Checkout at 1600.   Dr. Kathie Dike is Medical Director for [facility].

## 2023-02-15 ENCOUNTER — Ambulatory Visit (HOSPITAL_COMMUNITY)
Admission: RE | Admit: 2023-02-15 | Discharge: 2023-02-15 | Disposition: A | Discharge status: Home / Self Care | Payer: Medicare (Managed Care) | Source: Ambulatory Visit | Attending: Cardiology | Admitting: Cardiology

## 2023-02-15 DIAGNOSIS — I50810 Right heart failure, unspecified: Secondary | ICD-10-CM | POA: Diagnosis not present

## 2023-02-15 LAB — BASIC METABOLIC PANEL
Anion gap: 9 (ref 5–15)
BUN: 37 mg/dL — ABNORMAL HIGH (ref 8–23)
CO2: 28 mmol/L (ref 22–32)
Calcium: 9.8 mg/dL (ref 8.9–10.3)
Chloride: 100 mmol/L (ref 98–111)
Creatinine, Ser: 2.47 mg/dL — ABNORMAL HIGH (ref 0.61–1.24)
GFR, Estimated: 27 mL/min — ABNORMAL LOW (ref >60)
Glucose, Bld: 138 mg/dL — ABNORMAL HIGH (ref 70–99)
Potassium: 5.4 mmol/L — ABNORMAL HIGH (ref 3.5–5.1)
Sodium: 137 mmol/L (ref 135–145)

## 2023-02-16 ENCOUNTER — Encounter (HOSPITAL_COMMUNITY): Payer: Medicare (Managed Care)

## 2023-02-16 ENCOUNTER — Other Ambulatory Visit (HOSPITAL_COMMUNITY): Payer: Self-pay

## 2023-02-16 ENCOUNTER — Telehealth (HOSPITAL_COMMUNITY): Payer: Self-pay

## 2023-02-16 DIAGNOSIS — I50810 Right heart failure, unspecified: Secondary | ICD-10-CM

## 2023-02-16 NOTE — Telephone Encounter (Signed)
Patient aware of labs and medication changes. Labs scheduled.

## 2023-02-18 ENCOUNTER — Encounter (HOSPITAL_COMMUNITY)
Admission: RE | Admit: 2023-02-18 | Discharge: 2023-02-18 | Disposition: A | Discharge status: Home / Self Care | Payer: Medicare (Managed Care) | Source: Ambulatory Visit | Attending: Cardiology | Admitting: Cardiology

## 2023-02-18 DIAGNOSIS — J9611 Chronic respiratory failure with hypoxia: Secondary | ICD-10-CM

## 2023-02-18 DIAGNOSIS — I272 Pulmonary hypertension, unspecified: Secondary | ICD-10-CM

## 2023-02-18 NOTE — Progress Notes (Signed)
Daily Session Note  Patient Details  Name: Colton Jackson. MRN: [id number] Date of Birth: 11-02-1951 Referring Provider:   Flowsheet Row PULMONARY REHAB OTHER RESPIRATORY from 11/26/2022 in [place]  Referring Provider Dr. Aundra Dubin       Encounter Date: 02/18/2023  Check In:  Session Check In - 02/18/23 1453       Check-In   Supervising physician immediately available to respond to emergencies [facility] MD immediately available    Physician(s) Dr. Dellia Cloud    Staff Present Leana Roe, BS, Exercise Physiologist;Carmisha Larusso BSN, RN;Daphyne Hassell Done, RN, BSN    Virtual Visit No    Medication changes reported     Yes    Comments Pt stated he was told to stop his Lasix and Spironolactone for 2 days (yesterday and today).  Once he starts back he will only take a half tablet of Spironolactone.  (02/18/23)    Fall or balance concerns reported    Yes    Comments Patient reports impaired balance and ambulates with a walker.    Tobacco Cessation No Change    Warm-up and Cool-down Performed as group-led instruction    Resistance Training Performed Yes    VAD Patient? No    PAD/SET Patient? No      Pain Assessment   Currently in Pain? No/denies    Pain Score 0-No pain    Multiple Pain Sites No             Capillary Blood Glucose: No results found for this or any previous visit (from the past 24 hour(s)).    Social History   Tobacco Use  Smoking Status Former   Types: Cigarettes   Quit date: 1971   Years since quitting: 53.2  Smokeless Tobacco Never  Tobacco Comments   smoked when he was a teenager    Goals Met:  Proper associated with RPD/PD & O2 Sat Independence with exercise equipment Using PLB without cueing & demonstrates good technique Exercise tolerated well Queuing for purse lip breathing No report of concerns or symptoms today Strength training completed today  Goals Unmet:  Not Applicable  Comments: check out at 16:00   Dr.  Kathie Dike is Medical Director for [facility].

## 2023-02-22 ENCOUNTER — Other Ambulatory Visit (HOSPITAL_COMMUNITY): Payer: Self-pay

## 2023-02-22 ENCOUNTER — Ambulatory Visit (HOSPITAL_COMMUNITY)
Admission: RE | Admit: 2023-02-22 | Discharge: 2023-02-22 | Disposition: A | Discharge status: Home / Self Care | Payer: Medicare (Managed Care) | Source: Ambulatory Visit | Attending: Cardiology | Admitting: Cardiology

## 2023-02-22 DIAGNOSIS — I50810 Right heart failure, unspecified: Secondary | ICD-10-CM | POA: Diagnosis not present

## 2023-02-22 DIAGNOSIS — I272 Pulmonary hypertension, unspecified: Secondary | ICD-10-CM

## 2023-02-22 LAB — BASIC METABOLIC PANEL
Anion gap: 10 (ref 5–15)
BUN: 35 mg/dL — ABNORMAL HIGH (ref 8–23)
CO2: 27 mmol/L (ref 22–32)
Calcium: 10 mg/dL (ref 8.9–10.3)
Chloride: 101 mmol/L (ref 98–111)
Creatinine, Ser: 2.12 mg/dL — ABNORMAL HIGH (ref 0.61–1.24)
GFR, Estimated: 33 mL/min — ABNORMAL LOW (ref >60)
Glucose, Bld: 133 mg/dL — ABNORMAL HIGH (ref 70–99)
Potassium: 5.8 mmol/L — ABNORMAL HIGH (ref 3.5–5.1)
Sodium: 138 mmol/L (ref 135–145)

## 2023-02-22 MED ORDER — LOKELMA 10 G PO PACK: 10.0000 g | PACK | Freq: Once | ORAL | 0 refills | Status: AC

## 2023-02-23 ENCOUNTER — Encounter (HOSPITAL_COMMUNITY)
Admission: RE | Admit: 2023-02-23 | Discharge: 2023-02-23 | Disposition: A | Discharge status: Home / Self Care | Payer: Medicare (Managed Care) | Source: Ambulatory Visit | Attending: Cardiology | Admitting: Cardiology

## 2023-02-23 VITALS — Wt 287.3 lb

## 2023-02-23 DIAGNOSIS — I272 Pulmonary hypertension, unspecified: Secondary | ICD-10-CM | POA: Diagnosis not present

## 2023-02-23 DIAGNOSIS — J9611 Chronic respiratory failure with hypoxia: Secondary | ICD-10-CM

## 2023-02-23 LAB — GLUCOSE, CAPILLARY: Glucose-Capillary: 118 mg/dL — ABNORMAL HIGH (ref 70–99)

## 2023-02-23 NOTE — Progress Notes (Signed)
Daily Session Note  Patient Details  Name: Colton Jackson. MRN: [id number] Date of Birth: 1951/08/18 Referring Provider:   Flowsheet Row PULMONARY REHAB OTHER RESPIRATORY from 11/26/2022 in [facility] C[facility] RE[facility]  Referring Provider Dr. Shirlee Latch       Encounter Date: 02/23/2023  Check In:  Session Check In - 02/23/23 1459       Check-In   Supervising physician immediately available to respond to emergencies [facility] MD immediately available    Physician(s) Dr Wyline Mood    Location [facility]    Staff Present Ross Ludwig, BS, Exercise Physiologist;Wyona Neils Daphine Deutscher, RN, BSN;Dalton Debbe Mounts, MS, ACSM-CEP    Virtual Visit No    Fall or balance concerns reported    Yes    Comments Patient reports impaired balance and ambulates with a walker.    Tobacco Cessation No Change    Warm-up and Cool-down Performed as group-led instruction    Resistance Training Performed Yes    VAD Patient? No      Pain Assessment   Currently in Pain? No/denies    Pain Score 0-No pain    Multiple Pain Sites No             Capillary Blood Glucose: Results for orders placed or performed during the hospital encounter of 02/23/23 (from the past 24 hour(s))  Glucose, capillary     Status: Abnormal   Collection Time: 02/23/23  2:47 PM  Result Value Ref Range   Glucose-Capillary 118 (H) 70 - 99 mg/dL      Social History   Tobacco Use  Smoking Status Former   Types: Cigarettes   Quit date: 1971   Years since quitting: 53.3  Smokeless Tobacco Never  Tobacco Comments   smoked when he was a teenager    Goals Met:  Proper associated with RPD/PD & O2 Sat Independence with exercise equipment Using PLB without cueing & demonstrates good technique Exercise tolerated well Queuing for purse lip breathing No report of concerns or symptoms today Strength training completed today  Goals Unmet:  Not Applicable  Comments: Checkout at 1600.   Dr. Erick Blinks is  Medical Director for [facility].

## 2023-02-24 ENCOUNTER — Emergency Department (HOSPITAL_COMMUNITY): Payer: Medicare (Managed Care)

## 2023-02-24 ENCOUNTER — Encounter (HOSPITAL_COMMUNITY): Payer: Self-pay

## 2023-02-24 ENCOUNTER — Emergency Department (HOSPITAL_COMMUNITY)
Admission: EM | Admit: 2023-02-24 | Discharge: 2023-02-24 | Disposition: A | Discharge status: Home / Self Care | Payer: Medicare (Managed Care) | Attending: Emergency Medicine | Admitting: Emergency Medicine

## 2023-02-24 ENCOUNTER — Other Ambulatory Visit: Payer: Self-pay

## 2023-02-24 ENCOUNTER — Ambulatory Visit (HOSPITAL_COMMUNITY)
Admission: RE | Admit: 2023-02-24 | Discharge: 2023-02-24 | Disposition: A | Discharge status: Home / Self Care | Payer: Medicare (Managed Care) | Source: Ambulatory Visit | Attending: Internal Medicine | Admitting: Internal Medicine

## 2023-02-24 DIAGNOSIS — E119 Type 2 diabetes mellitus without complications: Secondary | ICD-10-CM | POA: Diagnosis not present

## 2023-02-24 DIAGNOSIS — Z9981 Dependence on supplemental oxygen: Secondary | ICD-10-CM | POA: Insufficient documentation

## 2023-02-24 DIAGNOSIS — I272 Pulmonary hypertension, unspecified: Secondary | ICD-10-CM | POA: Insufficient documentation

## 2023-02-24 DIAGNOSIS — Z79899 Other long term (current) drug therapy: Secondary | ICD-10-CM | POA: Diagnosis not present

## 2023-02-24 DIAGNOSIS — R899 Unspecified abnormal finding in specimens from other organs, systems and tissues: Secondary | ICD-10-CM

## 2023-02-24 DIAGNOSIS — R7989 Other specified abnormal findings of blood chemistry: Secondary | ICD-10-CM | POA: Diagnosis not present

## 2023-02-24 DIAGNOSIS — I10 Essential (primary) hypertension: Secondary | ICD-10-CM | POA: Diagnosis not present

## 2023-02-24 DIAGNOSIS — J9611 Chronic respiratory failure with hypoxia: Secondary | ICD-10-CM | POA: Diagnosis not present

## 2023-02-24 LAB — BASIC METABOLIC PANEL
Anion gap: 13 (ref 5–15)
Anion gap: 11 (ref 5–15)
BUN: 30 mg/dL — ABNORMAL HIGH (ref 8–23)
BUN: 29 mg/dL — ABNORMAL HIGH (ref 8–23)
CO2: 28 mmol/L (ref 22–32)
CO2: 26 mmol/L (ref 22–32)
Calcium: 10.1 mg/dL (ref 8.9–10.3)
Calcium: 9.8 mg/dL (ref 8.9–10.3)
Chloride: 98 mmol/L (ref 98–111)
Chloride: 101 mmol/L (ref 98–111)
Creatinine, Ser: 2.2 mg/dL — ABNORMAL HIGH (ref 0.61–1.24)
Creatinine, Ser: 2.07 mg/dL — ABNORMAL HIGH (ref 0.61–1.24)
GFR, Estimated: 31 mL/min — ABNORMAL LOW (ref >60)
GFR, Estimated: 34 mL/min — ABNORMAL LOW (ref >60)
Glucose, Bld: 185 mg/dL — ABNORMAL HIGH (ref 70–99)
Glucose, Bld: 78 mg/dL (ref 70–99)
Potassium: 5.8 mmol/L — ABNORMAL HIGH (ref 3.5–5.1)
Potassium: 4.9 mmol/L (ref 3.5–5.1)
Sodium: 139 mmol/L (ref 135–145)
Sodium: 138 mmol/L (ref 135–145)

## 2023-02-24 LAB — CBG MONITORING, ED: Glucose-Capillary: 76 mg/dL (ref 70–99)

## 2023-02-24 NOTE — ED Provider Notes (Signed)
[name]   CSN: [id number] Arrival date & time: 02/24/23  1750     History  Chief Complaint  Patient presents with   Abnormal Lab    Colton Jackson. is a 72 y.o. male past medical history of chronic hypoxic respiratory failure, allergic rhinitis, OSA, morbid obesity, diabetes, hypertension, pulmonary hypertension presents today for evaluation of hypokalemia.  Patient got blood yesterday which showed hyperkalemia.  He was given Lokelma in the office.  Patient is on chronic 5 L of oxygen through nasal cannula.  He complains of chronic low back pain and an episode of heartburn this afternoon that is resolved with OTC pepcid.  HPI    Past Medical History:  Diagnosis Date   Allergic rhinitis    Aneurysm artery, neck    Dr. Maple Hudson pulmonologist   Degenerative disk disease    Essential hypertension    GERD (gastroesophageal reflux disease)    H/O cardiovascular stress test 02/02/11   EF 59% - Normal stress test   Hearing deficit, right    Knee pain, bilateral    Lower back pain    Morbid obesity    OSA (obstructive sleep apnea)    On CPAP   Thyroid nodule    Type 2 diabetes mellitus    Type 2 diabetes mellitus without complication 06/26/2008   Qualifier: Diagnosis of  By: Maple Hudson MD, Clinton D    Past Surgical History:  Procedure Laterality Date   APPENDECTOMY     CARDIAC CATHETERIZATION     Cataract surgery Bilateral    COLONOSCOPY WITH PROPOFOL N/A 08/01/2019   Procedure: COLONOSCOPY WITH PROPOFOL;  Surgeon: Franky Macho, MD;  [facility];  Service: General;  Laterality: N/A;   KNEE ARTHROSCOPY Bilateral    NASAL SEPTOPLASTY W/ TURBINOPLASTY     RIGHT HEART CATH N/A 07/13/2022   Procedure: RIGHT HEART CATH;  Surgeon: Laurey Morale, MD;  Location: [facility] INVASIVE CV LAB;  Service: Cardiovascular;  Laterality: N/A;   RIGHT/LEFT HEART CATH AND CORONARY ANGIOGRAPHY N/A 06/23/2018   Procedure: RIGHT/LEFT  HEART CATH AND CORONARY ANGIOGRAPHY;  Surgeon: Laurey Morale, MD;  Location: [facility] INVASIVE CV LAB;  Service: Cardiovascular;  Laterality: N/A;     Home Medications Prior to Admission medications   Medication Sig Start Date End Date Taking? Authorizing Provider  acetaminophen (TYLENOL) 500 MG tablet Take 500 mg by mouth 3 (three) times daily as needed (pain.).    [provider]  azelastine (ASTELIN) 0.1 % nasal spray 1 puff each nostril twice daily as needed 08/10/22   Jetty Duhamel D, MD  Cholecalciferol (VITAMIN D3) 10 MCG (400 UNIT) CAPS Take 1 capsule by mouth daily.    [provider]  cromolyn (NASALCROM) 5.2 MG/ACT nasal spray Place 1 spray into both nostrils 3 (three) times daily as needed for allergies.     [provider]  dapagliflozin propanediol (FARXIGA) 10 MG TABS tablet Take 1 tablet (10 mg total) by mouth daily before breakfast. 01/29/23   Milford, Anderson Malta, FNP  famotidine (PEPCID) 20 MG tablet Take 20 mg by mouth 2 (two) times daily.     [provider]  furosemide (LASIX) 20 MG tablet Take 1 tablet (20 mg total) by mouth daily. 02/02/23   Milford, Anderson Malta, FNP  glipiZIDE (GLUCOTROL) 10 MG tablet Take 10 mg by mouth 2 (two) times daily.  06/26/19   [provider]  HYDROcodone-acetaminophen (NORCO) 10-325 MG tablet Take  1 tablet by mouth 3 (three) times daily as needed for moderate pain.     [provider]  Boris LownKrill Oil 500 MG CAPS Take 500 mg by mouth daily.     [provider]  latanoprost (XALATAN) 0.005 % ophthalmic solution INSTILL 1 DROP IN BOTH EYES EVERY DAY AT BEDTIME 06/11/22   Rankin, Alford HighlandGary A, MD  LEVEMIR FLEXPEN 100 UNIT/ML FlexPen Inject 25-50 Units into the skin daily as needed (blood sugar greater than 130.). 06/10/22   [provider]  losartan (COZAAR) 50 MG tablet Take 0.5 tablets (25 mg total) by mouth daily. 10/29/22   Laurey MoraleMcLean, Dalton S, MD  meclizine (ANTIVERT) 25 MG tablet Take 25 mg by  mouth 3 (three) times daily as needed for dizziness or nausea (when taking hydrocodone/apap).     [provider]  meloxicam (MOBIC) 7.5 MG tablet Take 7.5 mg by mouth 2 (two) times daily. 05/22/19   [provider]  metFORMIN (GLUCOPHAGE-XR) 500 MG 24 hr tablet Take 1,000 mg by mouth 2 (two) times daily. 12/19/18   [provider]  metoprolol succinate (TOPROL-XL) 50 MG 24 hr tablet Take 1 tablet (50 mg total) by mouth daily. 01/19/19   Shon HaleEmokpae, Courage, MD  OXYGEN Inhale 5 L/hr into the lungs daily. Pt on 6 liters at home    [provider]  psyllium (METAMUCIL) 58.6 % powder Take 1 packet by mouth 2 (two) times daily.     [provider]  spironolactone (ALDACTONE) 25 MG tablet Take 1 tablet (25 mg total) by mouth daily. 08/04/22   Milford, Anderson MaltaJessica M, FNP  trospium (SANCTURA) 20 MG tablet Take 1 tablet (20 mg total) by mouth 2 (two) times daily. 03/11/22   Bjorn PippinWrenn, John, MD      Allergies    Covid-19 mrna vaccine Fransisca Kaufmann(pfizer) Ashley Murrain[covid-19 mrna vacc (moderna)] and Sulfa antibiotics    Review of Systems   Review of Systems  Physical Exam Updated Vital Signs BP 107/86 (BP Location: Right Arm)   Pulse 78   Temp 98.3 F (36.8 C)   Resp 20   Ht 5\' 8"  (1.727 m)   Wt 127.9 kg   SpO2 96%   BMI 42.88 kg/m  Physical Exam Vitals and nursing note reviewed.  HENT:     Head: Normocephalic and atraumatic.     Mouth/Throat:     Mouth: Mucous membranes are moist.  Eyes:     General: No scleral icterus. Cardiovascular:     Rate and Rhythm: Normal rate and regular rhythm.     Pulses: Normal pulses.     Heart sounds: Normal heart sounds.  Pulmonary:     Effort: Pulmonary effort is normal.     Breath sounds: Normal breath sounds.     Comments: On 5L O2 [place] at baseline. Abdominal:     General: Abdomen is flat.     Palpations: Abdomen is soft.     Tenderness: There is no abdominal tenderness.  Musculoskeletal:        General: No deformity.  Skin:    General:  Skin is warm.     Findings: No rash.  Neurological:     General: No focal deficit present.     Mental Status: He is alert.  Psychiatric:        Mood and Affect: Mood normal.     ED Results / Procedures / Treatments   Labs (all labs ordered are listed, but only abnormal results are displayed) Labs Reviewed  BASIC METABOLIC  PANEL - Abnormal; Notable for the following components:      Result Value   BUN 29 (*)    Creatinine, Ser 2.07 (*)    GFR, Estimated 34 (*)    All other components within normal limits  CBC WITH DIFFERENTIAL/PLATELET  CBC WITH DIFFERENTIAL/PLATELET  CBG MONITORING, ED    EKG None  Radiology No results found.  Procedures Procedures    Medications Ordered in ED Medications - No data to display  ED Course/ Medical Decision Making/ A&P                             Medical Decision Making Amount and/or Complexity of Data Reviewed Radiology: ordered.   This patient presents to the ED for hyperkalemia, this involves an extensive number of treatment options, and is a complaint that carries with a high risk of complications and morbidity.  The differential diagnosis includes electrolyte abnormality,.  This is not an exhaustive list.  Lab tests: I ordered and personally interpreted labs.  The pertinent results include: WBC unremarkable. Hbg unremarkable. Platelets unremarkable. Electrolytes unremarkable. BUN, creatinine at baseline.  Problem list/ ED course/ Critical interventions/ Medical management: HPI: See above Vital signs within normal range and stable throughout visit. Laboratory/imaging studies significant for: See above. On physical examination, patient is afebrile and appears in no acute distress. Patient found to have asymptomatic hyperkalemia with no ecg changes likely secondary to CKD vs drug induced. Patient are currently taking losartan this could possibly explain hyperkalemia. Unlikely secondary to crush or thermal injury. Given CBC and  BMP results doubt DKA or tumor lysis syndrome. Patient given a dose of lokelma prior to arrival to reduce potassium level. Most recent BMP showed K of 4.9. He denies any other symptoms. Advised patient to follow-up with PCP for further evaluation and management.  Strict return precaution given.  I have reviewed the patient home medicines and have made adjustments as needed.  Cardiac monitoring/EKG: The patient was maintained on a cardiac monitor.  I personally reviewed and interpreted the cardiac monitor which showed an underlying rhythm of: sinus rhythm.  Additional history obtained: External records from outside source obtained and reviewed including: Chart review including previous notes, labs, imaging.  Consultations obtained:  Disposition Continued outpatient therapy. Follow-up with PCP recommended for reevaluation of symptoms. Treatment plan discussed with patient.  Pt acknowledged understanding was agreeable to the plan. Worrisome signs and symptoms were discussed with patient, and patient acknowledged understanding to return to the ED if they noticed these signs and symptoms. Patient was stable upon discharge.   This chart was dictated using voice recognition software.  Despite best efforts to proofread,  errors can occur which can change the documentation meaning.          Final Clinical Impression(s) / ED Diagnoses Final diagnoses:  Abnormal laboratory test    Rx / DC Orders ED Discharge Orders     None         Jeanelle Malling, [place] 02/25/23 1341    Benjiman Core, MD 03/01/23 [phone]

## 2023-02-24 NOTE — Discharge Instructions (Signed)
Please take your medications as prescribed. Take tylenol/ibuprofen for pain. I recommend close follow-up with PCP for reevaluation.  Please do not hesitate to return to emergency department if worrisome signs symptoms we discussed become apparent.  

## 2023-02-24 NOTE — ED Provider Triage Note (Signed)
Emergency Medicine Provider Triage Evaluation Note  Colton Jackson. , a 72 y.o. male  was evaluated in triage.  Pt complains of hyperkalemia.  Patient was taken off medications recently, he is unsure which ones.  He has been hyperkalemic outpatient, given low, his primary but despite that he is hyperkalemic with a potassium of 5.8, sent to ED for further evaluation.Marland Kitchen  He denies any symptoms other than his chronic lower back pain  Review of Systems  Per HPI  Physical Exam  There were no vitals taken for this visit. Gen:   Awake, no distress   Resp:  Normal effort  MSK:   Moves extremities without difficulty  Other:    Medical Decision Making  Medically screening exam initiated at 6:07 PM.  Appropriate orders placed.  Colton Jackson. was informed that the remainder of the evaluation will be completed by another provider, this initial triage assessment does not replace that evaluation, and the importance of remaining in the ED until their evaluation is complete.     Theron Arista, PA-C 02/24/23 1807

## 2023-02-24 NOTE — ED Provider Notes (Incomplete)
[place] EMERGENCY DEPARTMENT AT [facility] Provider Note   CSN: [id number] Arrival date & time: 02/24/23  1750     History {Add pertinent medical, surgical, social history, OB history to HPI:1} Chief Complaint  Patient presents with  . Abnormal Lab    Colton Jackson. is a 72 y.o. male past medical history of chronic hypoxic respiratory failure, allergic rhinitis, OSA, morbid obesity, diabetes, hypertension, pulmonary hypertension presents today for evaluation of hypokalemia.  Patient got blood yesterday which showed hyperkalemia.  He was given Lokelma in the office.  Patient is on chronic 5 L of oxygen through nasal cannula.  He complains of chronic low back pain and an episode of heartburn this afternoon that is resolved with OTC pepcid.  HPI    Past Medical History:  Diagnosis Date  . Allergic rhinitis   . Aneurysm artery, neck    Dr. Maple Hudson pulmonologist  . Degenerative disk disease   . Essential hypertension   . GERD (gastroesophageal reflux disease)   . H/O cardiovascular stress test 02/02/11   EF 59% - Normal stress test  . Hearing deficit, right   . Knee pain, bilateral   . Lower back pain   . Morbid obesity   . OSA (obstructive sleep apnea)    On CPAP  . Thyroid nodule   . Type 2 diabetes mellitus   . Type 2 diabetes mellitus without complication 06/26/2008   Qualifier: Diagnosis of  By: Maple Hudson MD, Rennis Chris    Past Surgical History:  Procedure Laterality Date  . APPENDECTOMY    . CARDIAC CATHETERIZATION    . Cataract surgery Bilateral   . COLONOSCOPY WITH PROPOFOL N/A 08/01/2019   Procedure: COLONOSCOPY WITH PROPOFOL;  Surgeon: Franky Macho, MD;  [facility];  Service: General;  Laterality: N/A;  . KNEE ARTHROSCOPY Bilateral   . NASAL SEPTOPLASTY W/ TURBINOPLASTY    . RIGHT HEART CATH N/A 07/13/2022   Procedure: RIGHT HEART CATH;  Surgeon: Laurey Morale, MD;  Location: [facility] INVASIVE CV LAB;  Service: Cardiovascular;  Laterality: N/A;   . RIGHT/LEFT HEART CATH AND CORONARY ANGIOGRAPHY N/A 06/23/2018   Procedure: RIGHT/LEFT HEART CATH AND CORONARY ANGIOGRAPHY;  Surgeon: Laurey Morale, MD;  Location: [facility] INVASIVE CV LAB;  Service: Cardiovascular;  Laterality: N/A;     Home Medications Prior to Admission medications   Medication Sig Start Date End Date Taking? Authorizing Provider  acetaminophen (TYLENOL) 500 MG tablet Take 500 mg by mouth 3 (three) times daily as needed (pain.).    [provider]  azelastine (ASTELIN) 0.1 % nasal spray 1 puff each nostril twice daily as needed 08/10/22   Jetty Duhamel D, MD  Cholecalciferol (VITAMIN D3) 10 MCG (400 UNIT) CAPS Take 1 capsule by mouth daily.    [provider]  cromolyn (NASALCROM) 5.2 MG/ACT nasal spray Place 1 spray into both nostrils 3 (three) times daily as needed for allergies.     [provider]  dapagliflozin propanediol (FARXIGA) 10 MG TABS tablet Take 1 tablet (10 mg total) by mouth daily before breakfast. 01/29/23   Milford, Anderson Malta, FNP  famotidine (PEPCID) 20 MG tablet Take 20 mg by mouth 2 (two) times daily.     [provider]  furosemide (LASIX) 20 MG tablet Take 1 tablet (20 mg total) by mouth daily. 02/02/23   Milford, Anderson Malta, FNP  glipiZIDE (GLUCOTROL) 10 MG tablet Take 10 mg by mouth 2 (two) times daily.  06/26/19   [provider]  HYDROcodone-acetaminophen (NORCO) 10-325 MG tablet Take 1 tablet by mouth 3 (three) times daily as needed for moderate pain.     [provider]  Boris Lown Oil 500 MG CAPS Take 500 mg by mouth daily.     [provider]  latanoprost (XALATAN) 0.005 % ophthalmic solution INSTILL 1 DROP IN BOTH EYES EVERY DAY AT BEDTIME 06/11/22   Rankin, Alford Highland, MD  LEVEMIR FLEXPEN 100 UNIT/ML FlexPen Inject 25-50 Units into the skin daily as needed (blood sugar greater than 130.). 06/10/22   [provider]  losartan (COZAAR) 50 MG tablet Take 0.5 tablets (25 mg total) by mouth  daily. 10/29/22   Laurey Morale, MD  meclizine (ANTIVERT) 25 MG tablet Take 25 mg by mouth 3 (three) times daily as needed for dizziness or nausea (when taking hydrocodone/apap).     [provider]  meloxicam (MOBIC) 7.5 MG tablet Take 7.5 mg by mouth 2 (two) times daily. 05/22/19   [provider]  metFORMIN (GLUCOPHAGE-XR) 500 MG 24 hr tablet Take 1,000 mg by mouth 2 (two) times daily. 12/19/18   [provider]  metoprolol succinate (TOPROL-XL) 50 MG 24 hr tablet Take 1 tablet (50 mg total) by mouth daily. 01/19/19   Shon Hale, MD  OXYGEN Inhale 5 L/hr into the lungs daily. Pt on 6 liters at home    [provider]  psyllium (METAMUCIL) 58.6 % powder Take 1 packet by mouth 2 (two) times daily.     [provider]  spironolactone (ALDACTONE) 25 MG tablet Take 1 tablet (25 mg total) by mouth daily. 08/04/22   Milford, Anderson Malta, FNP  trospium (SANCTURA) 20 MG tablet Take 1 tablet (20 mg total) by mouth 2 (two) times daily. 03/11/22   Bjorn Pippin, MD      Allergies    Covid-19 mrna vaccine Fransisca Kaufmann) Ashley Murrain mrna vacc (moderna)] and Sulfa antibiotics    Review of Systems   Review of Systems  Physical Exam Updated Vital Signs BP 107/86 (BP Location: Right Arm)   Pulse 78   Temp 98.3 F (36.8 C)   Resp 20   Ht 5\' 8"  (1.727 m)   Wt 127.9 kg   SpO2 96%   BMI 42.88 kg/m  Physical Exam  ED Results / Procedures / Treatments   Labs (all labs ordered are listed, but only abnormal results are displayed) Labs Reviewed  BASIC METABOLIC PANEL - Abnormal; Notable for the following components:      Result Value   BUN 29 (*)    Creatinine, Ser 2.07 (*)    GFR, Estimated 34 (*)    All other components within normal limits  CBC WITH DIFFERENTIAL/PLATELET  CBC WITH DIFFERENTIAL/PLATELET  CBG MONITORING, ED    EKG None  Radiology No results found.  Procedures Procedures  {Document cardiac monitor, telemetry assessment procedure when  appropriate:1}  Medications Ordered in ED Medications - No data to display  ED Course/ Medical Decision Making/ A&P   {   Click here for ABCD2, HEART and other calculatorsREFRESH Note before signing :1}                          Medical Decision Making Amount and/or Complexity of Data Reviewed Radiology: ordered.   ***  {Document critical care time when appropriate:1} {Document review of labs and clinical decision tools ie heart score, Chads2Vasc2 etc:1}  {Document your independent review of radiology images, and any outside  records:1} {Document your discussion with family members, caretakers, and with consultants:1} {Document social determinants of health affecting pt's care:1} {Document your decision making why or why not admission, treatments were needed:1} Final Clinical Impression(s) / ED Diagnoses Final diagnoses:  None    Rx / DC Orders ED Discharge Orders     None

## 2023-02-24 NOTE — ED Triage Notes (Signed)
Pt came in via POV d/t PCP recommendation since his labs were sowing an increased K+ level after receiving Lokelma in their office. A/Ox4, endorses 3/10 chronic hip pain, & some lower back pain.

## 2023-02-25 ENCOUNTER — Encounter (HOSPITAL_COMMUNITY): Payer: Medicare (Managed Care)

## 2023-03-02 ENCOUNTER — Encounter (HOSPITAL_COMMUNITY): Payer: Medicare (Managed Care)

## 2023-03-04 ENCOUNTER — Encounter (HOSPITAL_COMMUNITY): Payer: Medicare (Managed Care)

## 2023-03-09 ENCOUNTER — Encounter (HOSPITAL_COMMUNITY): Payer: Medicare (Managed Care)

## 2023-03-11 ENCOUNTER — Encounter (HOSPITAL_COMMUNITY): Payer: Medicare (Managed Care)

## 2023-03-11 ENCOUNTER — Telehealth (HOSPITAL_COMMUNITY): Payer: Self-pay | Admitting: Emergency Medicine

## 2023-03-11 NOTE — Progress Notes (Signed)
Pulmonary Individual Treatment Plan  Patient Details  Name: Colton Jackson. MRN: [id number] Date of Birth: 1950/11/30 Referring Provider:   Flowsheet Row PULMONARY REHAB OTHER RESPIRATORY from 11/26/2022 in [facility] CARDIAC REH[facility]IL[facility]ATION  Referring Provider Dr. Shirlee Latch       Initial Encounter Date:  Flowsheet Row PULMONARY REHAB OTHER RESPIRATORY from 11/26/2022 in [facility]  Date 11/26/22       Visit Diagnosis: Pulmonary HTN  Chronic respiratory failure with hypoxia  Pulmonary hypertension  Patient's Home Medications on Admission:   Current Outpatient Medications:    acetaminophen (TYLENOL) 500 MG tablet, Take 500 mg by mouth 3 (three) times daily as needed (pain.)., Disp: , Rfl:    azelastine (ASTELIN) 0.1 % nasal spray, 1 puff each nostril twice daily as needed, Disp: 30 mL, Rfl: 12   Cholecalciferol (VITAMIN D3) 10 MCG (400 UNIT) CAPS, Take 1 capsule by mouth daily., Disp: , Rfl:    cromolyn (NASALCROM) 5.2 MG/ACT nasal spray, Place 1 spray into both nostrils 3 (three) times daily as needed for allergies. , Disp: , Rfl:    dapagliflozin propanediol (FARXIGA) 10 MG TABS tablet, Take 1 tablet (10 mg total) by mouth daily before breakfast., Disp: 30 tablet, Rfl: 8   famotidine (PEPCID) 20 MG tablet, Take 20 mg by mouth 2 (two) times daily. , Disp: , Rfl:    furosemide (LASIX) 20 MG tablet, Take 1 tablet (20 mg total) by mouth daily., Disp: 30 tablet, Rfl: 11   glipiZIDE (GLUCOTROL) 10 MG tablet, Take 10 mg by mouth 2 (two) times daily. , Disp: , Rfl:    HYDROcodone-acetaminophen (NORCO) 10-325 MG tablet, Take 1 tablet by mouth 3 (three) times daily as needed for moderate pain. , Disp: , Rfl:    Krill Oil 500 MG CAPS, Take 500 mg by mouth daily. , Disp: , Rfl:    latanoprost (XALATAN) 0.005 % ophthalmic solution, INSTILL 1 DROP IN BOTH EYES EVERY DAY AT BEDTIME, Disp: 7.5 mL, Rfl: 12   LEVEMIR FLEXPEN 100 UNIT/ML FlexPen, Inject 25-50 Units into the skin  daily as needed (blood sugar greater than 130.)., Disp: , Rfl:    losartan (COZAAR) 50 MG tablet, Take 0.5 tablets (25 mg total) by mouth daily., Disp: 90 tablet, Rfl: 3   meclizine (ANTIVERT) 25 MG tablet, Take 25 mg by mouth 3 (three) times daily as needed for dizziness or nausea (when taking hydrocodone/apap). , Disp: , Rfl:    meloxicam (MOBIC) 7.5 MG tablet, Take 7.5 mg by mouth 2 (two) times daily., Disp: , Rfl:    metFORMIN (GLUCOPHAGE-XR) 500 MG 24 hr tablet, Take 1,000 mg by mouth 2 (two) times daily., Disp: , Rfl:    metoprolol succinate (TOPROL-XL) 50 MG 24 hr tablet, Take 1 tablet (50 mg total) by mouth daily., Disp: 30 tablet, Rfl: 3   OXYGEN, Inhale 5 L/hr into the lungs daily. Pt on 6 liters at home, Disp: , Rfl:    psyllium (METAMUCIL) 58.6 % powder, Take 1 packet by mouth 2 (two) times daily. , Disp: , Rfl:    spironolactone (ALDACTONE) 25 MG tablet, Take 1 tablet (25 mg total) by mouth daily., Disp: 90 tablet, Rfl: 3   trospium (SANCTURA) 20 MG tablet, Take 1 tablet (20 mg total) by mouth 2 (two) times daily., Disp: 180 tablet, Rfl: 3  Past Medical History: Past Medical History:  Diagnosis Date   Allergic rhinitis    Aneurysm artery, neck    Dr. Maple Hudson pulmonologist  Degenerative disk disease    Essential hypertension    GERD (gastroesophageal reflux disease)    H/O cardiovascular stress test 02/02/11   EF 59% - Normal stress test   Hearing deficit, right    Knee pain, bilateral    Lower back pain    Morbid obesity    OSA (obstructive sleep apnea)    On CPAP   Thyroid nodule    Type 2 diabetes mellitus    Type 2 diabetes mellitus without complication 06/26/2008   Qualifier: Diagnosis of  By: Maple Hudson MD, Clinton D     Tobacco Use: Social History   Tobacco Use  Smoking Status Former   Types: Cigarettes   Quit date: 1971   Years since quitting: 53.3  Smokeless Tobacco Never  Tobacco Comments   smoked when he was a teenager    Labs: Review Flowsheet  More  data exists      Latest Ref Rng & Units 06/23/2018 06/04/2022 06/05/2022 07/13/2022 08/04/2022  Labs for ITP Cardiac and Pulmonary Rehab  Cholestrol 0 - 200 mg/dL - - - - 161   LDL (calc) 0 - 99 mg/dL - - - - 62   HDL-C >09 mg/dL - - - - 27   Trlycerides <150 mg/dL - - - - 604   Hemoglobin A1c 4.8 - 5.6 % - 6.9  - - -  Bicarbonate 20.0 - 28.0 mmol/L 20.0 - 28.0 mmol/L 20.0 - 28.0 mmol/L 27.3  28.5  - 36.8  32.8  32.8  32.7  -  TCO2 22 - 32 mmol/L 22 - 32 mmol/L 22 - 32 mmol/L 29  30  - - 35  35  34  -  O2 Saturation % % % 74.0  74.0  - 27.6  71  69  68  -    Capillary Blood Glucose: Lab Results  Component Value Date   GLUCAP 76 02/24/2023   GLUCAP 118 (H) 02/23/2023   GLUCAP 75 01/14/2023   GLUCAP 156 (H) 01/05/2023   GLUCAP 90 12/29/2022    POCT Glucose     Row Name 12/16/22 0726             POCT Blood Glucose   Pre-Exercise 205 mg/dL                Pulmonary Assessment Scores:  Pulmonary Assessment Scores     Row Name 11/20/22 1447         ADL UCSD   ADL Phase Entry     SOB Score total 31     Rest 0     Walk 3     Stairs 5     Bath 0     Dress 2     Shop 3       CAT Score   CAT Score 10       mMRC Score   mMRC Score 3             UCSD: Self-administered rating of dyspnea associated with activities of daily living (ADLs) 6-point scale (0 = "not at all" to 5 = "maximal or unable to do because of breathlessness")  Scoring Scores range from 0 to 120.  Minimally important difference is 5 units  CAT: CAT can identify the health impairment of COPD patients and is better correlated with disease progression.  CAT has a scoring range of zero to 40. The CAT score is classified into four groups of low (less than 10), medium (10 - 20), high (  21-30) and very high (31-40) based on the impact level of disease on health status. A CAT score over 10 suggests significant symptoms.  A worsening CAT score could be explained by an exacerbation, poor medication  adherence, poor inhaler technique, or progression of COPD or comorbid conditions.  CAT MCID is 2 points  mMRC: mMRC (Modified Medical Research Council) Dyspnea Scale is used to assess the degree of baseline functional disability in patients of respiratory disease due to dyspnea. No minimal important difference is established. A decrease in score of 1 point or greater is considered a positive change.   Pulmonary Function Assessment:   Exercise Target Goals: Exercise Program Goal: Individual exercise prescription set using results from initial 6 min walk test and THRR while considering  patient's activity barriers and safety.   Exercise Prescription Goal: Initial exercise prescription builds to 30-45 minutes a day of aerobic activity, 2-3 days per week.  Home exercise guidelines will be given to patient during program as part of exercise prescription that the participant will acknowledge.  Activity Barriers & Risk Stratification:  Activity Barriers & Cardiac Risk Stratification - 11/20/22 1320       Activity Barriers & Cardiac Risk Stratification   Activity Barriers Arthritis;Back Problems;Shortness of Breath;Balance Concerns;Assistive Device    Cardiac Risk Stratification Moderate             6 Minute Walk:  6 Minute Walk     Row Name 11/26/22 1535         6 Minute Walk   Phase Initial     Distance 650 feet     Walk Time 6 minutes     # of Rest Breaks 1     MPH 1.23     METS 1.14     RPE 12     Perceived Dyspnea  13     VO2 Peak 4     Symptoms Yes (comment)     Comments one sitting break due to SOB and fatigue (1 minute)     Resting HR 72 bpm     Resting BP 130/70     Resting Oxygen Saturation  96 %     Exercise Oxygen Saturation  during 6 min walk 93 %     Max Ex. HR 110 bpm     Max Ex. BP 162/78     2 Minute Post BP 142/78       Interval HR   1 Minute HR 100     2 Minute HR 106     3 Minute HR 111     4 Minute HR 110     5 Minute HR 93     6 Minute HR  109     2 Minute Post HR 87     Interval Heart Rate? Yes       Interval Oxygen   Interval Oxygen? Yes     Baseline Oxygen Saturation % 96 %     1 Minute Oxygen Saturation % 94 %     1 Minute Liters of Oxygen 4 L     2 Minute Oxygen Saturation % 93 %     2 Minute Liters of Oxygen 4 L     3 Minute Oxygen Saturation % 93 %     3 Minute Liters of Oxygen 4 L     4 Minute Oxygen Saturation % 93 %     4 Minute Liters of Oxygen 4 L     5 Minute Oxygen Saturation %  94 %     5 Minute Liters of Oxygen 4 L     6 Minute Oxygen Saturation % 93 %     6 Minute Liters of Oxygen 4 L     2 Minute Post Oxygen Saturation % 98 %     2 Minute Post Liters of Oxygen 4 L              Oxygen Initial Assessment:  Oxygen Initial Assessment - 11/20/22 1446       Home Oxygen   Home Oxygen Device Home Concentrator;E-Tanks    Sleep Oxygen Prescription Continuous    Liters per minute 5    Home Resting Oxygen Prescription Continuous    Liters per minute 5    Compliance with Home Oxygen Use Yes      Intervention   Short Term Goals To learn and exhibit compliance with exercise, home and travel O2 prescription;To learn and understand importance of monitoring SPO2 with pulse oximeter and demonstrate accurate use of the pulse oximeter.;To learn and understand importance of maintaining oxygen saturations>88%;To learn and demonstrate proper pursed lip breathing techniques or other breathing techniques.     Long  Term Goals Exhibits compliance with exercise, home  and travel O2 prescription;Verbalizes importance of monitoring SPO2 with pulse oximeter and return demonstration;Maintenance of O2 saturations>88%;Exhibits proper breathing techniques, such as pursed lip breathing or other method taught during program session             Oxygen Re-Evaluation:  Oxygen Re-Evaluation     Row Name 12/16/22 0725 01/13/23 0743 02/09/23 1617         Program Oxygen Prescription   Program Oxygen Prescription  Continuous Continuous Continuous     Liters per minute 6 6 6        Home Oxygen   Home Oxygen Device Home Concentrator;E-Tanks Home Concentrator;E-Tanks Home Concentrator;E-Tanks     Sleep Oxygen Prescription Continuous Continuous Continuous     Liters per minute 5 5 5      Home Exercise Oxygen Prescription Continuous Continuous Continuous     Liters per minute 6 6 6      Home Resting Oxygen Prescription Continuous Continuous Continuous     Liters per minute 5 5 5      Compliance with Home Oxygen Use Yes Yes Yes       Goals/Expected Outcomes   Short Term Goals To learn and exhibit compliance with exercise, home and travel O2 prescription;To learn and understand importance of monitoring SPO2 with pulse oximeter and demonstrate accurate use of the pulse oximeter.;To learn and understand importance of maintaining oxygen saturations>88%;To learn and demonstrate proper pursed lip breathing techniques or other breathing techniques.  To learn and exhibit compliance with exercise, home and travel O2 prescription;To learn and understand importance of monitoring SPO2 with pulse oximeter and demonstrate accurate use of the pulse oximeter.;To learn and understand importance of maintaining oxygen saturations>88%;To learn and demonstrate proper pursed lip breathing techniques or other breathing techniques.  To learn and exhibit compliance with exercise, home and travel O2 prescription;To learn and understand importance of monitoring SPO2 with pulse oximeter and demonstrate accurate use of the pulse oximeter.;To learn and understand importance of maintaining oxygen saturations>88%;To learn and demonstrate proper pursed lip breathing techniques or other breathing techniques.      Long  Term Goals Exhibits compliance with exercise, home  and travel O2 prescription;Verbalizes importance of monitoring SPO2 with pulse oximeter and return demonstration;Maintenance of O2 saturations>88%;Exhibits proper breathing techniques,  such as pursed lip breathing  or other method taught during program session Exhibits compliance with exercise, home  and travel O2 prescription;Verbalizes importance of monitoring SPO2 with pulse oximeter and return demonstration;Maintenance of O2 saturations>88%;Exhibits proper breathing techniques, such as pursed lip breathing or other method taught during program session Exhibits compliance with exercise, home  and travel O2 prescription;Verbalizes importance of monitoring SPO2 with pulse oximeter and return demonstration;Maintenance of O2 saturations>88%;Exhibits proper breathing techniques, such as pursed lip breathing or other method taught during program session     Goals/Expected Outcomes compliance compliance compliance              Oxygen Discharge (Final Oxygen Re-Evaluation):  Oxygen Re-Evaluation - 02/09/23 1617       Program Oxygen Prescription   Program Oxygen Prescription Continuous    Liters per minute 6      Home Oxygen   Home Oxygen Device Home Concentrator;E-Tanks    Sleep Oxygen Prescription Continuous    Liters per minute 5    Home Exercise Oxygen Prescription Continuous    Liters per minute 6    Home Resting Oxygen Prescription Continuous    Liters per minute 5    Compliance with Home Oxygen Use Yes      Goals/Expected Outcomes   Short Term Goals To learn and exhibit compliance with exercise, home and travel O2 prescription;To learn and understand importance of monitoring SPO2 with pulse oximeter and demonstrate accurate use of the pulse oximeter.;To learn and understand importance of maintaining oxygen saturations>88%;To learn and demonstrate proper pursed lip breathing techniques or other breathing techniques.     Long  Term Goals Exhibits compliance with exercise, home  and travel O2 prescription;Verbalizes importance of monitoring SPO2 with pulse oximeter and return demonstration;Maintenance of O2 saturations>88%;Exhibits proper breathing techniques, such as  pursed lip breathing or other method taught during program session    Goals/Expected Outcomes compliance             Initial Exercise Prescription:  Initial Exercise Prescription - 11/26/22 1500       Date of Initial Exercise RX and Referring Provider   Date 11/26/22    Referring Provider Dr. Shirlee Latch    Expected Discharge Date 03/25/23      Oxygen   Oxygen Continuous    Liters 4    Maintain Oxygen Saturation 88% or higher      NuStep   Level 1    SPM 80    Minutes 39      Prescription Details   Frequency (times per week) 2    Duration Progress to 30 minutes of continuous aerobic without signs/symptoms of physical distress      Intensity   THRR 40-80% of Max Heartrate 60-120    Ratings of Perceived Exertion 11-13      Resistance Training   Training Prescription Yes    Weight 0    Reps 10-15             Perform Capillary Blood Glucose checks as needed.  Exercise Prescription Changes:   Exercise Prescription Changes     Row Name 12/01/22 1500 12/16/22 0700 01/12/23 1500 01/14/23 1500 02/09/23 1600     Response to Exercise   Blood Pressure (Admit) 126/70 142/74 158/78 148/70 156/84   Blood Pressure (Exercise) 148/70 142/74 160/66 160/66 150/70   Blood Pressure (Exit) 158/78 138/84 140/60 146/70 122/62   Heart Rate (Admit) 64 bpm 86 bpm 81 bpm 77 bpm 90 bpm   Heart Rate (Exercise) 88 bpm 92 bpm 83 bpm 90  bpm 88 bpm   Heart Rate (Exit) 74 bpm 85 bpm 74 bpm 80 bpm 81 bpm   Oxygen Saturation (Admit) 97 % 96 % 95 % 96 % 98 %   Oxygen Saturation (Exercise) 94 % 94 % 92 % 92 % 96 %   Oxygen Saturation (Exit) 98 % 98 % 97 % 94 % 99 %   Rating of Perceived Exertion (Exercise) 12 12 12 13 12    Perceived Dyspnea (Exercise) 12 12 12 11 12    Duration Continue with 30 min of aerobic exercise without signs/symptoms of physical distress. Continue with 30 min of aerobic exercise without signs/symptoms of physical distress. Continue with 30 min of aerobic exercise without  signs/symptoms of physical distress. Continue with 30 min of aerobic exercise without signs/symptoms of physical distress. Continue with 30 min of aerobic exercise without signs/symptoms of physical distress.   Intensity THRR unchanged THRR unchanged THRR unchanged THRR unchanged THRR unchanged     Progression   Progression Continue to progress workloads to maintain intensity without signs/symptoms of physical distress. Continue to progress workloads to maintain intensity without signs/symptoms of physical distress. Continue to progress workloads to maintain intensity without signs/symptoms of physical distress. Continue to progress workloads to maintain intensity without signs/symptoms of physical distress. Continue to progress workloads to maintain intensity without signs/symptoms of physical distress.     Resistance Training   Training Prescription Yes Yes Yes Yes Yes   Weight 0 0 0 0 0   Reps 10-15 10-15 10-15 10-15 10-15   Time 10 Minutes 10 Minutes 10 Minutes 10 Minutes 10 Minutes     Oxygen   Oxygen Continuous Continuous Continuous Continuous Continuous   Liters 6 6 6 6 6      NuStep   Level 1 2 3 3 3    SPM 81 101 90 105 104   Minutes 39 39 39 39 39   METs 2 2.2 2.1 2.4 2.4     Oxygen   Maintain Oxygen Saturation 88% or higher 88% or higher -- -- --    Row Name 02/23/23 1500             Response to Exercise   Blood Pressure (Admit) 158/76       Blood Pressure (Exercise) 162/70       Blood Pressure (Exit) 142/72       Heart Rate (Admit) 79 bpm       Heart Rate (Exercise) 89 bpm       Heart Rate (Exit) 80 bpm       Oxygen Saturation (Admit) 95 %       Oxygen Saturation (Exercise) 96 %       Oxygen Saturation (Exit) 94 %       Rating of Perceived Exertion (Exercise) 13       Perceived Dyspnea (Exercise) 13       Duration Continue with 30 min of aerobic exercise without signs/symptoms of physical distress.       Intensity THRR unchanged         Progression    Progression Continue to progress workloads to maintain intensity without signs/symptoms of physical distress.         Resistance Training   Training Prescription Yes       Weight 3       Reps 10-15       Time 10 Minutes         Oxygen   Oxygen Continuous       Liters  6         NuStep   Level 3       SPM 110       Minutes 39       METs 2.6                Exercise Comments:   Exercise Goals and Review:   Exercise Goals     Row Name 11/26/22 1540 12/16/22 0729 01/13/23 0738 02/09/23 1611 03/10/23 1455     Exercise Goals   Increase Physical Activity Yes Yes Yes Yes Yes   Intervention Provide advice, education, support and counseling about physical activity/exercise needs.;Develop an individualized exercise prescription for aerobic and resistive training based on initial evaluation findings, risk stratification, comorbidities and participant's personal goals. Provide advice, education, support and counseling about physical activity/exercise needs.;Develop an individualized exercise prescription for aerobic and resistive training based on initial evaluation findings, risk stratification, comorbidities and participant's personal goals. Provide advice, education, support and counseling about physical activity/exercise needs.;Develop an individualized exercise prescription for aerobic and resistive training based on initial evaluation findings, risk stratification, comorbidities and participant's personal goals. Provide advice, education, support and counseling about physical activity/exercise needs.;Develop an individualized exercise prescription for aerobic and resistive training based on initial evaluation findings, risk stratification, comorbidities and participant's personal goals. Provide advice, education, support and counseling about physical activity/exercise needs.;Develop an individualized exercise prescription for aerobic and resistive training based on initial evaluation  findings, risk stratification, comorbidities and participant's personal goals.   Expected Outcomes Short Term: Attend rehab on a regular basis to increase amount of physical activity.;Long Term: Add in home exercise to make exercise part of routine and to increase amount of physical activity.;Long Term: Exercising regularly at least 3-5 days a week. Short Term: Attend rehab on a regular basis to increase amount of physical activity.;Long Term: Add in home exercise to make exercise part of routine and to increase amount of physical activity.;Long Term: Exercising regularly at least 3-5 days a week. Short Term: Attend rehab on a regular basis to increase amount of physical activity.;Long Term: Add in home exercise to make exercise part of routine and to increase amount of physical activity.;Long Term: Exercising regularly at least 3-5 days a week. Short Term: Attend rehab on a regular basis to increase amount of physical activity.;Long Term: Add in home exercise to make exercise part of routine and to increase amount of physical activity.;Long Term: Exercising regularly at least 3-5 days a week. Short Term: Attend rehab on a regular basis to increase amount of physical activity.;Long Term: Add in home exercise to make exercise part of routine and to increase amount of physical activity.;Long Term: Exercising regularly at least 3-5 days a week.   Increase Strength and Stamina Yes Yes Yes Yes Yes   Intervention Provide advice, education, support and counseling about physical activity/exercise needs.;Develop an individualized exercise prescription for aerobic and resistive training based on initial evaluation findings, risk stratification, comorbidities and participant's personal goals. Provide advice, education, support and counseling about physical activity/exercise needs.;Develop an individualized exercise prescription for aerobic and resistive training based on initial evaluation findings, risk stratification,  comorbidities and participant's personal goals. Provide advice, education, support and counseling about physical activity/exercise needs.;Develop an individualized exercise prescription for aerobic and resistive training based on initial evaluation findings, risk stratification, comorbidities and participant's personal goals. Provide advice, education, support and counseling about physical activity/exercise needs.;Develop an individualized exercise prescription for aerobic and resistive training based on initial evaluation findings, risk stratification, comorbidities  and participant's personal goals. Provide advice, education, support and counseling about physical activity/exercise needs.;Develop an individualized exercise prescription for aerobic and resistive training based on initial evaluation findings, risk stratification, comorbidities and participant's personal goals.   Expected Outcomes Short Term: Increase workloads from initial exercise prescription for resistance, speed, and METs.;Short Term: Perform resistance training exercises routinely during rehab and add in resistance training at home;Long Term: Improve cardiorespiratory fitness, muscular endurance and strength as measured by increased METs and functional capacity ( ) Short Term: Increase workloads from initial exercise prescription for resistance, speed, and METs.;Short Term: Perform resistance training exercises routinely during rehab and add in resistance training at home;Long Term: Improve cardiorespiratory fitness, muscular endurance and strength as measured by increased METs and functional capacity ( ) Short Term: Increase workloads from initial exercise prescription for resistance, speed, and METs.;Short Term: Perform resistance training exercises routinely during rehab and add in resistance training at home;Long Term: Improve cardiorespiratory fitness, muscular endurance and strength as measured by increased METs and functional  capacity ( ) Short Term: Increase workloads from initial exercise prescription for resistance, speed, and METs.;Short Term: Perform resistance training exercises routinely during rehab and add in resistance training at home;Long Term: Improve cardiorespiratory fitness, muscular endurance and strength as measured by increased METs and functional capacity ( ) Short Term: Increase workloads from initial exercise prescription for resistance, speed, and METs.;Short Term: Perform resistance training exercises routinely during rehab and add in resistance training at home;Long Term: Improve cardiorespiratory fitness, muscular endurance and strength as measured by increased METs and functional capacity ( )   Able to understand and use rate of perceived exertion (RPE) scale Yes Yes Yes Yes Yes   Intervention Provide education and explanation on how to use RPE scale Provide education and explanation on how to use RPE scale Provide education and explanation on how to use RPE scale Provide education and explanation on how to use RPE scale Provide education and explanation on how to use RPE scale   Expected Outcomes Short Term: Able to use RPE daily in rehab to express subjective intensity level;Long Term:  Able to use RPE to guide intensity level when exercising independently Short Term: Able to use RPE daily in rehab to express subjective intensity level;Long Term:  Able to use RPE to guide intensity level when exercising independently Short Term: Able to use RPE daily in rehab to express subjective intensity level;Long Term:  Able to use RPE to guide intensity level when exercising independently Short Term: Able to use RPE daily in rehab to express subjective intensity level;Long Term:  Able to use RPE to guide intensity level when exercising independently Short Term: Able to use RPE daily in rehab to express subjective intensity level;Long Term:  Able to use RPE to guide intensity level when exercising  independently   Able to understand and use Dyspnea scale -- -- -- Yes Yes   Intervention -- -- -- Provide education and explanation on how to use Dyspnea scale Provide education and explanation on how to use Dyspnea scale   Expected Outcomes -- -- -- Short Term: Able to use Dyspnea scale daily in rehab to express subjective sense of shortness of breath during exertion;Long Term: Able to use Dyspnea scale to guide intensity level when exercising independently Short Term: Able to use Dyspnea scale daily in rehab to express subjective sense of shortness of breath during exertion;Long Term: Able to use Dyspnea scale to guide intensity level when exercising independently   Knowledge and understanding of Target Heart Rate Range (THRR)  Yes Yes Yes Yes Yes   Intervention Provide education and explanation of THRR including how the numbers were predicted and where they are located for reference Provide education and explanation of THRR including how the numbers were predicted and where they are located for reference Provide education and explanation of THRR including how the numbers were predicted and where they are located for reference Provide education and explanation of THRR including how the numbers were predicted and where they are located for reference Provide education and explanation of THRR including how the numbers were predicted and where they are located for reference   Expected Outcomes Short Term: Able to state/look up THRR;Short Term: Able to use daily as guideline for intensity in rehab;Long Term: Able to use THRR to govern intensity when exercising independently Short Term: Able to state/look up THRR;Short Term: Able to use daily as guideline for intensity in rehab;Long Term: Able to use THRR to govern intensity when exercising independently Short Term: Able to state/look up THRR;Short Term: Able to use daily as guideline for intensity in rehab;Long Term: Able to use THRR to govern intensity when  exercising independently Short Term: Able to state/look up THRR;Short Term: Able to use daily as guideline for intensity in rehab;Long Term: Able to use THRR to govern intensity when exercising independently Short Term: Able to state/look up THRR;Short Term: Able to use daily as guideline for intensity in rehab;Long Term: Able to use THRR to govern intensity when exercising independently   Able to check pulse independently Yes Yes Yes -- Yes   Intervention Provide education and demonstration on how to check pulse in carotid and radial arteries.;Review the importance of being able to check your own pulse for safety during independent exercise Provide education and demonstration on how to check pulse in carotid and radial arteries.;Review the importance of being able to check your own pulse for safety during independent exercise Provide education and demonstration on how to check pulse in carotid and radial arteries.;Review the importance of being able to check your own pulse for safety during independent exercise -- Provide education and demonstration on how to check pulse in carotid and radial arteries.;Review the importance of being able to check your own pulse for safety during independent exercise   Expected Outcomes Short Term: Able to explain why pulse checking is important during independent exercise;Long Term: Able to check pulse independently and accurately Short Term: Able to explain why pulse checking is important during independent exercise;Long Term: Able to check pulse independently and accurately Short Term: Able to explain why pulse checking is important during independent exercise;Long Term: Able to check pulse independently and accurately -- Short Term: Able to explain why pulse checking is important during independent exercise;Long Term: Able to check pulse independently and accurately   Understanding of Exercise Prescription Yes Yes Yes Yes Yes   Intervention Provide education, explanation,  and written materials on patient's individual exercise prescription Provide education, explanation, and written materials on patient's individual exercise prescription Provide education, explanation, and written materials on patient's individual exercise prescription Provide education, explanation, and written materials on patient's individual exercise prescription Provide education, explanation, and written materials on patient's individual exercise prescription   Expected Outcomes Short Term: Able to explain program exercise prescription;Long Term: Able to explain home exercise prescription to exercise independently Short Term: Able to explain program exercise prescription;Long Term: Able to explain home exercise prescription to exercise independently Short Term: Able to explain program exercise prescription;Long Term: Able to explain home exercise prescription to  exercise independently Short Term: Able to explain program exercise prescription;Long Term: Able to explain home exercise prescription to exercise independently Short Term: Able to explain program exercise prescription;Long Term: Able to explain home exercise prescription to exercise independently            Exercise Goals Re-Evaluation :  Exercise Goals Re-Evaluation     Row Name 12/16/22 0729 01/13/23 0739 02/09/23 1611 03/10/23 1455       Exercise Goal Re-Evaluation   Exercise Goals Review Increase Physical Activity;Increase Strength and Stamina;Able to understand and use rate of perceived exertion (RPE) scale;Able to understand and use Dyspnea scale;Knowledge and understanding of Target Heart Rate Range (THRR);Understanding of Exercise Prescription Increase Physical Activity;Increase Strength and Stamina;Able to understand and use rate of perceived exertion (RPE) scale;Able to understand and use Dyspnea scale;Knowledge and understanding of Target Heart Rate Range (THRR);Understanding of Exercise Prescription Increase Physical  Activity;Increase Strength and Stamina;Able to understand and use rate of perceived exertion (RPE) scale;Able to understand and use Dyspnea scale;Knowledge and understanding of Target Heart Rate Range (THRR);Understanding of Exercise Prescription Increase Physical Activity;Increase Strength and Stamina;Able to understand and use rate of perceived exertion (RPE) scale;Able to understand and use Dyspnea scale;Knowledge and understanding of Target Heart Rate Range (THRR);Understanding of Exercise Prescription    Comments Pt has completed 7 sessions of PR. His blood sugar has been more consistent over the past few sessions, as he had two episodes of hypoglycemia in the early part of the program. He has been able to progress his workload, and he is motivated while he is in class. He is currently exercising at 2.2 METs on the stepper. Will continue to monitor and progress as able. Pt has completed 13 sessiosn of PR. His blood sugar continues to be consistent the past sessions and hes able to attend class compare to the first couple sessions. He is tolerating exercise well and has increased his workload. He recently complained of back pain over the weekend and he thinks it may be due to exercise but after asking more questions he was out shopping and walking around that weekend as well which would contribute to the over use. He is currently exercising at 2.1 METs on the stepper. Will continue to monitor and progress as able. Pt has completed 16 sessions of PR. He did not come to class for 2 weeks due to his blood sugar and doctor visits. His blood sugar has been within the limits for exercise since he came back last week. He is tolerating exericse well and is pushing himself to reach small goals he sets, laps per class. He is currently exercising at 2.4 METs on the stepper. Will continue to monitor and progress as able. Pt has completed 19 sessions of PR. He has not been to rehab since 4/9 and has not called. He was  exercising at 2.6 METs on the stepper. Will continuet to monitor and progress as able.    Expected Outcomes Through exercise at rehab and at home, the patient will meet their stated goals. Through exercise at rehab and at home, the patient will meet their stated goals. Through exercise at rehab and at home, the patient will meet their stated goals. Through exercise at rehab and at home, the patient will meet their stated goals.             Discharge Exercise Prescription (Final Exercise Prescription Changes):  Exercise Prescription Changes - 02/23/23 1500       Response to Exercise   Blood  Pressure (Admit) 158/76    Blood Pressure (Exercise) 162/70    Blood Pressure (Exit) 142/72    Heart Rate (Admit) 79 bpm    Heart Rate (Exercise) 89 bpm    Heart Rate (Exit) 80 bpm    Oxygen Saturation (Admit) 95 %    Oxygen Saturation (Exercise) 96 %    Oxygen Saturation (Exit) 94 %    Rating of Perceived Exertion (Exercise) 13    Perceived Dyspnea (Exercise) 13    Duration Continue with 30 min of aerobic exercise without signs/symptoms of physical distress.    Intensity THRR unchanged      Progression   Progression Continue to progress workloads to maintain intensity without signs/symptoms of physical distress.      Resistance Training   Training Prescription Yes    Weight 3    Reps 10-15    Time 10 Minutes      Oxygen   Oxygen Continuous    Liters 6      NuStep   Level 3    SPM 110    Minutes 39    METs 2.6             Nutrition:  Target Goals: Understanding of nutrition guidelines, daily intake of sodium 1500mg , cholesterol 200mg , calories 30% from fat and 7% or less from saturated fats, daily to have 5 or more servings of fruits and vegetables.  Biometrics:  Pre Biometrics - 11/26/22 1541       Pre Biometrics   Height 5\' 8"  (1.727 m)    Weight 131.7 kg    Waist Circumference 56 inches    Hip Circumference 55 inches    Waist to Hip Ratio 1.02 %    BMI  (Calculated) 44.16    Triceps Skinfold 40 mm    % Body Fat 45.6 %    Grip Strength 31.3 kg    Flexibility 0 in    Single Leg Stand 0 seconds              Nutrition Therapy Plan and Nutrition Goals:  Nutrition Therapy & Goals - 11/20/22 1431       Personal Nutrition Goals   Additional Goals? No    Comments Patient scored 43 on his diet assessment. He cooks for himself mostly. Handouts provided and explained on heathier choices and DM. We offer 2 educational sessions on heart healthy nutrition with handouts and assistance with RD referral if patient is interested.      Intervention Plan   Intervention Nutrition handout(s) given to patient.    Expected Outcomes Short Term Goal: Understand basic principles of dietary content, such as calories, fat, sodium, cholesterol and nutrients.             Nutrition Assessments:  Nutrition Assessments - 11/20/22 1430       MEDFICTS Scores   Pre Score 43            MEDIFICTS Score Key: ?70 Need to make dietary changes  40-70 Heart Healthy Diet ? 40 Therapeutic Level Cholesterol Diet   Picture Your Plate Scores: <40 Unhealthy dietary pattern with much room for improvement. 41-50 Dietary pattern unlikely to meet recommendations for good health and room for improvement. 51-60 More healthful dietary pattern, with some room for improvement.  >60 Healthy dietary pattern, although there may be some specific behaviors that could be improved.    Nutrition Goals Re-Evaluation:   Nutrition Goals Discharge (Final Nutrition Goals Re-Evaluation):   Psychosocial: Target Goals: Acknowledge presence  or absence of significant depression and/or stress, maximize coping skills, provide positive support system. Participant is able to verbalize types and ability to use techniques and skills needed for reducing stress and depression.  Initial Review & Psychosocial Screening:  Initial Psych Review & Screening - 11/20/22 1436       Initial  Review   Current issues with None Identified      Family Dynamics   Good Support System? Yes      Barriers   Psychosocial barriers to participate in program There are no identifiable barriers or psychosocial needs.      Screening Interventions   Interventions Encouraged to exercise;Provide feedback about the scores to participant    Expected Outcomes Short Term goal: Identification and review with participant of any Quality of Life or Depression concerns found by scoring the questionnaire.             Quality of Life Scores:  Quality of Life - 12/07/22 0901       Quality of Life   Select Quality of Life      Quality of Life Scores   Health/Function Pre 13.13 %    Socioeconomic Pre 24.88 %    Psych/Spiritual Pre 23.07 %    Family Pre 22 %    GLOBAL Pre 18.07 %            Scores of 19 and below usually indicate a poorer quality of life in these areas.  A difference of  2-3 points is a clinically meaningful difference.  A difference of 2-3 points in the total score of the Quality of Life Index has been associated with significant improvement in overall quality of life, self-image, physical symptoms, and general health in studies assessing change in quality of life.   PHQ-9: Review Flowsheet       11/20/2022  Depression screen PHQ 2/9  Decreased Interest 0  Down, Depressed, Hopeless 0  PHQ - 2 Score 0  Altered sleeping 0  Tired, decreased energy 2  Change in appetite 0  Feeling bad or failure about yourself  0  Trouble concentrating 0  Moving slowly or fidgety/restless 0  Suicidal thoughts 0  PHQ-9 Score 2  Difficult doing work/chores Very difficult   Interpretation of Total Score  Total Score Depression Severity:  1-4 = Minimal depression, 5-9 = Mild depression, 10-14 = Moderate depression, 15-19 = Moderately severe depression, 20-27 = Severe depression   Psychosocial Evaluation and Intervention:  Psychosocial Evaluation - 11/20/22 1437        Psychosocial Evaluation & Interventions   Interventions Encouraged to exercise with the program and follow exercise prescription;Stress management education;Relaxation education    Comments Patient has no psychosocial barriers or needs identified at his orientation visit. His PHQ-9 score was 2 due to lack of energy. He lives alone. He has a daughter and 2 sons. He says he does not have a good relationship with his sons but his daugther is supportative of him and he also has a few friends that support him. He denies any depression or anxiety but does say he gets lonely since his wife died in Apr 19, 2009. He says he is not able to do the things he used to do like fishing and is not able to do his house cleaning which is frusturating for him. He was not able to complete his walk test today due to low CBG. He says he only eats 2 meals/day and was encouraged to eat 3 meals/day wtih snacks. He says  he does have a lot of hypoglycemic episodes at night. I encouraged him to eat a high protein snack before he goes to bed. He verbalized understanding. He is currently not monitoring his glucose. He had a free style monitor but it stopped working and he is looking for another. He wants to participate in PR hope to gain strength and endurance and improve his walking.    Expected Outcomes Patient will continue to have no psychosocial barriers identified.    Continue Psychosocial Services  No Follow up required             Psychosocial Re-Evaluation:  Psychosocial Re-Evaluation     Row Name 12/07/22 [phone] 01/04/23 0856 02/02/23 1334 02/26/23 1507       Psychosocial Re-Evaluation   Current issues with None Identified None Identified -- None Identified    Comments Patient is new to program attending 3 sessions.  His intital QOL  score is 8.07 and his PHQ score is 2 .  He seems to enjoy coming to class and interactive with class and staff. Patient demonstrates an interest in the program and improving his health .   Patient  was referred to PR by Dr. Shirlee Latch with a diagnosis of Pulmonary HTN and chronic resp failure with hypoxia.  We will continue to monitor as he progresses in the program. Patient has coompleted 10 sessions.   He seems to enjoy coming to class and interactive well with class and staff.  Patient demonstrates an interest in the program and improving his health .   Patient was referred to PR by Dr. Shirlee Latch with a diagnosis of Pulmonary HTN and chronic resp failure with hypoxia.  We will continue to monitor as he progresses in the program. Patient has coompleted 14 sessions.   He seems to enjoy coming to class and interactive well with class and staff.  Patient demonstrates an interest in the program and improving his health .   Patient was referred to PR by Dr. Shirlee Latch with a diagnosis of Pulmonary HTN and chronic resp failure with hypoxia.  We will continue to monitor as he progresses in the program.  Has been absent for 2 1/2 weeks, due to BS being low .   Plans to come back this week. Patient has coompleted 19 sessions.   He seems to enjoy coming to class and interactive well with class and staff.  Patient demonstrates an interest in the program and improving his health .   He continues to demonstrate a positive attitude and continues to work hard in [place].    We will continue to monitor as he progresses in the program.    Expected Outcomes Patient will have no psychosocial barriers identified at discharge. Patient will have no psychosocial barriers identified at discharge. Patient will have no psychosocial barriers identified at discharge. Patient will have no psychosocial barriers identified at discharge.    Interventions Stress management education;Encouraged to attend Pulmonary Rehabilitation for the exercise;Relaxation education Stress management education;Encouraged to attend Pulmonary Rehabilitation for the exercise;Relaxation education Stress management education;Encouraged to attend Pulmonary Rehabilitation for  the exercise;Relaxation education Stress management education;Encouraged to attend Pulmonary Rehabilitation for the exercise;Relaxation education    Continue Psychosocial Services  No Follow up required No Follow up required No Follow up required No Follow up required             Psychosocial Discharge (Final Psychosocial Re-Evaluation):  Psychosocial Re-Evaluation - 02/26/23 1507       Psychosocial Re-Evaluation   Current issues  with None Identified    Comments Patient has coompleted 19 sessions.   He seems to enjoy coming to class and interactive well with class and staff.  Patient demonstrates an interest in the program and improving his health .   He continues to demonstrate a positive attitude and continues to work hard in [place].    We will continue to monitor as he progresses in the program.    Expected Outcomes Patient will have no psychosocial barriers identified at discharge.    Interventions Stress management education;Encouraged to attend Pulmonary Rehabilitation for the exercise;Relaxation education    Continue Psychosocial Services  No Follow up required              Education: Education Goals: Education classes will be provided on a weekly basis, covering required topics. Participant will state understanding/return demonstration of topics presented.  Learning Barriers/Preferences:  Learning Barriers/Preferences - 11/20/22 1432       Learning Barriers/Preferences   Learning Preferences Written Material;Audio;Skilled Demonstration             Education Topics: How Lungs Work and Diseases: - Discuss the anatomy of the lungs and diseases that can affect the lungs, such as COPD. Flowsheet Row PULMONARY REHAB OTHER RESPIRATORY from 02/18/2023 in [facility]  Date 02/18/23  Educator handout       Exercise: -Discuss the importance of exercise, FITT principles of exercise, normal and abnormal responses to exercise, and how to exercise  safely.   Environmental Irritants: -Discuss types of environmental irritants and how to limit exposure to environmental irritants. Flowsheet Row PULMONARY REHAB OTHER RESPIRATORY from 02/18/2023 in [place] [facility]  Date 11/26/22  Educator HB  Instruction Review Code 1- Verbalizes Understanding       Meds/Inhalers and oxygen: - Discuss respiratory medications, definition of an inhaler and oxygen, and the proper way to use an inhaler and oxygen. Flowsheet Row PULMONARY REHAB OTHER RESPIRATORY from 02/18/2023 in [place] [facility]  Date 12/03/22  Educator DM       Energy Saving Techniques: - Discuss methods to conserve energy and decrease shortness of breath when performing activities of daily living.  Flowsheet Row PULMONARY REHAB OTHER RESPIRATORY from 02/18/2023 in [place] [facility]  Date 12/10/22  Educator HB  Instruction Review Code 1- Verbalizes Understanding       Bronchial Hygiene / Breathing Techniques: - Discuss breathing mechanics, pursed-lip breathing technique,  proper posture, effective ways to clear airways, and other functional breathing techniques Flowsheet Row PULMONARY REHAB OTHER RESPIRATORY from 02/18/2023 in [place] [facility]  Date 12/17/22  Educator HB  Instruction Review Code 1- Personnel officer: - Provides group verbal and written instruction about the health risks of elevated stress, cause of high stress, and healthy ways to reduce stress. Flowsheet Row PULMONARY REHAB OTHER RESPIRATORY from 02/18/2023 in [place] [facility]  Date 12/24/22  Educator HB  Instruction Review Code 1- Verbalizes Understanding       Nutrition I: Fats: - Discuss the types of cholesterol, what cholesterol does to the body, and how cholesterol levels can be controlled. Flowsheet Row PULMONARY REHAB OTHER RESPIRATORY from 02/18/2023 in [place] [facility]   Date 12/31/22  Educator Handout       Nutrition II: Labels: -Discuss the different components of food labels and how to read food labels. Flowsheet Row PULMONARY REHAB OTHER RESPIRATORY from 02/18/2023 in [place] [facility]  Date 01/07/23  Educator HB  Instruction Review Code 1- Verbalizes Understanding       Respiratory Infections: - Discuss the signs and symptoms of respiratory infections, ways to prevent respiratory infections, and the importance of seeking medical treatment when having a respiratory infection.   Stress I: Signs and Symptoms: - Discuss the causes of stress, how stress may lead to anxiety and depression, and ways to limit stress.   Stress II: Relaxation: -Discuss relaxation techniques to limit stress. Flowsheet Row PULMONARY REHAB OTHER RESPIRATORY from 02/18/2023 in [place] [facility]  Date 02/04/23  Educator Handout       Oxygen for Home/Travel: - Discuss how to prepare for travel when on oxygen and proper ways to transport and store oxygen to ensure safety. Flowsheet Row PULMONARY REHAB OTHER RESPIRATORY from 02/18/2023 in [place] [facility]  Date 02/11/23  Educator handout       Knowledge Questionnaire Score:  Knowledge Questionnaire Score - 11/20/22 1432       Knowledge Questionnaire Score   Pre Score 10/18             Core Components/Risk Factors/Patient Goals at Admission:  Personal Goals and Risk Factors at Admission - 11/20/22 1433       Core Components/Risk Factors/Patient Goals on Admission    Weight Management Obesity    Improve shortness of breath with ADL's Yes    Intervention Provide education, individualized exercise plan and daily activity instruction to help decrease symptoms of SOB with activities of daily living.    Expected Outcomes Short Term: Improve cardiorespiratory fitness to achieve a reduction of symptoms when performing ADLs;Long Term: Be able to perform more ADLs  without symptoms or delay the onset of symptoms    Diabetes Yes    Intervention Provide education about signs/symptoms and action to take for hypo/hyperglycemia.;Provide education about proper nutrition, including hydration, and aerobic/resistive exercise prescription along with prescribed medications to achieve blood glucose in normal ranges: Fasting glucose 65-99 mg/dL    Expected Outcomes Short Term: Participant verbalizes understanding of the signs/symptoms and immediate care of hyper/hypoglycemia, proper foot care and importance of medication, aerobic/resistive exercise and nutrition plan for blood glucose control.;Long Term: Attainment of HbA1C < 7%.    Personal Goal Other Yes    Personal Goal Patient wants to improve his strength and endurance and be able to longer distances.    Intervention Patient will attend PR 2 days/week with exercise and education.    Expected Outcomes Patient will complete the program meeting both personal and program goals.             Core Components/Risk Factors/Patient Goals Review:   Goals and Risk Factor Review     Row Name 12/07/22 1309 01/04/23 0858 02/02/23 1342 02/26/23 1515       Core Components/Risk Factors/Patient Goals Review   Personal Goals Review Develop more efficient breathing techniques such as purse lipped breathing and diaphragmatic breathing and practicing self-pacing with activity.;Improve shortness of breath with ADL's;Other Develop more efficient breathing techniques such as purse lipped breathing and diaphragmatic breathing and practicing self-pacing with activity.;Improve shortness of breath with ADL's;Other Develop more efficient breathing techniques such as purse lipped breathing and diaphragmatic breathing and practicing self-pacing with activity.;Improve shortness of breath with ADL's;Other Develop more efficient breathing techniques such as purse lipped breathing and diaphragmatic breathing and practicing self-pacing with  activity.;Improve shortness of breath with ADL's;Other    Review Patient is new to the program completing 3 sessions.  He was referred to PR  by Shirlee Latch with a diagnosis of Pulmonary HTN and chronic resp failure with hypoxia.  We will help him with deveoping a efficient breathing technique such as purse lipped breathing and pratice self pacing with increased activity.  When exercising increases O2 up 6L averaging between 94%-97% O2 sats.  Patient tolerate exercise well.  His glucose readings have been within our exercise parameters.  His  personal goals wants to increase strength and endurance, working toward walking longer distances.  We will continue to monitor as he works toward meeting these goals. Patient has completed 10 sessions.  We will help him with deveoping a efficient breathing technique such as purse lipped breathing and pratice self pacing with increased activity.  Patient continues to tolerate exercise well and while exercising increases O2 up 6L averaging between 90%-97% O2 sats.  Patient tolerate exercise well.  His glucose readings have been up and down,  not controlled with diet, will encourage better meal planning.  His  personal goals wants to increase strength and endurance, working toward walking longer distances.  We will continue to monitor as he works toward meeting these goals. Patient has completed 14 sessions.  Has been absent for 2 1/2 weeks, due to low BS.  We will continue to monitor his BS prior to exercise.   Plans to return this week. We will help him with deveoping a efficient breathing technique such as purse lipped breathing and pratice self pacing with increased activity.  Patient continues to tolerate exercise well and while exercising increases O2 up 6L averaging between 90%-97% O2 sats.  Patient tolerate exercise well.   His  personal goals wants to increase strength and endurance, working toward walking longer distances.   We will continue to monitor as he works toward  meeting these goals. Patient has completed 19 sessions.   We will continue to monitor his BS prior to exercise.  Last BS was 118 prior to exercising.  We will help him with deveoping a efficient breathing technique such as purse lipped breathing and pratice self pacing with increased activity.  Patient continues to tolerate exercise well and while exercising increases O2 up 6L averaging between 92%-97% O2 sats.  Patient tolerate exercise well.   His  personal goals wants to increase strength and endurance, working toward walking longer distances.   We will continue to monitor as he works toward meeting these goals.    Expected Outcomes Patient will complete the program meeting both program and personal goals. Patient will complete the program meeting both program and personal goals. Patient will complete the program meeting both program and personal goals. Patient will complete the program meeting both program and personal goals.             Core Components/Risk Factors/Patient Goals at Discharge (Final Review):   Goals and Risk Factor Review - 02/26/23 1515       Core Components/Risk Factors/Patient Goals Review   Personal Goals Review Develop more efficient breathing techniques such as purse lipped breathing and diaphragmatic breathing and practicing self-pacing with activity.;Improve shortness of breath with ADL's;Other    Review Patient has completed 19 sessions.   We will continue to monitor his BS prior to exercise.  Last BS was 118 prior to exercising.  We will help him with deveoping a efficient breathing technique such as purse lipped breathing and pratice self pacing with increased activity.  Patient continues to tolerate exercise well and while exercising increases O2 up 6L averaging between 92%-97% O2 sats.  Patient  tolerate exercise well.   His  personal goals wants to increase strength and endurance, working toward walking longer distances.   We will continue to monitor as he works  toward meeting these goals.    Expected Outcomes Patient will complete the program meeting both program and personal goals.             ITP Comments:  ITP Comments     Row Name 12/29/22 1509 12/31/22 1523 01/28/23 1500       ITP Comments CBG increased to 90, pt sent home.  Pt to re-check when he gets home, follow up with PCP as needed. CBG has increased to 106, then 113.  Tolerating exercise session well. Left patient VM due to his absences the past two weeks. Requested pt call us back.              Comments: ITP REVIEW Pt is making expected progress toward pulmonary rehab goals after completing 20 sessions. Recommend continued exercise, life style modification, education, and utilization of breathing techniques to increase stamina and strength and decrease shortness of breath with exertion.

## 2023-03-16 ENCOUNTER — Encounter (HOSPITAL_COMMUNITY): Payer: Medicare (Managed Care)

## 2023-03-18 ENCOUNTER — Encounter (HOSPITAL_COMMUNITY): Payer: Medicare (Managed Care)

## 2023-03-21 NOTE — Assessment & Plan Note (Signed)
He has not been able to make the lifestyle changes needed to lose any meaningful weight.

## 2023-03-21 NOTE — Assessment & Plan Note (Signed)
From CPAP with good compliance and control Plan-continue CPAP 19.  Next time his machine is replaced we can change to AutoPap.

## 2023-03-21 NOTE — Assessment & Plan Note (Signed)
He benefits and depends on oxygen without change.

## 2023-03-23 ENCOUNTER — Encounter (HOSPITAL_COMMUNITY): Payer: Medicare (Managed Care)

## 2023-03-23 ENCOUNTER — Other Ambulatory Visit: Payer: Self-pay | Admitting: Urology

## 2023-03-23 DIAGNOSIS — N3941 Urge incontinence: Secondary | ICD-10-CM

## 2023-03-25 ENCOUNTER — Telehealth: Payer: Self-pay

## 2023-03-25 ENCOUNTER — Other Ambulatory Visit: Payer: Self-pay

## 2023-03-25 ENCOUNTER — Encounter (HOSPITAL_COMMUNITY): Payer: Medicare (Managed Care)

## 2023-03-25 DIAGNOSIS — N3941 Urge incontinence: Secondary | ICD-10-CM

## 2023-03-25 MED ORDER — TROSPIUM CHLORIDE 20 MG PO TABS: 20.0000 mg | ORAL_TABLET | Freq: Two times a day (BID) | ORAL | 0 refills | Status: DC

## 2023-03-25 NOTE — Telephone Encounter (Signed)
Rx sent. It has been a year since he saw Dr. Annabell Howells. He needs to be scheduled for next available to receive more refills.

## 2023-03-25 NOTE — Telephone Encounter (Signed)
Patient calling to get a refill on:  trospium (SANCTURA) 20 MG tablet   Pharmacy:   [id number] - , [place] - [address] AT SEC OF [address] & [address] Phone: [phone]  Fax: [phone]     Thank you.

## 2023-03-26 NOTE — Progress Notes (Signed)
Discharge Progress Report  Patient Details  Name: Colton Jackson. MRN: [id number] Date of Birth: [birth year] Referring Provider:   Flowsheet Row PULMONARY REHAB OTHER RESPIRATORY from 11/26/2022 in [facility] CARDIAC REHABILITATION  Referring Provider Dr. Shirlee Latch        Number of Visits: 19  Reason for Discharge:  Early Exit:  Injured back.   Smoking History:  Social History   Tobacco Use  Smoking Status Former   Types: Cigarettes   Quit date: 1971   Years since quitting: 53.3  Smokeless Tobacco Never  Tobacco Comments   smoked when he was a teenager    Diagnosis:  Pulmonary HTN (HCC)  Chronic respiratory failure with hypoxia (HCC)  Pulmonary hypertension (HCC)  ADL UCSD:  Pulmonary Assessment Scores     Row Name 11/20/22 1447         ADL UCSD   ADL Phase Entry     SOB Score total 31     Rest 0     Walk 3     Stairs 5     Bath 0     Dress 2     Shop 3       CAT Score   CAT Score 10       mMRC Score   mMRC Score 3              Initial Exercise Prescription:  Initial Exercise Prescription - 11/26/22 1500       Date of Initial Exercise RX and Referring Provider   Date 11/26/22    Referring Provider Dr. Shirlee Latch    Expected Discharge Date 03/25/23      Oxygen   Oxygen Continuous    Liters 4    Maintain Oxygen Saturation 88% or higher      NuStep   Level 1    SPM 80    Minutes 39      Prescription Details   Frequency (times per week) 2    Duration Progress to 30 minutes of continuous aerobic without signs/symptoms of physical distress      Intensity   THRR 40-80% of Max Heartrate 60-120    Ratings of Perceived Exertion 11-13      Resistance Training   Training Prescription Yes    Weight 0    Reps 10-15             Discharge Exercise Prescription (Final Exercise Prescription Changes):  Exercise Prescription Changes - 02/23/23 1500       Response to Exercise   Blood Pressure (Admit) 158/76    Blood Pressure (Exercise)  162/70    Blood Pressure (Exit) 142/72    Heart Rate (Admit) 79 bpm    Heart Rate (Exercise) 89 bpm    Heart Rate (Exit) 80 bpm    Oxygen Saturation (Admit) 95 %    Oxygen Saturation (Exercise) 96 %    Oxygen Saturation (Exit) 94 %    Rating of Perceived Exertion (Exercise) 13    Perceived Dyspnea (Exercise) 13    Duration Continue with 30 min of aerobic exercise without signs/symptoms of physical distress.    Intensity THRR unchanged      Progression   Progression Continue to progress workloads to maintain intensity without signs/symptoms of physical distress.      Resistance Training   Training Prescription Yes    Weight 3    Reps 10-15    Time 10 Minutes      Oxygen   Oxygen Continuous  Liters 6      NuStep   Level 3    SPM 110    Minutes 39    METs 2.6             Functional Capacity:  6 Minute Walk     Row Name 11/26/22 1535         6 Minute Walk   Phase Initial     Distance 650 feet     Walk Time 6 minutes     # of Rest Breaks 1     MPH 1.23     METS 1.14     RPE 12     Perceived Dyspnea  13     VO2 Peak 4     Symptoms Yes (comment)     Comments one sitting break due to SOB and fatigue (1 minute)     Resting HR 72 bpm     Resting BP 130/70     Resting Oxygen Saturation  96 %     Exercise Oxygen Saturation  during 6 min walk 93 %     Max Ex. HR 110 bpm     Max Ex. BP 162/78     2 Minute Post BP 142/78       Interval HR   1 Minute HR 100     2 Minute HR 106     3 Minute HR 111     4 Minute HR 110     5 Minute HR 93     6 Minute HR 109     2 Minute Post HR 87     Interval Heart Rate? Yes       Interval Oxygen   Interval Oxygen? Yes     Baseline Oxygen Saturation % 96 %     1 Minute Oxygen Saturation % 94 %     1 Minute Liters of Oxygen 4 L     2 Minute Oxygen Saturation % 93 %     2 Minute Liters of Oxygen 4 L     3 Minute Oxygen Saturation % 93 %     3 Minute Liters of Oxygen 4 L     4 Minute Oxygen Saturation % 93 %     4  Minute Liters of Oxygen 4 L     5 Minute Oxygen Saturation % 94 %     5 Minute Liters of Oxygen 4 L     6 Minute Oxygen Saturation % 93 %     6 Minute Liters of Oxygen 4 L     2 Minute Post Oxygen Saturation % 98 %     2 Minute Post Liters of Oxygen 4 L              Psychological, QOL, Others - Outcomes: PHQ 2/9:    11/20/2022    2:28 PM  Depression screen PHQ 2/9  Decreased Interest 0  Down, Depressed, Hopeless 0  PHQ - 2 Score 0  Altered sleeping 0  Tired, decreased energy 2  Change in appetite 0  Feeling bad or failure about yourself  0  Trouble concentrating 0  Moving slowly or fidgety/restless 0  Suicidal thoughts 0  PHQ-9 Score 2  Difficult doing work/chores Very difficult    Quality of Life:  Quality of Life - 12/07/22 0901       Quality of Life   Select Quality of Life      Quality of Life Scores   Health/Function Pre  13.13 %    Socioeconomic Pre 24.88 %    Psych/Spiritual Pre 23.07 %    Family Pre 22 %    GLOBAL Pre 18.07 %             Personal Goals: Goals established at orientation with interventions provided to work toward goal.  Personal Goals and Risk Factors at Admission - 11/20/22 1433       Core Components/Risk Factors/Patient Goals on Admission    Weight Management Obesity    Improve shortness of breath with ADL's Yes    Intervention Provide education, individualized exercise plan and daily activity instruction to help decrease symptoms of SOB with activities of daily living.    Expected Outcomes Short Term: Improve cardiorespiratory fitness to achieve a reduction of symptoms when performing ADLs;Long Term: Be able to perform more ADLs without symptoms or delay the onset of symptoms    Diabetes Yes    Intervention Provide education about signs/symptoms and action to take for hypo/hyperglycemia.;Provide education about proper nutrition, including hydration, and aerobic/resistive exercise prescription along with prescribed medications to  achieve blood glucose in normal ranges: Fasting glucose 65-99 mg/dL    Expected Outcomes Short Term: Participant verbalizes understanding of the signs/symptoms and immediate care of hyper/hypoglycemia, proper foot care and importance of medication, aerobic/resistive exercise and nutrition plan for blood glucose control.;Long Term: Attainment of HbA1C < 7%.    Personal Goal Other Yes    Personal Goal Patient wants to improve his strength and endurance and be able to longer distances.    Intervention Patient will attend PR 2 days/week with exercise and education.    Expected Outcomes Patient will complete the program meeting both personal and program goals.              Personal Goals Discharge:  Goals and Risk Factor Review     Row Name 12/07/22 1309 01/04/23 0858 02/02/23 1342 02/26/23 1515 03/26/23 0956     Core Components/Risk Factors/Patient Goals Review   Personal Goals Review Develop more efficient breathing techniques such as purse lipped breathing and diaphragmatic breathing and practicing self-pacing with activity.;Improve shortness of breath with ADL's;Other Develop more efficient breathing techniques such as purse lipped breathing and diaphragmatic breathing and practicing self-pacing with activity.;Improve shortness of breath with ADL's;Other Develop more efficient breathing techniques such as purse lipped breathing and diaphragmatic breathing and practicing self-pacing with activity.;Improve shortness of breath with ADL's;Other Develop more efficient breathing techniques such as purse lipped breathing and diaphragmatic breathing and practicing self-pacing with activity.;Improve shortness of breath with ADL's;Other Develop more efficient breathing techniques such as purse lipped breathing and diaphragmatic breathing and practicing self-pacing with activity.;Improve shortness of breath with ADL's;Other   Review Patient is new to the program completing 3 sessions.  He was referred to PR  by Shirlee Latch with a diagnosis of Pulmonary HTN and chronic resp failure with hypoxia.  We will help him with deveoping a efficient breathing technique such as purse lipped breathing and pratice self pacing with increased activity.  When exercising increases O2 up 6L averaging between 94%-97% O2 sats.  Patient tolerate exercise well.  His glucose readings have been within our exercise parameters.  His  personal goals wants to increase strength and endurance, working toward walking longer distances.  We will continue to monitor as he works toward meeting these goals. Patient has completed 10 sessions.  We will help him with deveoping a efficient breathing technique such as purse lipped breathing and pratice self pacing with increased activity.  Patient continues to tolerate exercise well and while exercising increases O2 up 6L averaging between 90%-97% O2 sats.  Patient tolerate exercise well.  His glucose readings have been up and down,  not controlled with diet, will encourage better meal planning.  His  personal goals wants to increase strength and endurance, working toward walking longer distances.  We will continue to monitor as he works toward meeting these goals. Patient has completed 14 sessions.  Has been absent for 2 1/2 weeks, due to low BS.  We will continue to monitor his BS prior to exercise.   Plans to return this week. We will help him with deveoping a efficient breathing technique such as purse lipped breathing and pratice self pacing with increased activity.  Patient continues to tolerate exercise well and while exercising increases O2 up 6L averaging between 90%-97% O2 sats.  Patient tolerate exercise well.   His  personal goals wants to increase strength and endurance, working toward walking longer distances.   We will continue to monitor as he works toward meeting these goals. Patient has completed 19 sessions.   We will continue to monitor his BS prior to exercise.  Last BS was 118 prior to  exercising.  We will help him with deveoping a efficient breathing technique such as purse lipped breathing and pratice self pacing with increased activity.  Patient continues to tolerate exercise well and while exercising increases O2 up 6L averaging between 92%-97% O2 sats.  Patient tolerate exercise well.   His  personal goals wants to increase strength and endurance, working toward walking longer distances.   We will continue to monitor as he works toward meeting these goals. Patient stopped attending the program 02/23/23. He completed 19 sessions. He said he injuried his back and was not able to return to the program. He did not complete discharge documentation. Not able to measure program outcome goals. His last weigth was 130.3 KG losing 1.7 KG overall.   Expected Outcomes Patient will complete the program meeting both program and personal goals. Patient will complete the program meeting both program and personal goals. Patient will complete the program meeting both program and personal goals. Patient will complete the program meeting both program and personal goals. Hopefully patient will continue to exercise once his back injury is healed.            Exercise Goals and Review:  Exercise Goals     Row Name 11/26/22 1540 12/16/22 0729 01/13/23 0738 02/09/23 1611 03/10/23 1455     Exercise Goals   Increase Physical Activity Yes Yes Yes Yes Yes   Intervention Provide advice, education, support and counseling about physical activity/exercise needs.;Develop an individualized exercise prescription for aerobic and resistive training based on initial evaluation findings, risk stratification, comorbidities and participant's personal goals. Provide advice, education, support and counseling about physical activity/exercise needs.;Develop an individualized exercise prescription for aerobic and resistive training based on initial evaluation findings, risk stratification, comorbidities and participant's  personal goals. Provide advice, education, support and counseling about physical activity/exercise needs.;Develop an individualized exercise prescription for aerobic and resistive training based on initial evaluation findings, risk stratification, comorbidities and participant's personal goals. Provide advice, education, support and counseling about physical activity/exercise needs.;Develop an individualized exercise prescription for aerobic and resistive training based on initial evaluation findings, risk stratification, comorbidities and participant's personal goals. Provide advice, education, support and counseling about physical activity/exercise needs.;Develop an individualized exercise prescription for aerobic and resistive training based on initial evaluation findings, risk stratification, comorbidities  and participant's personal goals.   Expected Outcomes Short Term: Attend rehab on a regular basis to increase amount of physical activity.;Long Term: Add in home exercise to make exercise part of routine and to increase amount of physical activity.;Long Term: Exercising regularly at least 3-5 days a week. Short Term: Attend rehab on a regular basis to increase amount of physical activity.;Long Term: Add in home exercise to make exercise part of routine and to increase amount of physical activity.;Long Term: Exercising regularly at least 3-5 days a week. Short Term: Attend rehab on a regular basis to increase amount of physical activity.;Long Term: Add in home exercise to make exercise part of routine and to increase amount of physical activity.;Long Term: Exercising regularly at least 3-5 days a week. Short Term: Attend rehab on a regular basis to increase amount of physical activity.;Long Term: Add in home exercise to make exercise part of routine and to increase amount of physical activity.;Long Term: Exercising regularly at least 3-5 days a week. Short Term: Attend rehab on a regular basis to increase  amount of physical activity.;Long Term: Add in home exercise to make exercise part of routine and to increase amount of physical activity.;Long Term: Exercising regularly at least 3-5 days a week.   Increase Strength and Stamina Yes Yes Yes Yes Yes   Intervention Provide advice, education, support and counseling about physical activity/exercise needs.;Develop an individualized exercise prescription for aerobic and resistive training based on initial evaluation findings, risk stratification, comorbidities and participant's personal goals. Provide advice, education, support and counseling about physical activity/exercise needs.;Develop an individualized exercise prescription for aerobic and resistive training based on initial evaluation findings, risk stratification, comorbidities and participant's personal goals. Provide advice, education, support and counseling about physical activity/exercise needs.;Develop an individualized exercise prescription for aerobic and resistive training based on initial evaluation findings, risk stratification, comorbidities and participant's personal goals. Provide advice, education, support and counseling about physical activity/exercise needs.;Develop an individualized exercise prescription for aerobic and resistive training based on initial evaluation findings, risk stratification, comorbidities and participant's personal goals. Provide advice, education, support and counseling about physical activity/exercise needs.;Develop an individualized exercise prescription for aerobic and resistive training based on initial evaluation findings, risk stratification, comorbidities and participant's personal goals.   Expected Outcomes Short Term: Increase workloads from initial exercise prescription for resistance, speed, and METs.;Short Term: Perform resistance training exercises routinely during rehab and add in resistance training at home;Long Term: Improve cardiorespiratory fitness,  muscular endurance and strength as measured by increased METs and functional capacity ( ) Short Term: Increase workloads from initial exercise prescription for resistance, speed, and METs.;Short Term: Perform resistance training exercises routinely during rehab and add in resistance training at home;Long Term: Improve cardiorespiratory fitness, muscular endurance and strength as measured by increased METs and functional capacity ( ) Short Term: Increase workloads from initial exercise prescription for resistance, speed, and METs.;Short Term: Perform resistance training exercises routinely during rehab and add in resistance training at home;Long Term: Improve cardiorespiratory fitness, muscular endurance and strength as measured by increased METs and functional capacity ( ) Short Term: Increase workloads from initial exercise prescription for resistance, speed, and METs.;Short Term: Perform resistance training exercises routinely during rehab and add in resistance training at home;Long Term: Improve cardiorespiratory fitness, muscular endurance and strength as measured by increased METs and functional capacity ( ) Short Term: Increase workloads from initial exercise prescription for resistance, speed, and METs.;Short Term: Perform resistance training exercises routinely during rehab and add in resistance training at home;Long Term: Improve cardiorespiratory fitness,  muscular endurance and strength as measured by increased METs and functional capacity ( )   Able to understand and use rate of perceived exertion (RPE) scale Yes Yes Yes Yes Yes   Intervention Provide education and explanation on how to use RPE scale Provide education and explanation on how to use RPE scale Provide education and explanation on how to use RPE scale Provide education and explanation on how to use RPE scale Provide education and explanation on how to use RPE scale   Expected Outcomes Short Term: Able to use RPE daily in  rehab to express subjective intensity level;Long Term:  Able to use RPE to guide intensity level when exercising independently Short Term: Able to use RPE daily in rehab to express subjective intensity level;Long Term:  Able to use RPE to guide intensity level when exercising independently Short Term: Able to use RPE daily in rehab to express subjective intensity level;Long Term:  Able to use RPE to guide intensity level when exercising independently Short Term: Able to use RPE daily in rehab to express subjective intensity level;Long Term:  Able to use RPE to guide intensity level when exercising independently Short Term: Able to use RPE daily in rehab to express subjective intensity level;Long Term:  Able to use RPE to guide intensity level when exercising independently   Able to understand and use Dyspnea scale -- -- -- Yes Yes   Intervention -- -- -- Provide education and explanation on how to use Dyspnea scale Provide education and explanation on how to use Dyspnea scale   Expected Outcomes -- -- -- Short Term: Able to use Dyspnea scale daily in rehab to express subjective sense of shortness of breath during exertion;Long Term: Able to use Dyspnea scale to guide intensity level when exercising independently Short Term: Able to use Dyspnea scale daily in rehab to express subjective sense of shortness of breath during exertion;Long Term: Able to use Dyspnea scale to guide intensity level when exercising independently   Knowledge and understanding of Target Heart Rate Range (THRR) Yes Yes Yes Yes Yes   Intervention Provide education and explanation of THRR including how the numbers were predicted and where they are located for reference Provide education and explanation of THRR including how the numbers were predicted and where they are located for reference Provide education and explanation of THRR including how the numbers were predicted and where they are located for reference Provide education and  explanation of THRR including how the numbers were predicted and where they are located for reference Provide education and explanation of THRR including how the numbers were predicted and where they are located for reference   Expected Outcomes Short Term: Able to state/look up THRR;Short Term: Able to use daily as guideline for intensity in rehab;Long Term: Able to use THRR to govern intensity when exercising independently Short Term: Able to state/look up THRR;Short Term: Able to use daily as guideline for intensity in rehab;Long Term: Able to use THRR to govern intensity when exercising independently Short Term: Able to state/look up THRR;Short Term: Able to use daily as guideline for intensity in rehab;Long Term: Able to use THRR to govern intensity when exercising independently Short Term: Able to state/look up THRR;Short Term: Able to use daily as guideline for intensity in rehab;Long Term: Able to use THRR to govern intensity when exercising independently Short Term: Able to state/look up THRR;Short Term: Able to use daily as guideline for intensity in rehab;Long Term: Able to use THRR to govern intensity when exercising  independently   Able to check pulse independently Yes Yes Yes -- Yes   Intervention Provide education and demonstration on how to check pulse in carotid and radial arteries.;Review the importance of being able to check your own pulse for safety during independent exercise Provide education and demonstration on how to check pulse in carotid and radial arteries.;Review the importance of being able to check your own pulse for safety during independent exercise Provide education and demonstration on how to check pulse in carotid and radial arteries.;Review the importance of being able to check your own pulse for safety during independent exercise -- Provide education and demonstration on how to check pulse in carotid and radial arteries.;Review the importance of being able to check your own  pulse for safety during independent exercise   Expected Outcomes Short Term: Able to explain why pulse checking is important during independent exercise;Long Term: Able to check pulse independently and accurately Short Term: Able to explain why pulse checking is important during independent exercise;Long Term: Able to check pulse independently and accurately Short Term: Able to explain why pulse checking is important during independent exercise;Long Term: Able to check pulse independently and accurately -- Short Term: Able to explain why pulse checking is important during independent exercise;Long Term: Able to check pulse independently and accurately   Understanding of Exercise Prescription Yes Yes Yes Yes Yes   Intervention Provide education, explanation, and written materials on patient's individual exercise prescription Provide education, explanation, and written materials on patient's individual exercise prescription Provide education, explanation, and written materials on patient's individual exercise prescription Provide education, explanation, and written materials on patient's individual exercise prescription Provide education, explanation, and written materials on patient's individual exercise prescription   Expected Outcomes Short Term: Able to explain program exercise prescription;Long Term: Able to explain home exercise prescription to exercise independently Short Term: Able to explain program exercise prescription;Long Term: Able to explain home exercise prescription to exercise independently Short Term: Able to explain program exercise prescription;Long Term: Able to explain home exercise prescription to exercise independently Short Term: Able to explain program exercise prescription;Long Term: Able to explain home exercise prescription to exercise independently Short Term: Able to explain program exercise prescription;Long Term: Able to explain home exercise prescription to exercise independently             Exercise Goals Re-Evaluation:  Exercise Goals Re-Evaluation     Row Name 12/16/22 0729 01/13/23 0739 02/09/23 1611 03/10/23 1455       Exercise Goal Re-Evaluation   Exercise Goals Review Increase Physical Activity;Increase Strength and Stamina;Able to understand and use rate of perceived exertion (RPE) scale;Able to understand and use Dyspnea scale;Knowledge and understanding of Target Heart Rate Range (THRR);Understanding of Exercise Prescription Increase Physical Activity;Increase Strength and Stamina;Able to understand and use rate of perceived exertion (RPE) scale;Able to understand and use Dyspnea scale;Knowledge and understanding of Target Heart Rate Range (THRR);Understanding of Exercise Prescription Increase Physical Activity;Increase Strength and Stamina;Able to understand and use rate of perceived exertion (RPE) scale;Able to understand and use Dyspnea scale;Knowledge and understanding of Target Heart Rate Range (THRR);Understanding of Exercise Prescription Increase Physical Activity;Increase Strength and Stamina;Able to understand and use rate of perceived exertion (RPE) scale;Able to understand and use Dyspnea scale;Knowledge and understanding of Target Heart Rate Range (THRR);Understanding of Exercise Prescription    Comments Pt has completed 7 sessions of PR. His blood sugar has been more consistent over the past few sessions, as he had two episodes of hypoglycemia in the early part of  the program. He has been able to progress his workload, and he is motivated while he is in class. He is currently exercising at 2.2 METs on the stepper. Will continue to monitor and progress as able. Pt has completed 13 sessiosn of PR. His blood sugar continues to be consistent the past sessions and hes able to attend class compare to the first couple sessions. He is tolerating exercise well and has increased his workload. He recently complained of back pain over the weekend and he thinks it  may be due to exercise but after asking more questions he was out shopping and walking around that weekend as well which would contribute to the over use. He is currently exercising at 2.1 METs on the stepper. Will continue to monitor and progress as able. Pt has completed 16 sessions of PR. He did not come to class for 2 weeks due to his blood sugar and doctor visits. His blood sugar has been within the limits for exercise since he came back last week. He is tolerating exericse well and is pushing himself to reach small goals he sets, laps per class. He is currently exercising at 2.4 METs on the stepper. Will continue to monitor and progress as able. Pt has completed 19 sessions of PR. He has not been to rehab since 4/9 and has not called. He was exercising at 2.6 METs on the stepper. Will continuet to monitor and progress as able.    Expected Outcomes Through exercise at rehab and at home, the patient will meet their stated goals. Through exercise at rehab and at home, the patient will meet their stated goals. Through exercise at rehab and at home, the patient will meet their stated goals. Through exercise at rehab and at home, the patient will meet their stated goals.             Nutrition & Weight - Outcomes:  Pre Biometrics - 11/26/22 1541       Pre Biometrics   Height 5\' 8"  (1.727 m)    Weight 131.7 kg    Waist Circumference 56 inches    Hip Circumference 55 inches    Waist to Hip Ratio 1.02 %    BMI (Calculated) 44.16    Triceps Skinfold 40 mm    % Body Fat 45.6 %    Grip Strength 31.3 kg    Flexibility 0 in    Single Leg Stand 0 seconds              Nutrition:  Nutrition Therapy & Goals - 11/20/22 1431       Personal Nutrition Goals   Additional Goals? No    Comments Patient scored 43 on his diet assessment. He cooks for himself mostly. Handouts provided and explained on heathier choices and DM. We offer 2 educational sessions on heart healthy nutrition with handouts  and assistance with RD referral if patient is interested.      Intervention Plan   Intervention Nutrition handout(s) given to patient.    Expected Outcomes Short Term Goal: Understand basic principles of dietary content, such as calories, fat, sodium, cholesterol and nutrients.             Nutrition Discharge:  Nutrition Assessments - 11/20/22 1430       MEDFICTS Scores   Pre Score 43             Education Questionnaire Score:  Knowledge Questionnaire Score - 11/20/22 1432       Knowledge Questionnaire  Score   Pre Score 10/18            Patient stopped attending the program 02/23/23. He completed 19 sessions. He said he injuried his back and was not able to return to the program. He did not complete discharge documentation. Not able to measure program outcome goals. His last weigth was 130.3 KG losing 1.7 KG overall.

## 2023-03-26 NOTE — Addendum Note (Signed)
Encounter addended by: Suann Larry, RN on: 03/26/2023 10:03 AM  Actions taken: Flowsheet data copied forward, Flowsheet accepted, Clinical Note Signed, Episode resolved

## 2023-06-23 ENCOUNTER — Other Ambulatory Visit: Payer: Self-pay | Admitting: Urology

## 2023-06-23 DIAGNOSIS — N3941 Urge incontinence: Secondary | ICD-10-CM

## 2023-06-28 NOTE — Telephone Encounter (Signed)
Pt scheduled with sara on 08/29

## 2023-07-13 DIAGNOSIS — N3281 Overactive bladder: Secondary | ICD-10-CM | POA: Insufficient documentation

## 2023-07-13 DIAGNOSIS — N200 Calculus of kidney: Secondary | ICD-10-CM | POA: Insufficient documentation

## 2023-07-13 DIAGNOSIS — N529 Male erectile dysfunction, unspecified: Secondary | ICD-10-CM | POA: Insufficient documentation

## 2023-07-13 DIAGNOSIS — N3941 Urge incontinence: Secondary | ICD-10-CM | POA: Insufficient documentation

## 2023-07-13 DIAGNOSIS — R3915 Urgency of urination: Secondary | ICD-10-CM | POA: Insufficient documentation

## 2023-07-13 NOTE — Progress Notes (Unsigned)
Name: Rudra Miner. DOB: 1951/05/31 MRN: [id number]  History of Present Illness: Mr. Segovia is a 72 y.o. male who presents today for follow up visit at [facility]. - GU history: 1. OAB with urinary frequency, nocturia, urgency, and urge incontinence. - Exacerbated by diuretic use. - Has done PTNS previously.  - Previously took Trospium & Gemtesa together with improvement but had increased leg swelling.  - Previously tried [place] alone - didn't help and was cost prohibitive. - Currently ***taking Trospium  2. Kidney stones. CT abdomen/pelvis w/ contrast on 06/03/2022 showed 2 punctate non-obstructing stones in the mid left kidney.  3. Erectile dysfunction. - Had some response to Sildenafil.  At last visit with Dr. Annabell Howells on 04/16/2022: The plan was to continue Trospium and add on Myrbetriq 25 mg daily (samples provided).  Since last visit: ***  ***Trospium refills - with glaucoma?? Was this previously checked w/ his eye doctor?  Today: He reports that taking Trospium and Myrbetriq together was ***  He reports {Blank multiple:19197::"improved","persistent / unchanged"} urinary ***frequency, ***nocturia, ***urgency, and ***urge incontinence. Voiding ***x/day and ***x/night on average. Leaking ***x/day on average; using *** pads per day on average.  He {Actions; denies-reports:120008} dysuria, gross hematuria, straining to void, or sensations of incomplete emptying.  He {Actions; denies-reports:120008} recent episode of stone pain/ passage. He {Actions; denies-reports:120008} acute flank pain / abdominal pain.  He {Actions; denies-reports:120008} bother related to erectile dysfunction at this time.   Fall Screening: Do you usually have a device to assist in your mobility? {yes/no:20286} ***cane / ***walker / ***wheelchair   Medications: Current Outpatient Medications  Medication Sig Dispense Refill   acetaminophen (TYLENOL) 500 MG tablet Take 500 mg by  mouth 3 (three) times daily as needed (pain.).     azelastine (ASTELIN) 0.1 % nasal spray 1 puff each nostril twice daily as needed 30 mL 12   Cholecalciferol (VITAMIN D3) 10 MCG (400 UNIT) CAPS Take 1 capsule by mouth daily.     cromolyn (NASALCROM) 5.2 MG/ACT nasal spray Place 1 spray into both nostrils 3 (three) times daily as needed for allergies.      dapagliflozin propanediol (FARXIGA) 10 MG TABS tablet Take 1 tablet (10 mg total) by mouth daily before breakfast. 30 tablet 8   famotidine (PEPCID) 20 MG tablet Take 20 mg by mouth 2 (two) times daily.      furosemide (LASIX) 20 MG tablet Take 1 tablet (20 mg total) by mouth daily. 30 tablet 11   glipiZIDE (GLUCOTROL) 10 MG tablet Take 10 mg by mouth 2 (two) times daily.      HYDROcodone-acetaminophen (NORCO) 10-325 MG tablet Take 1 tablet by mouth 3 (three) times daily as needed for moderate pain.      Krill Oil 500 MG CAPS Take 500 mg by mouth daily.      latanoprost (XALATAN) 0.005 % ophthalmic solution INSTILL 1 DROP IN BOTH EYES EVERY DAY AT BEDTIME 7.5 mL 12   LEVEMIR FLEXPEN 100 UNIT/ML FlexPen Inject 25-50 Units into the skin daily as needed (blood sugar greater than 130.).     losartan (COZAAR) 50 MG tablet Take 0.5 tablets (25 mg total) by mouth daily. 90 tablet 3   meclizine (ANTIVERT) 25 MG tablet Take 25 mg by mouth 3 (three) times daily as needed for dizziness or nausea (when taking hydrocodone/apap).      meloxicam (MOBIC) 7.5 MG tablet Take 7.5 mg by mouth 2 (two) times daily.     metFORMIN (GLUCOPHAGE-XR) 500  MG 24 hr tablet Take 1,000 mg by mouth 2 (two) times daily.     metoprolol succinate (TOPROL-XL) 50 MG 24 hr tablet Take 1 tablet (50 mg total) by mouth daily. 30 tablet 3   OXYGEN Inhale 5 L/hr into the lungs daily. Pt on 6 liters at home     psyllium (METAMUCIL) 58.6 % powder Take 1 packet by mouth 2 (two) times daily.      spironolactone (ALDACTONE) 25 MG tablet Take 1 tablet (25 mg total) by mouth daily. 90 tablet 3    trospium (SANCTURA) 20 MG tablet TAKE 1 TABLET(20 MG) BY MOUTH TWICE DAILY 180 tablet 0   No current facility-administered medications for this visit.    Allergies: Allergies  Allergen Reactions   Covid-19 Mrna Vaccine [employer]) [Covid-19 Mrna Vacc (Moderna)] Other (See Comments)    Joint pain and fatigue   Sulfa Antibiotics Swelling    eyelids    Past Medical History:  Diagnosis Date   Allergic rhinitis    Aneurysm artery, neck (HCC)    Dr. Maple Hudson pulmonologist   Degenerative disk disease    Essential hypertension    GERD (gastroesophageal reflux disease)    H/O cardiovascular stress test 02/02/11   EF 59% - Normal stress test   Hearing deficit, right    Knee pain, bilateral    Lower back pain    Morbid obesity (HCC)    OSA (obstructive sleep apnea)    On CPAP   Thyroid nodule    Type 2 diabetes mellitus (HCC)    Type 2 diabetes mellitus without complication (HCC) 06/26/2008   Qualifier: Diagnosis of  By: Maple Hudson MD, Clinton D    Past Surgical History:  Procedure Laterality Date   APPENDECTOMY     CARDIAC CATHETERIZATION     Cataract surgery Bilateral    COLONOSCOPY WITH PROPOFOL N/A 08/01/2019   Procedure: COLONOSCOPY WITH PROPOFOL;  Surgeon: Franky Macho, MD;  Location: [facility];  Service: General;  Laterality: N/A;   KNEE ARTHROSCOPY Bilateral    NASAL SEPTOPLASTY W/ TURBINOPLASTY     RIGHT HEART CATH N/A 07/13/2022   Procedure: RIGHT HEART CATH;  Surgeon: Laurey Morale, MD;  Location: [facility] INVASIVE CV LAB;  Service: Cardiovascular;  Laterality: N/A;   RIGHT/LEFT HEART CATH AND CORONARY ANGIOGRAPHY N/A 06/23/2018   Procedure: RIGHT/LEFT HEART CATH AND CORONARY ANGIOGRAPHY;  Surgeon: Laurey Morale, MD;  Location: [facility] INVASIVE CV LAB;  Service: Cardiovascular;  Laterality: N/A;   Family History  Problem Relation Age of Onset   Lung disease Father    Stroke Father 96   Diabetes Father    Diabetes Maternal Grandfather    Heart disease Paternal Grandmother     Stroke Paternal Grandfather 25   Diabetes Paternal Grandfather    Diabetes Mother    Cancer Mother    Stroke Mother 78   Diabetes Sister    Diabetes Brother 77   Social History   Socioeconomic History   Marital status: Widowed    Spouse name: Not on file   Number of children: Not on file   Years of education: Not on file   Highest education level: Not on file  Occupational History   Occupation: former Psychologist, occupational, tow truck service  Tobacco Use   Smoking status: Former    Current packs/day: 0.00    Types: Cigarettes    Quit date: 1971    Years since quitting: 53.6   Smokeless tobacco: Never   Tobacco comments:  smoked when he was a teenager  Vaping Use   Vaping status: Never Used  Substance and Sexual Activity   Alcohol use: No   Drug use: No   Sexual activity: Not on file  Other Topics Concern   Not on file  Social History Narrative   Not on file   Social Determinants of Health   Financial Resource Strain: Not on file  Food Insecurity: Not on file  Transportation Needs: Not on file  Physical Activity: Not on file  Stress: Not on file  Social Connections: Not on file  Intimate Partner Violence: Not on file    Review of Systems Constitutional: Patient ***denies any unintentional weight loss or change in strength lntegumentary: Patient ***denies any rashes or pruritus Eyes: Patient denies ***dry eyes ENT: Patient ***denies dry mouth Cardiovascular: Patient ***denies chest pain or syncope Respiratory: Patient ***denies shortness of breath Gastrointestinal: Patient ***denies nausea, vomiting, constipation, or diarrhea Musculoskeletal: Patient ***denies muscle cramps or weakness Neurologic: Patient ***denies convulsions or seizures Psychiatric: Patient ***denies memory problems Allergic/Immunologic: Patient ***denies recent allergic reaction(s) Hematologic/Lymphatic: Patient denies bleeding tendencies Endocrine: Patient ***denies heat/cold intolerance  GU: As  per HPI.  OBJECTIVE There were no vitals filed for this visit. There is no height or weight on file to calculate BMI.  Physical Examination Constitutional: ***No obvious distress; patient is ***non-toxic appearing  Cardiovascular: ***No visible lower extremity edema.  Respiratory: The patient does ***not have audible wheezing/stridor; respirations do ***not appear labored  Gastrointestinal: Abdomen ***non-distended Musculoskeletal: ***Normal ROM of UEs  Skin: ***No obvious rashes/open sores  Neurologic: CN 2-12 grossly ***intact Psychiatric: Answered questions ***appropriately with ***normal affect  Hematologic/Lymphatic/Immunologic: ***No obvious bruises or sites of spontaneous bleeding  UA: ***negative / *** WBC/hpf, *** RBC/hpf, bacteria (***) PVR: *** ml  ASSESSMENT No diagnosis found. *** We discussed the symptoms of overactive bladder (OAB), which include urinary urgency, frequency, nocturia, with or without urge incontinence.   While we may not know the exact etiology of OAB, several risk factors can be identified.  - We discussed this patient's neurogenic risk factors for OAB-type symptoms including ***T2DM, ***nicotine use, ***.  - Likely exacerbated by ***diuretic use, ***caffeine intake, ***consumption of bladder irritants such as (acidic foods, spicy foods, alcohol).   We discussed the following management options in detail including potential benefits, risks, and side effects: Behavioral therapy: Modify fluid intake Decreasing bladder irritants (such as caffeine, acidic foods, spicy foods, alcohol) Urge suppression strategies Bladder retraining / timed voiding Double voiding Medication(s): - For anticholinergic medications, we discussed the potential side effects of anticholinergics including dry eyes, dry mouth, constipation, cognitive impairment and urinary retention.  - For beta-3 agonist medication, we discussed the risk for urinary retention and the potential  side effect of elevated blood pressure specific to Myrbetriq (which is more likely to occur in individuals with uncontrolled hypertension).  For refractory cases: PTNS (posterior tibial nerve stimulation) Sacral neuromodulation trial (Medtronic lnterStim or Axonics implant) Bladder Botox injections In extreme cases, SP tube placement  ***In order to further evaluate urinary incontinence, He was instructed to complete a 3-day bladder diary.  ***Discussed recommendation for urodynamic testing, which was described in detail including risks such as bleeding, infection, organ/tissue/nerve damage.  He decided to proceed with *** ***work on behavioral modifications including ***minimizing caffeine intake and working on ***timed voiding.  Will plan for follow up in ***8 weeks / *** months / ***1 year or sooner if needed. Pt verbalized understanding and agreement. All questions were answered.  PLAN  Advised the following: *** ***. Minimize caffeine intake. ***. Work on timed voiding. ***. Urge suppression strategies. ***. Double/ triple voiding. ***. No follow-ups on file.  No orders of the defined types were placed in this encounter.   It has been explained that the patient is to follow regularly with their PCP in addition to all other providers involved in their care and to follow instructions provided by these respective offices. Patient advised to contact urology clinic if any urologic-pertaining questions, concerns, new symptoms or problems arise in the interim period.  There are no Patient Instructions on file for this visit.  Electronically signed by:  Donnita Falls, FNP   07/13/23    10:11 AM

## 2023-07-15 ENCOUNTER — Encounter: Payer: Self-pay | Admitting: Urology

## 2023-07-15 ENCOUNTER — Ambulatory Visit: Payer: Medicare (Managed Care) | Admitting: Urology

## 2023-07-15 VITALS — BP 137/83 | HR 80 | Temp 98.4°F

## 2023-07-15 DIAGNOSIS — N3281 Overactive bladder: Secondary | ICD-10-CM

## 2023-07-15 DIAGNOSIS — N3941 Urge incontinence: Secondary | ICD-10-CM | POA: Diagnosis not present

## 2023-07-15 DIAGNOSIS — R3915 Urgency of urination: Secondary | ICD-10-CM

## 2023-07-15 DIAGNOSIS — R3916 Straining to void: Secondary | ICD-10-CM

## 2023-07-15 DIAGNOSIS — N401 Enlarged prostate with lower urinary tract symptoms: Secondary | ICD-10-CM

## 2023-07-15 DIAGNOSIS — R3912 Poor urinary stream: Secondary | ICD-10-CM

## 2023-07-15 DIAGNOSIS — N529 Male erectile dysfunction, unspecified: Secondary | ICD-10-CM | POA: Diagnosis not present

## 2023-07-15 DIAGNOSIS — N200 Calculus of kidney: Secondary | ICD-10-CM

## 2023-07-15 DIAGNOSIS — R350 Frequency of micturition: Secondary | ICD-10-CM

## 2023-07-15 LAB — URINALYSIS, ROUTINE W REFLEX MICROSCOPIC
Bilirubin, UA: NEGATIVE
Glucose, UA: NEGATIVE
Ketones, UA: NEGATIVE
Nitrite, UA: NEGATIVE
Protein,UA: NEGATIVE
RBC, UA: NEGATIVE
Specific Gravity, UA: 1.01 (ref 1.005–1.030)
Urobilinogen, Ur: 0.2 mg/dL (ref 0.2–1.0)
pH, UA: 5 (ref 5.0–7.5)

## 2023-07-15 LAB — BLADDER SCAN AMB NON-IMAGING: Scan Result: 207

## 2023-07-15 MED ORDER — TROSPIUM CHLORIDE 20 MG PO TABS
20.0000 mg | ORAL_TABLET | Freq: Two times a day (BID) | ORAL | 3 refills | Status: DC
Start: 2023-07-15 — End: 2024-03-28

## 2023-07-15 MED ORDER — TAMSULOSIN HCL 0.4 MG PO CAPS: 0.4000 mg | ORAL_CAPSULE | Freq: Every day | ORAL | 11 refills | Status: DC

## 2023-07-15 MED ORDER — TROSPIUM CHLORIDE 20 MG PO TABS: 20.0000 mg | ORAL_TABLET | Freq: Two times a day (BID) | ORAL | 3 refills | Status: DC

## 2023-07-16 LAB — URINALYSIS, ROUTINE W REFLEX MICROSCOPIC
Bilirubin, UA: NEGATIVE
Ketones, UA: NEGATIVE
Leukocytes,UA: NEGATIVE
Nitrite, UA: NEGATIVE
Protein,UA: NEGATIVE
RBC, UA: NEGATIVE
Specific Gravity, UA: 1.02 (ref 1.005–1.030)
Urobilinogen, Ur: 0.2 mg/dL (ref 0.2–1.0)
pH, UA: 5.5 (ref 5.0–7.5)

## 2023-07-16 NOTE — Addendum Note (Signed)
Addended by: Christoper Fabian R on: 07/16/2023 08:47 AM   Modules accepted: Orders

## 2023-07-16 NOTE — Progress Notes (Signed)
Pt is prepped for and in and out catherization. Patient was cleaned and prepped in a sterle fashion with betadine. A 14 fr catheter foley was inserted. Urine return was note 270 ml.  Performed by Kennyth Lose, CMA  Urine sent Lab

## 2023-07-17 LAB — URINE CULTURE: Organism ID, Bacteria: NO GROWTH

## 2023-07-20 ENCOUNTER — Telehealth: Payer: Self-pay

## 2023-07-20 NOTE — Telephone Encounter (Signed)
Tried calling patient with no answer, left vm for return call to office

## 2023-07-20 NOTE — Telephone Encounter (Signed)
-----   Message from Donnita Falls sent at 07/20/2023  9:00 AM EDT ----- Please notify patient: Negative urine culture, no antibiotic needed at this time. Thanks.

## 2023-08-12 ENCOUNTER — Encounter: Payer: Self-pay | Admitting: Urology

## 2023-08-12 ENCOUNTER — Ambulatory Visit: Payer: Medicare (Managed Care) | Admitting: Urology

## 2023-08-12 VITALS — BP 152/79 | HR 86

## 2023-08-12 DIAGNOSIS — R3915 Urgency of urination: Secondary | ICD-10-CM

## 2023-08-12 DIAGNOSIS — R350 Frequency of micturition: Secondary | ICD-10-CM

## 2023-08-12 DIAGNOSIS — N401 Enlarged prostate with lower urinary tract symptoms: Secondary | ICD-10-CM

## 2023-08-12 DIAGNOSIS — R3916 Straining to void: Secondary | ICD-10-CM

## 2023-08-12 DIAGNOSIS — N3281 Overactive bladder: Secondary | ICD-10-CM | POA: Diagnosis not present

## 2023-08-12 DIAGNOSIS — R3129 Other microscopic hematuria: Secondary | ICD-10-CM

## 2023-08-12 DIAGNOSIS — R3912 Poor urinary stream: Secondary | ICD-10-CM

## 2023-08-12 DIAGNOSIS — N3941 Urge incontinence: Secondary | ICD-10-CM

## 2023-08-12 MED ORDER — CEPHALEXIN 250 MG PO CAPS
500.0000 mg | ORAL_CAPSULE | Freq: Once | ORAL | Status: AC
Start: 2023-08-12 — End: 2023-08-12
  Administered 2023-08-12: 500 mg via ORAL

## 2023-08-12 MED ORDER — CIPROFLOXACIN HCL 500 MG PO TABS
500.0000 mg | ORAL_TABLET | Freq: Once | ORAL | Status: DC
Start: 2023-08-12 — End: 2023-08-12

## 2023-08-12 MED ORDER — TAMSULOSIN HCL 0.4 MG PO CAPS: 0.4000 mg | ORAL_CAPSULE | Freq: Every day | ORAL | 3 refills | Status: DC

## 2023-08-12 NOTE — Progress Notes (Deleted)
HPI- M never smoker followed for Chronic Hypoxic Respiratory Failure, Allergic rhinitis, OSA, morbid obesity, obesity/ hypoventilation, complicated by back pain, DM, HBP, glaucoma, pulmonary hypertension( Cardiology) ACE level -08/05/11- WNL 24 ACE level 01/15/21- 14 (9-67) NPSG 09/22/14- AHI 72.7/ hr, CPAP to 19, weight 332 lbs PFT 10/21/16-  severe obstruction and restriction with diffusion relatively high for alveolar volume Echocardiogram 10/12/16- BNP 10/09/17- 20.5   WNL O2 qualifying Walk Test 08/07/20-- desat to 87% on rrom air, 94% on 3L HRCT chest 04/08/20- post infectious or inflammatory scarring and volume loss. There are no specific findings to suggest fibrotic interstitial lung disease.  There are numerous prominent, hyperdense, although not pathologically enlarged mediastinal and hilar lymph nodes, unchanged compared to prior examination and most consistent with nodal involvement of sarcoidosis, although occupational inhalational lung disease may also have this appearance. 4. Coronary artery disease. Aortic Atherosclerosis (ICD10-I70.0). 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 Download- compliance 97%, AHI 4.8/ hr Body weight today 288 lbs Covid vax- 3 Moderna Flu vax -  Pulmonary Rehab ------Pt is doing well Download reviewed.  Doing well with CPAP plus oxygen at night. Does not feel that his breathing is any different-certainly no worse. He has not been able to lose weight which is a major factor.  08/13/23- 72 year old male former light  smoker/ retired Psychologist, occupational) followed for Chronic Hypoxic Respiratory Failure, allergic rhinitis, OSA, Obesity/Hypoventilation, Pulmonary Hypertension(cards), complicated by Morbid Obesity, back pain, DM1/ retinopathy, HBP, Glaucoma, CAD, Aortic Atherosclerosis, Thyroid Nodule,  -Nasalcrom, Astelin,  CPAP 19/ [employer]     O2 2-5 L/Adapt      Requalified for O2 with walk test 03/26/20. Download- compliance  Body weight today Did Pulmonary Rehab.  CXR 02/24/23-  IMPRESSION: No active cardiopulmonary disease.  ROS-see HPI   += positive Constitutional:    weight loss, night sweats, fevers, chills, fatigue, lassitude. HEENT:    headaches, difficulty swallowing, tooth/dental problems, sore throat,       sneezing, itching, ear ache, nasal congestion, post nasal drip, snoring CV:    chest pain, orthopnea, PND, swelling in lower extremities, anasarca,                                   dizziness, palpitations Resp:  + shortness of breath with exertion or at rest.                productive cough,   non-productive cough, coughing up of blood.              change in color of mucus.  wheezing.   Skin:    rash or lesions. GI:  No-   heartburn, indigestion, abdominal pain, nausea, vomiting, diarrhea,                 change in bowel habits, loss of appetite GU: dysuria, change in color of urine, no urgency or frequency.   flank pain. MS:   +joint pain, stiffness, decreased range of motion, back pain. Neuro-     nothing unusual Psych:  change in mood or affect.  depression or anxiety.   memory loss.  OBJ- Physical Exam     His O2 2L  + morbidly obese, +on O2 2L tank General- Alert, Oriented, Affect-appropriate, Distress- none acute Skin- rash-none, lesions- none, excoriation- none Lymphadenopathy- none Head- atraumatic            Eyes- Gross vision intact, PERRLA, conjunctivae and secretions clear            Ears- Hearing, canals-normal            Nose- Clear, no-Septal dev, mucus, polyps, erosion,  perforation             Throat- Mallampati III , mucosa clear , drainage- none, tonsils- atrophic Neck- flexible , trachea midline, no stridor , thyroid nl, carotid no bruit Chest - symmetrical excursion , unlabored           Heart/CV- RRR , no murmur , no gallop  , no rub, nl s1 s2                           - JVD- none , edema- none, stasis changes- none, varices- none           Lung- clear/distant- I don't hear rales or crackles., wheeze- none, cough- none , dullness-none, rub- none           Chest wall-  Abd-  Br/ Gen/ Rectal- Not done, not indicated Extrem- cyanosis- none, clubbing, none, atrophy- none, strength- nl Neuro- grossly intact to observation

## 2023-08-13 ENCOUNTER — Ambulatory Visit: Payer: Medicare (Managed Care) | Admitting: Internal Medicine

## 2023-08-13 LAB — URINALYSIS, ROUTINE W REFLEX MICROSCOPIC
Bilirubin, UA: NEGATIVE
Ketones, UA: NEGATIVE
Nitrite, UA: NEGATIVE
Protein,UA: NEGATIVE
Specific Gravity, UA: 1.02 (ref 1.005–1.030)
Urobilinogen, Ur: 0.2 mg/dL (ref 0.2–1.0)
pH, UA: 6 (ref 5.0–7.5)

## 2023-08-13 LAB — MICROSCOPIC EXAMINATION: Bacteria, UA: NONE SEEN

## 2023-09-30 ENCOUNTER — Other Ambulatory Visit (HOSPITAL_COMMUNITY): Payer: Self-pay | Admitting: Family Medicine

## 2023-12-14 DIAGNOSIS — Z6841 Body Mass Index (BMI) 40.0 and over, adult: Secondary | ICD-10-CM | POA: Diagnosis not present

## 2023-12-14 DIAGNOSIS — G894 Chronic pain syndrome: Secondary | ICD-10-CM | POA: Diagnosis not present

## 2023-12-17 DIAGNOSIS — Z794 Long term (current) use of insulin: Secondary | ICD-10-CM | POA: Diagnosis not present

## 2023-12-17 DIAGNOSIS — I700 Atherosclerosis of aorta: Secondary | ICD-10-CM | POA: Diagnosis not present

## 2023-12-17 DIAGNOSIS — E113492 Type 2 diabetes mellitus with severe nonproliferative diabetic retinopathy without macular edema, left eye: Secondary | ICD-10-CM | POA: Diagnosis not present

## 2023-12-20 ENCOUNTER — Other Ambulatory Visit (HOSPITAL_COMMUNITY): Payer: Self-pay | Admitting: Cardiology

## 2023-12-20 MED ORDER — LOSARTAN POTASSIUM 50 MG PO TABS: 25.0000 mg | ORAL_TABLET | Freq: Every day | ORAL | 3 refills | Status: DC

## 2023-12-21 DIAGNOSIS — E109 Type 1 diabetes mellitus without complications: Secondary | ICD-10-CM | POA: Diagnosis not present

## 2024-01-10 DIAGNOSIS — G894 Chronic pain syndrome: Secondary | ICD-10-CM | POA: Diagnosis not present

## 2024-02-10 ENCOUNTER — Ambulatory Visit: Payer: No Typology Code available for payment source | Admitting: Urology

## 2024-02-17 DIAGNOSIS — J449 Chronic obstructive pulmonary disease, unspecified: Secondary | ICD-10-CM | POA: Diagnosis not present

## 2024-02-17 DIAGNOSIS — Z794 Long term (current) use of insulin: Secondary | ICD-10-CM | POA: Diagnosis not present

## 2024-02-17 DIAGNOSIS — I272 Pulmonary hypertension, unspecified: Secondary | ICD-10-CM | POA: Diagnosis not present

## 2024-02-17 DIAGNOSIS — Z6841 Body Mass Index (BMI) 40.0 and over, adult: Secondary | ICD-10-CM | POA: Diagnosis not present

## 2024-02-17 DIAGNOSIS — E785 Hyperlipidemia, unspecified: Secondary | ICD-10-CM | POA: Diagnosis not present

## 2024-02-17 DIAGNOSIS — G894 Chronic pain syndrome: Secondary | ICD-10-CM | POA: Diagnosis not present

## 2024-02-17 DIAGNOSIS — I700 Atherosclerosis of aorta: Secondary | ICD-10-CM | POA: Diagnosis not present

## 2024-02-17 DIAGNOSIS — E113492 Type 2 diabetes mellitus with severe nonproliferative diabetic retinopathy without macular edema, left eye: Secondary | ICD-10-CM | POA: Diagnosis not present

## 2024-03-13 ENCOUNTER — Other Ambulatory Visit (HOSPITAL_COMMUNITY): Payer: Self-pay | Admitting: Cardiology

## 2024-03-13 MED ORDER — FUROSEMIDE 20 MG PO TABS: 20.0000 mg | ORAL_TABLET | Freq: Every day | ORAL | 0 refills | Status: DC

## 2024-03-17 DIAGNOSIS — G894 Chronic pain syndrome: Secondary | ICD-10-CM | POA: Diagnosis not present

## 2024-03-17 DIAGNOSIS — E538 Deficiency of other specified B group vitamins: Secondary | ICD-10-CM | POA: Diagnosis not present

## 2024-03-17 DIAGNOSIS — Z6841 Body Mass Index (BMI) 40.0 and over, adult: Secondary | ICD-10-CM | POA: Diagnosis not present

## 2024-03-27 ENCOUNTER — Telehealth (HOSPITAL_COMMUNITY): Payer: Self-pay

## 2024-03-27 NOTE — Telephone Encounter (Signed)
 Called to confirm/remind patient of their appointment at [facility] on 03/28/2024 0930.   Appointment:    [x] Left mess  Patient reminded to bring all medications and/or complete list.  Confirmed patient has transportation. Gave directions, instructed to utilize valet parking.

## 2024-03-27 NOTE — Progress Notes (Incomplete)
 PCP: Dr. Glady Laming Cardiology: Dr. Londa Rival HF Cardiology: Dr. Mitzie Anda  73 y.o. with history of OHS/OSA, chronic bronchitis, and suspected pulmonary hypertension was referred by Dr. Londa Rival for evaluation of pulmonary hypertension. Patient is on 2L home oxygen  and CPAP. He has had gradually worsening exertional dyspnea over the last year.  He was able to mow his grass last fall but cannot now.  He is short of breath after walking about 50-60 feet with his oxygen  on.  He is short of breath with any incline.   Echo in 3/19 showed normal EF with PASP 72 mmHg.  I had him do left/right heart cath in 8/19.  This showed no CAD and moderate mixed pulmonary venous/pulmonary arterial hypertension.  Very mildly elevated filling pressures.    He is followed by pulmonary.  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 V/Q scan in 9/19 did not show evidence for chronic PE.   Admitted 7/23 with SBO, treated medically. Has a/c respiratory failure and cardiology consulted. Echo showed EF 70-75%, hyperdynamic EF with moderate LVH, RV normal systolic function, severely elevated pulmonary artery systolic pressures 77.2 mm Hg. Discharged home on Lasix  40 mg, weight 273 lbs. Advised HF follow up to discuss RHC.  RHC 8/23 showed near normal filling pressures, severe pulmonary arterial hypertension, concern for group 3 PH with OHS/OSA, but cannot totally rule out group 1 component, and preserved cardiac output. Recommend continue management of OSA/OHS with oxygen /CPAP.  Follow up 12/23, Farxiga  started but creatinine jumped 1.23-->2.41. Lasix  and losartan  held x 2 days, then resumed at lower doses. Farxiga  stopped.  Today he returns for HF follow up. Overall feeling fine. He has dyspnea walking further distances on flat ground with his rolling walker. He wears 5L oxygen  at home, CPAP at night. Rare atypical chest pain. Denies palpitations, abnormal  bleeding, dizziness, edema, or PND/Orthopnea. Appetite ok. No fever or chills. Weight at home 284 pounds. Taking all medications. Started going to [employer] but blood sugar started dropping and he had to stop. Plans to restart next week. Home blood sugars 59-200.   ReDs: 38%  ECG (personally reviewed): none ordered today.  Labs (11/18): BNP 21 Labs (7/19): LDL 79 Labs (8/19): K 3.9, creatinine 0.73 Labs (9/19): ACE normal, K 3.8, creatinine 1.13 Labs (7/23): K 3.2, creatinine 1.11 Labs (8/23): K 3.8, creatinine 1.12, BNP 46, LDL 62, TGs 111 Labs (12/23): K 3.8, creatinine 2.05 Labs (1/24): K 4.5, creatinine 1.64  PMH: 1. OHS/OSA: Uses 5L home oxygen  and CPAP.  2. Obesity 3. HTN 4. Type II diabetes 5. Chronic bronchitis: ?from welding.  Never smoked.    - CT chest (3/19): coronary calcification, mild emphysema, calcified mediastinal and hilar nodes consistent with prior granulomatous infection.  - PFTs (12/17): FVC 56%, FEV1 57%, ratio 101%, SVC 109%, DLCO 66%.  Moderate obstruction/severe restriction.  6. CAD: Coronary calcification on CT chest 3/19.   - Cardiolite in 2012 showed no ischemia.  - LHC (8/19): No significant CAD.  7. Pulmonary hypertension: Echo (3/19) with EF 65-70%, moderate LVH, PASP 72 mmHg.  - RHC (8/19): mean RA 8, PA 67/24 mean 43, mean PCWP 17, CI 2.99, PVR 3.5 WU.  - V/Q scan (9/19): No evidence for chronic PE.  - Echo (7/23): EF 70-75%, hyperdynamic EF with moderate LVH, RV normal systolic function, PASP 77.2 mmHg - RHC (8/23): mean RA 9, PA 75/27 mean 45, mean PCWP 13, CI 2.99, PVR  4.6 WU.  8. 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 Lives in [place].   Family History  Problem Relation Age of Onset   Lung disease Father    Stroke Father 53   Diabetes Father    Diabetes Maternal Grandfather    Heart disease Paternal Grandmother    Stroke Paternal Grandfather 32   Diabetes Paternal Grandfather     Diabetes Mother    Cancer Mother    Stroke Mother 37   Diabetes Sister    Diabetes Brother 36   ROS: All systems reviewed and negative except as per HPI.   Current Outpatient Medications  Medication Sig Dispense Refill   acetaminophen  (TYLENOL ) 500 MG tablet Take 500 mg by mouth 3 (three) times daily as needed (pain.).     azelastine  (ASTELIN ) 0.1 % nasal spray 1 puff each nostril twice daily as needed 30 mL 12   Cholecalciferol (VITAMIN D3) 10 MCG (400 UNIT) CAPS Take 1 capsule by mouth daily.     cromolyn  (NASALCROM ) 5.2 MG/ACT nasal spray Place 1 spray into both nostrils 3 (three) times daily as needed for allergies.      dapagliflozin  propanediol (FARXIGA ) 10 MG TABS tablet Take 1 tablet (10 mg total) by mouth daily before breakfast. 30 tablet 8   famotidine  (PEPCID ) 20 MG tablet Take 20 mg by mouth 2 (two) times daily.      furosemide  (LASIX ) 20 MG tablet Take 1 tablet (20 mg total) by mouth daily. Please have patient call for office visit [phone] 30 tablet 0   glipiZIDE  (GLUCOTROL ) 10 MG tablet Take 10 mg by mouth 2 (two) times daily.      HYDROcodone -acetaminophen  (NORCO) 10-325 MG tablet Take 1 tablet by mouth 3 (three) times daily as needed for moderate pain.      Krill Oil 500 MG CAPS Take 500 mg by mouth daily.      latanoprost  (XALATAN ) 0.005 % ophthalmic solution INSTILL 1 DROP IN BOTH EYES EVERY DAY AT BEDTIME 7.5 mL 12   LEVEMIR  FLEXPEN 100 UNIT/ML FlexPen Inject 25-50 Units into the skin daily as needed (blood sugar greater than 130.).     losartan  (COZAAR ) 50 MG tablet Take 0.5 tablets (25 mg total) by mouth daily. 45 tablet 3   meclizine  (ANTIVERT ) 25 MG tablet Take 25 mg by mouth 3 (three) times daily as needed for dizziness or nausea (when taking hydrocodone /apap).      meloxicam (MOBIC) 7.5 MG tablet Take 7.5 mg by mouth 2 (two) times daily.     metFORMIN  (GLUCOPHAGE -XR) 500 MG 24 hr tablet Take 1,000 mg by mouth 2 (two) times daily.     metoprolol  succinate  (TOPROL -XL) 50 MG 24 hr tablet Take 1 tablet (50 mg total) by mouth daily. 30 tablet 3   OXYGEN  Inhale 5 L/hr into the lungs daily. Pt on 6 liters at home     psyllium (METAMUCIL) 58.6 % powder Take 1 packet by mouth 2 (two) times daily.      spironolactone  (ALDACTONE ) 25 MG tablet TAKE 1 TABLET(25 MG) BY MOUTH DAILY 90 tablet 3   tamsulosin  (FLOMAX ) 0.4 MG CAPS capsule Take 1 capsule (0.4 mg total) by mouth daily. 90 capsule 3   trospium  (SANCTURA ) 20 MG tablet Take 1 tablet (20 mg total) by mouth 2 (two) times daily. 180 tablet 3   No current facility-administered medications for this visit.   Wt Readings from Last 3 Encounters:  02/24/23 127.9 kg (282 lb)  02/23/23 130.3 kg (287 lb 4.2 oz)  02/09/23 129.8 kg (286 lb 2.5 oz)   There were no vitals taken for this visit. Physical Exam General:  NAD. No resp difficulty, walked into clinic with RW on oxygen  HEENT: Normal Neck: Supple. JVP 7-8, thick neck. Carotids 2+ bilat; no bruits. No lymphadenopathy or thryomegaly appreciated. Cor: PMI nondisplaced. Regular rate & rhythm. No rubs, gallops or murmurs. Lungs: Clear Abdomen: Soft, obese, nontender, nondistended. No hepatosplenomegaly. No bruits or masses. Good bowel sounds. Extremities: No cyanosis, clubbing, rash, edema Neuro: Alert & oriented x 3, cranial nerves grossly intact. Moves all 4 extremities w/o difficulty. Affect pleasant.  Assessment/Plan: 1. Pulmonary hypertension: Echo in 3/19 showed estimated PA systolic pressure 72.  He has chronic bronchitis as well as OHS/OSA.  Interestingly, chest CT showed calcified mediastinal and hilar nodes suggestive of prior granulomatous infection => could this actually represent sarcoidosis?  ACE level is normal, and per his pulmonologist Dr. Linder Revere, sarcoidosis is unlikely. RHC 8/19 showed mild pulmonary hypertension, mildly elevated PCWP.  Suspect there is group 2 PH from mildly elevated PCWP but also group 3 PH from OHS/OSA and parenchymal lung  disease.  Unlikely group 1.  PVR only 3.5 WU. Echo (7/23) EF 70-75%, hyperdynamic EF with moderate LVH, RV normal systolic function, severely elevated pulmonary artery systolic pressures 77.2 mm Hg.  RHC (8/23) showed near normal filling pressures, severe pulmonary arterial hypertension, suspect primarily group 3 PH with OHS/OSA. Would hold off on selective pulmonary vasodilators. NYHA II-early III functional status confounded by physical deconditioning and body habitus. He is mildly volume overloaded on exam, REDs 38%. - Restart Farxiga  10 mg daily. BMET and BNP today. Will notify PCP, may need to drop glipizide . - Continue Lasix  40 mg daily.  - Continue spironolactone  25 mg daily.  - Continue losartan  25 mg daily (recently reduced with elevated SCr). - Continue pulmonary rehab.  2. CAD: Coronary calcification on 3/19 chest CT.  Patient has a history of chest pain.  LHC (8/19) showed no significant coronary disease. No recent chest pain.  - Continue ASA 81.  - Not on statin with LDL 62 in 8/23.  3. HTN: Mildly elevated today, but generally well-controlled on current regimen. 4. OHS/OSA: Continue oxygen  2L during day, now wearing CPAP.  5. DM2: Per PCP.  - Adding Farxiga . Will touch base with PCP. 6. Obesity: There is no height or weight on file to calculate BMI. - Discussed weight loss. - Consider referral to pharmacy for semaglutide.   Follow up in 3 months with Dr. Mitzie Anda.  Arlice Bene [facility] FNP-BC 03/27/2024

## 2024-03-28 ENCOUNTER — Encounter (HOSPITAL_COMMUNITY)

## 2024-03-28 ENCOUNTER — Other Ambulatory Visit: Payer: Self-pay | Admitting: Urology

## 2024-03-28 DIAGNOSIS — N3941 Urge incontinence: Secondary | ICD-10-CM

## 2024-03-28 DIAGNOSIS — N3281 Overactive bladder: Secondary | ICD-10-CM

## 2024-03-28 DIAGNOSIS — R3915 Urgency of urination: Secondary | ICD-10-CM

## 2024-03-31 ENCOUNTER — Encounter: Payer: Self-pay | Admitting: Urology

## 2024-03-31 ENCOUNTER — Ambulatory Visit: Admitting: Urology

## 2024-03-31 VITALS — BP 134/78 | HR 74

## 2024-03-31 DIAGNOSIS — N200 Calculus of kidney: Secondary | ICD-10-CM | POA: Diagnosis not present

## 2024-03-31 DIAGNOSIS — N3941 Urge incontinence: Secondary | ICD-10-CM | POA: Diagnosis not present

## 2024-03-31 DIAGNOSIS — N529 Male erectile dysfunction, unspecified: Secondary | ICD-10-CM | POA: Diagnosis not present

## 2024-03-31 DIAGNOSIS — N3281 Overactive bladder: Secondary | ICD-10-CM

## 2024-03-31 DIAGNOSIS — R338 Other retention of urine: Secondary | ICD-10-CM | POA: Diagnosis not present

## 2024-03-31 DIAGNOSIS — N35919 Unspecified urethral stricture, male, unspecified site: Secondary | ICD-10-CM | POA: Diagnosis not present

## 2024-03-31 DIAGNOSIS — R3915 Urgency of urination: Secondary | ICD-10-CM

## 2024-03-31 DIAGNOSIS — R262 Difficulty in walking, not elsewhere classified: Secondary | ICD-10-CM

## 2024-03-31 DIAGNOSIS — R3916 Straining to void: Secondary | ICD-10-CM

## 2024-03-31 DIAGNOSIS — R339 Retention of urine, unspecified: Secondary | ICD-10-CM

## 2024-03-31 DIAGNOSIS — N401 Enlarged prostate with lower urinary tract symptoms: Secondary | ICD-10-CM | POA: Diagnosis not present

## 2024-03-31 DIAGNOSIS — R350 Frequency of micturition: Secondary | ICD-10-CM

## 2024-03-31 DIAGNOSIS — R3912 Poor urinary stream: Secondary | ICD-10-CM | POA: Diagnosis not present

## 2024-03-31 LAB — URINALYSIS, ROUTINE W REFLEX MICROSCOPIC
Bilirubin, UA: NEGATIVE
Ketones, UA: NEGATIVE
Leukocytes,UA: NEGATIVE
Nitrite, UA: NEGATIVE
Protein,UA: NEGATIVE
RBC, UA: NEGATIVE
Specific Gravity, UA: 1.01 (ref 1.005–1.030)
Urobilinogen, Ur: 0.2 mg/dL (ref 0.2–1.0)
pH, UA: 5.5 (ref 5.0–7.5)

## 2024-03-31 LAB — BLADDER SCAN AMB NON-IMAGING: Scan Result: 191

## 2024-03-31 MED ORDER — TAMSULOSIN HCL 0.4 MG PO CAPS: 0.4000 mg | ORAL_CAPSULE | Freq: Two times a day (BID) | ORAL | 3 refills | Status: AC

## 2024-03-31 NOTE — Progress Notes (Signed)
 Name: Colton Jackson. DOB: [birth year] MRN: [id number]  History of Present Illness: Colton Jackson is a 73 y.o. male who presents today for follow up visit at [facility] [place].  GU History includes: 1. BPH with LUTS (weak stream, frequency, nocturia, urgency, incomplete bladder emptying). - Taking Flomax  (Tamsulosin ) 0.4 mg daily. 2. OAB with urge incontinence. - Exacerbating factors include: diuretic use, glucosuria (due to Farxiga  use), OSA.  - Previous treatments: PTNS, Gemtesa  (ineffective & cost prohibitive, dual therapy with Trospium  + Gemtesa  (helped but worsened LE edema).  - Taking Sanctura  (Trospium ) 20 mg 2x/day. 3. Urethral stricture. - Mild panurethral stricture disease noted on cystoscopy by Dr. Inga Manges on 08/12/2023. 4. Prior microhematuria.  - Negative workup including cystoscopy (08/12/2023) and CT abdomen/pelvis w/ contrast (06/03/2022). 5. Kidney stones.  - 06/03/2022: CT showed 2 punctate left kidney stones. 6. Erectile dysfunction.  - Previously took Sildenafil PRN.  At last visit with Dr. Inga Manges on 08/12/2023: - OAB symptoms improved on Flomax  and Trospium . - Cystoscopy revealed mild panurethral stricture disease, short prostate with minimal hyperplasia but some bladder neck elevation, mild bladder trabeculation. - The plan was to continue Flomax  and Trospium  with follow up in 6 months.   Today: He reports bothersome urinary frequency x6-7, nocturia x2-3, and urgency. Reports urge incontinence episodes primarily at night but occasionally during the day if he has to delay voiding. Wearing diapers. Denies significant caffeine intake.  Reports urinary hesitancy and some straining to void. Denies dysuria, gross hematuria, sensations of incomplete emptying, suprapubic pain. He's unsure if having flank pain versus his chronic back pain.   Medications: Current Outpatient Medications  Medication Sig Dispense Refill   acetaminophen  (TYLENOL ) 500 MG tablet Take 500  mg by mouth 3 (three) times daily as needed (pain.).     azelastine  (ASTELIN ) 0.1 % nasal spray 1 puff each nostril twice daily as needed 30 mL 12   Cholecalciferol (VITAMIN D3) 10 MCG (400 UNIT) CAPS Take 1 capsule by mouth daily.     cromolyn  (NASALCROM ) 5.2 MG/ACT nasal spray Place 1 spray into both nostrils 3 (three) times daily as needed for allergies.      dapagliflozin  propanediol (FARXIGA ) 10 MG TABS tablet Take 1 tablet (10 mg total) by mouth daily before breakfast. 30 tablet 8   famotidine  (PEPCID ) 20 MG tablet Take 20 mg by mouth 2 (two) times daily.      furosemide  (LASIX ) 20 MG tablet Take 1 tablet (20 mg total) by mouth daily. Please have patient call for office visit [phone] 30 tablet 0   glipiZIDE  (GLUCOTROL ) 10 MG tablet Take 10 mg by mouth 2 (two) times daily.      HYDROcodone -acetaminophen  (NORCO) 10-325 MG tablet Take 1 tablet by mouth 3 (three) times daily as needed for moderate pain.      Krill Oil 500 MG CAPS Take 500 mg by mouth daily.      latanoprost  (XALATAN ) 0.005 % ophthalmic solution INSTILL 1 DROP IN BOTH EYES EVERY DAY AT BEDTIME 7.5 mL 12   losartan  (COZAAR ) 50 MG tablet Take 0.5 tablets (25 mg total) by mouth daily. 45 tablet 3   meclizine  (ANTIVERT ) 25 MG tablet Take 25 mg by mouth 3 (three) times daily as needed for dizziness or nausea (when taking hydrocodone /apap).      meloxicam (MOBIC) 7.5 MG tablet Take 7.5 mg by mouth 2 (two) times daily.     metFORMIN  (GLUCOPHAGE -XR) 500 MG 24 hr tablet Take 1,000 mg by mouth 2 (two)  times daily.     metoprolol  succinate (TOPROL -XL) 50 MG 24 hr tablet Take 1 tablet (50 mg total) by mouth daily. 30 tablet 3   OXYGEN  Inhale 5 L/hr into the lungs daily. Pt on 6 liters at home     psyllium (METAMUCIL) 58.6 % powder Take 1 packet by mouth 2 (two) times daily.      spironolactone  (ALDACTONE ) 25 MG tablet TAKE 1 TABLET(25 MG) BY MOUTH DAILY 90 tablet 3   trospium  (SANCTURA ) 20 MG tablet TAKE 1 TABLET(20 MG) BY MOUTH TWICE  DAILY 180 tablet 3   LEVEMIR  FLEXPEN 100 UNIT/ML FlexPen Inject 25-50 Units into the skin daily as needed (blood sugar greater than 130.). (Patient not taking: Reported on 03/31/2024)     tamsulosin  (FLOMAX ) 0.4 MG CAPS capsule Take 1 capsule (0.4 mg total) by mouth in the morning and at bedtime. 180 capsule 3   No current facility-administered medications for this visit.    Allergies: Allergies  Allergen Reactions   Covid-19 Mrna Vaccine [employer]) [Covid-19 Mrna Vacc (Moderna)] Other (See Comments)    Joint pain and fatigue   Sulfa Antibiotics Swelling    eyelids    Past Medical History:  Diagnosis Date   Allergic rhinitis    Aneurysm artery, neck (HCC)    Dr. Linder Revere pulmonologist   Degenerative disk disease    Essential hypertension    GERD (gastroesophageal reflux disease)    H/O cardiovascular stress test 02/02/11   EF 59% - Normal stress test   Hearing deficit, right    Knee pain, bilateral    Lower back pain    Morbid obesity (HCC)    OSA (obstructive sleep apnea)    On CPAP   Thyroid  nodule    Type 2 diabetes mellitus (HCC)    Type 2 diabetes mellitus without complication (HCC) 06/26/2008   Qualifier: Diagnosis of  By: Linder Revere MD, Clinton D    Past Surgical History:  Procedure Laterality Date   APPENDECTOMY     CARDIAC CATHETERIZATION     Cataract surgery Bilateral    COLONOSCOPY WITH PROPOFOL  N/A 08/01/2019   Procedure: COLONOSCOPY WITH PROPOFOL ;  Surgeon: Alanda Allegra, MD;  Location: [facility];  Service: General;  Laterality: N/A;   KNEE ARTHROSCOPY Bilateral    NASAL SEPTOPLASTY W/ TURBINOPLASTY     RIGHT HEART CATH N/A 07/13/2022   Procedure: RIGHT HEART CATH;  Surgeon: Darlis Eisenmenger, MD;  Location: [facility] INVASIVE CV LAB;  Service: Cardiovascular;  Laterality: N/A;   RIGHT/LEFT HEART CATH AND CORONARY ANGIOGRAPHY N/A 06/23/2018   Procedure: RIGHT/LEFT HEART CATH AND CORONARY ANGIOGRAPHY;  Surgeon: Darlis Eisenmenger, MD;  Location: [facility] INVASIVE CV LAB;  Service:  Cardiovascular;  Laterality: N/A;   Family History  Problem Relation Age of Onset   Lung disease Father    Stroke Father 73   Diabetes Father    Diabetes Maternal Grandfather    Heart disease Paternal Grandmother    Stroke Paternal Grandfather 82   Diabetes Paternal Grandfather    Diabetes Mother    Cancer Mother    Stroke Mother 26   Diabetes Sister    Diabetes Brother 71   Social History   Socioeconomic History   Marital status: Widowed    Spouse name: Not on file   Number of children: Not on file   Years of education: Not on file   Highest education level: Not on file  Occupational History   Occupation: former Psychologist, occupational, tow truck service  Tobacco  Use   Smoking status: Former    Current packs/day: 0.00    Types: Cigarettes    Quit date: 1971    Years since quitting: 54.4   Smokeless tobacco: Never   Tobacco comments:    smoked when he was a teenager  Vaping Use   Vaping status: Never Used  Substance and Sexual Activity   Alcohol use: No   Drug use: No   Sexual activity: Not Currently  Other Topics Concern   Not on file  Social History Narrative   Not on file   Social Drivers of Health   Financial Resource Strain: Not on file  Food Insecurity: Not on file  Transportation Needs: Not on file  Physical Activity: Not on file  Stress: Not on file  Social Connections: Not on file  Intimate Partner Violence: Not on file    Review of Systems Constitutional: Patient denies any unintentional weight loss or change in strength lntegumentary: Patient denies any rashes or pruritus Cardiovascular: Patient denies chest pain or syncope Respiratory: Patient denies shortness of breath Gastrointestinal: Patient denies constipation Musculoskeletal: Patient denies muscle cramps or weakness Neurologic: Patient denies convulsions or seizures Allergic/Immunologic: Patient denies recent allergic reaction(s) Hematologic/Lymphatic: Patient denies bleeding tendencies Endocrine:  Patient denies heat/cold intolerance  GU: As per HPI.  OBJECTIVE Vitals:   03/31/24 1143  BP: 134/78  Pulse: 74   There is no height or weight on file to calculate BMI.  Physical Examination Constitutional: No obvious distress; patient is non-toxic appearing  Cardiovascular: No visible lower extremity edema.  Respiratory: The patient does not have audible wheezing/stridor; respirations do not appear labored  Gastrointestinal: Abdomen non-distended Musculoskeletal: Normal ROM of UEs  Skin: No obvious rashes/open sores  Neurologic: CN 2-12 grossly intact Psychiatric: Answered questions appropriately with normal affect  Hematologic/Lymphatic/Immunologic: No obvious bruises or sites of spontaneous bleeding  UA: glucosuria (secondary to Farxiga  use), otherwise unremarkable  PVR: 191 ml  ASSESSMENT OAB (overactive bladder) - Plan: Urinalysis, Routine w reflex microscopic, BLADDER SCAN AMB NON-IMAGING  Urge incontinence - Plan: BLADDER SCAN AMB NON-IMAGING, tamsulosin  (FLOMAX ) 0.4 MG CAPS capsule  Urinary urgency - Plan: BLADDER SCAN AMB NON-IMAGING, tamsulosin  (FLOMAX ) 0.4 MG CAPS capsule  Erectile dysfunction, unspecified erectile dysfunction type - Plan: BLADDER SCAN AMB NON-IMAGING  Benign prostatic hyperplasia with weak urinary stream - Plan: BLADDER SCAN AMB NON-IMAGING, tamsulosin  (FLOMAX ) 0.4 MG CAPS capsule, US  RENAL  Kidney stones - Plan: BLADDER SCAN AMB NON-IMAGING, US  RENAL  Stricture of male urethra, unspecified stricture type - Plan: US  RENAL  Urinary frequency - Plan: tamsulosin  (FLOMAX ) 0.4 MG CAPS capsule  Straining to void - Plan: tamsulosin  (FLOMAX ) 0.4 MG CAPS capsule, US  RENAL  Ambulatory dysfunction  Incomplete bladder emptying - Plan: US  RENAL  We discussed finding of incomplete bladder emptying with elevated PVR today. He has several possible contributing factors for incomplete bladder emptying Trospium  use, including BPH, urethral stricture, and  neurogenic risk factors (T2DM, degenerative disc disease, distant history of nicotine use). Possible neurogenic bladder.  Advised to increase Flomax  (Tamsulosin ) to 0.4 mg twice daily along with behavioral modifications including minimizing / avoiding caffeine intake and working on timed voiding.   Also advised RUS for surveillance of kidney stones and to rule out hydronephrosis secondary to incomplete bladder emptying.   Will plan for follow up with PVR recheck in 3-4 weeks. If PVR still elevated, will consider switching from Flomax  to Rapaflo and/or discontinuation of Trospium .  Pt verbalized understanding and agreement. All questions were answered.  PLAN Advised the following: 1. RUS within 1-2 weeks 2. Increase Flomax  (Tamsulosin ) 0.4 mg twice per day. 3. Continue Sanctura  (Trospium ) 20 mg twice per day.  4. Minimize / avoid caffeine intake. 5. Work on timed voiding / bladder retraining. 6. Return in about 3 weeks (around 04/21/2024) for UA, PVR, & f/u with Griselda Lederer NP .  Orders Placed This Encounter  Procedures   US  RENAL    Standing Status:   Future    Expected Date:   03/31/2024    Expiration Date:   03/31/2025    Reason for Exam (SYMPTOM  OR DIAGNOSIS REQUIRED):   kidney stone known or suspected    Preferred imaging location?:   [facility]   Urinalysis, Routine w reflex microscopic   BLADDER SCAN AMB NON-IMAGING    It has been explained that the patient is to follow regularly with their PCP in addition to all other providers involved in their care and to follow instructions provided by these respective offices. Patient advised to contact urology clinic if any urologic-pertaining questions, concerns, new symptoms or problems arise in the interim period.  Patient Instructions  Overactive bladder (OAB) overview for patients:  Symptoms may include: urinary urgency ("gotta go" feeling) urinary frequency (voiding >8 times per day) night time urination  (nocturia) urge incontinence of urine (UUI)  While we do not know the exact etiology of OAB, several treatment options exist including:  Behavioral therapy: Reducing fluid intake Decreasing bladder stimulants (such as caffeine) and irritants (such as acidic food, spicy foods, alcohol) Urge suppression strategies Bladder retraining via timed voiding  Pelvic floor physical therapy  Medication(s) - can use one or both of the drug classes below. Anticholinergic / antimuscarinic medications:  Mechanism of action: Activate M3 receptors to reduce detrusor stimulation and increase bladder capacity  (parasympathetic nervous system). Effect: Relaxes the bladder to decrease overactivity, increase bladder storage capacity, and increase time between voids. Onset: Slow acting (may take 8-12 weeks to determine efficacy). Medications include: Vesicare (Solifenacin), Ditropan (Oxybutynin), Detrol (Tolterodine), Toviaz (Fesoterodine), Sanctura  (Trospium ), Urispas (Flavoxate), Enablex  (Darifenacin ), Bentyl (Dicyclomine), Levsin (Hyoscyamine ). Potential side effects include but are not limited to: Dry eyes, dry mouth, constipation, cognitive impairment, dementia risk with long term use, and urinary retention/ incomplete bladder emptying. Insurance companies generally prefer for patients to try 1-2 anticholinergic / antimuscarinic medications first due to low cost. Some exceptions are made based on patient-specific comorbidities / risk factors. Beta-3 agonist medications: Mechanism of action: Stimulates selective B3 adrenergic receptors to cause smooth muscle bladder relaxation (sympathetic nervous system). Effect: Relaxes the bladder to decrease overactivity, increase bladder storage capacity, and increase time between voids. Onset: Slow acting (may take 8-12 weeks to determine efficacy). Medications include: Myrbetriq  (Mirabegron ) and Vibegron  (Gemtesa ). Potential side effects include but are not limited  to: urinary retention / incomplete bladder emptying and elevated blood pressure (more likely to occur in individuals with pre-existing uncontrolled hypertension). These medications tend to be more expensive than the anticholinergic / antimuscarinic medications.   For patients with refractory OAB (if the above treatment options have been unsuccessful): Posterior tibial nerve stimulation (PTNS). Small acupuncture-type needle inserted near ankle with electric current to stimulate bladder via posterior tibial nerve pathway. Initially requires 12 weekly in-office treatments lasting 30 minutes each; followed by monthly in-office treatments lasting 30 minutes each for 1 year.  Bladder Botox injections. How it is done: Typically done via in-office cystoscopy; sometimes done in the OR depending on the situation. The bladder is numbed  with lidocaine  instilled via a catheter. Then the urologist injects Botox into the bladder muscle wall in about 20 locations. Causes local paralysis of the bladder muscle at the injection sites to reduce bladder muscle overactivity / spasms. The effect lasts for approximately 6 months and cannot be reversed once performed. Risks may included but are not limited to: infection, incomplete bladder emptying/ urinary retention, short term need for self-catheterization or indwelling catheter, and need for repeat therapy. There is a 5-12% chance of needing to catheterize with Botox - that usually resolves in a few months as the Botox wears off. Typically Botox injections would need to be repeated every 3-12 months since this is not a permanent therapy.  Sacral neuromodulation trial (Medtronic lnterStim or Axonics implant). Sacral neuromodulation is FDA-approved for uncontrolled urinary urgency, urinary frequency, urinary urge incontinence, non-obstructive urinary retention, or fecal incontinence. It is not FDA-approved as a treatment for pain. The goal of this therapy is at least a 50%  improvement in symptoms. It is NOT realistic to expect a 100% cure. This is a a 2-step outpatient procedure. After a successful test period, a permanent wire and generator are placed in the OR. We discussed the risk of infection. We reviewed the fact that about 30% of patients fail the test phase and are not candidates for permanent generator placement. During the 1-2 week trial phase, symptoms are documented by the patient to determine response. If patient gets at least a 50% improvement in symptoms, they may then proceed with Step 2. Step 1: Trial lead placement. Per physician discretion, may done one of two ways: Percutaneous nerve evaluation (PNE) in the [facility] urology office. Performed by urologist under local anesthesia (numbing the area with lidocaine ) using a spinal needle for placement of test wire, which usually stays in place for 5-7 days to determine therapy response. Test lead placement in OR under anesthesia. Usually stays in place 2 weeks to determine therapy response. > Step 2: Permanent implantation of sacral neuromodulation device, which is performed in the OR.  Sacral neuromodulation implants: All are conditionally MRI safe. Manufacturer: Medtronic Website: [employer] therapy/right-for-you.html Options: lnterStim X: Non-rechargeable. The battery lasts 10 years on average. lnterStim Micro: Rechargeable. The battery lasts 15 years on average and must be charged routinely. Approximately 50% smaller implant than lnterStim X implant.  Manufacturer: Axonics Website: Findrealrelief.axonics.com Options: Non-rechargeable (Axonics F15): The battery lasts 15 years on average. Rechargeable (Axonics R20): The battery lasts 20 years on average and must be charged in office for about 1 hour every 6-10 months on average. Approximately 50% smaller implant than Axonics non-rechargeable implant.  Note: Generally  the rechargeable devices are only advised for very small or thin patients who may not have sufficient adipose tissue to comfortably overlay the implanted device.  Suprapubic catheter (SP tube) placement. Only done in severely refractory OAB when all other options have failed or are not a viable treatment choice depending on patient factors. Involves placement of a catheter through the lower abdomen into the bladder to continuously drain the bladder into an external collection bag, which patient can then empty at their convenience every few hours. Done via an outpatient surgical procedure in the OR under anesthesia. Risks may included but are not limited to: surgical site pain, infections, skin irritation / breakdown, chronic bacteriuria, symptomatic UTls. The SP tube must stay in place continuously. This is a reversible procedure however - the insertion site will close if catheter is removed for more than a few hours. The SP tube  must be exchanged routinely every 4 weeks to prevent the catheter from becoming clogged with sediment. SP tube exchanges are typically performed at a urology nurse visit or by a home health nurse.   Electronically signed by:  Lauretta Ponto, FNP   03/31/24    1:29 PM

## 2024-03-31 NOTE — Patient Instructions (Signed)
 Overactive bladder (OAB) overview for patients:  Symptoms may include: urinary urgency ("gotta go" feeling) urinary frequency (voiding >8 times per day) night time urination (nocturia) urge incontinence of urine (UUI)  While we do not know the exact etiology of OAB, several treatment options exist including:  Behavioral therapy: Reducing fluid intake Decreasing bladder stimulants (such as caffeine) and irritants (such as acidic food, spicy foods, alcohol) Urge suppression strategies Bladder retraining via timed voiding  Pelvic floor physical therapy  Medication(s) - can use one or both of the drug classes below. Anticholinergic / antimuscarinic medications:  Mechanism of action: Activate M3 receptors to reduce detrusor stimulation and increase bladder capacity  (parasympathetic nervous system). Effect: Relaxes the bladder to decrease overactivity, increase bladder storage capacity, and increase time between voids. Onset: Slow acting (may take 8-12 weeks to determine efficacy). Medications include: Vesicare (Solifenacin), Ditropan (Oxybutynin), Detrol (Tolterodine), Toviaz (Fesoterodine), Sanctura (Trospium), Urispas (Flavoxate), Enablex (Darifenacin), Bentyl (Dicyclomine), Levsin (Hyoscyamine ). Potential side effects include but are not limited to: Dry eyes, dry mouth, constipation, cognitive impairment, dementia risk with long term use, and urinary retention/ incomplete bladder emptying. Insurance companies generally prefer for patients to try 1-2 anticholinergic / antimuscarinic medications first due to low cost. Some exceptions are made based on patient-specific comorbidities / risk factors. Beta-3 agonist medications: Mechanism of action: Stimulates selective B3 adrenergic receptors to cause smooth muscle bladder relaxation (sympathetic nervous system). Effect: Relaxes the bladder to decrease overactivity, increase bladder storage capacity, and increase time  between voids. Onset: Slow acting (may take 8-12 weeks to determine efficacy). Medications include: Myrbetriq (Mirabegron) and Vibegron Leslye Peer). Potential side effects include but are not limited to: urinary retention / incomplete bladder emptying and elevated blood pressure (more likely to occur in individuals with pre-existing uncontrolled hypertension). These medications tend to be more expensive than the anticholinergic / antimuscarinic medications.   For patients with refractory OAB (if the above treatment options have been unsuccessful): Posterior tibial nerve stimulation (PTNS). Small acupuncture-type needle inserted near ankle with electric current to stimulate bladder via posterior tibial nerve pathway. Initially requires 12 weekly in-office treatments lasting 30 minutes each; followed by monthly in-office treatments lasting 30 minutes each for 1 year.  Bladder Botox injections. How it is done: Typically done via in-office cystoscopy; sometimes done in the OR depending on the situation. The bladder is numbed with lidocaine instilled via a catheter. Then the urologist injects Botox into the bladder muscle wall in about 20 locations. Causes local paralysis of the bladder muscle at the injection sites to reduce bladder muscle overactivity / spasms. The effect lasts for approximately 6 months and cannot be reversed once performed. Risks may included but are not limited to: infection, incomplete bladder emptying/ urinary retention, short term need for self-catheterization or indwelling catheter, and need for repeat therapy. There is a 5-12% chance of needing to catheterize with Botox - that usually resolves in a few months as the Botox wears off. Typically Botox injections would need to be repeated every 3-12 months since this is not a permanent therapy.  Sacral neuromodulation trial (Medtronic lnterStim or Axonics implant). Sacral neuromodulation is FDA-approved for uncontrolled urinary  urgency, urinary frequency, urinary urge incontinence, non-obstructive urinary retention, or fecal incontinence. It is not FDA-approved as a treatment for pain. The goal of this therapy is at least a 50% improvement in symptoms. It is NOT realistic to expect a 100% cure. This is a a 2-step outpatient procedure. After a successful  test period, a permanent wire and generator are placed in the OR. We discussed the risk of infection. We reviewed the fact that about 30% of patients fail the test phase and are not candidates for permanent generator placement. During the 1-2 week trial phase, symptoms are documented by the patient to determine response. If patient gets at least a 50% improvement in symptoms, they may then proceed with Step 2. Step 1: Trial lead placement. Per physician discretion, may done one of two ways: Percutaneous nerve evaluation (PNE) in the [facility] urology office. Performed by urologist under local anesthesia (numbing the area with lidocaine) using a spinal needle for placement of test wire, which usually stays in place for 5-7 days to determine therapy response. Test lead placement in OR under anesthesia. Usually stays in place 2 weeks to determine therapy response. > Step 2: Permanent implantation of sacral neuromodulation device, which is performed in the OR.  Sacral neuromodulation implants: All are conditionally MRI safe. Manufacturer: Medtronic Website: [employer] therapy/right-for-you.html Options: lnterStim X: Non-rechargeable. The battery lasts 10 years on average. lnterStim Micro: Rechargeable. The battery lasts 15 years on average and must be charged routinely. Approximately 50% smaller implant than lnterStim X implant.  Manufacturer: Axonics Website: Findrealrelief.axonics.com Options: Non-rechargeable (Axonics F15): The battery lasts 15 years on average. Rechargeable (Axonics R20):  The battery lasts 20 years on average and must be charged in office for about 1 hour every 6-10 months on average. Approximately 50% smaller implant than Axonics non-rechargeable implant.  Note: Generally the rechargeable devices are only advised for very small or thin patients who may not have sufficient adipose tissue to comfortably overlay the implanted device.  Suprapubic catheter (SP tube) placement. Only done in severely refractory OAB when all other options have failed or are not a viable treatment choice depending on patient factors. Involves placement of a catheter through the lower abdomen into the bladder to continuously drain the bladder into an external collection bag, which patient can then empty at their convenience every few hours. Done via an outpatient surgical procedure in the OR under anesthesia. Risks may included but are not limited to: surgical site pain, infections, skin irritation / breakdown, chronic bacteriuria, symptomatic UTls. The SP tube must stay in place continuously. This is a reversible procedure however - the insertion site will close if catheter is removed for more than a few hours. The SP tube must be exchanged routinely every 4 weeks to prevent the catheter from becoming clogged with sediment. SP tube exchanges are typically performed at a urology nurse visit or by a home health nurse.

## 2024-04-13 ENCOUNTER — Ambulatory Visit (HOSPITAL_COMMUNITY)
Admission: RE | Admit: 2024-04-13 | Discharge: 2024-04-13 | Disposition: A | Discharge status: Home / Self Care | Source: Ambulatory Visit | Attending: Urology | Admitting: Urology

## 2024-04-13 DIAGNOSIS — N35919 Unspecified urethral stricture, male, unspecified site: Secondary | ICD-10-CM | POA: Insufficient documentation

## 2024-04-13 DIAGNOSIS — N401 Enlarged prostate with lower urinary tract symptoms: Secondary | ICD-10-CM | POA: Insufficient documentation

## 2024-04-13 DIAGNOSIS — N200 Calculus of kidney: Secondary | ICD-10-CM | POA: Insufficient documentation

## 2024-04-13 DIAGNOSIS — R339 Retention of urine, unspecified: Secondary | ICD-10-CM | POA: Diagnosis not present

## 2024-04-13 DIAGNOSIS — R3912 Poor urinary stream: Secondary | ICD-10-CM | POA: Diagnosis not present

## 2024-04-13 DIAGNOSIS — R3916 Straining to void: Secondary | ICD-10-CM | POA: Insufficient documentation

## 2024-04-17 ENCOUNTER — Encounter (HOSPITAL_COMMUNITY)

## 2024-04-17 DIAGNOSIS — G894 Chronic pain syndrome: Secondary | ICD-10-CM | POA: Diagnosis not present

## 2024-04-17 DIAGNOSIS — E785 Hyperlipidemia, unspecified: Secondary | ICD-10-CM | POA: Diagnosis not present

## 2024-04-17 DIAGNOSIS — E113492 Type 2 diabetes mellitus with severe nonproliferative diabetic retinopathy without macular edema, left eye: Secondary | ICD-10-CM | POA: Diagnosis not present

## 2024-04-17 DIAGNOSIS — Z6841 Body Mass Index (BMI) 40.0 and over, adult: Secondary | ICD-10-CM | POA: Diagnosis not present

## 2024-04-17 DIAGNOSIS — I10 Essential (primary) hypertension: Secondary | ICD-10-CM | POA: Diagnosis not present

## 2024-04-17 DIAGNOSIS — E538 Deficiency of other specified B group vitamins: Secondary | ICD-10-CM | POA: Diagnosis not present

## 2024-04-17 DIAGNOSIS — Z1331 Encounter for screening for depression: Secondary | ICD-10-CM | POA: Diagnosis not present

## 2024-04-17 DIAGNOSIS — Z794 Long term (current) use of insulin: Secondary | ICD-10-CM | POA: Diagnosis not present

## 2024-04-17 DIAGNOSIS — E6609 Other obesity due to excess calories: Secondary | ICD-10-CM | POA: Diagnosis not present

## 2024-04-17 DIAGNOSIS — Z0001 Encounter for general adult medical examination with abnormal findings: Secondary | ICD-10-CM | POA: Diagnosis not present

## 2024-04-17 DIAGNOSIS — I110 Hypertensive heart disease with heart failure: Secondary | ICD-10-CM | POA: Diagnosis not present

## 2024-04-17 DIAGNOSIS — Z9981 Dependence on supplemental oxygen: Secondary | ICD-10-CM | POA: Diagnosis not present

## 2024-04-18 NOTE — Progress Notes (Signed)
 PCP: Dr. Glady Laming Cardiology: Dr. Londa Rival HF Cardiology: Dr. Mitzie Anda  73 y.o. with history of OHS/OSA, chronic bronchitis, and suspected pulmonary hypertension was referred by Dr. Londa Rival for evaluation of pulmonary hypertension. Patient is on 2L home oxygen  and CPAP. He has had gradually worsening exertional dyspnea over the last year.  He was able to mow his grass last fall but cannot now.  He is short of breath after walking about 50-60 feet with his oxygen  on.  He is short of breath with any incline.   Echo in 3/19 showed normal EF with PASP 72 mmHg.  I had him do left/right heart cath in 8/19.  This showed no CAD and moderate mixed pulmonary venous/pulmonary arterial hypertension.  Very mildly elevated filling pressures.    He is followed by pulmonary.  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 V/Q scan in 9/19 did not show evidence for chronic PE.   Admitted 7/23 with SBO, treated medically. Has a/c respiratory failure and cardiology consulted. Echo showed EF 70-75%, hyperdynamic EF with moderate LVH, RV normal systolic function, severely elevated pulmonary artery systolic pressures 77.2 mm Hg. Discharged home on Lasix  40 mg, weight 273 lbs. Advised HF follow up to discuss RHC.  RHC 8/23 showed near normal filling pressures, severe pulmonary arterial hypertension, concern for group 3 PH with OHS/OSA, but cannot totally rule out group 1 component, and preserved cardiac output. Recommend continue management of OSA/OHS with oxygen /CPAP.  Follow up 12/23, Farxiga  started but creatinine jumped 1.23-->2.41. Lasix  and losartan  held x 2 days, then resumed at lower doses. Farxiga  stopped.  Today he returns for [facility] follow up. Overall feeling fine. Feeling a little weaker since last visit, he attributes this in part to neuropathy and blood sugar swings. Occasional dizziness, no falls. Uses his rolling walker when outside of  house. Wears CPAP at night and 2L oxygen  continuously. He is not SOB walking with walker on flat ground. Compression hose keeps swelling down. Denies palpitations, abnormal bleeding, CP, or PND/Orthopnea. Appetite ok. Weight at home 290 pounds. Taking all medications. Started going to [employer] but blood sugar started dropping and he had to stop. BP at home ~ 130s/70s.  ReDs reading: 35 %, normal  ECG (personally reviewed): NSR 69 bpm  Labs (8/23): K 3.8, creatinine 1.12, BNP 46, LDL 62, TGs 111 Labs (12/23): K 3.8, creatinine 2.05 Labs (1/24): K 4.5, creatinine 1.64 Labs (4/24): K 4.9, creatinine 2.07  PMH: 1. OHS/OSA: Uses 2L home oxygen  and CPAP.  2. Obesity 3. HTN 4. Type II diabetes 5. Chronic bronchitis: ?from welding.  Never smoked.    - CT chest (3/19): coronary calcification, mild emphysema, calcified mediastinal and hilar nodes consistent with prior granulomatous infection.  - PFTs (12/17): FVC 56%, FEV1 57%, ratio 101%, SVC 109%, DLCO 66%.  Moderate obstruction/severe restriction.  6. CAD: Coronary calcification on CT chest 3/19.   - Cardiolite in 2012 showed no ischemia.  - LHC (8/19): No significant CAD.  7. Pulmonary hypertension: Echo (3/19) with EF 65-70%, moderate LVH, PASP 72 mmHg.  - RHC (8/19): mean RA 8, PA 67/24 mean 43, mean PCWP 17, CI 2.99, PVR 3.5 WU.  - V/Q scan (9/19): No evidence for chronic PE.  - Echo (7/23): EF 70-75%, hyperdynamic EF with moderate LVH, RV normal systolic function, PASP 77.2 mmHg - RHC (8/23): mean RA 9, PA 75/27 mean 45, mean PCWP 13, CI 2.99, PVR 4.6 WU.  8. 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 Lives in [place].   Family History  Problem Relation Age of Onset   Lung disease Father    Stroke Father 62   Diabetes Father    Diabetes Maternal Grandfather    Heart disease Paternal Grandmother    Stroke Paternal Grandfather 85   Diabetes Paternal Grandfather    Diabetes Mother     Cancer Mother    Stroke Mother 48   Diabetes Sister    Diabetes Brother 36   ROS: All systems reviewed and negative except as per HPI.   Current Outpatient Medications  Medication Sig Dispense Refill   acetaminophen  (TYLENOL ) 500 MG tablet Take 500 mg by mouth 3 (three) times daily as needed (pain.).     azelastine  (ASTELIN ) 0.1 % nasal spray 1 puff each nostril twice daily as needed 30 mL 12   Cholecalciferol (VITAMIN D3) 10 MCG (400 UNIT) CAPS Take 1 capsule by mouth daily.     cromolyn  (NASALCROM ) 5.2 MG/ACT nasal spray Place 1 spray into both nostrils 3 (three) times daily as needed for allergies.      dapagliflozin  propanediol (FARXIGA ) 10 MG TABS tablet Take 1 tablet (10 mg total) by mouth daily before breakfast. 30 tablet 8   famotidine  (PEPCID ) 20 MG tablet Take 20 mg by mouth 2 (two) times daily.      furosemide  (LASIX ) 20 MG tablet Take 1 tablet (20 mg total) by mouth daily. Please have patient call for office visit [phone] 30 tablet 0   glipiZIDE  (GLUCOTROL ) 10 MG tablet Take 10 mg by mouth 2 (two) times daily.      HYDROcodone -acetaminophen  (NORCO) 10-325 MG tablet Take 1 tablet by mouth 3 (three) times daily as needed for moderate pain.      latanoprost  (XALATAN ) 0.005 % ophthalmic solution INSTILL 1 DROP IN BOTH EYES EVERY DAY AT BEDTIME 7.5 mL 12   losartan  (COZAAR ) 50 MG tablet Take 0.5 tablets (25 mg total) by mouth daily. 45 tablet 3   meclizine  (ANTIVERT ) 25 MG tablet Take 25 mg by mouth 3 (three) times daily as needed for dizziness or nausea (when taking hydrocodone /apap).      meloxicam (MOBIC) 7.5 MG tablet Take 7.5 mg by mouth 2 (two) times daily.     metFORMIN  (GLUCOPHAGE -XR) 500 MG 24 hr tablet Take 1,000 mg by mouth 2 (two) times daily.     metoprolol  succinate (TOPROL -XL) 50 MG 24 hr tablet Take 1 tablet (50 mg total) by mouth daily. 30 tablet 3   OXYGEN  Inhale 5-6 L/hr into the lungs daily. Pt on 6 liters at home     psyllium (METAMUCIL) 58.6 % powder Take  1 packet by mouth 2 (two) times daily.      spironolactone  (ALDACTONE ) 25 MG tablet TAKE 1 TABLET(25 MG) BY MOUTH DAILY (Patient taking differently: Take 12.5 mg by mouth daily.) 90 tablet 3   tamsulosin  (FLOMAX ) 0.4 MG CAPS capsule Take 1 capsule (0.4 mg total) by mouth in the morning and at bedtime. 180 capsule 3   trospium  (SANCTURA ) 20 MG tablet TAKE 1 TABLET(20 MG) BY MOUTH TWICE DAILY 180 tablet 3   No current facility-administered medications for this encounter.   Wt Readings from Last 3 Encounters:  04/19/24 133.2 kg (293 lb 9.6 oz)  02/24/23 127.9 kg (282 lb)  02/23/23 130.3 kg (287 lb 4.2 oz)   BP (!) 156/90   Pulse 79  Wt 133.2 kg (293 lb 9.6 oz)   SpO2 93% Comment: 2-liters  BMI 44.64 kg/m  Physical Exam General:  NAD. No resp difficulty, walked into clinic with RW on 2L oxygen  HEENT: Normal Neck: Supple. No JVD. Thick neck Cor: Regular rate & rhythm. No rubs, gallops or murmurs. Lungs: Clear Abdomen: Soft, obese, nontender, nondistended.  Extremities: No cyanosis, clubbing, rash, edema Neuro: Alert & oriented x 3, moves all 4 extremities w/o difficulty. Affect pleasant.  Assessment/Plan: 1. Pulmonary hypertension: Echo in 3/19 showed estimated PA systolic pressure 72.  He has chronic bronchitis as well as OHS/OSA.  Interestingly, chest CT showed calcified mediastinal and hilar nodes suggestive of prior granulomatous infection => could this actually represent sarcoidosis?  ACE level is normal, and per his pulmonologist Dr. Linder Revere, sarcoidosis is unlikely. RHC 8/19 showed mild pulmonary hypertension, mildly elevated PCWP.  Suspect there is group 2 PH from mildly elevated PCWP but also group 3 PH from OHS/OSA and parenchymal lung disease.  Unlikely group 1.  PVR only 3.5 WU. Echo (7/23) EF 70-75%, hyperdynamic EF with moderate LVH, RV normal systolic function, severely elevated pulmonary artery systolic pressures 77.2 mm Hg.  RHC (8/23) showed near normal filling pressures,  severe pulmonary arterial hypertension, suspect primarily group 3 PH with OHS/OSA. Would hold off on selective pulmonary vasodilators. NYHA II-early III functional status confounded by physical deconditioning and body habitus. He is not volume overloaded on exam, ReDs 35%. Weight is up 5 lbs - Continue Farxiga  10 mg daily. BMET/BNP today. - Continue Lasix  20 mg daily.  - Continue spironolactone  12.5 mg daily.  - Continue losartan  12.5 mg daily (recently reduced with elevated SCr). - Recommend finishing Pulm Rehab however insurance deductible is a barrier. - Update echo next visit. 2. CAD: Coronary calcification on 3/19 chest CT.  Patient has a history of chest pain.  LHC (8/19) showed no significant coronary disease. No recent chest pain.  - Continue ASA 81.  - Not on statin with LDL 62 in 8/23.  3. HTN: Mildly elevated today, but 130s/70s on home check. - Continue current meds 4. OHS/OSA: Continue oxygen  2L during day, now wearing CPAP.  5. DM2: Per PCP.  - On SGLT2i 6. Obesity: Body mass index is 44.64 kg/m. - Consider referral to pharmacy for semaglutide.   Follow up in 3 months with Dr. Mitzie Anda + echo  Arlice Bene [facility] FNP-BC 04/19/2024

## 2024-04-19 ENCOUNTER — Ambulatory Visit (HOSPITAL_COMMUNITY)
Admission: RE | Admit: 2024-04-19 | Discharge: 2024-04-19 | Disposition: A | Discharge status: Home / Self Care | Source: Ambulatory Visit | Attending: Family Medicine | Admitting: Family Medicine

## 2024-04-19 ENCOUNTER — Ambulatory Visit (HOSPITAL_COMMUNITY): Payer: Self-pay | Admitting: Family Medicine

## 2024-04-19 ENCOUNTER — Encounter (HOSPITAL_COMMUNITY): Payer: Self-pay

## 2024-04-19 DIAGNOSIS — Z9981 Dependence on supplemental oxygen: Secondary | ICD-10-CM | POA: Insufficient documentation

## 2024-04-19 DIAGNOSIS — I2510 Atherosclerotic heart disease of native coronary artery without angina pectoris: Secondary | ICD-10-CM | POA: Diagnosis not present

## 2024-04-19 DIAGNOSIS — E669 Obesity, unspecified: Secondary | ICD-10-CM | POA: Insufficient documentation

## 2024-04-19 DIAGNOSIS — G4733 Obstructive sleep apnea (adult) (pediatric): Secondary | ICD-10-CM | POA: Diagnosis not present

## 2024-04-19 DIAGNOSIS — Z77018 Contact with and (suspected) exposure to other hazardous metals: Secondary | ICD-10-CM | POA: Insufficient documentation

## 2024-04-19 DIAGNOSIS — Z79899 Other long term (current) drug therapy: Secondary | ICD-10-CM | POA: Diagnosis not present

## 2024-04-19 DIAGNOSIS — J42 Unspecified chronic bronchitis: Secondary | ICD-10-CM | POA: Insufficient documentation

## 2024-04-19 DIAGNOSIS — E119 Type 2 diabetes mellitus without complications: Secondary | ICD-10-CM

## 2024-04-19 DIAGNOSIS — I272 Pulmonary hypertension, unspecified: Secondary | ICD-10-CM | POA: Insufficient documentation

## 2024-04-19 DIAGNOSIS — E1140 Type 2 diabetes mellitus with diabetic neuropathy, unspecified: Secondary | ICD-10-CM | POA: Diagnosis not present

## 2024-04-19 DIAGNOSIS — Z6841 Body Mass Index (BMI) 40.0 and over, adult: Secondary | ICD-10-CM | POA: Insufficient documentation

## 2024-04-19 DIAGNOSIS — Z7984 Long term (current) use of oral hypoglycemic drugs: Secondary | ICD-10-CM | POA: Insufficient documentation

## 2024-04-19 DIAGNOSIS — I10 Essential (primary) hypertension: Secondary | ICD-10-CM | POA: Diagnosis not present

## 2024-04-19 LAB — CBC
HCT: 38.2 % — ABNORMAL LOW (ref 39.0–52.0)
Hemoglobin: 12.5 g/dL — ABNORMAL LOW (ref 13.0–17.0)
MCH: 31.8 pg (ref 26.0–34.0)
MCHC: 32.7 g/dL (ref 30.0–36.0)
MCV: 97.2 fL (ref 80.0–100.0)
Platelets: 249 10*3/uL (ref 150–400)
RBC: 3.93 MIL/uL — ABNORMAL LOW (ref 4.22–5.81)
RDW: 12.3 % (ref 11.5–15.5)
WBC: 7.3 10*3/uL (ref 4.0–10.5)
nRBC: 0 % (ref 0.0–0.2)

## 2024-04-19 LAB — BRAIN NATRIURETIC PEPTIDE: B Natriuretic Peptide: 41.1 pg/mL (ref 0.0–100.0)

## 2024-04-19 LAB — IRON AND TIBC
Iron: 94 ug/dL (ref 45–182)
Saturation Ratios: 23 % (ref 17.9–39.5)
TIBC: 410 ug/dL (ref 250–450)
UIBC: 316 ug/dL

## 2024-04-19 LAB — FERRITIN: Ferritin: 33 ng/mL (ref 24–336)

## 2024-04-19 LAB — BASIC METABOLIC PANEL WITH GFR
Anion gap: 10 (ref 5–15)
BUN: 33 mg/dL — ABNORMAL HIGH (ref 8–23)
CO2: 31 mmol/L (ref 22–32)
Calcium: 9.5 mg/dL (ref 8.9–10.3)
Chloride: 101 mmol/L (ref 98–111)
Creatinine, Ser: 2.07 mg/dL — ABNORMAL HIGH (ref 0.61–1.24)
GFR, Estimated: 34 mL/min — ABNORMAL LOW (ref >60)
Glucose, Bld: 105 mg/dL — ABNORMAL HIGH (ref 70–99)
Potassium: 5.4 mmol/L — ABNORMAL HIGH (ref 3.5–5.1)
Sodium: 142 mmol/L (ref 135–145)

## 2024-04-19 NOTE — Patient Instructions (Addendum)
 Thank you for coming in today  If you had labs drawn today, any labs that are abnormal the clinic will call you No news is good news  Medications: No changes  Follow up appointments:  Your physician recommends that you schedule a follow-up appointment in:  3 months With Dr. Mclean with echocardiogram Please call our office to schedule the follow-up appointment in July for September 2025  Your physician has requested that you have an echocardiogram. Echocardiography is a painless test that uses sound waves to create images of your heart. It provides your doctor with information about the size and shape of your heart and how well your heart's chambers and valves are working. This procedure takes approximately one hour. There are no restrictions for this procedure.      Do the following things EVERYDAY: Weigh yourself in the morning before breakfast. Write it down and keep it in a log. Take your medicines as prescribed Eat low salt foods--Limit salt (sodium) to 2000 mg per day.  Stay as active as you can everyday Limit all fluids for the day to less than 2 liters   At the [facility], you and your health needs are our priority. As part of our continuing mission to provide you with exceptional heart care, we have created designated Provider Care Teams. These Care Teams include your primary Cardiologist (physician) and Advanced Practice Providers (APPs- Physician Assistants and Nurse Practitioners) who all work together to provide you with the care you need, when you need it.   You may see any of the following providers on your designated Care Team at your next follow up: Dr Jules Oar Dr Peder Bourdon Dr. Mimi Alt, NP Ruddy Corral, [place] [facility] [place], [place] Dennise Fitz, NP Luster Salters, PharmD   Please be sure to bring in all your medications bottles to every appointment.    Thank you for choosing [facility]  If you have any questions or concerns before your next appointment please send us  a message through [place] or call our office at [phone].    TO LEAVE A MESSAGE FOR THE NURSE SELECT OPTION 2, PLEASE LEAVE A MESSAGE INCLUDING: YOUR NAME DATE OF BIRTH CALL BACK NUMBER REASON FOR CALL**this is important as we prioritize the call backs  YOU WILL RECEIVE A CALL BACK THE SAME DAY AS LONG AS YOU CALL BEFORE 4:00 PM

## 2024-04-19 NOTE — Addendum Note (Signed)
 Encounter addended by: Dewey Fordyce, CMA on: 04/19/2024 4:27 PM  Actions taken: Visit diagnoses modified, Order list changed, Diagnosis association updated, Charge Capture section accepted

## 2024-04-20 NOTE — Progress Notes (Deleted)
 Name: Colton Jackson. DOB: 04/16/1951 MRN: [id number]  History of Present Illness: Colton Jackson is a 73 y.o. male who presents today for follow up visit at [facility] [place].  Relevant History includes: 1. BPH with LUTS (weak stream, frequency, nocturia, urgency, incomplete bladder emptying). - Taking Flomax  (Tamsulosin ) 0.4 mg daily. 2. OAB with urge incontinence. - Exacerbating factors include: diuretic use, glucosuria (due to Farxiga  use), OSA.  - Previous treatments: PTNS, Gemtesa  (ineffective & cost prohibitive, dual therapy with Trospium  + Gemtesa  (helped but worsened LE edema).  - Taking Sanctura  (Trospium ) 20 mg 2x/day. 3. Urethral stricture. - Mild panurethral stricture disease noted on cystoscopy by Dr. Inga Manges on 08/12/2023. 4. Prior microhematuria.  - Negative workup including cystoscopy (08/12/2023) and CT abdomen/pelvis w/ contrast (06/03/2022). 5. Kidney stones.  - 06/03/2022: CT showed 2 punctate left kidney stones. 6. Erectile dysfunction.  - Previously took Sildenafil PRN.  At last visit on 03/31/2024: - Ongoing bothersome LUTS. - Incomplete bladder emptying with elevated PVR (191 ml). He has several possible contributing factors for incomplete bladder emptying Trospium  use, including BPH, urethral stricture, and neurogenic risk factors (T2DM, degenerative disc disease, distant history of nicotine use). Possible neurogenic bladder. - The plan was: 1. RUS within 1-2 weeks for stone surveillance and to rule out hydronephrosis secondary to incomplete bladder emptying.  2. Increase Flomax  (Tamsulosin ) 0.4 mg twice per day. 3. Continue Sanctura  (Trospium ) 20 mg twice per day.  4. Minimize / avoid caffeine intake. 5. Work on timed voiding / bladder retraining. 6. Follow up with PVR recheck in 3-4 weeks.   ***If PVR still elevated, consider switching from Flomax  to Rapaflo and/or discontinuation of Trospium .  Since last visit: > 04/13/2024: RUS showed  ***  Today: He reports *** He {Actions; denies-reports:120008} urinary urgency, frequency, nocturia x***, dysuria, gross hematuria, ***weak urinary stream, hesitancy, straining to void, or sensations of incomplete emptying.   Medications: Current Outpatient Medications  Medication Sig Dispense Refill   acetaminophen  (TYLENOL ) 500 MG tablet Take 500 mg by mouth 3 (three) times daily as needed (pain.).     azelastine  (ASTELIN ) 0.1 % nasal spray 1 puff each nostril twice daily as needed 30 mL 12   Cholecalciferol (VITAMIN D3) 10 MCG (400 UNIT) CAPS Take 1 capsule by mouth daily.     cromolyn  (NASALCROM ) 5.2 MG/ACT nasal spray Place 1 spray into both nostrils 3 (three) times daily as needed for allergies.      dapagliflozin  propanediol (FARXIGA ) 10 MG TABS tablet Take 1 tablet (10 mg total) by mouth daily before breakfast. 30 tablet 8   famotidine  (PEPCID ) 20 MG tablet Take 20 mg by mouth 2 (two) times daily.      furosemide  (LASIX ) 20 MG tablet Take 1 tablet (20 mg total) by mouth daily. Please have patient call for office visit [phone] 30 tablet 0   glipiZIDE  (GLUCOTROL ) 10 MG tablet Take 10 mg by mouth 2 (two) times daily.      HYDROcodone -acetaminophen  (NORCO) 10-325 MG tablet Take 1 tablet by mouth 3 (three) times daily as needed for moderate pain.      latanoprost  (XALATAN ) 0.005 % ophthalmic solution INSTILL 1 DROP IN BOTH EYES EVERY DAY AT BEDTIME 7.5 mL 12   losartan  (COZAAR ) 50 MG tablet Take 0.5 tablets (25 mg total) by mouth daily. 45 tablet 3   meclizine  (ANTIVERT ) 25 MG tablet Take 25 mg by mouth 3 (three) times daily as needed for dizziness or nausea (when taking hydrocodone /apap).  meloxicam (MOBIC) 7.5 MG tablet Take 7.5 mg by mouth 2 (two) times daily.     metFORMIN  (GLUCOPHAGE -XR) 500 MG 24 hr tablet Take 1,000 mg by mouth 2 (two) times daily.     metoprolol  succinate (TOPROL -XL) 50 MG 24 hr tablet Take 1 tablet (50 mg total) by mouth daily. 30 tablet 3   OXYGEN   Inhale 5-6 L/hr into the lungs daily. Pt on 6 liters at home     psyllium (METAMUCIL) 58.6 % powder Take 1 packet by mouth 2 (two) times daily.      spironolactone  (ALDACTONE ) 25 MG tablet TAKE 1 TABLET(25 MG) BY MOUTH DAILY (Patient taking differently: Take 12.5 mg by mouth daily.) 90 tablet 3   tamsulosin  (FLOMAX ) 0.4 MG CAPS capsule Take 1 capsule (0.4 mg total) by mouth in the morning and at bedtime. 180 capsule 3   trospium  (SANCTURA ) 20 MG tablet TAKE 1 TABLET(20 MG) BY MOUTH TWICE DAILY 180 tablet 3   No current facility-administered medications for this visit.    Allergies: Allergies  Allergen Reactions   Covid-19 Mrna Vaccine [employer]) [Covid-19 Mrna Vacc (Moderna)] Other (See Comments)    Joint pain and fatigue   Sulfa Antibiotics Swelling    eyelids    Past Medical History:  Diagnosis Date   Allergic rhinitis    Aneurysm artery, neck (HCC)    Dr. Linder Revere pulmonologist   Degenerative disk disease    Essential hypertension    GERD (gastroesophageal reflux disease)    H/O cardiovascular stress test 02/02/11   EF 59% - Normal stress test   Hearing deficit, right    Knee pain, bilateral    Lower back pain    Morbid obesity (HCC)    OSA (obstructive sleep apnea)    On CPAP   Thyroid  nodule    Type 2 diabetes mellitus (HCC)    Type 2 diabetes mellitus without complication (HCC) 06/26/2008   Qualifier: Diagnosis of  By: Linder Revere MD, Clinton D    Past Surgical History:  Procedure Laterality Date   APPENDECTOMY     CARDIAC CATHETERIZATION     Cataract surgery Bilateral    COLONOSCOPY WITH PROPOFOL  N/A 08/01/2019   Procedure: COLONOSCOPY WITH PROPOFOL ;  Surgeon: Alanda Allegra, MD;  [facility]: [facility];  Service: General;  Laterality: N/A;   KNEE ARTHROSCOPY Bilateral    NASAL SEPTOPLASTY W/ TURBINOPLASTY     RIGHT HEART CATH N/A 07/13/2022   Procedure: RIGHT HEART CATH;  Surgeon: Darlis Eisenmenger, MD;  Location: [facility] INVASIVE CV LAB;  Service: Cardiovascular;  Laterality:  N/A;   RIGHT/LEFT HEART CATH AND CORONARY ANGIOGRAPHY N/A 06/23/2018   Procedure: RIGHT/LEFT HEART CATH AND CORONARY ANGIOGRAPHY;  Surgeon: Darlis Eisenmenger, MD;  Location: [facility] INVASIVE CV LAB;  Service: Cardiovascular;  Laterality: N/A;   Family History  Problem Relation Age of Onset   Lung disease Father    Stroke Father 5   Diabetes Father    Diabetes Maternal Grandfather    Heart disease Paternal Grandmother    Stroke Paternal Grandfather 9   Diabetes Paternal Grandfather    Diabetes Mother    Cancer Mother    Stroke Mother 32   Diabetes Sister    Diabetes Brother 37   Social History   Socioeconomic History   Marital status: Widowed    Spouse name: Not on file   Number of children: Not on file   Years of education: Not on file   Highest education level: Not on file  Occupational History   Occupation: former Psychologist, occupational, tow truck service  Tobacco Use   Smoking status: Former    Current packs/day: 0.00    Types: Cigarettes    Quit date: 1971    Years since quitting: 54.4   Smokeless tobacco: Never   Tobacco comments:    smoked when he was a teenager  Vaping Use   Vaping status: Never Used  Substance and Sexual Activity   Alcohol use: No   Drug use: No   Sexual activity: Not Currently  Other Topics Concern   Not on file  Social History Narrative   Not on file   Social Drivers of Health   Financial Resource Strain: Not on file  Food Insecurity: Not on file  Transportation Needs: Not on file  Physical Activity: Not on file  Stress: Not on file  Social Connections: Not on file  Intimate Partner Violence: Not on file    Review of Systems Constitutional: Patient denies any unintentional weight loss or change in strength lntegumentary: Patient denies any rashes or pruritus Cardiovascular: Patient denies chest pain or syncope Respiratory: Patient denies shortness of breath Gastrointestinal: ***Patient denies nausea, vomiting, constipation, or diarrhea ***As per  HPI Musculoskeletal: Patient denies muscle cramps or weakness Neurologic: Patient denies convulsions or seizures Allergic/Immunologic: Patient denies recent allergic reaction(s) Hematologic/Lymphatic: Patient denies bleeding tendencies Endocrine: Patient denies heat/cold intolerance  GU: As per HPI.  OBJECTIVE There were no vitals filed for this visit. There is no height or weight on file to calculate BMI.  Physical Examination Constitutional: No obvious distress; patient is non-toxic appearing  Cardiovascular: No visible lower extremity edema.  Respiratory: The patient does not have audible wheezing/stridor; respirations do not appear labored  Gastrointestinal: Abdomen non-distended Musculoskeletal: Normal ROM of UEs  Skin: No obvious rashes/open sores  Neurologic: CN 2-12 grossly intact Psychiatric: Answered questions appropriately with normal affect  Hematologic/Lymphatic/Immunologic: No obvious bruises or sites of spontaneous bleeding  UA: ***negative ***positive for *** leukocytes, *** blood, ***nitrites Urine microscopy: *** WBC/hpf, *** RBC/hpf, *** bacteria ***glucosuria (secondary to ***Jardiance ***Farxiga  use) ***otherwise unremarkable  PVR: *** ml  ASSESSMENT No diagnosis found. ***  We agreed to plan for follow up in *** months / ***1 year or sooner if needed. Patient verbalized understanding of and agreement with current plan. All questions were answered.  PLAN Advised the following: 1. *** 2. ***No follow-ups on file.  No orders of the defined types were placed in this encounter.   It has been explained that the patient is to follow regularly with their PCP in addition to all other providers involved in their care and to follow instructions provided by these respective offices. Patient advised to contact urology clinic if any urologic-pertaining questions, concerns, new symptoms or problems arise in the interim period.  There are no Patient Instructions  on file for this visit.  Electronically signed by:  Lauretta Ponto, FNP   04/20/24    1:09 PM

## 2024-04-26 ENCOUNTER — Ambulatory Visit (HOSPITAL_COMMUNITY): Payer: Self-pay | Admitting: Family Medicine

## 2024-04-26 ENCOUNTER — Ambulatory Visit (HOSPITAL_COMMUNITY)
Admission: RE | Admit: 2024-04-26 | Discharge: 2024-04-26 | Disposition: A | Discharge status: Home / Self Care | Source: Ambulatory Visit | Attending: Cardiology | Admitting: Cardiology

## 2024-04-26 DIAGNOSIS — I272 Pulmonary hypertension, unspecified: Secondary | ICD-10-CM | POA: Insufficient documentation

## 2024-04-26 LAB — BASIC METABOLIC PANEL WITH GFR
Anion gap: 7 (ref 5–15)
BUN: 33 mg/dL — ABNORMAL HIGH (ref 8–23)
CO2: 29 mmol/L (ref 22–32)
Calcium: 9.3 mg/dL (ref 8.9–10.3)
Chloride: 103 mmol/L (ref 98–111)
Creatinine, Ser: 2.36 mg/dL — ABNORMAL HIGH (ref 0.61–1.24)
GFR, Estimated: 29 mL/min — ABNORMAL LOW (ref >60)
Glucose, Bld: 197 mg/dL — ABNORMAL HIGH (ref 70–99)
Potassium: 5.1 mmol/L (ref 3.5–5.1)
Sodium: 139 mmol/L (ref 135–145)

## 2024-05-01 ENCOUNTER — Other Ambulatory Visit: Payer: Self-pay | Admitting: *Deleted

## 2024-05-01 ENCOUNTER — Ambulatory Visit: Admitting: Urology

## 2024-05-01 DIAGNOSIS — N3941 Urge incontinence: Secondary | ICD-10-CM

## 2024-05-01 DIAGNOSIS — N401 Enlarged prostate with lower urinary tract symptoms: Secondary | ICD-10-CM

## 2024-05-01 DIAGNOSIS — N529 Male erectile dysfunction, unspecified: Secondary | ICD-10-CM

## 2024-05-01 DIAGNOSIS — R3915 Urgency of urination: Secondary | ICD-10-CM

## 2024-05-01 DIAGNOSIS — N200 Calculus of kidney: Secondary | ICD-10-CM

## 2024-05-01 DIAGNOSIS — N3281 Overactive bladder: Secondary | ICD-10-CM

## 2024-05-01 MED ORDER — LOSARTAN POTASSIUM 50 MG PO TABS: 25.0000 mg | ORAL_TABLET | Freq: Every day | ORAL | 3 refills | Status: AC

## 2024-05-03 ENCOUNTER — Ambulatory Visit: Payer: Self-pay

## 2024-05-03 NOTE — Telephone Encounter (Signed)
 Letter sent.

## 2024-05-03 NOTE — Progress Notes (Signed)
 Name: Colton Jackson. DOB: [birth year] MRN: [id number]  History of Present Illness: Colton Jackson is a 73 y.o. male who presents today for follow up visit at [facility] [place].  Relevant History includes: 1. BPH with LUTS (weak stream, frequency, nocturia, urgency, incomplete bladder emptying). - Taking Flomax  (Tamsulosin ) 0.4 mg daily. 2. OAB with urge incontinence. - Exacerbating factors include: diuretic use, glucosuria (due to Farxiga  use), OSA.  - Previous treatments: PTNS, Gemtesa  (ineffective & cost prohibitive, dual therapy with Trospium  + Gemtesa  (helped but worsened LE edema).  - Taking Sanctura  (Trospium ) 20 mg 2x/day. 3. Urethral stricture. - Mild panurethral stricture disease noted on cystoscopy by Dr. Watt on 08/12/2023. 4. Prior microhematuria.  - Negative workup including cystoscopy (08/12/2023) and CT abdomen/pelvis w/ contrast (06/03/2022). 5. Kidney stones.  - 06/03/2022: CT showed 2 punctate left kidney stones. 6. Erectile dysfunction.  - Previously took Sildenafil PRN.  At last visit on 03/31/2024: - Ongoing bothersome LUTS. - Incomplete bladder emptying with elevated PVR (191 ml). He has several possible contributing factors for incomplete bladder emptying Trospium  use, including BPH, urethral stricture, and neurogenic risk factors (T2DM, degenerative disc disease, distant history of nicotine use). Possible neurogenic bladder. - The plan was: 1. RUS within 1-2 weeks for stone surveillance and to rule out hydronephrosis secondary to incomplete bladder emptying.  2. Increase Flomax  (Tamsulosin ) 0.4 mg twice per day. 3. Continue Sanctura  (Trospium ) 20 mg twice per day.  4. Minimize / avoid caffeine intake. 5. Work on timed voiding / bladder retraining. 6. Follow up with PVR recheck in 3-4 weeks.   Since last visit: > RUS on 04/13/2024: Very limited evaluation. No definite abnormalities. No GU stones, masses, or hydronephrosis.  Today: He reports that  his urinary symptoms are whole lot better since last visit. He states his urinary urgency, frequency, nocturia, hesitancy, etc. are all manageable at this time.   He reports ongoing erectile dysfunction. States he tried a medication or supplement (unsure which) that he purchased off the TV which didn't help. He denies significant bother from his ED however as he does not have a romantic partner.  Of note: He reports that he has a phone appointment scheduled next week to establish care with a Nephrology provider for his CKD.    Medications: Current Outpatient Medications  Medication Sig Dispense Refill   acetaminophen  (TYLENOL ) 500 MG tablet Take 500 mg by mouth 3 (three) times daily as needed (pain.).     azelastine  (ASTELIN ) 0.1 % nasal spray 1 puff each nostril twice daily as needed 30 mL 12   Cholecalciferol (VITAMIN D3) 10 MCG (400 UNIT) CAPS Take 1 capsule by mouth daily.     dapagliflozin  propanediol (FARXIGA ) 10 MG TABS tablet Take 1 tablet (10 mg total) by mouth daily before breakfast. 30 tablet 8   famotidine  (PEPCID ) 20 MG tablet Take 20 mg by mouth 2 (two) times daily.      furosemide  (LASIX ) 20 MG tablet Take 1 tablet (20 mg total) by mouth daily. Please have patient call for office visit [phone] 30 tablet 0   glipiZIDE  (GLUCOTROL ) 10 MG tablet Take 10 mg by mouth 2 (two) times daily.      HYDROcodone -acetaminophen  (NORCO) 10-325 MG tablet Take 1 tablet by mouth 3 (three) times daily as needed for moderate pain.      latanoprost  (XALATAN ) 0.005 % ophthalmic solution INSTILL 1 DROP IN BOTH EYES EVERY DAY AT BEDTIME 7.5 mL 12   losartan  (COZAAR ) 50 MG tablet Take  0.5 tablets (25 mg total) by mouth daily. 45 tablet 3   meclizine  (ANTIVERT ) 25 MG tablet Take 25 mg by mouth 3 (three) times daily as needed for dizziness or nausea (when taking hydrocodone /apap).      meloxicam (MOBIC) 7.5 MG tablet Take 7.5 mg by mouth 2 (two) times daily.     metFORMIN  (GLUCOPHAGE -XR) 500 MG 24  hr tablet Take 1,000 mg by mouth 2 (two) times daily.     metoprolol  succinate (TOPROL -XL) 50 MG 24 hr tablet Take 1 tablet (50 mg total) by mouth daily. 30 tablet 3   OXYGEN  Inhale 5-6 L/hr into the lungs daily. Pt on 6 liters at home     psyllium (METAMUCIL) 58.6 % powder Take 1 packet by mouth 2 (two) times daily.      tamsulosin  (FLOMAX ) 0.4 MG CAPS capsule Take 1 capsule (0.4 mg total) by mouth in the morning and at bedtime. 180 capsule 3   trospium  (SANCTURA ) 20 MG tablet TAKE 1 TABLET(20 MG) BY MOUTH TWICE DAILY 180 tablet 3   cromolyn  (NASALCROM ) 5.2 MG/ACT nasal spray Place 1 spray into both nostrils 3 (three) times daily as needed for allergies.  (Patient not taking: Reported on 05/04/2024)     spironolactone  (ALDACTONE ) 25 MG tablet TAKE 1 TABLET(25 MG) BY MOUTH DAILY (Patient not taking: Reported on 05/04/2024) 90 tablet 3   No current facility-administered medications for this visit.    Allergies: Allergies  Allergen Reactions   Covid-19 Mrna Vaccine [employer]) [Covid-19 Mrna Vacc (Moderna)] Other (See Comments)    Joint pain and fatigue   Sulfa Antibiotics Swelling    eyelids    Past Medical History:  Diagnosis Date   Allergic rhinitis    Aneurysm artery, neck (HCC)    Dr. Neysa pulmonologist   Degenerative disk disease    Essential hypertension    GERD (gastroesophageal reflux disease)    H/O cardiovascular stress test 02/02/11   EF 59% - Normal stress test   Hearing deficit, right    Knee pain, bilateral    Lower back pain    Morbid obesity (HCC)    OSA (obstructive sleep apnea)    On CPAP   Thyroid  nodule    Type 2 diabetes mellitus (HCC)    Type 2 diabetes mellitus without complication (HCC) 06/26/2008   Qualifier: Diagnosis of  By: Neysa MD, Clinton D    Past Surgical History:  Procedure Laterality Date   APPENDECTOMY     CARDIAC CATHETERIZATION     Cataract surgery Bilateral    COLONOSCOPY WITH PROPOFOL  N/A 08/01/2019   Procedure: COLONOSCOPY WITH  PROPOFOL ;  Surgeon: Mavis Anes, MD;  Location: [facility];  Service: General;  Laterality: N/A;   KNEE ARTHROSCOPY Bilateral    NASAL SEPTOPLASTY W/ TURBINOPLASTY     RIGHT HEART CATH N/A 07/13/2022   Procedure: RIGHT HEART CATH;  Surgeon: Rolan Ezra RAMAN, MD;  Location: [facility] INVASIVE CV LAB;  Service: Cardiovascular;  Laterality: N/A;   RIGHT/LEFT HEART CATH AND CORONARY ANGIOGRAPHY N/A 06/23/2018   Procedure: RIGHT/LEFT HEART CATH AND CORONARY ANGIOGRAPHY;  Surgeon: Rolan Ezra RAMAN, MD;  Location: [facility] INVASIVE CV LAB;  Service: Cardiovascular;  Laterality: N/A;   Family History  Problem Relation Age of Onset   Lung disease Father    Stroke Father 59   Diabetes Father    Diabetes Maternal Grandfather    Heart disease Paternal Grandmother    Stroke Paternal Grandfather 43   Diabetes Paternal Grandfather  Diabetes Mother    Cancer Mother    Stroke Mother 54   Diabetes Sister    Diabetes Brother 58   Social History   Socioeconomic History   Marital status: Widowed    Spouse name: Not on file   Number of children: Not on file   Years of education: Not on file   Highest education level: Not on file  Occupational History   Occupation: former Psychologist, occupational, tow truck service  Tobacco Use   Smoking status: Former    Current packs/day: 0.00    Types: Cigarettes    Quit date: 1971    Years since quitting: 54.5   Smokeless tobacco: Never   Tobacco comments:    smoked when he was a teenager  Vaping Use   Vaping status: Never Used  Substance and Sexual Activity   Alcohol use: No   Drug use: No   Sexual activity: Not Currently  Other Topics Concern   Not on file  Social History Narrative   Not on file   Social Drivers of Health   Financial Resource Strain: Not on file  Food Insecurity: Not on file  Transportation Needs: Not on file  Physical Activity: Not on file  Stress: Not on file  Social Connections: Not on file  Intimate Partner Violence: Not on file    Review of  Systems Constitutional: Patient denies any unintentional weight loss or change in strength lntegumentary: Patient denies any rashes or pruritus Cardiovascular: Patient denies chest pain or syncope Respiratory: Patient denies shortness of breath Gastrointestinal: Patient denies nausea, vomiting, constipation, or diarrhea   Musculoskeletal: Patient denies muscle cramps or weakness Neurologic: Patient denies convulsions or seizures Allergic/Immunologic: Patient denies recent allergic reaction(s) Hematologic/Lymphatic: Patient denies bleeding tendencies Endocrine: Patient denies heat/cold intolerance  GU: As per HPI.  OBJECTIVE Vitals:   05/04/24 1039  BP: (!) 155/82  Pulse: 80   There is no height or weight on file to calculate BMI.  Physical Examination Constitutional: No obvious distress; patient is non-toxic appearing  Cardiovascular: No visible lower extremity edema.  Respiratory: The patient does not have audible wheezing/stridor; respirations do not appear labored  Gastrointestinal: Abdomen non-distended Musculoskeletal: Normal ROM of UEs  Skin: No obvious rashes/open sores  Neurologic: CN 2-12 grossly intact Psychiatric: Answered questions appropriately with normal affect  Hematologic/Lymphatic/Immunologic: No obvious bruises or sites of spontaneous bleeding  UA: glucosuria (secondary to Farxiga  use), otherwise unremarkable  PVR: 23 ml  ASSESSMENT Benign prostatic hyperplasia with weak urinary stream - Plan: Urinalysis, Routine w reflex microscopic, BLADDER SCAN AMB NON-IMAGING  Urge incontinence - Plan: Urinalysis, Routine w reflex microscopic, BLADDER SCAN AMB NON-IMAGING  Urinary urgency - Plan: Urinalysis, Routine w reflex microscopic, BLADDER SCAN AMB NON-IMAGING  OAB (overactive bladder) - Plan: Urinalysis, Routine w reflex microscopic, BLADDER SCAN AMB NON-IMAGING  Kidney stones - Plan: Urinalysis, Routine w reflex microscopic, BLADDER SCAN AMB  NON-IMAGING  Coronary artery disease involving native heart without angina pectoris, unspecified vessel or lesion type  Vasculogenic erectile dysfunction, unspecified vasculogenic erectile dysfunction type  He is doing well. Will continue current treatment regimen for OAB/BPH; has refills available. We agreed to proceed without ED treatment - would need cardiology clearance prior to PDE-5 inhibitor trial. We agreed to plan for follow up in 6 months or sooner if needed. Patient verbalized understanding of and agreement with current plan. All questions were answered.  PLAN Advised the following: 1. Continue Flomax  (Tamsulosin ) 0.4 mg twice per day. 2. Continue Sanctura  (Trospium ) 20 mg  twice per day.  3. Minimize / avoid caffeine intake. 4. Work on timed voiding / bladder retraining. 5. Return in about 6 months (around 11/03/2024) for BPH, with UA & PVR.  Orders Placed This Encounter  Procedures   Urinalysis, Routine w reflex microscopic   BLADDER SCAN AMB NON-IMAGING   Total time spent caring for the patient today was over 30 minutes. This includes time spent on the date of the visit reviewing the patient's chart before the visit, time spent during the visit, and time spent after the visit on documentation. Over 50% of that time was spent in face-to-face time with this patient for direct counseling. E&M based on time and complexity of medical decision making.  It has been explained that the patient is to follow regularly with their PCP in addition to all other providers involved in their care and to follow instructions provided by these respective offices. Patient advised to contact urology clinic if any urologic-pertaining questions, concerns, new symptoms or problems arise in the interim period.  There are no Patient Instructions on file for this visit.  Electronically signed by:  Lauraine JAYSON Oz, FNP   05/04/24    11:06 AM

## 2024-05-04 ENCOUNTER — Encounter: Payer: Self-pay | Admitting: Urology

## 2024-05-04 ENCOUNTER — Ambulatory Visit (INDEPENDENT_AMBULATORY_CARE_PROVIDER_SITE_OTHER): Admitting: Urology

## 2024-05-04 VITALS — BP 155/82 | HR 80

## 2024-05-04 DIAGNOSIS — N3941 Urge incontinence: Secondary | ICD-10-CM

## 2024-05-04 DIAGNOSIS — N401 Enlarged prostate with lower urinary tract symptoms: Secondary | ICD-10-CM | POA: Diagnosis not present

## 2024-05-04 DIAGNOSIS — N200 Calculus of kidney: Secondary | ICD-10-CM

## 2024-05-04 DIAGNOSIS — N3281 Overactive bladder: Secondary | ICD-10-CM

## 2024-05-04 DIAGNOSIS — R3912 Poor urinary stream: Secondary | ICD-10-CM | POA: Diagnosis not present

## 2024-05-04 DIAGNOSIS — N529 Male erectile dysfunction, unspecified: Secondary | ICD-10-CM | POA: Diagnosis not present

## 2024-05-04 DIAGNOSIS — R3915 Urgency of urination: Secondary | ICD-10-CM

## 2024-05-04 DIAGNOSIS — I2510 Atherosclerotic heart disease of native coronary artery without angina pectoris: Secondary | ICD-10-CM | POA: Diagnosis not present

## 2024-05-04 LAB — URINALYSIS, ROUTINE W REFLEX MICROSCOPIC
Bilirubin, UA: NEGATIVE
Ketones, UA: NEGATIVE
Leukocytes,UA: NEGATIVE
Nitrite, UA: NEGATIVE
Protein,UA: NEGATIVE
RBC, UA: NEGATIVE
Specific Gravity, UA: 1.01 (ref 1.005–1.030)
Urobilinogen, Ur: 0.2 mg/dL (ref 0.2–1.0)
pH, UA: 6 (ref 5.0–7.5)

## 2024-05-04 LAB — BLADDER SCAN AMB NON-IMAGING: Scan Result: 23

## 2024-05-09 DIAGNOSIS — E785 Hyperlipidemia, unspecified: Secondary | ICD-10-CM | POA: Diagnosis not present

## 2024-05-09 DIAGNOSIS — E1122 Type 2 diabetes mellitus with diabetic chronic kidney disease: Secondary | ICD-10-CM | POA: Diagnosis not present

## 2024-05-09 DIAGNOSIS — N184 Chronic kidney disease, stage 4 (severe): Secondary | ICD-10-CM | POA: Diagnosis not present

## 2024-05-09 DIAGNOSIS — I129 Hypertensive chronic kidney disease with stage 1 through stage 4 chronic kidney disease, or unspecified chronic kidney disease: Secondary | ICD-10-CM | POA: Diagnosis not present

## 2024-05-09 DIAGNOSIS — Z008 Encounter for other general examination: Secondary | ICD-10-CM | POA: Diagnosis not present

## 2024-05-14 NOTE — Progress Notes (Unsigned)
 HPI- M never smoker followed for Chronic Hypoxic Respiratory Failure, Allergic rhinitis, OSA, morbid obesity, obesity/ hypoventilation, complicated by back pain, DM, HBP, glaucoma, pulmonary hypertension( Cardiology) ACE level -08/05/11- WNL 24 ACE level 01/15/21- 14 (9-67) NPSG 09/22/14- AHI 72.7/ hr, CPAP to 19, weight 332 lbs PFT 10/21/16-  severe obstruction and restriction with diffusion relatively high for alveolar volume Echocardiogram 10/12/16- BNP 10/09/17- 20.5   WNL O2 qualifying Walk Test 08/07/20-- desat to 87% on rrom air, 94% on 3L HRCT chest 04/08/20- post infectious or inflammatory scarring and volume loss. There are no specific findings to suggest fibrotic interstitial lung disease.  There are numerous prominent, hyperdense, although not pathologically enlarged mediastinal and hilar lymph nodes, unchanged compared to prior examination and most consistent with nodal involvement of sarcoidosis, although occupational inhalational lung disease may also have this appearance. 4. Coronary artery disease. Aortic Atherosclerosis (ICD10-I70.0). R Heart Cath 07/13/22- severe PAH ? Group 3, possible group 1 component>continue O2, CPAP but consider Tyvaso. 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 Download- compliance 97%, AHI 4.8/ hr Body weight today 288 lbs Covid vax- 3 Moderna Flu vax -  Pulmonary Rehab ------Pt is doing well Download reviewed.  Doing well with CPAP plus oxygen  at night. Does not feel that his breathing is any  different-certainly no worse. He has not been able to lose weight which is a major factor.  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 Download- compliance 100%, AHI 3.9/hr Body weight today -290 lbs Discussed the use of AI scribe software for clinical note transcription with the patient, who gave verbal consent to proceed.  History of Present Illness   Colton Jackson. is a 73 year old male who presents with CPAP supply issues.  He is experiencing difficulty obtaining CPAP supplies due to financial constraints, but maintains consistent use of his CPAP machine. He experiences between three and four breakthrough apneas per hour on his current CPAP therapy. His oxygen  supply is provided by [employer], the same company responsible for his CPAP supplies. No chest pain, shortness of breath, or palpitations. His chronic respiratory failure with hypoxia is stable. He uses home O2 at 6l, including through CPAP. He uses a portable tank for continuous flow at 2L.     Assessment and Plan:    Obstructive Sleep Apnea Obstructive sleep apnea controlled with CPAP therapy, 3-4 apneas/hour. Billing issue affecting CPAP supply acquisition. - Contact [employer] to confirm CPAP supply needs.   Chronic Respiratory Failure with Hypoxia - Order chest X-ray to update imaging. -Continue current oxygen    Morbid Obesity No progress  CXR 02/24/23 MPRESSION: No active cardiopulmonary disease.  ROS-see HPI   += positive Constitutional:    weight loss, night sweats, fevers, chills, fatigue, lassitude. HEENT:    headaches, difficulty swallowing,  tooth/dental problems, sore throat,  sneezing, itching, ear ache, nasal congestion, post nasal drip, snoring CV:    chest pain, orthopnea, PND, swelling in lower extremities, anasarca,                                   dizziness, palpitations Resp:  + shortness of breath with exertion or at rest.                productive cough,   non-productive cough, coughing up of blood.              change in color of mucus.  wheezing.   Skin:    rash or lesions. GI:  No-   heartburn, indigestion, abdominal pain, nausea, vomiting, diarrhea,                 change in bowel habits, loss of appetite GU: dysuria, change in color of urine, no urgency or frequency.   flank pain. MS:   +joint pain, stiffness, decreased range of motion, back pain. Neuro-     nothing unusual Psych:  change in mood or affect.  depression or anxiety.   memory loss.  OBJ- Physical Exam     His O2 2L  + morbidly obese, +on O2 2L tank General- Alert, Oriented, Affect-appropriate, Distress- none acute, +morbid Obesity Skin- rash-none, lesions- none, excoriation- none Lymphadenopathy- none Head- atraumatic            Eyes- Gross vision intact, PERRLA, conjunctivae and secretions clear            Ears- Hearing, canals-normal            Nose- Clear, no-Septal dev, mucus, polyps, erosion, perforation             Throat- Mallampati III , mucosa clear , drainage- none, tonsils- atrophic Neck- flexible , trachea midline, no stridor , thyroid  nl, carotid no bruit Chest - symmetrical excursion , unlabored           Heart/CV- RRR , no murmur , no gallop  , no rub, nl s1 s2                           - JVD- none , edema- none, stasis changes- none, varices- none           Lung- clear/distant- I don't hear rales or crackles., wheeze- none, cough- none , dullness-none, rub- none           Chest wall-  Abd-  Br/ Gen/ Rectal- Not done, not indicated Extrem- cyanosis- none, clubbing, none, atrophy- none, strength- nl Neuro- grossly intact  to observation

## 2024-05-15 DIAGNOSIS — G8929 Other chronic pain: Secondary | ICD-10-CM | POA: Diagnosis not present

## 2024-05-15 DIAGNOSIS — E6609 Other obesity due to excess calories: Secondary | ICD-10-CM | POA: Diagnosis not present

## 2024-05-15 DIAGNOSIS — Z6841 Body Mass Index (BMI) 40.0 and over, adult: Secondary | ICD-10-CM | POA: Diagnosis not present

## 2024-05-16 ENCOUNTER — Encounter: Payer: Self-pay | Admitting: Internal Medicine

## 2024-05-16 ENCOUNTER — Ambulatory Visit (INDEPENDENT_AMBULATORY_CARE_PROVIDER_SITE_OTHER): Admitting: Internal Medicine

## 2024-05-16 ENCOUNTER — Ambulatory Visit (INDEPENDENT_AMBULATORY_CARE_PROVIDER_SITE_OTHER)

## 2024-05-16 VITALS — BP 136/78 | HR 83 | Temp 97.7°F | Ht 68.0 in | Wt 290.2 lb

## 2024-05-16 DIAGNOSIS — J9611 Chronic respiratory failure with hypoxia: Secondary | ICD-10-CM

## 2024-05-16 DIAGNOSIS — G4733 Obstructive sleep apnea (adult) (pediatric): Secondary | ICD-10-CM | POA: Diagnosis not present

## 2024-05-16 NOTE — Patient Instructions (Signed)
 Order- CXR   dx Chronic respiratory failure with hypoxia  You are doing well with your CPAP- good compliance and control so it benefits you to continue at fixed pressure 19, with oxygen  6l at home, 2L portable tank.  Please call if we can help

## 2024-05-19 ENCOUNTER — Ambulatory Visit: Payer: Self-pay | Admitting: Internal Medicine

## 2024-06-02 DIAGNOSIS — N184 Chronic kidney disease, stage 4 (severe): Secondary | ICD-10-CM | POA: Diagnosis not present

## 2024-06-13 DIAGNOSIS — E109 Type 1 diabetes mellitus without complications: Secondary | ICD-10-CM | POA: Diagnosis not present

## 2024-06-13 DIAGNOSIS — Z6841 Body Mass Index (BMI) 40.0 and over, adult: Secondary | ICD-10-CM | POA: Diagnosis not present

## 2024-06-13 DIAGNOSIS — G894 Chronic pain syndrome: Secondary | ICD-10-CM | POA: Diagnosis not present

## 2024-06-13 DIAGNOSIS — G8929 Other chronic pain: Secondary | ICD-10-CM | POA: Diagnosis not present

## 2024-06-16 ENCOUNTER — Other Ambulatory Visit (HOSPITAL_COMMUNITY): Payer: Self-pay | Admitting: Family Medicine

## 2024-06-20 DIAGNOSIS — E785 Hyperlipidemia, unspecified: Secondary | ICD-10-CM | POA: Diagnosis not present

## 2024-06-20 DIAGNOSIS — N1832 Chronic kidney disease, stage 3b: Secondary | ICD-10-CM | POA: Diagnosis not present

## 2024-06-20 DIAGNOSIS — Z008 Encounter for other general examination: Secondary | ICD-10-CM | POA: Diagnosis not present

## 2024-06-20 DIAGNOSIS — I129 Hypertensive chronic kidney disease with stage 1 through stage 4 chronic kidney disease, or unspecified chronic kidney disease: Secondary | ICD-10-CM | POA: Diagnosis not present

## 2024-07-14 DIAGNOSIS — G894 Chronic pain syndrome: Secondary | ICD-10-CM | POA: Diagnosis not present

## 2024-07-14 DIAGNOSIS — Z6841 Body Mass Index (BMI) 40.0 and over, adult: Secondary | ICD-10-CM | POA: Diagnosis not present

## 2024-07-16 ENCOUNTER — Other Ambulatory Visit (HOSPITAL_COMMUNITY): Payer: Self-pay | Admitting: Family Medicine

## 2024-07-18 DIAGNOSIS — Z008 Encounter for other general examination: Secondary | ICD-10-CM | POA: Diagnosis not present

## 2024-07-18 DIAGNOSIS — H409 Unspecified glaucoma: Secondary | ICD-10-CM | POA: Diagnosis not present

## 2024-07-18 DIAGNOSIS — I129 Hypertensive chronic kidney disease with stage 1 through stage 4 chronic kidney disease, or unspecified chronic kidney disease: Secondary | ICD-10-CM | POA: Diagnosis not present

## 2024-07-18 DIAGNOSIS — N1832 Chronic kidney disease, stage 3b: Secondary | ICD-10-CM | POA: Diagnosis not present

## 2024-07-18 DIAGNOSIS — E785 Hyperlipidemia, unspecified: Secondary | ICD-10-CM | POA: Diagnosis not present

## 2024-07-20 ENCOUNTER — Telehealth (HOSPITAL_BASED_OUTPATIENT_CLINIC_OR_DEPARTMENT_OTHER): Payer: Self-pay

## 2024-07-20 NOTE — Telephone Encounter (Signed)
 Copied from CRM #[id number]. Topic: Clinical - Order For Equipment >> Jul 18, 2024 12:05 PM Rilla NOVAK wrote: Inocente from [facility]. Patient switching medical equipment companies.  New order for oxygen  should be sent to [facility] FAX: [phone].  Please include notes and demographics with diagnosis.

## 2024-07-21 ENCOUNTER — Telehealth: Payer: Self-pay | Admitting: Internal Medicine

## 2024-07-21 DIAGNOSIS — J9611 Chronic respiratory failure with hypoxia: Secondary | ICD-10-CM

## 2024-07-21 NOTE — Telephone Encounter (Signed)
 Number to call back- [phone]   I called and spoke with Arleene at [facility]- DME  She states that the pt is needing a new o2 order  The last time that we placed DME referral for o2 was in 2021   Dr Neysa- please advise directions for o2 use

## 2024-07-21 NOTE — Telephone Encounter (Signed)
 Copied from CRM [id number]. Topic: Clinical - Order For Equipment >> Jul 20, 2024  2:37 PM [name] wrote: Reason for CRM: [name] is calling to request that an order for patient's oxygen  with provider signature and liter flow details be sent to fax: [phone]. States they need this order to proceed with auth for patient's oxygen  through [facility].

## 2024-07-21 NOTE — Telephone Encounter (Signed)
 Renew home O2 continuous and portable, from dx 2021- 2L at rest, 5L with exertion- continuous tank    Dx Chronic Respiratory Failure with Hypoxia

## 2024-07-21 NOTE — Telephone Encounter (Signed)
 Patient was reauthed this morning by [facility]. The patient is orignally from [employer]([facility] .) Nothing [facility] can do. NFN

## 2024-07-21 NOTE — Telephone Encounter (Signed)
 No call back number  Waiting for [employer] to provide call back number

## 2024-07-24 NOTE — Telephone Encounter (Signed)
 Dme referral placed as advised below by Dr Neysa.

## 2024-07-31 ENCOUNTER — Telehealth: Payer: Self-pay

## 2024-07-31 NOTE — Telephone Encounter (Signed)
 Pt called to confirm Rx change request pt states he would like to be on Rx that is approved/ covered by insurance pt advised that a message would be sent to provider for rx change

## 2024-07-31 NOTE — Telephone Encounter (Signed)
 Error

## 2024-07-31 NOTE — Telephone Encounter (Signed)
 Called pt to confirm Rx change request lvm to call back to confirm or deny Rx change

## 2024-08-04 ENCOUNTER — Telehealth: Payer: Self-pay

## 2024-08-04 DIAGNOSIS — N3281 Overactive bladder: Secondary | ICD-10-CM

## 2024-08-04 MED ORDER — SOLIFENACIN SUCCINATE 5 MG PO TABS: 5.0000 mg | ORAL_TABLET | Freq: Every day | ORAL | 3 refills | Status: AC

## 2024-08-04 NOTE — Telephone Encounter (Signed)
-----   Message from Donnice Brooks sent at 08/03/2024  5:30 PM EDT ----- Discontinue trospium  and send in solifenacin  5 mg, one po daily, #90 and 3 refill - thank you ----- Message ----- From: Sammie Exie HERO, CMA Sent: 07/31/2024  12:24 PM EDT To: Donnice Brooks, MD  Rx change request for insurance purpose approved Rx is oxybutynin or solifenacin  please change Rx  if appropriate

## 2024-08-16 DIAGNOSIS — Z9981 Dependence on supplemental oxygen: Secondary | ICD-10-CM | POA: Diagnosis not present

## 2024-08-16 DIAGNOSIS — I10 Essential (primary) hypertension: Secondary | ICD-10-CM | POA: Diagnosis not present

## 2024-08-16 DIAGNOSIS — G8929 Other chronic pain: Secondary | ICD-10-CM | POA: Diagnosis not present

## 2024-08-16 DIAGNOSIS — J449 Chronic obstructive pulmonary disease, unspecified: Secondary | ICD-10-CM | POA: Diagnosis not present

## 2024-08-16 DIAGNOSIS — E119 Type 2 diabetes mellitus without complications: Secondary | ICD-10-CM | POA: Diagnosis not present

## 2024-08-16 DIAGNOSIS — N289 Disorder of kidney and ureter, unspecified: Secondary | ICD-10-CM | POA: Diagnosis not present

## 2024-08-16 DIAGNOSIS — E538 Deficiency of other specified B group vitamins: Secondary | ICD-10-CM | POA: Diagnosis not present

## 2024-08-16 DIAGNOSIS — E785 Hyperlipidemia, unspecified: Secondary | ICD-10-CM | POA: Diagnosis not present

## 2024-08-24 DIAGNOSIS — H409 Unspecified glaucoma: Secondary | ICD-10-CM | POA: Diagnosis not present

## 2024-08-24 DIAGNOSIS — Z008 Encounter for other general examination: Secondary | ICD-10-CM | POA: Diagnosis not present

## 2024-08-24 DIAGNOSIS — E785 Hyperlipidemia, unspecified: Secondary | ICD-10-CM | POA: Diagnosis not present

## 2024-08-24 DIAGNOSIS — I129 Hypertensive chronic kidney disease with stage 1 through stage 4 chronic kidney disease, or unspecified chronic kidney disease: Secondary | ICD-10-CM | POA: Diagnosis not present

## 2024-08-24 DIAGNOSIS — N1832 Chronic kidney disease, stage 3b: Secondary | ICD-10-CM | POA: Diagnosis not present

## 2024-11-03 ENCOUNTER — Ambulatory Visit: Admitting: Urology

## 2024-11-03 ENCOUNTER — Telehealth: Payer: Self-pay

## 2024-11-03 NOTE — Telephone Encounter (Signed)
 Patient's appointment was cancelled for today and was rescheduled to January.   Wanting to make sure medications would be refilled when needed.  Please advise.

## 2024-11-03 NOTE — Telephone Encounter (Signed)
 Return call to pt. Pt state's he want refills for Trospium . Relayed MD recommendation agin to pt   Discontinue trospium  and send in solifenacin  5 mg, one po daily, #90 and 3 refill - thank you voiced undrstanding

## 2024-11-13 NOTE — Progress Notes (Deleted)
 " HPI- M never smoker followed for Chronic Hypoxic Respiratory Failure, Allergic rhinitis, OSA, morbid obesity, obesity/ hypoventilation, complicated by back pain, DM, HBP, glaucoma, pulmonary hypertension( Cardiology) ACE level -08/05/11- WNL 24 ACE level 01/15/21- 14 (9-67) NPSG 09/22/14- AHI 72.7/ hr, CPAP to 19, weight 332 lbs PFT 10/21/16-  severe obstruction and restriction with diffusion relatively high for alveolar volume Echocardiogram 10/12/16- BNP 10/09/17- 20.5   WNL O2 qualifying Walk Test 08/07/20-- desat to 87% on rrom air, 94% on 3L HRCT chest 04/08/20- post infectious or inflammatory scarring and volume loss. There are no specific findings to suggest fibrotic interstitial lung disease.  There are numerous prominent, hyperdense, although not pathologically enlarged mediastinal and hilar lymph nodes, unchanged compared to prior examination and most consistent with nodal involvement of sarcoidosis, although occupational inhalational lung disease may also have this appearance. 4. Coronary artery disease. Aortic Atherosclerosis (ICD10-I70.0). R Heart Cath 07/13/22- severe PAH ? Group 3, possible group 1 component>continue O2, CPAP but consider Tyvaso. 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 Download- compliance 97%, AHI 4.8/ hr Body weight today 288 lbs Covid vax- 3 Moderna Flu vax -  Pulmonary Rehab ------Pt is doing well Download reviewed.  Doing well with CPAP plus oxygen  at night. Does not feel that his breathing is any  different-certainly no worse. He has not been able to lose weight which is a major factor.  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 Download- compliance 100%, AHI 3.9/hr Body weight today -290 lbs Discussed the use of AI scribe software for clinical note transcription with the patient, who gave verbal consent to proceed.  History of Present Illness   Colton Jackson. is a 73 year old male who presents with CPAP supply issues.  He is experiencing difficulty obtaining CPAP supplies due to financial constraints, but maintains consistent use of his CPAP machine. He experiences between three and four breakthrough apneas per hour on his current CPAP therapy. His oxygen  supply is provided by [employer], the same company responsible for his CPAP supplies. No chest pain, shortness of breath, or palpitations. His chronic respiratory failure with hypoxia is stable. He uses home O2 at 6l, including through CPAP. He uses a portable tank for continuous flow at 2L.     Assessment and Plan:    Obstructive Sleep Apnea Obstructive sleep apnea controlled with CPAP therapy, 3-4 apneas/hour. Billing issue affecting CPAP supply acquisition. - Contact [employer] to confirm CPAP supply needs.   Chronic Respiratory Failure with Hypoxia - Order chest X-ray to update imaging. -Continue current oxygen    Morbid Obesity No progress  CXR 02/24/23 MPRESSION: No active cardiopulmonary disease.   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 Download- compliance  Body weight today   CXR 05/16/24 MPRESSION: No active cardiopulmonary disease.  ROS-see HPI   += positive Constitutional:    weight loss, night sweats, fevers, chills, fatigue, lassitude. HEENT:    headaches, difficulty swallowing, tooth/dental problems, sore throat,       sneezing, itching, ear ache, nasal congestion, post nasal drip, snoring CV:    chest pain, orthopnea, PND, swelling in lower extremities, anasarca,                                   dizziness, palpitations Resp:  + shortness of breath with exertion or at rest.                productive cough,   non-productive cough, coughing up of blood.              change in color of mucus.  wheezing.   Skin:    rash or lesions. GI:  No-   heartburn, indigestion, abdominal pain, nausea, vomiting, diarrhea,                 change in bowel habits, loss of appetite GU: dysuria, change in color of urine, no urgency or frequency.   flank pain. MS:   +joint pain, stiffness, decreased range of motion, back pain. Neuro-     nothing unusual Psych:  change in mood or affect.  depression or anxiety.   memory loss.  OBJ- Physical Exam     His O2 2L  + morbidly obese, +on O2 2L tank General- Alert, Oriented, Affect-appropriate, Distress- none acute, +morbid Obesity Skin- rash-none, lesions- none, excoriation- none Lymphadenopathy- none Head- atraumatic            Eyes- Gross vision intact, PERRLA, conjunctivae and secretions clear            Ears- Hearing, canals-normal            Nose- Clear, no-Septal dev, mucus, polyps, erosion, perforation             Throat- Mallampati III , mucosa clear , drainage- none, tonsils- atrophic Neck- flexible  , trachea midline, no stridor , thyroid  nl, carotid no bruit Chest - symmetrical excursion , unlabored           Heart/CV- RRR , no murmur , no gallop  , no rub, nl s1 s2                           - JVD- none , edema- none, stasis changes- none, varices- none           Lung- clear/distant- I don't hear rales or crackles., wheeze- none, cough- none , dullness-none, rub- none           Chest wall-  Abd-  Br/ Gen/ Rectal- Not done, not indicated Extrem- cyanosis- none, clubbing, none, atrophy- none, strength- nl Neuro- grossly intact to observation       "

## 2024-11-14 ENCOUNTER — Ambulatory Visit: Admitting: Internal Medicine

## 2024-11-20 ENCOUNTER — Encounter

## 2024-11-21 ENCOUNTER — Encounter

## 2024-12-04 ENCOUNTER — Ambulatory Visit: Admitting: Urology
